# Patient Record
Sex: Male | Born: 1945
Health system: Southern US, Community
[De-identification: ages and names within clinical notes are randomized; demographics above are authoritative.]

## PROBLEM LIST (undated history)

## (undated) DIAGNOSIS — B351 Tinea unguium: Secondary | ICD-10-CM

## (undated) DIAGNOSIS — I10 Essential (primary) hypertension: Secondary | ICD-10-CM

## (undated) DIAGNOSIS — K219 Gastro-esophageal reflux disease without esophagitis: Secondary | ICD-10-CM

## (undated) DIAGNOSIS — Z8719 Personal history of other diseases of the digestive system: Secondary | ICD-10-CM

## (undated) DIAGNOSIS — I499 Cardiac arrhythmia, unspecified: Secondary | ICD-10-CM

## (undated) DIAGNOSIS — H9190 Unspecified hearing loss, unspecified ear: Secondary | ICD-10-CM

## (undated) DIAGNOSIS — H35039 Hypertensive retinopathy, unspecified eye: Secondary | ICD-10-CM

## (undated) DIAGNOSIS — C801 Malignant (primary) neoplasm, unspecified: Secondary | ICD-10-CM

## (undated) DIAGNOSIS — H269 Unspecified cataract: Secondary | ICD-10-CM

## (undated) DIAGNOSIS — B009 Herpesviral infection, unspecified: Secondary | ICD-10-CM

## (undated) DIAGNOSIS — M549 Dorsalgia, unspecified: Secondary | ICD-10-CM

## (undated) DIAGNOSIS — E039 Hypothyroidism, unspecified: Secondary | ICD-10-CM

## (undated) DIAGNOSIS — J189 Pneumonia, unspecified organism: Secondary | ICD-10-CM

## (undated) DIAGNOSIS — E785 Hyperlipidemia, unspecified: Secondary | ICD-10-CM

## (undated) HISTORY — PX: OTHER SURGICAL HISTORY: SHX169

## (undated) HISTORY — PX: COSMETIC SURGERY: SHX468

## (undated) HISTORY — DX: Tinea unguium: B35.1

## (undated) HISTORY — DX: Hypertensive retinopathy, unspecified eye: H35.039

## (undated) HISTORY — DX: Hyperlipidemia, unspecified: E78.5

## (undated) HISTORY — DX: Dorsalgia, unspecified: M54.9

## (undated) HISTORY — PX: LASIK: SHX215

## (undated) HISTORY — PX: EYE SURGERY: SHX253

## (undated) HISTORY — PX: WISDOM TOOTH EXTRACTION: SHX21

## (undated) HISTORY — DX: Essential (primary) hypertension: I10

## (undated) HISTORY — PX: COLONOSCOPY: SHX174

---
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
## 1979-07-06 HISTORY — PX: VASECTOMY: SHX75

## 2005-10-19 ENCOUNTER — Ambulatory Visit: Payer: Self-pay | Admitting: Internal Medicine

## 2007-01-20 DIAGNOSIS — E785 Hyperlipidemia, unspecified: Secondary | ICD-10-CM

## 2007-01-20 DIAGNOSIS — I10 Essential (primary) hypertension: Secondary | ICD-10-CM

## 2007-01-25 ENCOUNTER — Ambulatory Visit: Payer: Self-pay | Admitting: Internal Medicine

## 2007-01-25 LAB — CONVERTED CEMR LAB
Bilirubin, Direct: 0.1 mg/dL (ref 0.0–0.3)
Cholesterol: 230 mg/dL (ref 0–200)
Direct LDL: 149.6 mg/dL
HDL: 43.2 mg/dL (ref >39.0)
Total CHOL/HDL Ratio: 5.3
Total Protein: 7 g/dL (ref 6.0–8.3)
Triglycerides: 148 mg/dL (ref 0–149)

## 2007-02-01 ENCOUNTER — Ambulatory Visit: Payer: Self-pay | Admitting: Internal Medicine

## 2007-02-01 DIAGNOSIS — B351 Tinea unguium: Secondary | ICD-10-CM

## 2007-02-01 DIAGNOSIS — H906 Mixed conductive and sensorineural hearing loss, bilateral: Secondary | ICD-10-CM | POA: Insufficient documentation

## 2007-02-01 DIAGNOSIS — R413 Other amnesia: Secondary | ICD-10-CM | POA: Insufficient documentation

## 2007-02-01 DIAGNOSIS — M549 Dorsalgia, unspecified: Secondary | ICD-10-CM | POA: Insufficient documentation

## 2007-02-22 ENCOUNTER — Encounter: Payer: Self-pay | Admitting: Internal Medicine

## 2007-03-31 ENCOUNTER — Ambulatory Visit: Payer: Self-pay | Admitting: Internal Medicine

## 2007-03-31 LAB — CONVERTED CEMR LAB
Alkaline Phosphatase: 69 units/L (ref 39–117)
BUN: 10 mg/dL (ref 6–23)
Basophils Absolute: 0 10*3/uL (ref 0.0–0.1)
Basophils Relative: 0.3 % (ref 0.0–1.0)
Bilirubin Urine: NEGATIVE
Bilirubin, Direct: 0.1 mg/dL (ref 0.0–0.3)
CO2: 29 meq/L (ref 19–32)
Cholesterol: 220 mg/dL (ref 0–200)
Direct LDL: 130.6 mg/dL
GFR calc Af Amer: 98 mL/min
Glucose, Urine, Semiquant: NEGATIVE
HCT: 43.2 % (ref 39.0–52.0)
Hemoglobin: 15.2 g/dL (ref 13.0–17.0)
Lymphocytes Relative: 25.7 % (ref 12.0–46.0)
MCHC: 35.3 g/dL (ref 30.0–36.0)
Monocytes Absolute: 0.3 10*3/uL (ref 0.2–0.7)
Monocytes Relative: 6.9 % (ref 3.0–11.0)
Neutro Abs: 3.3 10*3/uL (ref 1.4–7.7)
Neutrophils Relative %: 64.5 % (ref 43.0–77.0)
PSA: 1.53 ng/mL (ref 0.10–4.00)
Potassium: 4.5 meq/L (ref 3.5–5.1)
Sodium: 140 meq/L (ref 135–145)
TSH: 1.88 microintl units/mL (ref 0.35–5.50)
Total Bilirubin: 1.2 mg/dL (ref 0.3–1.2)
Total Protein: 6.7 g/dL (ref 6.0–8.3)
pH: 5.5

## 2007-04-04 ENCOUNTER — Ambulatory Visit: Payer: Self-pay | Admitting: Internal Medicine

## 2007-04-12 ENCOUNTER — Ambulatory Visit: Payer: Self-pay | Admitting: Gastroenterology

## 2007-04-26 ENCOUNTER — Ambulatory Visit: Payer: Self-pay | Admitting: Gastroenterology

## 2007-04-26 ENCOUNTER — Encounter: Payer: Self-pay | Admitting: Internal Medicine

## 2007-09-18 ENCOUNTER — Ambulatory Visit: Payer: Self-pay | Admitting: Internal Medicine

## 2007-09-18 LAB — CONVERTED CEMR LAB
ALT: 27 units/L (ref 0–53)
AST: 23 units/L (ref 0–37)
Albumin: 4.4 g/dL (ref 3.5–5.2)
Alkaline Phosphatase: 67 units/L (ref 39–117)
Total Bilirubin: 1.1 mg/dL (ref 0.3–1.2)
Total Protein: 7.2 g/dL (ref 6.0–8.3)
Triglycerides: 122 mg/dL (ref 0–149)

## 2007-10-05 ENCOUNTER — Ambulatory Visit: Payer: Self-pay | Admitting: Internal Medicine

## 2007-10-05 DIAGNOSIS — C44309 Unspecified malignant neoplasm of skin of other parts of face: Secondary | ICD-10-CM | POA: Insufficient documentation

## 2007-10-05 DIAGNOSIS — F528 Other sexual dysfunction not due to a substance or known physiological condition: Secondary | ICD-10-CM

## 2008-01-31 ENCOUNTER — Ambulatory Visit: Payer: Self-pay | Admitting: Internal Medicine

## 2008-01-31 LAB — CONVERTED CEMR LAB
AST: 28 units/L (ref 0–37)
Alkaline Phosphatase: 71 units/L (ref 39–117)
Bilirubin, Direct: 0.2 mg/dL (ref 0.0–0.3)
Total Bilirubin: 1 mg/dL (ref 0.3–1.2)
Total CHOL/HDL Ratio: 4.8
VLDL: 21 mg/dL (ref 0–40)

## 2008-02-14 ENCOUNTER — Ambulatory Visit: Payer: Self-pay | Admitting: Internal Medicine

## 2008-02-14 LAB — CONVERTED CEMR LAB
HDL goal, serum: 40 mg/dL
LDL Goal: 100 mg/dL

## 2008-04-25 ENCOUNTER — Encounter: Payer: Self-pay | Admitting: Internal Medicine

## 2008-05-20 ENCOUNTER — Ambulatory Visit: Payer: Self-pay | Admitting: Internal Medicine

## 2008-05-20 LAB — CONVERTED CEMR LAB
ALT: 33 units/L (ref 0–53)
AST: 30 units/L (ref 0–37)
Cholesterol: 184 mg/dL (ref 0–200)
HDL: 48.4 mg/dL (ref >39.0)
Total Bilirubin: 0.8 mg/dL (ref 0.3–1.2)
Total Protein: 6.9 g/dL (ref 6.0–8.3)
VLDL: 7 mg/dL (ref 0–40)

## 2008-05-27 ENCOUNTER — Ambulatory Visit: Payer: Self-pay | Admitting: Internal Medicine

## 2008-05-27 DIAGNOSIS — E039 Hypothyroidism, unspecified: Secondary | ICD-10-CM | POA: Insufficient documentation

## 2008-11-06 ENCOUNTER — Telehealth: Payer: Self-pay | Admitting: Internal Medicine

## 2008-11-08 ENCOUNTER — Telehealth: Payer: Self-pay | Admitting: Internal Medicine

## 2008-11-19 ENCOUNTER — Ambulatory Visit: Payer: Self-pay | Admitting: Internal Medicine

## 2008-11-19 LAB — CONVERTED CEMR LAB
ALT: 47 units/L (ref 0–53)
Albumin: 4 g/dL (ref 3.5–5.2)
Basophils Relative: 0.5 % (ref 0.0–3.0)
Bilirubin Urine: NEGATIVE
Bilirubin, Direct: 0 mg/dL (ref 0.0–0.3)
CO2: 28 meq/L (ref 19–32)
Chloride: 104 meq/L (ref 96–112)
Cholesterol: 220 mg/dL — ABNORMAL HIGH (ref 0–200)
Eosinophils Absolute: 0 10*3/uL (ref 0.0–0.7)
Eosinophils Relative: 1 % (ref 0.0–5.0)
Glucose, Urine, Semiquant: NEGATIVE
HCT: 48.2 % (ref 39.0–52.0)
Lymphs Abs: 1.4 10*3/uL (ref 0.7–4.0)
MCHC: 34.5 g/dL (ref 30.0–36.0)
MCV: 86 fL (ref 78.0–100.0)
Monocytes Absolute: 0.3 10*3/uL (ref 0.1–1.0)
Neutro Abs: 3.2 10*3/uL (ref 1.4–7.7)
PSA: 2.06 ng/mL (ref 0.10–4.00)
Potassium: 4.1 meq/L (ref 3.5–5.1)
Protein, U semiquant: NEGATIVE
RBC: 5.6 M/uL (ref 4.22–5.81)
TSH: 0.01 microintl units/mL — ABNORMAL LOW (ref 0.35–5.50)
Total CHOL/HDL Ratio: 4
Total Protein: 7.3 g/dL (ref 6.0–8.3)
Urobilinogen, UA: 0.2
VLDL: 29 mg/dL (ref 0.0–40.0)
WBC Urine, dipstick: NEGATIVE
WBC: 4.9 10*3/uL (ref 4.5–10.5)
pH: 7

## 2008-11-25 ENCOUNTER — Ambulatory Visit: Payer: Self-pay | Admitting: Internal Medicine

## 2008-12-25 ENCOUNTER — Encounter: Payer: Self-pay | Admitting: Internal Medicine

## 2008-12-25 ENCOUNTER — Ambulatory Visit: Payer: Self-pay | Admitting: Internal Medicine

## 2008-12-25 DIAGNOSIS — C44599 Other specified malignant neoplasm of skin of other part of trunk: Secondary | ICD-10-CM

## 2009-01-02 ENCOUNTER — Encounter: Payer: Self-pay | Admitting: Internal Medicine

## 2009-01-02 ENCOUNTER — Ambulatory Visit: Payer: Self-pay | Admitting: Internal Medicine

## 2009-10-10 ENCOUNTER — Telehealth: Payer: Self-pay | Admitting: Internal Medicine

## 2009-10-14 ENCOUNTER — Telehealth: Payer: Self-pay | Admitting: Internal Medicine

## 2009-10-30 LAB — CONVERTED CEMR LAB
HDL: 54 mg/dL
LDL Cholesterol: 128 mg/dL
Triglycerides: 86 mg/dL

## 2010-03-23 ENCOUNTER — Telehealth: Payer: Self-pay | Admitting: Internal Medicine

## 2010-03-30 ENCOUNTER — Ambulatory Visit: Payer: Self-pay | Admitting: Internal Medicine

## 2010-03-30 LAB — CONVERTED CEMR LAB
ALT: 35 units/L (ref 0–53)
Alkaline Phosphatase: 83 units/L (ref 39–117)
Bilirubin, Direct: 0.2 mg/dL (ref 0.0–0.3)
Direct LDL: 120.6 mg/dL
HDL: 59.7 mg/dL (ref >39.00)
Total Bilirubin: 1.4 mg/dL — ABNORMAL HIGH (ref 0.3–1.2)

## 2010-04-06 ENCOUNTER — Ambulatory Visit: Payer: Self-pay | Admitting: Internal Medicine

## 2010-07-07 ENCOUNTER — Encounter: Payer: Self-pay | Admitting: Internal Medicine

## 2010-07-07 ENCOUNTER — Ambulatory Visit
Admission: RE | Admit: 2010-07-07 | Discharge: 2010-07-07 | Payer: Self-pay | Source: Home / Self Care | Attending: Internal Medicine | Admitting: Internal Medicine

## 2010-07-07 LAB — CONVERTED CEMR LAB
Sex Hormone Binding: 58 nmol/L (ref 13–71)
Testosterone Free: 138.5 pg/mL (ref 47.0–244.0)
Testosterone-% Free: 1.6 % (ref 1.6–2.9)
Testosterone: 867.6 ng/dL (ref 250–890)
Vit D, 25-Hydroxy: 90 ng/mL — ABNORMAL HIGH (ref 30–89)

## 2010-07-13 ENCOUNTER — Ambulatory Visit
Admission: RE | Admit: 2010-07-13 | Discharge: 2010-07-13 | Payer: Self-pay | Source: Home / Self Care | Attending: Internal Medicine | Admitting: Internal Medicine

## 2010-07-13 DIAGNOSIS — R972 Elevated prostate specific antigen [PSA]: Secondary | ICD-10-CM | POA: Insufficient documentation

## 2010-08-03 ENCOUNTER — Other Ambulatory Visit: Payer: Self-pay | Admitting: Internal Medicine

## 2010-08-03 ENCOUNTER — Encounter: Payer: Self-pay | Admitting: Internal Medicine

## 2010-08-03 ENCOUNTER — Ambulatory Visit
Admission: RE | Admit: 2010-08-03 | Discharge: 2010-08-03 | Payer: Self-pay | Source: Home / Self Care | Attending: Internal Medicine | Admitting: Internal Medicine

## 2010-08-04 NOTE — Assessment & Plan Note (Signed)
Summary: F/U ON LABWORK // RS   Vital Signs:  Patient profile:   65 year old male Weight:      171 pounds BMI:     24.62 Temp:     97.9 degrees F oral Pulse rate:   74 / minute Pulse rhythm:   regular Resp:     12 per minute BP sitting:   138 / 78  Vitals Entered By: Lynann Beaver CMA (April 06, 2010 3:16 PM) CC: f/o labs, Lipid Management   Primary Care Provider:  Stacie Glaze MD  CC:  f/o labs and Lipid Management.  History of Present Illness: follow up on the lipids follow up on the testosteron levels and the supplimentation  wit the ciprionate added hearing aids  Hyperlipidemia Follow-Up      This is a 65 year old man who presents for Hyperlipidemia follow-up.  The patient denies muscle aches, GI upset, abdominal pain, flushing, itching, constipation, diarrhea, and fatigue.  The patient denies the following symptoms: chest pain/pressure, exercise intolerance, dypsnea, palpitations, syncope, and pedal edema.  Compliance with medications (by patient report) has been intermittent.  Dietary compliance has been good.  The patient reports exercising 3-4X per week.  Adjunctive measures currently used by the patient include fish oil supplements and weight reduction.    Lipid Management History:      Positive NCEP/ATP III risk factors include male age 50 years old or older and hypertension.  Negative NCEP/ATP III risk factors include no family history for ischemic heart disease, non-tobacco-user status, no ASHD (atherosclerotic heart disease), no prior stroke/TIA, no peripheral vascular disease, and no history of aortic aneurysm.     Preventive Screening-Counseling & Management  Alcohol-Tobacco     Smoking Status: never     Tobacco Counseling: not indicated; no tobacco use  Problems Prior to Update: 1)  Other Malig Neoplasm Skin Trunk Except Scrotum  (ICD-173.5) 2)  Hypothyroidism, Borderline  (ICD-244.9) 3)  Skin Tag  (ICD-701.9) 4)  Carcinoma, Basal Cell, Nose   (ICD-173.3) 5)  Erectile Dysfunction, Mild  (ICD-302.72) 6)  Preventive Health Care  (ICD-V70.0) 7)  Loss, Mixed Hearing, Bilateral  (ICD-389.22) 8)  Dermatophytosis, Nail  (ICD-110.1) 9)  Symptom, Memory Loss  (ICD-780.93) 10)  Back Pain  (ICD-724.5) 11)  Hypertension  (ICD-401.9) 12)  Hyperlipidemia  (ICD-272.4)  Medications Prior to Update: 1)  Co Q 10 10 Mg  Caps (Coenzyme Q10) .... Once Daily 2)  Zinc 50 Mg  Caps (Zinc) .... Once Daily 3)  Vitamin B-12 Cr 1500 Mcg  Tbcr (Cyanocobalamin) .... Once Daily 4)  Vitamin C 1000 Mg  Tabs (Ascorbic Acid) .... Once Daily 5)  Omega 3 1500 .... Two Times A Day 6)  Niacin Cr 1000 Mg  Tbcr (Niacin) .... Once Daily 7)  Red Yeast Rice Extract 600 Mg  Caps (Red Yeast Rice Extract) .... Once Daily 8)  Vitamin D 1000 Unit  Caps (Cholecalciferol) .... 2 Two Times A Day 9)  Glucosamine Complex 200-300 Mg  Tabs (Glucosamine Hcl-Glucosamin So4) .... Two Times A Day 10)  Vitamin E 400 Unit  Caps (Vitamin E) .... Once Daily 11)  Lovaza 1 Gm  Caps (Omega-3-Acid Ethyl Esters) .... Two Twice By Mouth Bid 12)  Hydrocortisone 5 Mg Tabs (Hydrocortisone) .... 2 Two Times A Day 13)  T-86mcg .Marland Kitchen.. 1 3 Times A Week 14)  T 4:t3 2grain 76/18 Mcg .Marland Kitchen.. 3 Times A Week 15)  Dhea 1500 Mg 1 Qd 16)  Arimidex 1 Mg  Tabs (Anastrozole) .... 1/2 3 Times A Week 17)  Testosterone .25 Cc 2 Times A Week 18)  Amitriptyline Hcl 10 Mg Tabs (Amitriptyline Hcl) .... Q Hs As Needed Sleep  Current Medications (verified): 1)  Co Q 10 10 Mg  Caps (Coenzyme Q10) .... Once Daily 2)  Zinc 50 Mg  Caps (Zinc) .... Once Daily 3)  Vitamin B-12 Cr 1500 Mcg  Tbcr (Cyanocobalamin) .... Once Daily 4)  Vitamin C 1000 Mg  Tabs (Ascorbic Acid) .... Once Daily 5)  Red Yeast Rice Extract 600 Mg  Caps (Red Yeast Rice Extract) .... Once Daily 6)  Vitamin D 1000 Unit  Caps (Cholecalciferol) .... 2 Two Times A Day 7)  Vitamin E 400 Unit  Caps (Vitamin E) .... Once Daily 8)  Lovaza 1 Gm  Caps (Omega-3-Acid  Ethyl Esters) .... Two Twice By Mouth Bid 9)  Dhea 1500 Mg 1 Qd 10)  Arimidex 1 Mg Tabs (Anastrozole) .... 1/2 Po 3 Times A Week 11)  Testosterone Cypionate 200 Mg/ml Oil (Testosterone Cypionate) .... 1/4 Cc Every Week 12)  Armour Thyroid 60 Mg Tabs (Thyroid) .... One By Mouth Q Day 13)  Milk Thistle 150 Mg Caps (Milk Thistle) .... 375 Mg Q Day  Allergies (verified): No Known Drug Allergies  Past History:  Family History: Last updated: 02/01/2007 father Family History Liver disease secondary to ETOH  mother CHF  Social History: Last updated: 02/01/2007 Married Former Smoker quit in 1983  Risk Factors: Smoking Status: never (04/06/2010) Passive Smoke Exposure: no (04/04/2007)  Past medical, surgical, family and social histories (including risk factors) reviewed, and no changes noted (except as noted below).  Past Medical History: Reviewed history from 02/01/2007 and no changes required. Hyperlipidemia Hypertension back pain fungal nail  Past Surgical History: Reviewed history from 02/01/2007 and no changes required. ankle surgery trauma eye lid surgery  Family History: Reviewed history from 02/01/2007 and no changes required. father Family History Liver disease secondary to ETOH  mother CHF  Social History: Reviewed history from 02/01/2007 and no changes required. Married Former Smoker quit in 1983Smoking Status:  never  Review of Systems  The patient denies anorexia, fever, weight loss, weight gain, vision loss, decreased hearing, hoarseness, chest pain, syncope, dyspnea on exertion, peripheral edema, prolonged cough, headaches, hemoptysis, abdominal pain, melena, hematochezia, severe indigestion/heartburn, hematuria, incontinence, genital sores, muscle weakness, suspicious skin lesions, transient blindness, difficulty walking, depression, unusual weight change, abnormal bleeding, enlarged lymph nodes, angioedema, and breast masses.    Physical  Exam  General:  Well-developed,well-nourished,in no acute distress; alert,appropriate and cooperative throughout examination Head:  Normocephalic and atraumatic without obvious abnormalities. No apparent alopecia or balding. Eyes:  pupils equal and pupils round.   Ears:  R ear normal and L ear normal.   Nose:  no external deformity.   Neck:  supple and full ROM.   Lungs:  normal respiratory effort and no wheezes.   Heart:  normal rate and regular rhythm.   Abdomen:  soft and non-tender.   Msk:  no joint tenderness and no joint swelling.   Extremities:  No clubbing, cyanosis, edema, or deformity noted with normal full range of motion of all joints.   Neurologic:  alert & oriented X3 and DTRs symmetrical and normal.     Impression & Recommendations:  Problem # 1:  HYPERTENSION (ICD-401.9) Assessment Unchanged  BP today: 138/78 Prior BP: 130/80 (01/02/2009)  10 Yr Risk Heart Disease: 14 % Prior 10 Yr Risk Heart Disease: Not enough information (02/14/2008)  Labs Reviewed: K+: 4.1 (11/19/2008) Creat: : 1.0 (11/19/2008)   Chol: 207 (03/30/2010)   HDL: 59.70 (03/30/2010)   LDL: 128 (10/30/2009)   TG: 96.0 (03/30/2010)  Problem # 2:  HYPERLIPIDEMIA (ICD-272.4) Assessment: Deteriorated not following plan adding fish oil  and discussed dash diet The following medications were removed from the medication list:    Niacin Cr 1000 Mg Tbcr (Niacin) ..... Once daily His updated medication list for this problem includes:    Lovaza 1 Gm Caps (Omega-3-acid ethyl esters) .Marland Kitchen..Marland Kitchen Two twice by mouth bid  Labs Reviewed: SGOT: 26 (03/30/2010)   SGPT: 35 (03/30/2010)  Lipid Goals: Chol Goal: 200 (02/14/2008)   HDL Goal: 40 (02/14/2008)   LDL Goal: 100 (02/14/2008)   TG Goal: 150 (02/14/2008)  10 Yr Risk Heart Disease: 14 % Prior 10 Yr Risk Heart Disease: Not enough information (02/14/2008)   HDL:59.70 (03/30/2010), 54 (10/30/2009)  LDL:128 (10/30/2009), 128 (05/20/2008)  Chol:207 (03/30/2010),  205 (10/30/2009)  Trig:96.0 (03/30/2010), 86 (10/30/2009)  Problem # 3:  ERECTILE DYSFUNCTION, MILD (ICD-302.72)  Discussed proper use of medications, as well as side effects.   Problem # 4:  HYPOTHYROIDISM, BORDERLINE (ICD-244.9) refill His updated medication list for this problem includes:    Armour Thyroid 60 Mg Tabs (Thyroid) ..... One by mouth q day  Labs Reviewed: TSH: 1.10 (03/30/2010)    Chol: 207 (03/30/2010)   HDL: 59.70 (03/30/2010)   LDL: 128 (10/30/2009)   TG: 96.0 (03/30/2010)  Complete Medication List: 1)  Co Q 10 10 Mg Caps (Coenzyme q10) .... Once daily 2)  Zinc 50 Mg Caps (Zinc) .... Once daily 3)  Vitamin B-12 Cr 1500 Mcg Tbcr (Cyanocobalamin) .... Once daily 4)  Vitamin C 1000 Mg Tabs (Ascorbic acid) .... Once daily 5)  Red Yeast Rice Extract 600 Mg Caps (Red yeast rice extract) .... Once daily 6)  Vitamin D 1000 Unit Caps (Cholecalciferol) .... 2 two times a day 7)  Vitamin E 400 Unit Caps (Vitamin e) .... Once daily 8)  Lovaza 1 Gm Caps (Omega-3-acid ethyl esters) .... Two twice by mouth bid 9)  Dhea 1500 Mg 1 Qd  10)  Arimidex 1 Mg Tabs (Anastrozole) .... 1/2 po 3 times a week 11)  Testosterone Cypionate 200 Mg/ml Oil (Testosterone cypionate) .... 1/4 cc every week 12)  Armour Thyroid 60 Mg Tabs (Thyroid) .... One by mouth q day 13)  Milk Thistle 150 Mg Caps (Milk thistle) .... 375 mg q day  Lipid Assessment/Plan:      Based on NCEP/ATP III, the patient's risk factor category is "0-1 risk factors".  The patient's lipid goals are as follows: Total cholesterol goal is 200; LDL cholesterol goal is 100; HDL cholesterol goal is 40; Triglyceride goal is 150.    Patient Instructions: 1)  Please schedule a follow-up appointment in 3 months. 2)  Testosterone and free testosterone 3)  estrogen       607.84 4)  PSA  790.4 5)  vit D levels  733.00 Prescriptions: ARMOUR THYROID 60 MG TABS (THYROID) ac qid  #30 x 11   Entered and Authorized by:   Stacie Glaze MD    Signed by:   Stacie Glaze MD on 04/06/2010   Method used:   Elect[facility] to        [facility]  [address] [phone]* (retail)       [address]       [place], [place]  [address]  Ph: [phone] or [phone]       Fax: [phone]   RxID:   [id number] ARIMIDEX 1 MG TABS (ANASTROZOLE) 1/2 po 3 times a week  #12 x 3   Entered and Authorized by:   Stacie Glaze MD   Signed by:   Stacie Glaze MD on 04/06/2010   Method used:   [facility] to        [facility]  [address] [phone][facility] (retail)       [address] [place], [place]  [address]       Ph: [phone] or [phone]       Fax: [phone]   RxID:   [id number] TESTOSTERONE CYPIONATE 200 MG/ML OIL (TESTOSTERONE CYPIONATE) 1/4 cc every week  #10cc x 6   Entered and Authorized by:   Stacie Glaze MD   Signed by:   Stacie Glaze MD on 04/06/2010   Method used:   Print then Give to Patient   RxID:   [id number]

## 2010-08-04 NOTE — Progress Notes (Signed)
Summary: Pt is req to get Liver,Lipids and Thyroid checked  Phone Note Call from Patient Call back at Home Phone [phone]   Caller: Patient Reason for Call: Acute Illness Summary of Call: Pt called and said that he needs to sch labs to get liver and lipids recheck and ? thyroid. Pls advise.  Initial call taken by: Lucy Antigua,  March 23, 2010 9:14 AM  Follow-up for Phone Call        lipid,liver 272.4 and tsh 244.9 may be ordered and ov 1 week later  Follow-up by: Willy Eddy, LPN,  March 23, 2010 9:33 AM  Additional Follow-up for Phone Call Additional follow up Details #1::        Contacted pt and scheduled appt for labwork on  9/26  and then f/u with Dr Lovell Sheehan on  10/3.  Additional Follow-up by: Debbra Riding,  March 23, 2010 10:20 AM

## 2010-08-04 NOTE — Letter (Signed)
Summary: [facility]  [facility]   Imported By: Maryln Gottron 04/02/2010 14:45:40  _____________________________________________________________________  External Attachment:    Type:   Image     Comment:   External Document

## 2010-08-04 NOTE — Progress Notes (Signed)
Summary: lovaza  Phone Note Refill Request Message from:  Fax from Pharmacy on October 10, 2009 3:33 PM  Refills Requested: Medication #1:  LOVAZA 1 GM  CAPS two twice by mouth BID Initial call taken by: Kern Reap CMA Duncan Dull),  October 10, 2009 3:34 PM    Prescriptions: LOVAZA 1 GM  CAPS (OMEGA-3-ACID ETHYL ESTERS) two twice by mouth BID  #120 x 11   Entered by:   Kern Reap CMA (AAMA)   Authorized by:   Stacie Glaze MD   Signed by:   Kern Reap CMA (AAMA) on 10/10/2009   Method used:   Electronically to        [facility]* (mail-order)             ,          Ph: [phone]       Fax: [phone]   RxID:   [id number]

## 2010-08-04 NOTE — Progress Notes (Signed)
Summary: REQ FOR RX TO BE RESENT TO [facility] (LOVAZA)  Phone Note Call from Patient   Caller: Spouse  [phone] Reason for Call: Refill Medication Summary of Call: Pts wife [name]) called to adv that [facility] is telling them that they have not received refill Rx for med: LOVAZA 1 GM  ..... EMR is showing same was sent on 10/10/2009.... Pts wife req that Rx be resent to [facility].  Pts wife can be reached at [phone] with any questions or concerns.  Initial call taken by: Debbra Riding,  October 14, 2009 10:03 AM    Prescriptions: LOVAZA 1 GM  CAPS (OMEGA-3-ACID ETHYL ESTERS) two twice by mouth BID  #120 x 11   Entered by:   Willy Eddy, LPN   Authorized by:   Stacie Glaze MD   Signed by:   Willy Eddy, LPN on 29/56/2130   Method used:   Electronically to        [facility]* (mail-order)             ,          Ph: [phone]       Fax: [phone]   RxID:   [id number]   Appended Document: REQ FOR RX TO BE RESENT TO [facility] (LOVAZA) sent again

## 2010-08-06 NOTE — Assessment & Plan Note (Signed)
Summary: 3 month follow up/cjr   Vital Signs:  Patient profile:   65 year old male Height:      70 inches Weight:      171 pounds BMI:     24.62 Temp:     98.3 degrees F oral Pulse rate:   84 / minute Resp:     14 per minute BP sitting:   144 / 80  (left arm)  Vitals Entered By: Willy Eddy, LPN (July 13, 2010 10:40 AM) CC: elevated liver enzymeroa labs, Lipid Management Is Patient Diabetic? No   Primary Care Provider:  Stacie Glaze MD  CC:  elevated liver enzymeroa labs and Lipid Management.  History of Present Illness: monitering of PSA with hx of prostate cancer  ( see urology note) Note he is on testosterone HTN controlled long discussion with pt ans wife plan for monitering psa and risks of CA hyperlipidemia stable no fm hx of prostate ca  Lipid Management History:      Positive NCEP/ATP III risk factors include male age 36 years old or older and hypertension.  Negative NCEP/ATP III risk factors include no family history for ischemic heart disease, non-tobacco-user status, no ASHD (atherosclerotic heart disease), no prior stroke/TIA, no peripheral vascular disease, and no history of aortic aneurysm.    Preventive Screening-Counseling & Management  Alcohol-Tobacco     Smoking Status: never     Passive Smoke Exposure: no     Tobacco Counseling: not indicated; no tobacco use  Problems Prior to Update: 1)  Prostate Specific Antigen, Elevated  (ICD-790.93) 2)  Other Malig Neoplasm Skin Trunk Except Scrotum  (ICD-173.5) 3)  Hypothyroidism, Borderline  (ICD-244.9) 4)  Skin Tag  (ICD-701.9) 5)  Carcinoma, Basal Cell, Nose  (ICD-173.3) 6)  Erectile Dysfunction, Mild  (ICD-302.72) 7)  Preventive Health Care  (ICD-V70.0) 8)  Loss, Mixed Hearing, Bilateral  (ICD-389.22) 9)  Dermatophytosis, Nail  (ICD-110.1) 10)  Symptom, Memory Loss  (ICD-780.93) 11)  Back Pain  (ICD-724.5) 12)  Hypertension  (ICD-401.9) 13)  Hyperlipidemia  (ICD-272.4)  Current Problems  (verified): 1)  Other Malig Neoplasm Skin Trunk Except Scrotum  (ICD-173.5) 2)  Hypothyroidism, Borderline  (ICD-244.9) 3)  Skin Tag  (ICD-701.9) 4)  Carcinoma, Basal Cell, Nose  (ICD-173.3) 5)  Erectile Dysfunction, Mild  (ICD-302.72) 6)  Preventive Health Care  (ICD-V70.0) 7)  Loss, Mixed Hearing, Bilateral  (ICD-389.22) 8)  Dermatophytosis, Nail  (ICD-110.1) 9)  Symptom, Memory Loss  (ICD-780.93) 10)  Back Pain  (ICD-724.5) 11)  Hypertension  (ICD-401.9) 12)  Hyperlipidemia  (ICD-272.4)  Medications Prior to Update: 1)  Co Q 10 10 Mg  Caps (Coenzyme Q10) .... Once Daily 2)  Zinc 50 Mg  Caps (Zinc) .... Once Daily 3)  Vitamin B-12 Cr 1500 Mcg  Tbcr (Cyanocobalamin) .... Once Daily 4)  Vitamin C 1000 Mg  Tabs (Ascorbic Acid) .... Once Daily 5)  Red Yeast Rice Extract 600 Mg  Caps (Red Yeast Rice Extract) .... Once Daily 6)  Vitamin D 1000 Unit  Caps (Cholecalciferol) .... 2 Two Times A Day 7)  Vitamin E 400 Unit  Caps (Vitamin E) .... Once Daily 8)  Lovaza 1 Gm  Caps (Omega-3-Acid Ethyl Esters) .... Two Twice By Mouth Bid 9)  Dhea 1500 Mg 1 Qd 10)  Arimidex 1 Mg Tabs (Anastrozole) .... 1/2 Po 3 Times A Week 11)  Testosterone Cypionate 200 Mg/ml Oil (Testosterone Cypionate) .... 1/4 Cc Every Week 12)  Armour Thyroid 60 Mg Tabs (  Thyroid) .... One By Mouth Q Day 13)  Milk Thistle 150 Mg Caps (Milk Thistle) .... 375 Mg Q Day  Current Medications (verified): 1)  Co Q 10 10 Mg  Caps (Coenzyme Q10) .... Once Daily 2)  Zinc 50 Mg  Caps (Zinc) .... Once Daily 3)  Vitamin B-12 Cr 1500 Mcg  Tbcr (Cyanocobalamin) .... Once Daily 4)  Vitamin C 1000 Mg  Tabs (Ascorbic Acid) .... Once Daily 5)  Red Yeast Rice Extract 600 Mg  Caps (Red Yeast Rice Extract) .... Once Daily 6)  Vitamin D 1000 Unit  Caps (Cholecalciferol) .... 2 Two Times A Day 7)  Vitamin E 400 Unit  Caps (Vitamin E) .... Once Daily 8)  Lovaza 1 Gm  Caps (Omega-3-Acid Ethyl Esters) .... Two Twice By Mouth Bid 9)  Dhea 25 Mg Tabs  (Prasterone (Dhea)) .Marland Kitchen.. 1 Once Daily 10)  Arimidex 1 Mg Tabs (Anastrozole) .... 1/2 Po Weekly 11)  Testosterone Cypionate 200 Mg/ml Oil (Testosterone Cypionate) .... 1/4 Cc Every Week 12)  Armour Thyroid 60 Mg Tabs (Thyroid) .... One By Mouth Q Day 13)  Milk Thistle 150 Mg Caps (Milk Thistle) .... 375 Mg Q Day 14)  Magnesium Oxide 400 Mg Caps (Magnesium Oxide) .Marland Kitchen.. 1 Once Daily 15)  [place] [address]80 .Marland Kitchen.. 1 Two Times A Day 16)  Ciprofloxacin Hcl 250 Mg Tabs (Ciprofloxacin Hcl) .... One By Mouth Two Times A Day For 21 Days  Allergies (verified): No Known Drug Allergies  Past History:  Family History: Last updated: 02/01/2007 father Family History Liver disease secondary to ETOH  mother CHF  Social History: Last updated: 02/01/2007 Married Former Smoker quit in 1983  Risk Factors: Smoking Status: never (07/13/2010) Passive Smoke Exposure: no (07/13/2010)  Past medical, surgical, family and social histories (including risk factors) reviewed, and no changes noted (except as noted below).  Past Medical History: Reviewed history from 02/01/2007 and no changes required. Hyperlipidemia Hypertension back pain fungal nail  Past Surgical History: Reviewed history from 02/01/2007 and no changes required. ankle surgery trauma eye lid surgery  Family History: Reviewed history from 02/01/2007 and no changes required. father Family History Liver disease secondary to ETOH  mother CHF  Social History: Reviewed history from 02/01/2007 and no changes required. Married Former Smoker quit in 1983  Review of Systems  The patient denies anorexia, fever, weight loss, weight gain, vision loss, decreased hearing, hoarseness, chest pain, syncope, dyspnea on exertion, peripheral edema, prolonged cough, headaches, hemoptysis, abdominal pain, melena, hematochezia, severe indigestion/heartburn, hematuria, incontinence, genital sores, muscle weakness, suspicious skin lesions, transient  blindness, difficulty walking, depression, unusual weight change, abnormal bleeding, enlarged lymph nodes, angioedema, and breast masses.    Physical Exam  General:  Well-developed,well-nourished,in no acute distress; alert,appropriate and cooperative throughout examination Eyes:  pupils equal and pupils round.   Ears:  R ear normal and L ear normal.   Nose:  no external deformity.   Neck:  supple and full ROM.   Lungs:  normal respiratory effort and no wheezes.   Heart:  normal rate and regular rhythm.   Abdomen:  soft and non-tender.   Msk:  no joint tenderness and no joint swelling.   Extremities:  No clubbing, cyanosis, edema, or deformity noted with normal full range of motion of all joints.   Neurologic:  alert & oriented X3 and DTRs symmetrical and normal.     Impression & Recommendations:  Problem # 1:  ERECTILE DYSFUNCTION, MILD (ICD-302.72)  reduce the testosterone to  Discussed proper  use of medications, as well as side effects.   Problem # 2:  HYPERTENSION (ICD-401.9)  BP today: 144/80 Prior BP: 138/78 (04/06/2010)  10 Yr Risk Heart Disease: 18 % Prior 10 Yr Risk Heart Disease: 14 % (04/06/2010)  Labs Reviewed: K+: 4.1 (11/19/2008) Creat: : 1.0 (11/19/2008)   Chol: 207 (03/30/2010)   HDL: 59.70 (03/30/2010)   LDL: 128 (10/30/2009)   TG: 96.0 (03/30/2010)  Problem # 3:  PROSTATE SPECIFIC ANTIGEN, ELEVATED (ICD-790.93) elevated PSA treated with abx and moniter in 3 weeks with free PSA  Problem # 4:  HYPERTENSION (ICD-401.9)  BP today: 144/80 Prior BP: 138/78 (04/06/2010)  10 Yr Risk Heart Disease: 18 % Prior 10 Yr Risk Heart Disease: 14 % (04/06/2010)  Labs Reviewed: K+: 4.1 (11/19/2008) Creat: : 1.0 (11/19/2008)   Chol: 207 (03/30/2010)   HDL: 59.70 (03/30/2010)   LDL: 128 (10/30/2009)   TG: 96.0 (03/30/2010)  Complete Medication List: 1)  Co Q 10 10 Mg Caps (Coenzyme q10) .... Once daily 2)  Zinc 50 Mg Caps (Zinc) .... Once daily 3)  Vitamin B-12 Cr  1500 Mcg Tbcr (Cyanocobalamin) .... Once daily 4)  Vitamin C 1000 Mg Tabs (Ascorbic acid) .... Once daily 5)  Red Yeast Rice Extract 600 Mg Caps (Red yeast rice extract) .... Once daily 6)  Vitamin D 1000 Unit Caps (Cholecalciferol) .... 2 two times a day 7)  Vitamin E 400 Unit Caps (Vitamin e) .... Once daily 8)  Lovaza 1 Gm Caps (Omega-3-acid ethyl esters) .... Two twice by mouth bid 9)  Dhea 25 Mg Tabs (Prasterone (dhea)) .Marland Kitchen.. 1 once daily 10)  Arimidex 1 Mg Tabs (Anastrozole) .... 1/2 po weekly 11)  Testosterone Cypionate 200 Mg/ml Oil (Testosterone cypionate) .... 1/4 cc every week 12)  Armour Thyroid 60 Mg Tabs (Thyroid) .... One by mouth q day 13)  Milk Thistle 150 Mg Caps (Milk thistle) .... 375 mg q day 14)  Magnesium Oxide 400 Mg Caps (Magnesium oxide) .Marland Kitchen.. 1 once daily 15)  [place] 980  .Marland Kitchen.. 1 two times a day 16)  Ciprofloxacin Hcl 250 Mg Tabs (Ciprofloxacin hcl) .... One by mouth two times a day for 21 days  Lipid Assessment/Plan:      Based on NCEP/ATP III, the patient's risk factor category is "0-1 risk factors".  The patient's lipid goals are as follows: Total cholesterol goal is 200; LDL cholesterol goal is 100; HDL cholesterol goal is 40; Triglyceride goal is 150.     Patient Instructions: 1)  lab work in 3 weeks 2)  PSA and free PSA prior to visit, ICD-9: 9790.93 3)  Please schedule a follow-up appointment in 3 months. 4)  testosterone 607.84 Prescriptions: CIPROFLOXACIN HCL 250 MG TABS (CIPROFLOXACIN HCL) one by mouth two times a day for 21 days  #42 x 0   Entered and Authorized by:   Stacie Glaze MD   Signed by:   Stacie Glaze MD on 07/13/2010   Method used:   [facility] to        [facility]  [address] [phone]* (retail)       [address] [place], [place]  [address]       Ph: [phone] or [phone]       Fax: [phone]   RxID:   [id number]    Orders Added: 1)  Est. Patient Level IV [34742]

## 2010-10-13 ENCOUNTER — Ambulatory Visit: Payer: Self-pay | Admitting: Internal Medicine

## 2010-10-23 ENCOUNTER — Other Ambulatory Visit: Payer: Self-pay | Admitting: Internal Medicine

## 2010-10-28 ENCOUNTER — Encounter: Payer: Self-pay | Admitting: Internal Medicine

## 2010-11-06 ENCOUNTER — Encounter: Payer: Self-pay | Admitting: Internal Medicine

## 2010-11-06 ENCOUNTER — Ambulatory Visit (INDEPENDENT_AMBULATORY_CARE_PROVIDER_SITE_OTHER): Payer: BC Managed Care – PPO | Admitting: Internal Medicine

## 2010-11-06 DIAGNOSIS — E785 Hyperlipidemia, unspecified: Secondary | ICD-10-CM

## 2010-11-06 DIAGNOSIS — E039 Hypothyroidism, unspecified: Secondary | ICD-10-CM

## 2010-11-06 DIAGNOSIS — I10 Essential (primary) hypertension: Secondary | ICD-10-CM

## 2010-11-06 DIAGNOSIS — B009 Herpesviral infection, unspecified: Secondary | ICD-10-CM

## 2010-11-06 MED ORDER — ACYCLOVIR 5 % EX OINT: TOPICAL_OINTMENT | CUTANEOUS | Status: DC

## 2010-11-06 NOTE — Progress Notes (Signed)
  Subjective:    Patient ID: Ryan [name], male    DOB: [birth year], 65 y.o.   MRN: [id number]  HPI he presents with record of blood pressure for the past 30 days with excellent blood pressure control average blood pressure appears to be around 120 to systolic 70 diastolic Patient also has herpes and has and hasn't fortunately infected his partner.  We talked about use of his prophylactic medication on a consistent basis to prevent transfer His mild persistent erectile dysfunction treated with ED drug  He is compliant with his cholesterol medication.  He has been taking a supplement made from olive leafs to control his blood pressure   Review of Systems  Constitutional: Negative for fever and fatigue.  HENT: Negative for hearing loss, congestion, neck pain and postnasal drip.   Eyes: Negative for discharge, redness and visual disturbance.  Respiratory: Negative for cough, shortness of breath and wheezing.   Cardiovascular: Negative for leg swelling.  Gastrointestinal: Negative for abdominal pain, constipation and abdominal distention.  Genitourinary: Negative for urgency and frequency.  Musculoskeletal: Negative for joint swelling and arthralgias.  Skin: Negative for color change and rash.  Neurological: Negative for weakness and light-headedness.  Hematological: Negative for adenopathy.  Psychiatric/Behavioral: Negative for behavioral problems.       Objective:   Physical Exam  Constitutional: He appears well-developed and well-nourished.  HENT:  Head: Normocephalic and atraumatic.  Eyes: Conjunctivae are normal. Pupils are equal, round, and reactive to light.  Neck: Normal range of motion. Neck supple.  Cardiovascular: Normal rate and regular rhythm.   Pulmonary/Chest: Effort normal and breath sounds normal.  Abdominal: Soft. Bowel sounds are normal.          Assessment & Plan:  Blood pressure control by homeopathic techniques he brings with him a record of all of his  blood pressures for the past 30 days showing good blood pressure control with diet exercise and the use of a supplement We discussed transfer of herpes to his partner and future prevention We discussed his erectile dysfunction gave him samples of Cialis Reviewed his current medications and updated them with him at present 3 months for measurement of her liver and basic metabolic panel

## 2011-02-05 ENCOUNTER — Ambulatory Visit: Payer: BC Managed Care – PPO | Admitting: Internal Medicine

## 2011-02-08 ENCOUNTER — Ambulatory Visit (INDEPENDENT_AMBULATORY_CARE_PROVIDER_SITE_OTHER): Payer: BC Managed Care – PPO | Admitting: Internal Medicine

## 2011-02-08 ENCOUNTER — Encounter: Payer: Self-pay | Admitting: Internal Medicine

## 2011-02-08 VITALS — BP 132/80 | Temp 97.7°F | Resp 16 | Ht 70.0 in | Wt 169.0 lb

## 2011-02-08 DIAGNOSIS — E785 Hyperlipidemia, unspecified: Secondary | ICD-10-CM

## 2011-02-08 DIAGNOSIS — I10 Essential (primary) hypertension: Secondary | ICD-10-CM

## 2011-02-08 DIAGNOSIS — E039 Hypothyroidism, unspecified: Secondary | ICD-10-CM

## 2011-02-08 DIAGNOSIS — E538 Deficiency of other specified B group vitamins: Secondary | ICD-10-CM

## 2011-02-08 DIAGNOSIS — H906 Mixed conductive and sensorineural hearing loss, bilateral: Secondary | ICD-10-CM

## 2011-02-08 DIAGNOSIS — E559 Vitamin D deficiency, unspecified: Secondary | ICD-10-CM

## 2011-02-08 DIAGNOSIS — R972 Elevated prostate specific antigen [PSA]: Secondary | ICD-10-CM

## 2011-02-08 DIAGNOSIS — B009 Herpesviral infection, unspecified: Secondary | ICD-10-CM

## 2011-02-08 DIAGNOSIS — B351 Tinea unguium: Secondary | ICD-10-CM

## 2011-02-08 DIAGNOSIS — R413 Other amnesia: Secondary | ICD-10-CM

## 2011-02-08 DIAGNOSIS — E291 Testicular hypofunction: Secondary | ICD-10-CM

## 2011-02-08 LAB — LIPID PANEL
Cholesterol: 173 mg/dL (ref 0–200)
HDL: 59.3 mg/dL (ref >39.00)
LDL Cholesterol: 102 mg/dL — ABNORMAL HIGH (ref 0–99)
Total CHOL/HDL Ratio: 3
Triglycerides: 61 mg/dL (ref 0.0–149.0)

## 2011-02-08 LAB — HEPATIC FUNCTION PANEL
AST: 31 U/L (ref 0–37)
Albumin: 4.4 g/dL (ref 3.5–5.2)
Total Protein: 7.1 g/dL (ref 6.0–8.3)

## 2011-02-08 LAB — TESTOSTERONE: Testosterone: 571.09 ng/dL (ref 350.00–890.00)

## 2011-02-08 LAB — PSA, TOTAL AND FREE: PSA: 3.11 ng/mL (ref <=4.00)

## 2011-02-08 MED ORDER — ACYCLOVIR 5 % EX OINT: TOPICAL_OINTMENT | CUTANEOUS | Status: AC

## 2011-02-08 NOTE — Progress Notes (Signed)
  Subjective:    Patient ID: Ryan [name], male    DOB: 09/02/1945, 65 y.o.   MRN: [id number]  HPI Patient is a 65 year old white male who is followed for multiple medical problems including hypothyroidism hyperlipidemia and erectile dysfunction due to hypogonadism.  He also has mild hypertension.  He presents today without acute complaints of chronic medical problems hypertension has been well-controlled he brings with him a record of home blood pressure readings which are within normal range he wishes to talk about multiple supplements that he takes in the potential interaction with his medications these were reviewed using interaction program.     Review of Systems  Constitutional: Negative for fever and fatigue.  HENT: Negative for hearing loss, congestion, neck pain and postnasal drip.   Eyes: Negative for discharge, redness and visual disturbance.  Respiratory: Negative for cough, shortness of breath and wheezing.   Cardiovascular: Negative for leg swelling.  Gastrointestinal: Negative for abdominal pain, constipation and abdominal distention.  Genitourinary: Negative for urgency and frequency.  Musculoskeletal: Negative for joint swelling and arthralgias.  Skin: Negative for color change and rash.  Neurological: Negative for weakness and light-headedness.  Hematological: Negative for adenopathy.  Psychiatric/Behavioral: Negative for behavioral problems.   Past Medical History  Diagnosis Date  . Hyperlipidemia   . Hypertension   . Back pain   . Fungal nail infection    Past Surgical History  Procedure Date  . Fungal nail   . Ankle surgery trauma   . Eye lid surgery     reports that he quit smoking about 29 years ago. He has never used smokeless tobacco. He reports that he drinks alcohol. He reports that he does not use illicit drugs. family history includes Alcohol abuse in his father; Heart disease in his mother; and Liver disease in his father.  There is no history  of Cancer. No Known Allergies      Objective:   Physical Exam  Nursing note and vitals reviewed. Constitutional: He appears well-developed and well-nourished.  HENT:  Head: Normocephalic and atraumatic.  Eyes: Conjunctivae are normal. Pupils are equal, round, and reactive to light.  Neck: Normal range of motion. Neck supple.  Cardiovascular: Normal rate and regular rhythm.   Pulmonary/Chest: Effort normal and breath sounds normal.  Abdominal: Soft. Bowel sounds are normal.          Assessment & Plan:  We discussed substituting his injectable Depo-Provera testosterone with an axillary applied preparation. This may get smoother distribution of the testosterone as well as prevent the potential trauma to the scan from frequent injections.  As well as decrease infection risk patient is willing to do a trial of 30 days of a topical axillary preparation and samples for 30 days were given to the patient was to call with followup  Otherwise the patient's hypertension hypothyroidism and other items on his problem list are stable he is up-to-date on issues of health maintenance.

## 2011-02-08 NOTE — Assessment & Plan Note (Addendum)
Recommend vinegar soaks up to 3 times a week to keep fungus from recurring

## 2011-02-09 LAB — VITAMIN D 25 HYDROXY (VIT D DEFICIENCY, FRACTURES): Vit D, 25-Hydroxy: 70 ng/mL (ref 30–89)

## 2011-02-19 ENCOUNTER — Ambulatory Visit (INDEPENDENT_AMBULATORY_CARE_PROVIDER_SITE_OTHER): Payer: BC Managed Care – PPO | Admitting: Internal Medicine

## 2011-02-19 DIAGNOSIS — Z23 Encounter for immunization: Secondary | ICD-10-CM

## 2011-05-06 ENCOUNTER — Other Ambulatory Visit: Payer: Self-pay | Admitting: Internal Medicine

## 2011-06-10 ENCOUNTER — Telehealth: Payer: Self-pay | Admitting: Internal Medicine

## 2011-06-10 ENCOUNTER — Encounter: Payer: Self-pay | Admitting: Internal Medicine

## 2011-06-10 ENCOUNTER — Ambulatory Visit (INDEPENDENT_AMBULATORY_CARE_PROVIDER_SITE_OTHER): Payer: BC Managed Care – PPO | Admitting: Internal Medicine

## 2011-06-10 VITALS — BP 130/74 | HR 76 | Temp 98.2°F | Resp 14 | Ht 70.0 in | Wt 170.0 lb

## 2011-06-10 DIAGNOSIS — B009 Herpesviral infection, unspecified: Secondary | ICD-10-CM

## 2011-06-10 DIAGNOSIS — E785 Hyperlipidemia, unspecified: Secondary | ICD-10-CM

## 2011-06-10 DIAGNOSIS — A609 Anogenital herpesviral infection, unspecified: Secondary | ICD-10-CM

## 2011-06-10 DIAGNOSIS — E039 Hypothyroidism, unspecified: Secondary | ICD-10-CM

## 2011-06-10 DIAGNOSIS — R972 Elevated prostate specific antigen [PSA]: Secondary | ICD-10-CM

## 2011-06-10 DIAGNOSIS — I10 Essential (primary) hypertension: Secondary | ICD-10-CM

## 2011-06-10 DIAGNOSIS — F528 Other sexual dysfunction not due to a substance or known physiological condition: Secondary | ICD-10-CM

## 2011-06-10 DIAGNOSIS — E291 Testicular hypofunction: Secondary | ICD-10-CM

## 2011-06-10 LAB — TESTOSTERONE: Testosterone: 735.76 ng/dL (ref 350.00–890.00)

## 2011-06-10 MED ORDER — TESTOSTERONE 30 MG/ACT TD SOLN: 1.0000 "application " | Freq: Every day | TRANSDERMAL | Status: DC

## 2011-06-10 MED ORDER — ACYCLOVIR 400 MG PO TABS: 400.0000 mg | ORAL_TABLET | Freq: Every day | ORAL | Status: DC

## 2011-06-10 NOTE — Telephone Encounter (Signed)
Pt was seen today and did not get his 30ml rx for Axiron to be filled locally at [facility]. He did get the one for mail order. Thanks.

## 2011-06-10 NOTE — Patient Instructions (Signed)
Back Exercises Back exercises help treat and prevent back injuries. The goal of back exercises is to increase the strength of your abdominal and back muscles and the flexibility of your back. These exercises should be started when you no longer have back pain. Back exercises include:  Pelvic Tilt. Lie on your back with your knees bent. Tilt your pelvis until the lower part of your back is against the floor. Hold this position 5 to 10 sec and repeat 5 to 10 times.   Knee to Chest. Pull first 1 knee up against your chest and hold for 20 to 30 seconds, repeat this with the other knee, and then both knees. This may be done with the other leg straight or bent, whichever feels better.   Sit-Ups or Curl-Ups. Bend your knees 90 degrees. Start with tilting your pelvis, and do a partial, slow sit-up, lifting your trunk only 30 to 45 degrees off the floor. Take at least 2 to 3 seconds for each sit-up. Do not do sit-ups with your knees out straight. If partial sit-ups are difficult, simply do the above but with only tightening your abdominal muscles and holding it as directed.   Hip-Lift. Lie on your back with your knees flexed 90 degrees. Push down with your feet and shoulders as you raise your hips a couple inches off the floor; hold for 10 seconds, repeat 5 to 10 times.   Back arches. Lie on your stomach, propping yourself up on bent elbows. Slowly press on your hands, causing an arch in your low back. Repeat 3 to 5 times. Any initial stiffness and discomfort should lessen with repetition over time.   Shoulder-Lifts. Lie face down with arms beside your body. Keep hips and torso pressed to floor as you slowly lift your head and shoulders off the floor.  Do not overdo your exercises, especially in the beginning. Exercises may cause you some mild back discomfort which lasts for a few minutes; however, if the pain is more severe, or lasts for more than 15 minutes, do not continue exercises until you see your  caregiver. Improvement with exercise therapy for back problems is slow.  See your caregivers for assistance with developing a proper back exercise program. Document Released: 07/29/2004 Document Revised: 02/17/2011 Document Reviewed: 06/21/2005 [facility] Patient Information 2012 [place], [place].

## 2011-06-10 NOTE — Progress Notes (Signed)
Subjective:    Patient ID: Ryan [name], male    DOB: [birth year], 65 y.o.   MRN: [id number]  HPI  Patient is a 65 year old white male presents for followup of hypertension and hyperlipidemia as well as monitoring for testosterone replacement.  He began testosterone replacement and is seeing a nurse practitioner on hormone replacement clinic he has been on testosterone since and we have monitored his levels of testosterone is due to testosterone level today he has a history of rectal dysfunction related to hypogonadism and has done well on testosterone replacement.  Review of Systems  Constitutional: Negative for fever and fatigue.  HENT: Negative for hearing loss, congestion, neck pain and postnasal drip.   Eyes: Negative for discharge, redness and visual disturbance.  Respiratory: Negative for cough, shortness of breath and wheezing.   Cardiovascular: Negative for leg swelling.  Gastrointestinal: Negative for abdominal pain, constipation and abdominal distention.  Genitourinary: Negative for urgency and frequency.  Musculoskeletal: Negative for joint swelling and arthralgias.  Skin: Negative for color change and rash.  Neurological: Negative for weakness and light-headedness.  Hematological: Negative for adenopathy.  Psychiatric/Behavioral: Negative for behavioral problems.   Past Medical History  Diagnosis Date  . Hyperlipidemia   . Hypertension   . Back pain   . Fungal nail infection     History   Social History  . Marital Status: Married    Spouse Name: N/A    Number of Children: N/A  . Years of Education: N/A   Occupational History  . Not on file.   Social History Main Topics  . Smoking status: Former Smoker    Quit date: 07/05/1981  . Smokeless tobacco: Never Used  . Alcohol Use: Yes  . Drug Use: No  . Sexually Active: Not on file   Other Topics Concern  . Not on file   Social History Narrative  . No narrative on file    Past Surgical History    Procedure Date  . Fungal nail   . Ankle surgery trauma   . Eye lid surgery     Family History  Problem Relation Age of Onset  . Heart disease Mother   . Liver disease Father   . Alcohol abuse Father   . Cancer Neg Hx     No Known Allergies  Current Outpatient Prescriptions on File Prior to Visit  Medication Sig Dispense Refill  . acyclovir (ZOVIRAX) 5 % ointment Apply thin layer to affected area  30 g  2  . anastrozole (ARIMIDEX) 1 MG tablet Take by mouth. 1/2 tab weekly        . ARMOUR THYROID 60 MG tablet TAKE 1 TABLET EVERY MORNING ON AN EMPTY STOMACH  30 tablet  11  . Ascorbic Acid (VITAMIN C) 1000 MG tablet Take 1,000 mg by mouth daily.        . Cholecalciferol (VITAMIN D) 1000 UNITS capsule Take 1,000 Units by mouth daily.        Marland Kitchen co-enzyme Q-10 30 MG capsule Take 30 mg by mouth daily.        . Cyanocobalamin (VITAMIN B-12 CR) 1500 MCG TBCR Take by mouth.        Marland Kitchen LOVAZA 1 G capsule TAKE 2 CAPSULES TWICE A DAY  360 capsule  2  . magnesium hydroxide (MILK OF MAGNESIA) 400 MG/5ML suspension Take by mouth daily.        . Milk Thistle 150 MG CAPS Take by mouth. 375mg  per day       .  Olive Leaf Extract 500 MG CAPS Take by mouth daily.        . Red Yeast Rice 600 MG TABS Take by mouth every morning.        . testosterone cypionate (DEPOTESTOTERONE CYPIONATE) 200 MG/ML injection Inject 200 mg into the muscle every 14 (fourteen) days.        . vitamin E 400 UNIT capsule Take 400 Units by mouth daily.        Foye Deer EXTRACT PO Take 980 tablets by mouth.        . Zinc 50 MG CAPS Take by mouth.        Marland Kitchen acyclovir (ZOVIRAX) 400 MG tablet Take 1 tablet (400 mg total) by mouth daily.  90 tablet  3    BP 130/74  Pulse 76  Temp 98.2 F (36.8 C)  Resp 14  Ht 5\' 10"  (1.778 m)  Wt 170 lb (77.111 kg)  BMI 24.39 kg/m2       Objective:   Physical Exam  Constitutional: He appears well-developed and well-nourished.  HENT:  Head: Normocephalic and atraumatic.  Eyes:  Conjunctivae are normal. Pupils are equal, round, and reactive to light.  Neck: Normal range of motion. Neck supple.  Cardiovascular: Normal rate and regular rhythm.   Pulmonary/Chest: Effort normal and breath sounds normal.  Abdominal: Soft. Bowel sounds are normal.          Assessment & Plan:  Monitor a total testosterone and free testosterone today to monitor his testosterone replacement.  He states he feels well on his current regimen his blood pressure stable on his current medications.  Lipid and liver should be monitored for continued monitoring of his history of hyperlipidemia.  Generally he is feeling well We will monitor his PSA Also refilled his valacyclovir as prophylaxis for HSV

## 2011-06-11 ENCOUNTER — Other Ambulatory Visit: Payer: Self-pay | Admitting: *Deleted

## 2011-06-11 DIAGNOSIS — E291 Testicular hypofunction: Secondary | ICD-10-CM

## 2011-06-11 DIAGNOSIS — F528 Other sexual dysfunction not due to a substance or known physiological condition: Secondary | ICD-10-CM

## 2011-06-11 MED ORDER — TESTOSTERONE 30 MG/ACT TD SOLN: 1.0000 "application " | Freq: Every day | TRANSDERMAL | Status: DC

## 2011-06-11 NOTE — Telephone Encounter (Signed)
Pt called req status of getting Testosterone (AXIRON) 30 MG/ACT SOLN script for [facility] [place]. Pt can pick up script today, when it ready. Pls call.

## 2011-06-30 ENCOUNTER — Telehealth: Payer: Self-pay | Admitting: Internal Medicine

## 2011-06-30 NOTE — Telephone Encounter (Signed)
I already processed this prior auth on on the Axiron. Just received a fax from [facility] today.I am waiting on [place] to complete the paperwork

## 2011-06-30 NOTE — Telephone Encounter (Signed)
Pt needs [place] to contact [facility], they need clarification on   Testosterone (AXIRON) 30 MG/ACT SOLN    Pt requesting they be contacted before  2:00 today    [facility] [phone] ref #[id number]

## 2011-06-30 NOTE — Telephone Encounter (Signed)
Pt aware.

## 2011-07-01 ENCOUNTER — Other Ambulatory Visit: Payer: Self-pay | Admitting: *Deleted

## 2011-07-01 MED ORDER — TESTOSTERONE CYPIONATE 200 MG/ML IM SOLN: 200.0000 mg | INTRAMUSCULAR | Status: DC

## 2011-07-01 NOTE — Telephone Encounter (Signed)
Lowest testosterone level was 497--insurance needs below 350 for approval- pt request to return to injection

## 2011-07-16 ENCOUNTER — Other Ambulatory Visit: Payer: Self-pay | Admitting: Internal Medicine

## 2011-10-21 ENCOUNTER — Other Ambulatory Visit (INDEPENDENT_AMBULATORY_CARE_PROVIDER_SITE_OTHER): Payer: BC Managed Care – PPO

## 2011-10-21 DIAGNOSIS — Z0000 Encounter for general adult medical examination without abnormal findings: Secondary | ICD-10-CM

## 2011-10-21 LAB — CBC WITH DIFFERENTIAL/PLATELET
Basophils Relative: 0.8 % (ref 0.0–3.0)
Eosinophils Relative: 1.9 % (ref 0.0–5.0)
HCT: 49.1 % (ref 39.0–52.0)
Hemoglobin: 16.2 g/dL (ref 13.0–17.0)
Lymphs Abs: 1.7 10*3/uL (ref 0.7–4.0)
MCV: 88.8 fl (ref 78.0–100.0)
Monocytes Absolute: 0.3 10*3/uL (ref 0.1–1.0)
Monocytes Relative: 5.2 % (ref 3.0–12.0)
Neutro Abs: 3.3 10*3/uL (ref 1.4–7.7)
Platelets: 202 10*3/uL (ref 150.0–400.0)
WBC: 5.3 10*3/uL (ref 4.5–10.5)

## 2011-10-21 LAB — POCT URINALYSIS DIPSTICK
Glucose, UA: NEGATIVE
Ketones, UA: NEGATIVE
Leukocytes, UA: NEGATIVE
Protein, UA: NEGATIVE

## 2011-10-21 LAB — BASIC METABOLIC PANEL
BUN: 9 mg/dL (ref 6–23)
Chloride: 103 mEq/L (ref 96–112)
GFR: 97.73 mL/min (ref >60.00)
Potassium: 4.3 mEq/L (ref 3.5–5.1)
Sodium: 138 mEq/L (ref 135–145)

## 2011-10-21 LAB — HEPATIC FUNCTION PANEL
ALT: 26 U/L (ref 0–53)
AST: 24 U/L (ref 0–37)
Albumin: 4.4 g/dL (ref 3.5–5.2)
Total Bilirubin: 0.6 mg/dL (ref 0.3–1.2)
Total Protein: 7.3 g/dL (ref 6.0–8.3)

## 2011-10-21 LAB — LIPID PANEL
Cholesterol: 195 mg/dL (ref 0–200)
VLDL: 15.4 mg/dL (ref 0.0–40.0)

## 2011-10-21 LAB — PSA: PSA: 2.88 ng/mL (ref 0.10–4.00)

## 2011-10-21 LAB — TSH: TSH: 0.76 u[IU]/mL (ref 0.35–5.50)

## 2011-10-28 ENCOUNTER — Ambulatory Visit (INDEPENDENT_AMBULATORY_CARE_PROVIDER_SITE_OTHER): Payer: BC Managed Care – PPO | Admitting: Internal Medicine

## 2011-10-28 ENCOUNTER — Encounter: Payer: Self-pay | Admitting: Internal Medicine

## 2011-10-28 VITALS — BP 132/80 | HR 68 | Temp 98.0°F | Resp 16 | Ht 70.0 in | Wt 174.0 lb

## 2011-10-28 DIAGNOSIS — E785 Hyperlipidemia, unspecified: Secondary | ICD-10-CM

## 2011-10-28 DIAGNOSIS — Z0000 Encounter for general adult medical examination without abnormal findings: Secondary | ICD-10-CM

## 2011-10-28 NOTE — Patient Instructions (Signed)
The patient is instructed to continue all medications as prescribed. Schedule followup with check out clerk upon leaving the clinic  

## 2011-10-28 NOTE — Progress Notes (Signed)
Subjective:    Patient ID: Ryan [name], male    DOB: 09-10-1945, 66 y.o.   MRN: [id number]  HPI The pt has a face lift with Dr Dennie Bible and was off all meds for 6 weeks Surgery march 4th  CPX     Review of Systems  Constitutional: Negative for fever and fatigue.  HENT: Negative for hearing loss, congestion, neck pain and postnasal drip.   Eyes: Negative for discharge, redness and visual disturbance.  Respiratory: Negative for cough, shortness of breath and wheezing.   Cardiovascular: Negative for leg swelling.  Gastrointestinal: Negative for abdominal pain, constipation and abdominal distention.  Genitourinary: Negative for urgency and frequency.  Musculoskeletal: Negative for joint swelling and arthralgias.  Skin: Negative for color change and rash.  Neurological: Negative for weakness and light-headedness.  Hematological: Negative for adenopathy.  Psychiatric/Behavioral: Negative for behavioral problems.   Past Medical History  Diagnosis Date  . Hyperlipidemia   . Hypertension   . Back pain   . Fungal nail infection     History   Social History  . Marital Status: Married    Spouse Name: N/A    Number of Children: N/A  . Years of Education: N/A   Occupational History  . Not on file.   Social History Main Topics  . Smoking status: Former Smoker    Quit date: 07/05/1981  . Smokeless tobacco: Never Used  . Alcohol Use: Yes  . Drug Use: No  . Sexually Active: Not on file   Other Topics Concern  . Not on file   Social History Narrative  . No narrative on file    Past Surgical History  Procedure Date  . Fungal nail   . Ankle surgery trauma   . Eye lid surgery     Family History  Problem Relation Age of Onset  . Heart disease Mother   . Liver disease Father   . Alcohol abuse Father   . Cancer Neg Hx     No Known Allergies  Current Outpatient Prescriptions on File Prior to Visit  Medication Sig Dispense Refill  . acyclovir (ZOVIRAX) 5 % ointment  Apply thin layer to affected area  30 g  2  . anastrozole (ARIMIDEX) 1 MG tablet Take by mouth. 1/2 tab weekly        . ARMOUR THYROID 60 MG tablet TAKE 1 TABLET EVERY MORNING ON AN EMPTY STOMACH  30 tablet  11  . Ascorbic Acid (VITAMIN C) 1000 MG tablet Take 1,000 mg by mouth daily.        . Cholecalciferol (VITAMIN D) 1000 UNITS capsule Take 1,000 Units by mouth daily.        Marland Kitchen co-enzyme Q-10 30 MG capsule Take 30 mg by mouth daily.        . Cyanocobalamin (VITAMIN B-12 CR) 1500 MCG TBCR Take by mouth.        Marland Kitchen LOVAZA 1 G capsule TAKE 2 CAPSULES TWICE A DAY  360 capsule  1  . magnesium hydroxide (MILK OF MAGNESIA) 400 MG/5ML suspension Take by mouth daily.        . Milk Thistle 150 MG CAPS Take by mouth. 375mg  per day       . Olive Leaf Extract 500 MG CAPS Take by mouth daily.        . Red Yeast Rice 600 MG TABS Take by mouth every morning.        . testosterone cypionate (DEPOTESTOTERONE CYPIONATE) 200 MG/ML injection Inject 1  mL (200 mg total) into the muscle every 14 (fourteen) days.  10 mL  1  . vitamin E 400 UNIT capsule Take 400 Units by mouth daily.        Foye Deer EXTRACT PO Take 980 tablets by mouth.        . Zinc 50 MG CAPS Take by mouth.        . DISCONTD: acyclovir (ZOVIRAX) 400 MG tablet Take 1 tablet (400 mg total) by mouth daily.  90 tablet  3    BP 132/80  Pulse 68  Temp 98 F (36.7 C)  Resp 16  Ht 5\' 10"  (1.778 m)  Wt 174 lb (78.926 kg)  BMI 24.97 kg/m2        Objective:   Physical Exam  Nursing note and vitals reviewed. Constitutional: He appears well-developed and well-nourished.  HENT:  Head: Normocephalic and atraumatic.  Eyes: Conjunctivae are normal. Pupils are equal, round, and reactive to light.  Neck: Normal range of motion. Neck supple.  Cardiovascular: Normal rate and regular rhythm.   Pulmonary/Chest: Effort normal and breath sounds normal.  Abdominal: Soft. Bowel sounds are normal.          Assessment & Plan:    Patient presents for  yearly preventative medicine examination.   all immunizations and health maintenance protocols were reviewed with the patient and they are up to date with these protocols.   screening laboratory values were reviewed with the patient including screening of hyperlipidemia PSA renal function and hepatic function.   There medications past medical history social history problem list and allergies were reviewed in detail.   Goals were established with regard to weight loss exercise diet in compliance with medications

## 2011-11-04 ENCOUNTER — Telehealth: Payer: Self-pay | Admitting: Family Medicine

## 2011-11-04 MED ORDER — TESTOSTERONE 12.5 MG/ACT (1%) TD GEL: 4.0000 | Freq: Every day | TRANSDERMAL | Status: DC

## 2011-11-04 NOTE — Telephone Encounter (Signed)
Ryan [name], Ryan [name]'s Axiron prior Berkley Harvey was denied, because it is not the preferred drug. However, [facility] DID go ahead and approve a 1-yr supply of Androgel. If we can change his Rx from Axiron to Androgel and submit to the pharmacy, it will be covered. Please notify pt of the switch. Thanks.

## 2011-11-04 NOTE — Telephone Encounter (Signed)
Pt informed and med sent in=thanks

## 2011-11-06 ENCOUNTER — Other Ambulatory Visit: Payer: Self-pay | Admitting: Internal Medicine

## 2011-12-20 ENCOUNTER — Other Ambulatory Visit: Payer: Self-pay | Admitting: Internal Medicine

## 2012-03-19 ENCOUNTER — Other Ambulatory Visit: Payer: Self-pay | Admitting: Internal Medicine

## 2012-04-28 ENCOUNTER — Ambulatory Visit (INDEPENDENT_AMBULATORY_CARE_PROVIDER_SITE_OTHER): Payer: BC Managed Care – PPO | Admitting: Internal Medicine

## 2012-04-28 ENCOUNTER — Encounter: Payer: Self-pay | Admitting: Internal Medicine

## 2012-04-28 VITALS — BP 132/80 | HR 72 | Temp 97.8°F | Resp 16 | Ht 70.0 in | Wt 170.0 lb

## 2012-04-28 DIAGNOSIS — F528 Other sexual dysfunction not due to a substance or known physiological condition: Secondary | ICD-10-CM

## 2012-04-28 DIAGNOSIS — E785 Hyperlipidemia, unspecified: Secondary | ICD-10-CM

## 2012-04-28 DIAGNOSIS — I10 Essential (primary) hypertension: Secondary | ICD-10-CM

## 2012-04-28 DIAGNOSIS — E291 Testicular hypofunction: Secondary | ICD-10-CM

## 2012-04-28 DIAGNOSIS — M549 Dorsalgia, unspecified: Secondary | ICD-10-CM

## 2012-04-28 LAB — LIPID PANEL
Cholesterol: 247 mg/dL — ABNORMAL HIGH (ref 0–200)
HDL: 56.7 mg/dL (ref >39.00)
Triglycerides: 81 mg/dL (ref 0.0–149.0)
VLDL: 16.2 mg/dL (ref 0.0–40.0)

## 2012-04-28 LAB — HEPATIC FUNCTION PANEL
Albumin: 4 g/dL (ref 3.5–5.2)
Total Protein: 7.7 g/dL (ref 6.0–8.3)

## 2012-04-28 NOTE — Progress Notes (Signed)
Subjective:    Patient ID: Ryan [name], male    DOB: September 26, 1945, 66 y.o.   MRN: [id number]  HPI  persistent back pain that is lessened by aleve Needs to complete the exercise monitoring the testosterone and the use of the arimidex Monitoring the lipids    Review of Systems  Constitutional: Negative for fever and fatigue.  HENT: Negative for hearing loss, congestion, neck pain and postnasal drip.   Eyes: Negative for discharge, redness and visual disturbance.  Respiratory: Negative for cough, shortness of breath and wheezing.   Cardiovascular: Negative for leg swelling.  Gastrointestinal: Negative for abdominal pain, constipation and abdominal distention.  Genitourinary: Negative for urgency and frequency.  Musculoskeletal: Negative for joint swelling and arthralgias.  Skin: Negative for color change and rash.  Neurological: Negative for weakness and light-headedness.  Hematological: Negative for adenopathy.  Psychiatric/Behavioral: Negative for behavioral problems.   Past Medical History  Diagnosis Date  . Hyperlipidemia   . Hypertension   . Back pain   . Fungal nail infection     History   Social History  . Marital Status: Married    Spouse Name: N/A    Number of Children: N/A  . Years of Education: N/A   Occupational History  . Not on file.   Social History Main Topics  . Smoking status: Former Smoker    Quit date: 07/05/1981  . Smokeless tobacco: Never Used  . Alcohol Use: Yes  . Drug Use: No  . Sexually Active: Not on file   Other Topics Concern  . Not on file   Social History Narrative  . No narrative on file    Past Surgical History  Procedure Date  . Fungal nail   . Ankle surgery trauma   . Eye lid surgery     Family History  Problem Relation Age of Onset  . Heart disease Mother   . Liver disease Father   . Alcohol abuse Father   . Cancer Neg Hx     No Known Allergies  Current Outpatient Prescriptions on File Prior to Visit    Medication Sig Dispense Refill  . acyclovir (ZOVIRAX) 400 MG tablet Take 400 mg by mouth daily.      Marland Kitchen anastrozole (ARIMIDEX) 1 MG tablet TAKE 1/2 TABLET BY MOUTH 3 TIMES A WEEK  12 tablet  1  . ARMOUR THYROID 60 MG tablet TAKE 1 TABLET EVERY MORNING ON AN EMPTY STOMACH  30 tablet  11  . Ascorbic Acid (VITAMIN C) 1000 MG tablet Take 1,000 mg by mouth daily.        . Cholecalciferol (VITAMIN D) 1000 UNITS capsule Take 1,000 Units by mouth daily.        Marland Kitchen co-enzyme Q-10 30 MG capsule Take 100 mg by mouth 2 (two) times daily.       . Cyanocobalamin (VITAMIN B-12 CR) 1500 MCG TBCR Take by mouth.        Marland Kitchen LOVAZA 1 G capsule TAKE 2 CAPSULES TWICE A DAY  360 each  3  . magnesium hydroxide (MILK OF MAGNESIA) 400 MG/5ML suspension Take by mouth daily.        . Milk Thistle 150 MG CAPS Take by mouth. 375mg  per day       . Olive Leaf Extract 500 MG CAPS Take by mouth daily.        . Red Yeast Rice 600 MG TABS Take 4 tablets by mouth at bedtime.       . Testosterone (  ANDROGEL PUMP) 1.25 GM/ACT (1%) GEL Apply 4 Squirts topically daily.  75 g  5  . vitamin E 400 UNIT capsule Take 400 Units by mouth daily.        . Zinc 50 MG CAPS Take by mouth.          BP 132/80  Pulse 72  Temp 97.8 F (36.6 C)  Resp 16  Ht 5\' 10"  (1.778 m)  Wt 170 lb (77.111 kg)  BMI 24.39 kg/m2       Objective:   Physical Exam  Nursing note and vitals reviewed. Constitutional: He appears well-developed and well-nourished.  HENT:  Head: Normocephalic and atraumatic.  Eyes: Conjunctivae normal are normal. Pupils are equal, round, and reactive to light.  Neck: Normal range of motion. Neck supple.  Cardiovascular: Normal rate and regular rhythm.   Pulmonary/Chest: Effort normal and breath sounds normal.  Abdominal: Soft. Bowel sounds are normal.          Assessment & Plan:  Patient will have a lipid panel today as well as a TSH For monitoring of thyroid and lipid response he is currently on right yeast and omega  3 He will also have a testosterone monitored today for effectiveness of his androgen Arimidex combination We mentioned back exercises and he realizes the need to faithfully asked acute back stretching and exercises

## 2012-05-08 ENCOUNTER — Encounter: Payer: Self-pay | Admitting: Internal Medicine

## 2012-05-08 ENCOUNTER — Other Ambulatory Visit: Payer: Self-pay | Admitting: Internal Medicine

## 2012-07-05 DIAGNOSIS — C801 Malignant (primary) neoplasm, unspecified: Secondary | ICD-10-CM

## 2012-07-05 DIAGNOSIS — Z8669 Personal history of other diseases of the nervous system and sense organs: Secondary | ICD-10-CM

## 2012-07-05 DIAGNOSIS — G4730 Sleep apnea, unspecified: Secondary | ICD-10-CM

## 2012-07-05 DIAGNOSIS — Z8639 Personal history of other endocrine, nutritional and metabolic disease: Secondary | ICD-10-CM

## 2012-07-05 HISTORY — DX: Personal history of other endocrine, nutritional and metabolic disease: Z86.39

## 2012-07-05 HISTORY — DX: Malignant (primary) neoplasm, unspecified: C80.1

## 2012-07-05 HISTORY — DX: Personal history of other diseases of the nervous system and sense organs: Z86.69

## 2012-07-05 HISTORY — DX: Sleep apnea, unspecified: G47.30

## 2012-08-19 ENCOUNTER — Other Ambulatory Visit: Payer: Self-pay

## 2012-08-25 ENCOUNTER — Other Ambulatory Visit: Payer: Self-pay | Admitting: Internal Medicine

## 2012-08-30 ENCOUNTER — Other Ambulatory Visit (INDEPENDENT_AMBULATORY_CARE_PROVIDER_SITE_OTHER): Payer: BC Managed Care – PPO

## 2012-08-30 DIAGNOSIS — E785 Hyperlipidemia, unspecified: Secondary | ICD-10-CM

## 2012-08-30 LAB — HEPATIC FUNCTION PANEL
AST: 23 U/L (ref 0–37)
Albumin: 4.3 g/dL (ref 3.5–5.2)
Total Bilirubin: 1.1 mg/dL (ref 0.3–1.2)

## 2012-08-30 LAB — LIPID PANEL
HDL: 61.2 mg/dL (ref >39.00)
LDL Cholesterol: 118 mg/dL — ABNORMAL HIGH (ref 0–99)
Total CHOL/HDL Ratio: 3
Triglycerides: 84 mg/dL (ref 0.0–149.0)
VLDL: 16.8 mg/dL (ref 0.0–40.0)

## 2012-09-06 ENCOUNTER — Ambulatory Visit (INDEPENDENT_AMBULATORY_CARE_PROVIDER_SITE_OTHER): Payer: Medicare Other | Admitting: Internal Medicine

## 2012-09-06 ENCOUNTER — Encounter: Payer: Self-pay | Admitting: Internal Medicine

## 2012-09-06 VITALS — BP 116/78 | HR 72 | Temp 98.2°F | Resp 16 | Ht 70.0 in | Wt 168.0 lb

## 2012-09-06 DIAGNOSIS — E785 Hyperlipidemia, unspecified: Secondary | ICD-10-CM | POA: Diagnosis not present

## 2012-09-06 DIAGNOSIS — E291 Testicular hypofunction: Secondary | ICD-10-CM | POA: Diagnosis not present

## 2012-09-06 DIAGNOSIS — E039 Hypothyroidism, unspecified: Secondary | ICD-10-CM

## 2012-09-06 DIAGNOSIS — I10 Essential (primary) hypertension: Secondary | ICD-10-CM

## 2012-09-06 MED ORDER — ANASTROZOLE 1 MG PO TABS: ORAL_TABLET | ORAL | Status: DC

## 2012-09-06 NOTE — Progress Notes (Signed)
  Subjective:    Patient ID: Ryan [name], male    DOB: Nov 16, 1945, 67 y.o.   MRN: [id number]  HPI  Patient is 67 year old male is followed for hyperlipidemia recently his blood work showed a worsening of his lipid panel 4 months ago.  After intervention his blood pressure is stable his lipid panel has markedly improved with total cholesterol dipping below 200 and his HDL above 60 the ratio of HDL total cholesterol indicates low risk as well as the ratio of LDL cholesterol to HDL cholesterol.    Review of Systems  Constitutional: Negative for fever and fatigue.  HENT: Negative for hearing loss, congestion, neck pain and postnasal drip.   Eyes: Negative for discharge, redness and visual disturbance.  Respiratory: Negative for cough, shortness of breath and wheezing.   Cardiovascular: Negative for leg swelling.  Gastrointestinal: Negative for abdominal pain, constipation and abdominal distention.  Genitourinary: Negative for urgency and frequency.  Musculoskeletal: Negative for joint swelling and arthralgias.  Skin: Negative for color change and rash.  Neurological: Negative for weakness and light-headedness.  Hematological: Negative for adenopathy.  Psychiatric/Behavioral: Negative for behavioral problems.       Objective:   Physical Exam  Nursing note and vitals reviewed. Constitutional: He appears well-developed and well-nourished.  HENT:  Head: Normocephalic and atraumatic.  Eyes: Conjunctivae are normal. Pupils are equal, round, and reactive to light.  Neck: Normal range of motion. Neck supple.  Cardiovascular: Normal rate and regular rhythm.   Pulmonary/Chest: Effort normal and breath sounds normal.  Abdominal: Soft. Bowel sounds are normal.          Assessment & Plan:  Patient's fish oil regimen with the addition of a red rice  yeast  Is effective in controlling his cholesterol.   Reviewed the side effects of arimidex and testosterone Discussed the  possibility of deviated septum surgery Breath right nasal strips

## 2012-09-06 NOTE — Addendum Note (Signed)
Addended by: Stacie Glaze on: 09/06/2012 10:07 AM   Modules accepted: Level of Service

## 2012-09-06 NOTE — Patient Instructions (Addendum)
The patient is instructed to continue all medications as prescribed. Schedule followup with check out clerk upon leaving the clinic  

## 2012-09-06 NOTE — Addendum Note (Signed)
Addended by: Willy Eddy on: 09/06/2012 12:48 PM   Modules accepted: Orders

## 2013-01-01 ENCOUNTER — Encounter: Payer: Self-pay | Admitting: Internal Medicine

## 2013-01-01 ENCOUNTER — Ambulatory Visit (INDEPENDENT_AMBULATORY_CARE_PROVIDER_SITE_OTHER)
Admission: RE | Admit: 2013-01-01 | Discharge: 2013-01-01 | Disposition: A | Discharge status: Home / Self Care | Payer: Medicare Other | Source: Ambulatory Visit | Attending: Internal Medicine | Admitting: Internal Medicine

## 2013-01-01 ENCOUNTER — Ambulatory Visit (INDEPENDENT_AMBULATORY_CARE_PROVIDER_SITE_OTHER): Payer: Medicare Other | Admitting: Internal Medicine

## 2013-01-01 VITALS — BP 140/80 | HR 72 | Temp 98.3°F | Resp 16 | Ht 70.0 in | Wt 167.0 lb

## 2013-01-01 DIAGNOSIS — Z0000 Encounter for general adult medical examination without abnormal findings: Secondary | ICD-10-CM

## 2013-01-01 DIAGNOSIS — E291 Testicular hypofunction: Secondary | ICD-10-CM | POA: Diagnosis not present

## 2013-01-01 DIAGNOSIS — M545 Low back pain, unspecified: Secondary | ICD-10-CM

## 2013-01-01 DIAGNOSIS — M47817 Spondylosis without myelopathy or radiculopathy, lumbosacral region: Secondary | ICD-10-CM | POA: Diagnosis not present

## 2013-01-01 DIAGNOSIS — M8580 Other specified disorders of bone density and structure, unspecified site: Secondary | ICD-10-CM

## 2013-01-01 DIAGNOSIS — M949 Disorder of cartilage, unspecified: Secondary | ICD-10-CM

## 2013-01-01 DIAGNOSIS — E785 Hyperlipidemia, unspecified: Secondary | ICD-10-CM | POA: Diagnosis not present

## 2013-01-01 DIAGNOSIS — E039 Hypothyroidism, unspecified: Secondary | ICD-10-CM | POA: Diagnosis not present

## 2013-01-01 DIAGNOSIS — T887XXA Unspecified adverse effect of drug or medicament, initial encounter: Secondary | ICD-10-CM | POA: Diagnosis not present

## 2013-01-01 DIAGNOSIS — R972 Elevated prostate specific antigen [PSA]: Secondary | ICD-10-CM | POA: Diagnosis not present

## 2013-01-01 DIAGNOSIS — I10 Essential (primary) hypertension: Secondary | ICD-10-CM | POA: Diagnosis not present

## 2013-01-01 DIAGNOSIS — F528 Other sexual dysfunction not due to a substance or known physiological condition: Secondary | ICD-10-CM

## 2013-01-01 DIAGNOSIS — M899 Disorder of bone, unspecified: Secondary | ICD-10-CM | POA: Diagnosis not present

## 2013-01-01 LAB — CBC WITH DIFFERENTIAL/PLATELET
Basophils Absolute: 0 10*3/uL (ref 0.0–0.1)
Eosinophils Relative: 2 % (ref 0.0–5.0)
Hemoglobin: 16.8 g/dL (ref 13.0–17.0)
Lymphocytes Relative: 30.3 % (ref 12.0–46.0)
Monocytes Relative: 5.5 % (ref 3.0–12.0)
Neutro Abs: 3.9 10*3/uL (ref 1.4–7.7)
RDW: 14.1 % (ref 11.5–14.6)
WBC: 6.3 10*3/uL (ref 4.5–10.5)

## 2013-01-01 LAB — POCT URINALYSIS DIPSTICK
Glucose, UA: NEGATIVE
Ketones, UA: NEGATIVE
Leukocytes, UA: NEGATIVE
Protein, UA: NEGATIVE
Urobilinogen, UA: 0.2

## 2013-01-01 LAB — BASIC METABOLIC PANEL
BUN: 10 mg/dL (ref 6–23)
Chloride: 101 mEq/L (ref 96–112)
GFR: 92.27 mL/min (ref >60.00)
Potassium: 4.9 mEq/L (ref 3.5–5.1)
Sodium: 137 mEq/L (ref 135–145)

## 2013-01-01 LAB — LIPID PANEL
Cholesterol: 208 mg/dL — ABNORMAL HIGH (ref 0–200)
Total CHOL/HDL Ratio: 3
VLDL: 17.6 mg/dL (ref 0.0–40.0)

## 2013-01-01 LAB — HEPATIC FUNCTION PANEL
ALT: 25 U/L (ref 0–53)
AST: 24 U/L (ref 0–37)
Alkaline Phosphatase: 65 U/L (ref 39–117)
Bilirubin, Direct: 0.2 mg/dL (ref 0.0–0.3)
Total Bilirubin: 1.2 mg/dL (ref 0.3–1.2)

## 2013-01-01 LAB — TSH: TSH: 0.58 u[IU]/mL (ref 0.35–5.50)

## 2013-01-01 NOTE — Progress Notes (Signed)
Subjective:    Patient ID: Ryan [name], male    DOB: 05/25/1946, 67 y.o.   MRN: [id number]  HPI Patient is a 67 year old male who presents for an annual examination.  He has a history of low back pain seen by a chiropractor and has some persistent pain that warrants back films.  He has mild to moderate erectile dysfunction on testosterone replacement for which we'll monitor her testosterone has a history of mild to moderate elevation of lipids for which we'll monitor lipid and liver his hypertension that is treated for which we will monitor a basic metabolic panel.     Review of Systems  Constitutional: Negative for fever and fatigue.  HENT: Negative for hearing loss, congestion, neck pain and postnasal drip.   Eyes: Negative for discharge, redness and visual disturbance.  Respiratory: Negative for cough, shortness of breath and wheezing.   Cardiovascular: Negative for leg swelling.  Gastrointestinal: Negative for abdominal pain, constipation and abdominal distention.  Genitourinary: Negative for urgency and frequency.  Musculoskeletal: Negative for joint swelling and arthralgias.  Skin: Negative for color change and rash.  Neurological: Negative for weakness and light-headedness.  Hematological: Negative for adenopathy.  Psychiatric/Behavioral: Negative for behavioral problems.       Objective:   Physical Exam  Constitutional: He appears well-developed and well-nourished.  HENT:  Head: Normocephalic and atraumatic.  Eyes: Conjunctivae are normal. Pupils are equal, round, and reactive to light.  Neck: Normal range of motion. Neck supple.  Cardiovascular: Normal rate and regular rhythm.   Murmur heard. Pulmonary/Chest: Effort normal and breath sounds normal.  Abdominal: Soft. Bowel sounds are normal.          Assessment & Plan:  Appropriate monitoring blood work for chronic conditions as listed Stable hypertension Monitoring for hyperlipidemia.  Monitoring for  history of elevated prostate-specific antigen.  Monitoring vitamin D 4 risk of osteopenia Subjective:    Ryan [name] is a 67 y.o. male who presents for a welcome to Medicare exam.   Cardiac risk factors: dyslipidemia, hypertension and male gender.  Depression Screen (Note: if answer to either of the following is "Yes", a more complete depression screening is indicated)  Q1: Over the past two weeks, have you felt down, depressed or hopeless? no Q2: Over the past two weeks, have you felt little interest or pleasure in doing things? no  Activities of Daily Living In your present state of health, do you have any difficulty performing the following activities?:  Preparing food and eating?: No Bathing yourself: No Getting dressed: No Using the toilet:No Moving around from place to place: No In the past year have you fallen or had a near fall?:No  Current exercise habits: Gym/ health club routine includes cardio and light weights.  Dietary issues discussed: none   Hearing difficulties: No Safe in current home environment: yes  The following portions of the patient's history were reviewed and updated as appropriate: allergies, current medications, past family history, past medical history, past social history, past surgical history and problem list. Review of Systems A comprehensive review of systems was negative.    Objective:     Vision by Snellen chart: right eye:20/20, left eye:20/20  Laser surgery in 2002 Blood pressure 140/80, pulse 72, temperature 98.3 F (36.8 C), resp. rate 16, height 5\' 10"  (1.778 m), weight 167 lb (75.751 kg). Body mass index is 23.96 kg/(m^2). BP 140/80  Pulse 72  Temp(Src) 98.3 F (36.8 C)  Resp 16  Ht 5\' 10"  (1.778 m)  Wt 167 lb (75.751 kg)  BMI 23.96 kg/m2  General Appearance:    Alert, cooperative, no distress, appears stated age  Head:    Normocephalic, without obvious abnormality, atraumatic  Eyes:    PERRL, conjunctiva/corneas  clear, EOM's intact, fundi    benign, both eyes       Ears:    Normal TM's and external ear canals, both ears  Nose:   Nares normal, septum midline, mucosa normal, no drainage    or sinus tenderness  Throat:   Lips, mucosa, and tongue normal; teeth and gums normal  Neck:   Supple, symmetrical, trachea midline, no adenopathy;       thyroid:  No enlargement/tenderness/nodules; no carotid   bruit or JVD  Back:     Symmetric, no curvature, ROM normal, no CVA tenderness  Lungs:     Clear to auscultation bilaterally, respirations unlabored  Chest wall:    No tenderness or deformity  Heart:    Regular rate and rhythm, S1 and S2 normal, no murmur, rub   or gallop  Abdomen:     Soft, non-tender, bowel sounds active all four quadrants,    no masses, no organomegaly  Genitalia:    Normal male without lesion, discharge or tenderness  Rectal:    Normal tone, normal prostate, no masses or tenderness;   guaiac negative stool  Extremities:   Extremities normal, atraumatic, no cyanosis or edema  Pulses:   2+ and symmetric all extremities  Skin:   Skin color, texture, turgor normal, no rashes or lesions  Lymph nodes:   Cervical, supraclavicular, and axillary nodes normal  Neurologic:   CNII-XII intact. Normal strength, sensation and reflexes      throughout      Assessment:      Patient presents for yearly preventative medicine examination.   all immunizations and health maintenance protocols were reviewed with the patient and they are up to date with these protocols.   screening laboratory values were reviewed with the patient including screening of hyperlipidemia PSA renal function and hepatic function.   There medications past medical history social history problem list and allergies were reviewed in detail.   Goals were established with regard to weight loss exercise diet in compliance with medications      Plan:     During the course of the visit the patient was educated and counseled  about appropriate screening and preventive services including:   Influenza vaccine  Td vaccine  Prostate cancer screening  Colorectal cancer screening  Patient Instructions (the written plan) was given to the patient.

## 2013-01-01 NOTE — Addendum Note (Signed)
Addended by: Willy Eddy on: 01/01/2013 12:48 PM   Modules accepted: Orders

## 2013-01-01 NOTE — Patient Instructions (Signed)
The patient is instructed to continue all medications as prescribed. Schedule followup with check out clerk upon leaving the clinic  

## 2013-01-02 LAB — VITAMIN D 25 HYDROXY (VIT D DEFICIENCY, FRACTURES): Vit D, 25-Hydroxy: 75 ng/mL (ref 30–89)

## 2013-01-09 ENCOUNTER — Telehealth: Payer: Self-pay | Admitting: *Deleted

## 2013-01-09 NOTE — Telephone Encounter (Signed)
Very concerned about elevated psa and testosterone levels- what to do?

## 2013-01-10 ENCOUNTER — Other Ambulatory Visit: Payer: Self-pay | Admitting: *Deleted

## 2013-01-10 MED ORDER — CIPROFLOXACIN HCL 500 MG PO TABS: 500.0000 mg | ORAL_TABLET | Freq: Two times a day (BID) | ORAL | Status: DC

## 2013-01-10 NOTE — Telephone Encounter (Signed)
The PSA has jumped before with inflamation Lets try cipro 500 for 2 weeks and repeat The testosterone is high Reduce by 1/2 the testosterone supplemtation to 2 pumps a day

## 2013-01-10 NOTE — Telephone Encounter (Signed)
cipro has been called in- waiting for md to let me know about androgel

## 2013-01-10 NOTE — Telephone Encounter (Signed)
Per dr Lovell Sheehan hold testosterone and check in 2 weeks

## 2013-01-15 DIAGNOSIS — M5430 Sciatica, unspecified side: Secondary | ICD-10-CM | POA: Diagnosis not present

## 2013-01-15 DIAGNOSIS — M999 Biomechanical lesion, unspecified: Secondary | ICD-10-CM | POA: Diagnosis not present

## 2013-01-15 DIAGNOSIS — M5137 Other intervertebral disc degeneration, lumbosacral region: Secondary | ICD-10-CM | POA: Diagnosis not present

## 2013-01-15 DIAGNOSIS — IMO0002 Reserved for concepts with insufficient information to code with codable children: Secondary | ICD-10-CM | POA: Diagnosis not present

## 2013-01-18 DIAGNOSIS — IMO0002 Reserved for concepts with insufficient information to code with codable children: Secondary | ICD-10-CM | POA: Diagnosis not present

## 2013-01-18 DIAGNOSIS — M999 Biomechanical lesion, unspecified: Secondary | ICD-10-CM | POA: Diagnosis not present

## 2013-01-18 DIAGNOSIS — M5137 Other intervertebral disc degeneration, lumbosacral region: Secondary | ICD-10-CM | POA: Diagnosis not present

## 2013-01-18 DIAGNOSIS — M5430 Sciatica, unspecified side: Secondary | ICD-10-CM | POA: Diagnosis not present

## 2013-01-22 DIAGNOSIS — M999 Biomechanical lesion, unspecified: Secondary | ICD-10-CM | POA: Diagnosis not present

## 2013-01-22 DIAGNOSIS — M5137 Other intervertebral disc degeneration, lumbosacral region: Secondary | ICD-10-CM | POA: Diagnosis not present

## 2013-01-22 DIAGNOSIS — IMO0002 Reserved for concepts with insufficient information to code with codable children: Secondary | ICD-10-CM | POA: Diagnosis not present

## 2013-01-22 DIAGNOSIS — M5430 Sciatica, unspecified side: Secondary | ICD-10-CM | POA: Diagnosis not present

## 2013-01-24 ENCOUNTER — Other Ambulatory Visit (INDEPENDENT_AMBULATORY_CARE_PROVIDER_SITE_OTHER): Payer: Medicare Other

## 2013-01-24 DIAGNOSIS — N529 Male erectile dysfunction, unspecified: Secondary | ICD-10-CM

## 2013-01-30 ENCOUNTER — Other Ambulatory Visit: Payer: Self-pay | Admitting: *Deleted

## 2013-01-31 ENCOUNTER — Other Ambulatory Visit (INDEPENDENT_AMBULATORY_CARE_PROVIDER_SITE_OTHER): Payer: Medicare Other

## 2013-01-31 ENCOUNTER — Other Ambulatory Visit: Payer: Self-pay | Admitting: *Deleted

## 2013-01-31 DIAGNOSIS — R972 Elevated prostate specific antigen [PSA]: Secondary | ICD-10-CM

## 2013-01-31 LAB — PSA: PSA: 4.45 ng/mL — ABNORMAL HIGH (ref 0.10–4.00)

## 2013-01-31 MED ORDER — THYROID 65 MG PO TABS: 65.0000 mg | ORAL_TABLET | Freq: Every day | ORAL | Status: DC

## 2013-02-05 ENCOUNTER — Telehealth: Payer: Self-pay | Admitting: Internal Medicine

## 2013-02-05 DIAGNOSIS — R972 Elevated prostate specific antigen [PSA]: Secondary | ICD-10-CM

## 2013-02-05 NOTE — Telephone Encounter (Signed)
PT is calling to discuss his lab results from 01/31/13 with his nurse or doctor. He has reviewed them on mychart already. Please assist.

## 2013-02-05 NOTE — Telephone Encounter (Signed)
Your psa remains elevated -on 7.30.14, it was 4.45 which was actually a little higher than a month ago. Therefore dr Lovell Sheehan would like to send you to a urologist for a consulttion.  I will send an order to nicole for a referral to [facility] and she will call you with the appointment.

## 2013-02-06 ENCOUNTER — Telehealth: Payer: Self-pay | Admitting: Internal Medicine

## 2013-02-06 NOTE — Telephone Encounter (Signed)
Order originally faxed 02/05/13. Re-faxed 02/06/13

## 2013-02-06 NOTE — Telephone Encounter (Signed)
Pt states [facility] has not received any office notes for referral. Pt states [facility] is having issues w/ their fax machine and would like to know if you could resend.

## 2013-02-07 ENCOUNTER — Other Ambulatory Visit: Payer: Self-pay

## 2013-02-19 ENCOUNTER — Other Ambulatory Visit: Payer: Self-pay | Admitting: Internal Medicine

## 2013-03-28 DIAGNOSIS — R972 Elevated prostate specific antigen [PSA]: Secondary | ICD-10-CM | POA: Diagnosis not present

## 2013-03-28 DIAGNOSIS — M1990 Unspecified osteoarthritis, unspecified site: Secondary | ICD-10-CM

## 2013-03-28 DIAGNOSIS — N529 Male erectile dysfunction, unspecified: Secondary | ICD-10-CM | POA: Diagnosis not present

## 2013-03-28 DIAGNOSIS — E291 Testicular hypofunction: Secondary | ICD-10-CM | POA: Diagnosis not present

## 2013-03-28 HISTORY — DX: Unspecified osteoarthritis, unspecified site: M19.90

## 2013-04-30 DIAGNOSIS — R972 Elevated prostate specific antigen [PSA]: Secondary | ICD-10-CM | POA: Diagnosis not present

## 2013-04-30 DIAGNOSIS — C61 Malignant neoplasm of prostate: Secondary | ICD-10-CM | POA: Diagnosis not present

## 2013-05-10 ENCOUNTER — Other Ambulatory Visit: Payer: Self-pay

## 2013-05-14 DIAGNOSIS — C61 Malignant neoplasm of prostate: Secondary | ICD-10-CM | POA: Diagnosis not present

## 2013-05-14 DIAGNOSIS — E291 Testicular hypofunction: Secondary | ICD-10-CM | POA: Diagnosis not present

## 2013-05-21 ENCOUNTER — Encounter: Payer: Self-pay | Admitting: Internal Medicine

## 2013-05-24 ENCOUNTER — Other Ambulatory Visit: Payer: Self-pay | Admitting: Urology

## 2013-05-24 DIAGNOSIS — C61 Malignant neoplasm of prostate: Secondary | ICD-10-CM

## 2013-06-25 ENCOUNTER — Ambulatory Visit: Payer: Medicare Other | Admitting: Internal Medicine

## 2013-06-25 DIAGNOSIS — H26019 Infantile and juvenile cortical, lamellar, or zonular cataract, unspecified eye: Secondary | ICD-10-CM | POA: Diagnosis not present

## 2013-06-25 DIAGNOSIS — H2510 Age-related nuclear cataract, unspecified eye: Secondary | ICD-10-CM | POA: Diagnosis not present

## 2013-07-02 ENCOUNTER — Ambulatory Visit (INDEPENDENT_AMBULATORY_CARE_PROVIDER_SITE_OTHER): Payer: Medicare Other | Admitting: Internal Medicine

## 2013-07-02 ENCOUNTER — Encounter: Payer: Self-pay | Admitting: Internal Medicine

## 2013-07-02 VITALS — BP 140/84 | HR 76 | Temp 98.2°F | Resp 16 | Ht 70.0 in | Wt 172.0 lb

## 2013-07-02 DIAGNOSIS — C61 Malignant neoplasm of prostate: Secondary | ICD-10-CM | POA: Diagnosis not present

## 2013-07-02 DIAGNOSIS — E785 Hyperlipidemia, unspecified: Secondary | ICD-10-CM | POA: Diagnosis not present

## 2013-07-02 DIAGNOSIS — E039 Hypothyroidism, unspecified: Secondary | ICD-10-CM | POA: Diagnosis not present

## 2013-07-02 LAB — HEPATIC FUNCTION PANEL
ALT: 27 U/L (ref 0–53)
Bilirubin, Direct: 0.1 mg/dL (ref 0.0–0.3)
Total Bilirubin: 1.3 mg/dL — ABNORMAL HIGH (ref 0.3–1.2)
Total Protein: 7.5 g/dL (ref 6.0–8.3)

## 2013-07-02 LAB — LIPID PANEL
Cholesterol: 202 mg/dL — ABNORMAL HIGH (ref 0–200)
HDL: 58.7 mg/dL (ref >39.00)
Total CHOL/HDL Ratio: 3
VLDL: 23 mg/dL (ref 0.0–40.0)

## 2013-07-02 LAB — LDL CHOLESTEROL, DIRECT: Direct LDL: 122.5 mg/dL

## 2013-07-02 MED ORDER — OMEGA-3-ACID ETHYL ESTERS 1 G PO CAPS: ORAL_CAPSULE | ORAL | Status: DC

## 2013-07-02 NOTE — Progress Notes (Signed)
Pre visit review using our clinic review tool, if applicable. No additional management support is needed unless otherwise documented below in the visit note. 

## 2013-07-02 NOTE — Addendum Note (Signed)
Addended by: Bonnye Fava on: 07/02/2013 12:34 PM   Modules accepted: Orders

## 2013-07-02 NOTE — Patient Instructions (Signed)
Stop the testosterone

## 2013-07-02 NOTE — Progress Notes (Signed)
Subjective:    Patient ID: Ryan [name], male    DOB: Sep 19, 1945, 67 y.o.   MRN: [id number]  Hypertension Pertinent negatives include no neck pain or shortness of breath.  Hyperlipidemia Pertinent negatives include no shortness of breath.   Gleason 3+3 with MRI and watching Discussion of testosterone and the arimedex    Review of Systems  Constitutional: Negative for fever and fatigue.  HENT: Negative for congestion, hearing loss and postnasal drip.   Eyes: Negative for discharge, redness and visual disturbance.  Respiratory: Negative for cough, shortness of breath and wheezing.   Cardiovascular: Negative for leg swelling.  Gastrointestinal: Negative for abdominal pain, constipation and abdominal distention.  Genitourinary: Negative for urgency and frequency.  Musculoskeletal: Negative for arthralgias, joint swelling and neck pain.  Skin: Negative for color change and rash.  Neurological: Negative for weakness and light-headedness.  Hematological: Negative for adenopathy.  Psychiatric/Behavioral: Negative for behavioral problems.   Past Medical History  Diagnosis Date  . Hyperlipidemia   . Hypertension   . Back pain   . Fungal nail infection     History   Social History  . Marital Status: Married    Spouse Name: N/A    Number of Children: N/A  . Years of Education: N/A   Occupational History  . Not on file.   Social History Main Topics  . Smoking status: Former Smoker    Quit date: 07/05/1981  . Smokeless tobacco: Never Used  . Alcohol Use: Yes  . Drug Use: No  . Sexual Activity: Not on file   Other Topics Concern  . Not on file   Social History Narrative  . No narrative on file    Past Surgical History  Procedure Laterality Date  . Fungal nail    . Ankle surgery trauma    . Eye lid surgery      Family History  Problem Relation Age of Onset  . Heart disease Mother   . Liver disease Father   . Alcohol abuse Father   . Cancer Neg Hx      No Known Allergies  Current Outpatient Prescriptions on File Prior to Visit  Medication Sig Dispense Refill  . acyclovir (ZOVIRAX) 400 MG tablet Take 400 mg by mouth daily.      . Ascorbic Acid (VITAMIN C) 1000 MG tablet Take 1,000 mg by mouth daily.        . Cholecalciferol (VITAMIN D) 1000 UNITS capsule Take 1,000 Units by mouth daily.        Marland Kitchen co-enzyme Q-10 30 MG capsule Take 100 mg by mouth 2 (two) times daily.       . Cyanocobalamin (VITAMIN B-12 CR) 1500 MCG TBCR Take by mouth.        . Digestive Enzymes (DIGESTIVE ENZYME PO) Take 1 tablet by mouth 3 (three) times daily after meals.      . magnesium hydroxide (MILK OF MAGNESIA) 400 MG/5ML suspension Take by mouth daily.        . Milk Thistle 150 MG CAPS Take by mouth. 375mg  per day       . Olive Leaf Extract 500 MG CAPS Take by mouth daily.        Marland Kitchen omega-3 acid ethyl esters (LOVAZA) 1 G capsule TAKE 2 CAPSULES TWICE A DAY  360 capsule  2  . Red Yeast Rice 600 MG TABS Take 4 tablets by mouth at bedtime.       Marland Kitchen thyroid (ARMOUR) 65 MG tablet Take  1 tablet (65 mg total) by mouth daily.  90 tablet  3  . vitamin E 400 UNIT capsule Take 400 Units by mouth daily.        . Zinc 50 MG CAPS Take by mouth.        . Testosterone (ANDROGEL PUMP) 1.25 GM/ACT (1%) GEL Apply 4 Squirts topically daily.  75 g  5   No current facility-administered medications on file prior to visit.    BP 140/84  Pulse 76  Temp(Src) 98.2 F (36.8 C)  Resp 16  Ht 5\' 10"  (1.778 m)  Wt 172 lb (78.019 kg)  BMI 24.68 kg/m2        Objective:   Physical Exam  Nursing note and vitals reviewed. Constitutional: He appears well-developed and well-nourished.  HENT:  Head: Normocephalic and atraumatic.  Eyes: Conjunctivae are normal. Pupils are equal, round, and reactive to light.  Neck: Normal range of motion. Neck supple.  Cardiovascular: Normal rate and regular rhythm.   Pulmonary/Chest: Effort normal and breath sounds normal.  Abdominal: Soft. Bowel  sounds are normal.          Assessment & Plan:  Stopping all Hormone modulation for now  discusswed ion of watchfully waiting on the PSA and prostate

## 2013-07-13 ENCOUNTER — Ambulatory Visit: Payer: Medicare Other | Admitting: Internal Medicine

## 2013-07-16 ENCOUNTER — Ambulatory Visit (INDEPENDENT_AMBULATORY_CARE_PROVIDER_SITE_OTHER): Payer: Medicare Other | Admitting: Internal Medicine

## 2013-07-16 DIAGNOSIS — Z23 Encounter for immunization: Secondary | ICD-10-CM

## 2013-07-31 ENCOUNTER — Other Ambulatory Visit: Payer: Self-pay | Admitting: *Deleted

## 2013-08-17 DIAGNOSIS — C61 Malignant neoplasm of prostate: Secondary | ICD-10-CM | POA: Diagnosis not present

## 2013-08-22 ENCOUNTER — Other Ambulatory Visit: Payer: Self-pay | Admitting: Urology

## 2013-08-22 ENCOUNTER — Ambulatory Visit (HOSPITAL_COMMUNITY)
Admission: RE | Admit: 2013-08-22 | Discharge: 2013-08-22 | Disposition: A | Discharge status: Home / Self Care | Payer: Medicare Other | Source: Ambulatory Visit | Attending: Urology | Admitting: Urology

## 2013-08-22 DIAGNOSIS — R972 Elevated prostate specific antigen [PSA]: Secondary | ICD-10-CM | POA: Diagnosis not present

## 2013-08-22 DIAGNOSIS — C61 Malignant neoplasm of prostate: Secondary | ICD-10-CM

## 2013-08-22 DIAGNOSIS — Z77018 Contact with and (suspected) exposure to other hazardous metals: Secondary | ICD-10-CM | POA: Diagnosis not present

## 2013-08-22 DIAGNOSIS — Z135 Encounter for screening for eye and ear disorders: Secondary | ICD-10-CM | POA: Diagnosis not present

## 2013-08-22 DIAGNOSIS — N3289 Other specified disorders of bladder: Secondary | ICD-10-CM | POA: Diagnosis not present

## 2013-08-22 DIAGNOSIS — Z1389 Encounter for screening for other disorder: Secondary | ICD-10-CM | POA: Insufficient documentation

## 2013-08-22 LAB — CREATININE, SERUM
Creatinine, Ser: 0.96 mg/dL (ref 0.50–1.35)
GFR calc non Af Amer: 84 mL/min — ABNORMAL LOW (ref >90)

## 2013-08-22 MED ORDER — GADOBENATE DIMEGLUMINE 529 MG/ML IV SOLN
15.0000 mL | Freq: Once | INTRAVENOUS | Status: AC | PRN
  Administered 2013-08-22: 15 mL via INTRAVENOUS

## 2013-08-24 DIAGNOSIS — C61 Malignant neoplasm of prostate: Secondary | ICD-10-CM | POA: Diagnosis not present

## 2013-08-24 DIAGNOSIS — N529 Male erectile dysfunction, unspecified: Secondary | ICD-10-CM | POA: Diagnosis not present

## 2013-09-14 DIAGNOSIS — C61 Malignant neoplasm of prostate: Secondary | ICD-10-CM | POA: Diagnosis not present

## 2013-09-14 DIAGNOSIS — R972 Elevated prostate specific antigen [PSA]: Secondary | ICD-10-CM | POA: Diagnosis not present

## 2013-09-14 DIAGNOSIS — IMO0002 Reserved for concepts with insufficient information to code with codable children: Secondary | ICD-10-CM | POA: Diagnosis not present

## 2013-09-24 ENCOUNTER — Telehealth: Payer: Self-pay | Admitting: Internal Medicine

## 2013-09-24 MED ORDER — PROMETHAZINE HCL 50 MG PO TABS: 50.0000 mg | ORAL_TABLET | Freq: Four times a day (QID) | ORAL | Status: DC | PRN

## 2013-09-24 MED ORDER — CIPROFLOXACIN HCL 500 MG PO TABS: 500.0000 mg | ORAL_TABLET | Freq: Two times a day (BID) | ORAL | Status: DC

## 2013-09-24 NOTE — Telephone Encounter (Signed)
Per Dr Arnoldo Morale call in cipro 500 mg bid #14, phenergan 50 mg #6, and tell them to get some imodium, rx sent in electronically, pt aware

## 2013-09-24 NOTE — Telephone Encounter (Signed)
wife states she spoke w/ dr j about some "travel meds" samples in case of diarrhea etc. And he told her to call and he would get together a sample pack  For her and pt. pls advise. They are traveling to [place] for 10 days

## 2013-11-08 ENCOUNTER — Other Ambulatory Visit: Payer: Self-pay | Admitting: Internal Medicine

## 2013-11-21 ENCOUNTER — Encounter: Payer: Self-pay | Admitting: Internal Medicine

## 2013-12-11 ENCOUNTER — Other Ambulatory Visit: Payer: Self-pay | Admitting: Internal Medicine

## 2013-12-19 DIAGNOSIS — C61 Malignant neoplasm of prostate: Secondary | ICD-10-CM | POA: Diagnosis not present

## 2013-12-26 ENCOUNTER — Ambulatory Visit (INDEPENDENT_AMBULATORY_CARE_PROVIDER_SITE_OTHER): Payer: Medicare Other | Admitting: Family Medicine

## 2013-12-26 ENCOUNTER — Encounter: Payer: Self-pay | Admitting: Family Medicine

## 2013-12-26 VITALS — BP 130/76 | HR 69 | Wt 170.0 lb

## 2013-12-26 DIAGNOSIS — Z711 Person with feared health complaint in whom no diagnosis is made: Secondary | ICD-10-CM

## 2013-12-26 DIAGNOSIS — B839 Helminthiasis, unspecified: Secondary | ICD-10-CM | POA: Diagnosis not present

## 2013-12-26 DIAGNOSIS — E291 Testicular hypofunction: Secondary | ICD-10-CM | POA: Diagnosis not present

## 2013-12-26 DIAGNOSIS — C61 Malignant neoplasm of prostate: Secondary | ICD-10-CM | POA: Diagnosis not present

## 2013-12-26 MED ORDER — MEBENDAZOLE 100 MG PO CHEW: CHEWABLE_TABLET | ORAL | Status: DC

## 2013-12-26 MED ORDER — ALBENDAZOLE 200 MG PO TABS: 200.0000 mg | ORAL_TABLET | Freq: Every day | ORAL | Status: DC

## 2013-12-26 NOTE — Progress Notes (Signed)
   Subjective:    Patient ID: Ryan [name], male    DOB: 01-18-1946, 68 y.o.   MRN: [id number]  HPI Patient seen with possible parasitic infection. He and his wife were in [place] back in April. He does not recall having any nausea vomiting or diarrhea that time. He's had some occasional lower abdominal cramping and recently noted what he described as a possible parasite floating in the water of toilet bowl. He has not had any persistent diarrhea symptoms. No appetite or weight changes. No fevers or chills.  Past Medical History  Diagnosis Date  . Hyperlipidemia   . Hypertension   . Back pain   . Fungal nail infection    Past Surgical History  Procedure Laterality Date  . Fungal nail    . Ankle surgery trauma    . Eye lid surgery      reports that he quit smoking about 32 years ago. He has never used smokeless tobacco. He reports that he drinks alcohol. He reports that he does not use illicit drugs. family history includes Alcohol abuse in his father; Heart disease in his mother; Liver disease in his father. There is no history of Cancer. No Known Allergies    Review of Systems  Constitutional: Negative for fever and chills.  Respiratory: Negative for cough.   Gastrointestinal: Negative for nausea, vomiting, abdominal pain, diarrhea and blood in stool.  Skin: Negative for rash.       Objective:   Physical Exam  Constitutional: He appears well-developed and well-nourished.  Cardiovascular: Normal rate and regular rhythm.   Pulmonary/Chest: Effort normal and breath sounds normal. No respiratory distress. He has no wheezes. He has no rales.  Abdominal: Soft. Bowel sounds are normal. He exhibits no distension and no mass. There is no tenderness. There is no rebound and no guarding.          Assessment & Plan:  Concern for possible intestinal parasite. He brings in a bottle today with a live ?worm which is not  Consistent with pinworm or roundworm. We'll check to see if  we can have this analyzed. Not clear if this is actually a parasite. Recommended empiric treatment with mebendazole 100 mg and repeat in 2 weeks pending further laboratory assessment

## 2013-12-26 NOTE — Progress Notes (Signed)
Pre visit review using our clinic review tool, if applicable. No additional management support is needed unless otherwise documented below in the visit note. 

## 2013-12-27 LAB — ARTHROPOD IDENTIFICATION

## 2014-01-01 ENCOUNTER — Telehealth: Payer: Self-pay | Admitting: Internal Medicine

## 2014-01-01 NOTE — Telephone Encounter (Signed)
Pt would like to switch to dr kim. Can I sch? °

## 2014-01-02 NOTE — Telephone Encounter (Signed)
I advise she she one of the providers taking over for Dr. Arnoldo Morale (Garret Reddish or Roslynn Amble).

## 2014-01-02 NOTE — Telephone Encounter (Signed)
lmom for pt to cb

## 2014-01-02 NOTE — Telephone Encounter (Signed)
Montrice pt wife said she spoke with you yesterday and  Per wife  dr Elease Hashimoto will accept her hus as new pt from [facility]. Please confirm

## 2014-01-02 NOTE — Telephone Encounter (Signed)
Pt has been sch

## 2014-01-02 NOTE — Telephone Encounter (Signed)
Yes husband is fine per Dr. Elease Hashimoto.

## 2014-01-21 ENCOUNTER — Encounter: Payer: Medicare Other | Admitting: Internal Medicine

## 2014-02-14 ENCOUNTER — Encounter: Payer: Self-pay | Admitting: Family Medicine

## 2014-02-14 ENCOUNTER — Ambulatory Visit (INDEPENDENT_AMBULATORY_CARE_PROVIDER_SITE_OTHER): Payer: Medicare Other | Admitting: Family Medicine

## 2014-02-14 VITALS — BP 134/74 | HR 72 | Temp 97.5°F | Ht 70.0 in | Wt 169.0 lb

## 2014-02-14 DIAGNOSIS — C61 Malignant neoplasm of prostate: Secondary | ICD-10-CM | POA: Insufficient documentation

## 2014-02-14 DIAGNOSIS — E039 Hypothyroidism, unspecified: Secondary | ICD-10-CM

## 2014-02-14 DIAGNOSIS — Z0000 Encounter for general adult medical examination without abnormal findings: Secondary | ICD-10-CM | POA: Diagnosis not present

## 2014-02-14 DIAGNOSIS — E785 Hyperlipidemia, unspecified: Secondary | ICD-10-CM | POA: Diagnosis not present

## 2014-02-14 LAB — HEPATIC FUNCTION PANEL
ALBUMIN: 4.5 g/dL (ref 3.5–5.2)
ALK PHOS: 64 U/L (ref 39–117)
ALT: 31 U/L (ref 0–53)
AST: 28 U/L (ref 0–37)
Bilirubin, Direct: 0 mg/dL (ref 0.0–0.3)
TOTAL PROTEIN: 7.4 g/dL (ref 6.0–8.3)
Total Bilirubin: 1.1 mg/dL (ref 0.2–1.2)

## 2014-02-14 LAB — LIPID PANEL
CHOL/HDL RATIO: 4
Cholesterol: 196 mg/dL (ref 0–200)
HDL: 55.1 mg/dL (ref >39.00)
LDL Cholesterol: 122 mg/dL — ABNORMAL HIGH (ref 0–99)
NonHDL: 140.9
TRIGLYCERIDES: 93 mg/dL (ref 0.0–149.0)
VLDL: 18.6 mg/dL (ref 0.0–40.0)

## 2014-02-14 LAB — TSH: TSH: 0.42 u[IU]/mL (ref 0.35–4.50)

## 2014-02-14 NOTE — Progress Notes (Signed)
Subjective:    Patient ID: Ryan [name], male    DOB: 1945-09-06, 68 y.o.   MRN: [id number]  HPI Patient here for Medicare wellness exam and medical followup. He had past history of hypertension currently controlled off medication. Hypothyroidism and takes Armour Thyroid. Compliant with therapy. History of prostate cancer. He is followed regularly by urologist. He has chronic hearing loss with bilateral hearing aids. Hyperlipidemia currently not treated with medication. Previously took Angelica. No history of CAD or peripheral vascular disease. He does take red yeast rice extract.  Patient is a nonsmoker. No regular exercise. Colonoscopy 2007. Immunizations up to date. He refuses flu vaccine.  Past Medical History  Diagnosis Date  . Hyperlipidemia   . Hypertension   . Back pain   . Fungal nail infection    Past Surgical History  Procedure Laterality Date  . Fungal nail    . Ankle surgery trauma    . Eye lid surgery      reports that he quit smoking about 32 years ago. He has never used smokeless tobacco. He reports that he drinks alcohol. He reports that he does not use illicit drugs. family history includes Alcohol abuse in his father; Cancer in his father; Heart disease in his mother; Liver disease in his father. No Known Allergies  1.  Risk factors based on Past Medical , Social, and Family history reviewed and as indicated above with no changes 2.  Limitations in physical activities None.  No recent falls. 3.  Depression/mood No active depression or anxiety issues 4.  Hearing chronic loss with hearing aids 5.  ADLs independent in all. 6.  Cognitive function (orientation to time and place, language, writing, speech,memory) no short or long term memory issues.  Language and judgement intact. 7.  Home Safety no issues 8.  Height, weight, and visual acuity.all stable. 9.  Counseling discussed the importance of more consistent exercise 10. Recommendation of preventive  services. Recommendation for flu vaccine which she refuses 11. Labs based on risk factors TSH, lipid, and hepatic 12. Care Plan would recommend a flu vaccine. Labs as above. 13. Other Providers Dr. Diona Fanti (Urology) 14. Written schedule of screening/prevention services given to patient. Repeat colonoscopy in 2 years. Recommend yearly flu vaccine though he refuses. he's already had pneumonia vaccine    Review of Systems  Constitutional: Negative for fever, activity change, appetite change and fatigue.  HENT: Negative for congestion, ear pain and trouble swallowing.   Eyes: Negative for pain and visual disturbance.  Respiratory: Negative for cough, shortness of breath and wheezing.   Cardiovascular: Negative for chest pain and palpitations.  Gastrointestinal: Negative for nausea, vomiting, abdominal pain, diarrhea, constipation, blood in stool, abdominal distention and rectal pain.  Genitourinary: Negative for dysuria, hematuria and testicular pain.  Musculoskeletal: Negative for arthralgias and joint swelling.  Skin: Negative for rash.  Neurological: Negative for dizziness, syncope and headaches.  Hematological: Negative for adenopathy.  Psychiatric/Behavioral: Negative for confusion and dysphoric mood.       Objective:   Physical Exam  Constitutional: He is oriented to person, place, and time. He appears well-developed and well-nourished. No distress.  HENT:  Head: Normocephalic and atraumatic.  Right Ear: External ear normal.  Left Ear: External ear normal.  Mouth/Throat: Oropharynx is clear and moist.  Eyes: Conjunctivae and EOM are normal. Pupils are equal, round, and reactive to light.  Neck: Normal range of motion. Neck supple. No thyromegaly present.  Cardiovascular: Normal rate, regular rhythm and normal heart  sounds.   No murmur heard. Pulmonary/Chest: No respiratory distress. He has no wheezes. He has no rales.  Abdominal: Soft. Bowel sounds are normal. He exhibits no  distension and no mass. There is no tenderness. There is no rebound and no guarding.  Genitourinary:  Per urology  Musculoskeletal: He exhibits no edema.  Lymphadenopathy:    He has no cervical adenopathy.  Neurological: He is alert and oriented to person, place, and time. He displays normal reflexes. No cranial nerve deficit.  Skin: No rash noted.  Psychiatric: He has a normal mood and affect.          Assessment & Plan:  #1 health maintenance. Recommend flu vaccine and he declines. Other immunizations up-to-date. Repeat colonoscopy in 2 years #2 hypothyroidism. Recheck TSH #3 hyperlipidemia history. Recheck fasting lipid panel. #4 prostate cancer followed by urology. No recent obstructive symptoms

## 2014-02-14 NOTE — Progress Notes (Signed)
Pre visit review using our clinic review tool, if applicable. No additional management support is needed unless otherwise documented below in the visit note. 

## 2014-02-14 NOTE — Patient Instructions (Signed)
Engage in regular exercise. You will need repeat colonoscopy in 2 years We do recommend yearly flu vaccine if you change your mind.

## 2014-02-15 ENCOUNTER — Encounter: Payer: Self-pay | Admitting: Family Medicine

## 2014-03-11 ENCOUNTER — Other Ambulatory Visit: Payer: Self-pay | Admitting: Internal Medicine

## 2014-06-25 DIAGNOSIS — C61 Malignant neoplasm of prostate: Secondary | ICD-10-CM | POA: Diagnosis not present

## 2014-07-03 DIAGNOSIS — E291 Testicular hypofunction: Secondary | ICD-10-CM | POA: Diagnosis not present

## 2014-07-03 DIAGNOSIS — A6000 Herpesviral infection of urogenital system, unspecified: Secondary | ICD-10-CM | POA: Diagnosis not present

## 2014-07-03 DIAGNOSIS — C61 Malignant neoplasm of prostate: Secondary | ICD-10-CM | POA: Diagnosis not present

## 2014-07-03 DIAGNOSIS — N529 Male erectile dysfunction, unspecified: Secondary | ICD-10-CM | POA: Diagnosis not present

## 2014-07-05 DIAGNOSIS — B009 Herpesviral infection, unspecified: Secondary | ICD-10-CM

## 2014-07-05 HISTORY — DX: Herpesviral infection, unspecified: B00.9

## 2014-07-19 DIAGNOSIS — H2513 Age-related nuclear cataract, bilateral: Secondary | ICD-10-CM | POA: Diagnosis not present

## 2014-08-13 ENCOUNTER — Encounter: Payer: Self-pay | Admitting: Family Medicine

## 2014-08-13 ENCOUNTER — Ambulatory Visit (INDEPENDENT_AMBULATORY_CARE_PROVIDER_SITE_OTHER): Payer: Medicare Other | Admitting: Family Medicine

## 2014-08-13 VITALS — BP 128/78 | HR 81 | Temp 97.9°F | Wt 165.0 lb

## 2014-08-13 DIAGNOSIS — E038 Other specified hypothyroidism: Secondary | ICD-10-CM | POA: Diagnosis not present

## 2014-08-13 DIAGNOSIS — I10 Essential (primary) hypertension: Secondary | ICD-10-CM | POA: Diagnosis not present

## 2014-08-13 DIAGNOSIS — E785 Hyperlipidemia, unspecified: Secondary | ICD-10-CM

## 2014-08-13 NOTE — Patient Instructions (Signed)
Consider Prevnar 13 vaccine at some point this year.   

## 2014-08-13 NOTE — Progress Notes (Signed)
Pre visit review using our clinic review tool, if applicable. No additional management support is needed unless otherwise documented below in the visit note. 

## 2014-08-13 NOTE — Progress Notes (Signed)
   Subjective:    Patient ID: Ryan [name], male    DOB: Dec 02, 1945, 69 y.o.   MRN: [id number]  HPI Patient seen for medical follow-up. He has history of prostate cancer which has been observed by urology without treatment thus far. He has hypothyroidism, history of mild hyperlipidemia, borderline elevated blood pressure. He had recent follow-up at [place] and had labs there. These are reviewed today. These are all fairly favorable. He declines flu vaccine. He needs Prevnar 13 and plans to get this through the [place] [facility]. Other immunizations up-to-date.  Medications reviewed. Compliant with all. No recent chest pains. No consistent exercise recently. His 10 year risk for CAD event is 8%. He declines statin therapy  Past Medical History  Diagnosis Date  . Hyperlipidemia   . Hypertension   . Back pain   . Fungal nail infection    Past Surgical History  Procedure Laterality Date  . Fungal nail    . Ankle surgery trauma    . Eye lid surgery      reports that he quit smoking about 33 years ago. He has never used smokeless tobacco. He reports that he drinks alcohol. He reports that he does not use illicit drugs. family history includes Alcohol abuse in his father; Cancer in his father; Heart disease in his mother; Liver disease in his father. No Known Allergies    Review of Systems  Constitutional: Negative for fatigue.  Eyes: Negative for visual disturbance.  Respiratory: Negative for cough, chest tightness and shortness of breath.   Cardiovascular: Negative for chest pain, palpitations and leg swelling.  Neurological: Negative for dizziness, syncope, weakness, light-headedness and headaches.       Objective:   Physical Exam  Constitutional: He is oriented to person, place, and time. He appears well-developed and well-nourished.  HENT:  Right Ear: External ear normal.  Left Ear: External ear normal.  Mouth/Throat: Oropharynx is clear and moist.  Eyes: Pupils  are equal, round, and reactive to light.  Neck: Neck supple. No thyromegaly present.  Cardiovascular: Normal rate and regular rhythm.   Pulmonary/Chest: Effort normal and breath sounds normal. No respiratory distress. He has no wheezes. He has no rales.  Musculoskeletal: He exhibits no edema.  Neurological: He is alert and oriented to person, place, and time.          Assessment & Plan:  #1 borderline elevated blood pressure. Repeat by me 120/78. Observe for now. Continue regular aerobic exercise #2 history of hyperlipidemia. Excellent HDL 73. 10 year risk of CAD is 8%. He is not interested in statin therapy.  He will consider baby ASA use.  #3 hypothyroidism recent TSH at goal. Continue current dose of thyroid supplement #4 health maintenance. We recommend a flu vaccine and declines. Needs Prevnar 13 he prefers to get the [place]

## 2014-08-20 ENCOUNTER — Telehealth: Payer: Self-pay | Admitting: Family Medicine

## 2014-08-20 NOTE — Telephone Encounter (Signed)
emmi emailed °

## 2014-12-26 DIAGNOSIS — C61 Malignant neoplasm of prostate: Secondary | ICD-10-CM | POA: Diagnosis not present

## 2015-01-03 DIAGNOSIS — C61 Malignant neoplasm of prostate: Secondary | ICD-10-CM | POA: Diagnosis not present

## 2015-02-11 ENCOUNTER — Ambulatory Visit (INDEPENDENT_AMBULATORY_CARE_PROVIDER_SITE_OTHER): Payer: Medicare Other | Admitting: Family Medicine

## 2015-02-11 ENCOUNTER — Encounter: Payer: Self-pay | Admitting: Family Medicine

## 2015-02-11 VITALS — BP 130/70 | HR 65 | Temp 97.7°F | Wt 161.0 lb

## 2015-02-11 DIAGNOSIS — E785 Hyperlipidemia, unspecified: Secondary | ICD-10-CM

## 2015-02-11 DIAGNOSIS — I10 Essential (primary) hypertension: Secondary | ICD-10-CM

## 2015-02-11 DIAGNOSIS — E038 Other specified hypothyroidism: Secondary | ICD-10-CM | POA: Diagnosis not present

## 2015-02-11 DIAGNOSIS — R972 Elevated prostate specific antigen [PSA]: Secondary | ICD-10-CM | POA: Diagnosis not present

## 2015-02-11 LAB — LIPID PANEL
CHOL/HDL RATIO: 3
Cholesterol: 174 mg/dL (ref 0–200)
HDL: 60.7 mg/dL (ref >39.00)
LDL Cholesterol: 98 mg/dL (ref 0–99)
NONHDL: 113.19
TRIGLYCERIDES: 74 mg/dL (ref 0.0–149.0)
VLDL: 14.8 mg/dL (ref 0.0–40.0)

## 2015-02-11 LAB — HEPATIC FUNCTION PANEL
ALT: 25 U/L (ref 0–53)
AST: 23 U/L (ref 0–37)
Albumin: 4.4 g/dL (ref 3.5–5.2)
Alkaline Phosphatase: 64 U/L (ref 39–117)
BILIRUBIN DIRECT: 0.2 mg/dL (ref 0.0–0.3)
BILIRUBIN TOTAL: 0.9 mg/dL (ref 0.2–1.2)
TOTAL PROTEIN: 7 g/dL (ref 6.0–8.3)

## 2015-02-11 NOTE — Progress Notes (Signed)
   Subjective:    Patient ID: Ryan [name], male    DOB: 09/10/1945, 69 y.o.   MRN: [id number]  HPI Patient seen to discuss several things  Hyperlipidemia. He takes Reddy's rice extract over-the-counter. He is requesting repeat lipid panel. These were checked in December his [place] [facility] but he is requesting this be repeated. He is concerned about potential for liver dysfunction though he has not had any problems with this previously. He is refused statin use. No history of CAD.  Long history of elevated PSA. His PSA came down from 4.15-1.93 recently per urology and he is very excited about that  Hypothyroidism. Treated with Armour thyroid with recent TSH 0.22. They plan to repeat his TSH in 3 months.  History of borderline elevated blood pressure. By home readings, very stable with mostly systolics around 235 and frequently less. He is walking some for exercise and does some gardening. No chest pains. No dizziness.  Past Medical History  Diagnosis Date  . Hyperlipidemia   . Hypertension   . Back pain   . Fungal nail infection    Past Surgical History  Procedure Laterality Date  . Fungal nail    . Ankle surgery trauma    . Eye lid surgery      reports that he quit smoking about 33 years ago. He has never used smokeless tobacco. He reports that he drinks alcohol. He reports that he does not use illicit drugs. family history includes Alcohol abuse in his father; Cancer in his father; Heart disease in his mother; Liver disease in his father. No Known Allergies    Review of Systems  Constitutional: Negative for chills, fatigue and unexpected weight change.  Eyes: Negative for visual disturbance.  Respiratory: Negative for cough, chest tightness and shortness of breath.   Cardiovascular: Negative for chest pain, palpitations and leg swelling.  Endocrine: Negative for polydipsia and polyuria.  Genitourinary: Negative for dysuria.  Neurological: Negative for dizziness,  syncope, weakness, light-headedness and headaches.       Objective:   Physical Exam  Constitutional: He is oriented to person, place, and time. He appears well-developed and well-nourished.  HENT:  Right Ear: External ear normal.  Left Ear: External ear normal.  Mouth/Throat: Oropharynx is clear and moist.  Eyes: Pupils are equal, round, and reactive to light.  Neck: Neck supple. No thyromegaly present.  Cardiovascular: Normal rate and regular rhythm.   Pulmonary/Chest: Effort normal and breath sounds normal. No respiratory distress. He has no wheezes. He has no rales.  Musculoskeletal: He exhibits no edema.  Neurological: He is alert and oriented to person, place, and time.          Assessment & Plan:  #1 history of borderline elevated blood pressure. Excellent readings today repeat left arm seated 118/70. Continue close monitoring. Continue regular aerobic exercise #2 recent slightly low TSH. He plans to get repeat through [place] system #3 hyperlipidemia. He's been opposed statin use. Takes over-the-counter red yeast rice extract. Requesting repeat lipid and hepatic panel #4 history of elevated PSA followed by urology with recent decrease to 1.93. No BPH symptoms

## 2015-02-11 NOTE — Progress Notes (Signed)
Pre visit review using our clinic review tool, if applicable. No additional management support is needed unless otherwise documented below in the visit note. 

## 2015-05-05 ENCOUNTER — Other Ambulatory Visit: Payer: Self-pay | Admitting: Family Medicine

## 2015-05-05 MED ORDER — ACYCLOVIR 400 MG PO TABS: 400.0000 mg | ORAL_TABLET | Freq: Every day | ORAL | Status: DC

## 2015-06-18 DIAGNOSIS — C61 Malignant neoplasm of prostate: Secondary | ICD-10-CM | POA: Diagnosis not present

## 2015-07-02 ENCOUNTER — Encounter: Payer: Self-pay | Admitting: *Deleted

## 2015-07-02 DIAGNOSIS — C61 Malignant neoplasm of prostate: Secondary | ICD-10-CM | POA: Diagnosis not present

## 2015-07-02 DIAGNOSIS — N5201 Erectile dysfunction due to arterial insufficiency: Secondary | ICD-10-CM | POA: Diagnosis not present

## 2015-07-06 DIAGNOSIS — E291 Testicular hypofunction: Secondary | ICD-10-CM

## 2015-07-06 HISTORY — DX: Testicular hypofunction: E29.1

## 2015-07-16 DIAGNOSIS — C61 Malignant neoplasm of prostate: Secondary | ICD-10-CM | POA: Diagnosis not present

## 2015-08-15 ENCOUNTER — Ambulatory Visit (INDEPENDENT_AMBULATORY_CARE_PROVIDER_SITE_OTHER): Payer: Medicare Other | Admitting: Family Medicine

## 2015-08-15 ENCOUNTER — Encounter: Payer: Self-pay | Admitting: Family Medicine

## 2015-08-15 VITALS — BP 120/80 | HR 83 | Temp 98.3°F | Ht 70.0 in | Wt 160.8 lb

## 2015-08-15 DIAGNOSIS — E039 Hypothyroidism, unspecified: Secondary | ICD-10-CM | POA: Diagnosis not present

## 2015-08-15 DIAGNOSIS — E785 Hyperlipidemia, unspecified: Secondary | ICD-10-CM

## 2015-08-15 NOTE — Progress Notes (Deleted)
   Subjective:    Patient ID: Ryan [name], male    DOB: [birth year], 70 y.o.   MRN: [id number]  HPI    Review of Systems     Objective:   Physical Exam        Assessment & Plan:

## 2015-08-15 NOTE — Progress Notes (Signed)
   Subjective:    Patient ID: Ryan [name], male    DOB: [birth year], 70 y.o.   MRN: [id number]  HPI Patient seen for medical follow-up. [facility] and patient reportedly had normal TSH back in October 2016. We do not have a copy of his labs at this point.  Hyperlipidemia. He has been able to manage this with lifestyle modification. He and his wife are very diligent regarding low saturated fat diet. Not consistent with exercise. No family history of premature CAD. Blood pressures been stable and no history of diabetes. He does take red rice yeast extract  Past Medical History  Diagnosis Date  . Hyperlipidemia   . Hypertension   . Back pain   . Fungal nail infection    Past Surgical History  Procedure Laterality Date  . Fungal nail    . Ankle surgery trauma    . Eye lid surgery      reports that he quit smoking about 34 years ago. He has never used smokeless tobacco. He reports that he drinks alcohol. He reports that he does not use illicit drugs. family history includes Alcohol abuse in his father; Arthritis in his sister; COPD in his sister; Cancer in his father; Heart disease in his mother; Liver disease in his father. No Known Allergies    Review of Systems  Constitutional: Negative for fatigue.  Eyes: Negative for visual disturbance.  Respiratory: Negative for cough, chest tightness and shortness of breath.   Cardiovascular: Negative for chest pain, palpitations and leg swelling.  Neurological: Negative for dizziness, syncope, weakness, light-headedness and headaches.       Objective:   Physical Exam  Constitutional: He appears well-developed and well-nourished.  Neck: Neck supple. No thyromegaly present.  Cardiovascular: Normal rate and regular rhythm.  Exam reveals no gallop.   No murmur heard. Pulmonary/Chest: Effort normal and breath sounds normal. No respiratory distress. He has no wheezes. He has no rales.    Musculoskeletal: He exhibits no edema.          Assessment & Plan:  Hyperlipidemia. Lipids were fairly well controlled last fall. We've recommended repeat fasting lipids around August of next year. Establish more consistent exercise. [facility]

## 2015-08-15 NOTE — Progress Notes (Signed)
Pre visit review using our clinic review tool, if applicable. No additional management support is needed unless otherwise documented below in the visit note. 

## 2015-10-23 DIAGNOSIS — C61 Malignant neoplasm of prostate: Secondary | ICD-10-CM | POA: Diagnosis not present

## 2015-10-29 DIAGNOSIS — Z0000 Encounter for general adult medical examination without abnormal findings: Secondary | ICD-10-CM | POA: Diagnosis not present

## 2015-10-29 DIAGNOSIS — E291 Testicular hypofunction: Secondary | ICD-10-CM | POA: Diagnosis not present

## 2015-10-29 DIAGNOSIS — C61 Malignant neoplasm of prostate: Secondary | ICD-10-CM | POA: Diagnosis not present

## 2015-10-29 DIAGNOSIS — N5201 Erectile dysfunction due to arterial insufficiency: Secondary | ICD-10-CM | POA: Diagnosis not present

## 2015-11-22 ENCOUNTER — Other Ambulatory Visit: Payer: Self-pay | Admitting: Family Medicine

## 2016-02-17 ENCOUNTER — Encounter: Payer: Self-pay | Admitting: Family Medicine

## 2016-02-17 ENCOUNTER — Ambulatory Visit (INDEPENDENT_AMBULATORY_CARE_PROVIDER_SITE_OTHER): Payer: Medicare Other | Admitting: Family Medicine

## 2016-02-17 VITALS — BP 120/80 | HR 80 | Temp 97.8°F | Ht 70.0 in | Wt 164.0 lb

## 2016-02-17 DIAGNOSIS — E785 Hyperlipidemia, unspecified: Secondary | ICD-10-CM

## 2016-02-17 LAB — HEPATIC FUNCTION PANEL
ALT: 30 U/L (ref 0–53)
AST: 23 U/L (ref 0–37)
Albumin: 4.4 g/dL (ref 3.5–5.2)
Alkaline Phosphatase: 67 U/L (ref 39–117)
BILIRUBIN DIRECT: 0.2 mg/dL (ref 0.0–0.3)
TOTAL PROTEIN: 6.6 g/dL (ref 6.0–8.3)
Total Bilirubin: 0.9 mg/dL (ref 0.2–1.2)

## 2016-02-17 LAB — LIPID PANEL
CHOL/HDL RATIO: 3
Cholesterol: 208 mg/dL — ABNORMAL HIGH (ref 0–200)
HDL: 65.4 mg/dL (ref >39.00)
LDL CALC: 118 mg/dL — AB (ref 0–99)
NONHDL: 142.25
TRIGLYCERIDES: 121 mg/dL (ref 0.0–149.0)
VLDL: 24.2 mg/dL (ref 0.0–40.0)

## 2016-02-17 NOTE — Progress Notes (Signed)
Subjective:     Patient ID: Ryan [name], male   DOB: 1946-03-24, 70 y.o.   MRN: [id number]  HPI Patient seen for follow-up regarding hyperlipidemia. Prior history of elevated blood pressure but currently stable off medications. He has hypothyroidism which is followed by [facility]. Past history of prostate cancer Requesting repeat lipid panel. Takes over-the-counter red yeast rice extract. No recent chest pains. Poor compliance with diet and his had some recent weight gain.  Past Medical History:  Diagnosis Date  . Back pain   . Fungal nail infection   . Hyperlipidemia   . Hypertension    Past Surgical History:  Procedure Laterality Date  . ankle surgery trauma    . eye lid surgery    . fungal nail      reports that he quit smoking about 34 years ago. He has never used smokeless tobacco. He reports that he drinks alcohol. He reports that he does not use drugs. family history includes Alcohol abuse in his father; Arthritis in his sister; COPD in his sister; Cancer in his father; Heart disease in his mother; Liver disease in his father. No Known Allergies   Review of Systems  Constitutional: Negative for appetite change, fatigue and unexpected weight change.  Eyes: Negative for visual disturbance.  Respiratory: Negative for cough, chest tightness and shortness of breath.   Cardiovascular: Negative for chest pain, palpitations and leg swelling.  Endocrine: Negative for polydipsia and polyuria.  Neurological: Negative for dizziness, syncope, weakness, light-headedness and headaches.       Objective:   Physical Exam  Constitutional: He appears well-developed and well-nourished.  Neck: Neck supple. No thyromegaly present.  Cardiovascular: Normal rate and regular rhythm.   Pulmonary/Chest: Effort normal and breath sounds normal. No respiratory distress. He has no wheezes. He has no rales.  Musculoskeletal: He exhibits no edema.  Lymphadenopathy:    He has no cervical  adenopathy.       Assessment:     Hyperlipidemia. Patient taking over-the-counter supplement as above. He has been reluctant to consider statin use.    Plan:     -Recheck fasting lipid and hepatic panel -Discussed low saturated fat diet and continue regular exercise habits -Routine follow-up 6 months and sooner as needed -Patient will [facility]  [name] MD [facility] at [facility]

## 2016-02-17 NOTE — Progress Notes (Signed)
Pre visit review using our clinic review tool, if applicable. No additional management support is needed unless otherwise documented below in the visit note. 

## 2016-03-31 DIAGNOSIS — C61 Malignant neoplasm of prostate: Secondary | ICD-10-CM | POA: Diagnosis not present

## 2016-04-05 DIAGNOSIS — C61 Malignant neoplasm of prostate: Secondary | ICD-10-CM | POA: Diagnosis not present

## 2016-04-05 DIAGNOSIS — N5201 Erectile dysfunction due to arterial insufficiency: Secondary | ICD-10-CM | POA: Diagnosis not present

## 2016-08-09 DIAGNOSIS — C61 Malignant neoplasm of prostate: Secondary | ICD-10-CM | POA: Diagnosis not present

## 2016-08-20 ENCOUNTER — Encounter: Payer: Self-pay | Admitting: Family Medicine

## 2016-08-20 ENCOUNTER — Ambulatory Visit (INDEPENDENT_AMBULATORY_CARE_PROVIDER_SITE_OTHER): Payer: Medicare Other | Admitting: Family Medicine

## 2016-08-20 VITALS — BP 138/78 | HR 81 | Ht 70.0 in | Wt 163.0 lb

## 2016-08-20 DIAGNOSIS — E785 Hyperlipidemia, unspecified: Secondary | ICD-10-CM

## 2016-08-20 DIAGNOSIS — I10 Essential (primary) hypertension: Secondary | ICD-10-CM

## 2016-08-20 DIAGNOSIS — E039 Hypothyroidism, unspecified: Secondary | ICD-10-CM | POA: Diagnosis not present

## 2016-08-20 DIAGNOSIS — N5201 Erectile dysfunction due to arterial insufficiency: Secondary | ICD-10-CM | POA: Diagnosis not present

## 2016-08-20 DIAGNOSIS — C61 Malignant neoplasm of prostate: Secondary | ICD-10-CM | POA: Diagnosis not present

## 2016-08-20 LAB — LIPID PANEL
Cholesterol: 194 mg/dL (ref 0–200)
HDL: 56 mg/dL (ref >39.00)
LDL Cholesterol: 116 mg/dL — ABNORMAL HIGH (ref 0–99)
NONHDL: 138.2
Total CHOL/HDL Ratio: 3
Triglycerides: 109 mg/dL (ref 0.0–149.0)
VLDL: 21.8 mg/dL (ref 0.0–40.0)

## 2016-08-20 LAB — HEPATIC FUNCTION PANEL
ALT: 20 U/L (ref 0–53)
AST: 17 U/L (ref 0–37)
Albumin: 4.5 g/dL (ref 3.5–5.2)
Alkaline Phosphatase: 70 U/L (ref 39–117)
BILIRUBIN TOTAL: 0.9 mg/dL (ref 0.2–1.2)
Bilirubin, Direct: 0.2 mg/dL (ref 0.0–0.3)
Total Protein: 6.7 g/dL (ref 6.0–8.3)

## 2016-08-20 LAB — TSH: TSH: 0.71 u[IU]/mL (ref 0.35–4.50)

## 2016-08-20 LAB — T3, FREE: T3, Free: 4.3 pg/mL — ABNORMAL HIGH (ref 2.3–4.2)

## 2016-08-20 LAB — T4, FREE: FREE T4: 1.02 ng/dL (ref 0.60–1.60)

## 2016-08-20 NOTE — Progress Notes (Signed)
Subjective:     Patient ID: Ryan [name], male   DOB: 1946/04/15, 71 y.o.   MRN: [id number]  HPI Patient here for follow-up regarding dyslipidemia and hypothyroidism. He has been getting his thyroid checked at the [place]. This was checked several months ago was low at 0.24. He is requesting repeat today. Takes very low dose of Armour Thyroid.  No overt symptoms of hyperthyroidism.  He has hyperlipidemia and takes herbal red yeast rice extract.  Requesting follow-up labs today. Not exercising much recently. No chest pains. No dizziness.  Past Medical History:  Diagnosis Date  . Back pain   . Fungal nail infection   . Hyperlipidemia   . Hypertension    Past Surgical History:  Procedure Laterality Date  . ankle surgery trauma    . eye lid surgery    . fungal nail      reports that he quit smoking about 35 years ago. He has never used smokeless tobacco. He reports that he drinks alcohol. He reports that he does not use drugs. family history includes Alcohol abuse in his father; Arthritis in his sister; COPD in his sister; Cancer in his father; Heart disease in his mother; Liver disease in his father. No Known Allergies   Review of Systems  Constitutional: Negative for fatigue.  Eyes: Negative for visual disturbance.  Respiratory: Negative for cough, chest tightness and shortness of breath.   Cardiovascular: Negative for chest pain, palpitations and leg swelling.  Neurological: Negative for dizziness, syncope, weakness, light-headedness and headaches.       Objective:   Physical Exam  Constitutional: He is oriented to person, place, and time. He appears well-developed and well-nourished.  HENT:  Right Ear: External ear normal.  Left Ear: External ear normal.  Mouth/Throat: Oropharynx is clear and moist.  Eyes: Pupils are equal, round, and reactive to light.  Neck: Neck supple. No thyromegaly present.  Cardiovascular: Normal rate and regular rhythm.   Pulmonary/Chest: Effort  normal and breath sounds normal. No respiratory distress. He has no wheezes. He has no rales.  Musculoskeletal: He exhibits no edema.  Neurological: He is alert and oriented to person, place, and time.       Assessment:     #1 hypothyroidism with recent mildly low TSH  #2 dyslipidemia    Plan:     -Check labs today with TSH, free T4, hepatic panel, and lipid panel -continue with low saturated fat diet.  [name] MD [facility] at [facility]

## 2016-08-20 NOTE — Progress Notes (Signed)
Pre visit review using our clinic review tool, if applicable. No additional management support is needed unless otherwise documented below in the visit note. 

## 2016-08-22 ENCOUNTER — Encounter: Payer: Self-pay | Admitting: Family Medicine

## 2016-12-08 ENCOUNTER — Other Ambulatory Visit: Payer: Self-pay | Admitting: Urology

## 2016-12-08 DIAGNOSIS — C61 Malignant neoplasm of prostate: Secondary | ICD-10-CM

## 2017-01-20 DIAGNOSIS — H2513 Age-related nuclear cataract, bilateral: Secondary | ICD-10-CM | POA: Diagnosis not present

## 2017-02-01 ENCOUNTER — Ambulatory Visit (HOSPITAL_COMMUNITY)
Admission: RE | Admit: 2017-02-01 | Discharge: 2017-02-01 | Disposition: A | Discharge status: Home / Self Care | Payer: Medicare Other | Source: Ambulatory Visit | Attending: Urology | Admitting: Urology

## 2017-02-01 DIAGNOSIS — C61 Malignant neoplasm of prostate: Secondary | ICD-10-CM | POA: Insufficient documentation

## 2017-02-01 LAB — POCT I-STAT CREATININE: Creatinine, Ser: 0.9 mg/dL (ref 0.61–1.24)

## 2017-02-01 MED ORDER — GADOBENATE DIMEGLUMINE 529 MG/ML IV SOLN
15.0000 mL | Freq: Once | INTRAVENOUS | Status: AC | PRN
  Administered 2017-02-01: 15 mL via INTRAVENOUS

## 2017-02-11 MED ORDER — GADOBENATE DIMEGLUMINE 529 MG/ML IV SOLN
15.0000 mL | Freq: Once | INTRAVENOUS | Status: AC | PRN
  Administered 2017-02-01: 15 mL via INTRAVENOUS

## 2017-02-16 ENCOUNTER — Ambulatory Visit (INDEPENDENT_AMBULATORY_CARE_PROVIDER_SITE_OTHER): Payer: Medicare Other | Admitting: Family Medicine

## 2017-02-16 ENCOUNTER — Encounter: Payer: Self-pay | Admitting: Family Medicine

## 2017-02-16 VITALS — BP 110/68 | HR 79 | Temp 98.1°F | Wt 164.2 lb

## 2017-02-16 DIAGNOSIS — C61 Malignant neoplasm of prostate: Secondary | ICD-10-CM | POA: Diagnosis not present

## 2017-02-16 DIAGNOSIS — S86812A Strain of other muscle(s) and tendon(s) at lower leg level, left leg, initial encounter: Secondary | ICD-10-CM | POA: Diagnosis not present

## 2017-02-16 DIAGNOSIS — E039 Hypothyroidism, unspecified: Secondary | ICD-10-CM | POA: Diagnosis not present

## 2017-02-16 NOTE — Progress Notes (Signed)
Subjective:     Patient ID: Ryan [name], male   DOB: 05/23/1946, 71 y.o.   MRN: [id number]  HPI Patient here to discuss the following issues  Yesterday he was pushing a mower and felt a "pop" sensation left posterior calf. He noted some pain with ambulation afterwards. No visible bruising. Still sore today. No Achilles pain. He did apply some eye yesterday.  Hypothyroidism. He had labs to the [place] [facility] back in June with normal TSH. His lipids were excellent.  Prostate cancer proven by biopsy in the past. He had recent MRI scan which showed no evidence for visible cancer. He has pending biopsy next month.  Past Medical History:  Diagnosis Date  . Back pain   . Fungal nail infection   . Hyperlipidemia   . Hypertension    Past Surgical History:  Procedure Laterality Date  . ankle surgery trauma    . eye lid surgery    . fungal nail      reports that he quit smoking about 35 years ago. He has never used smokeless tobacco. He reports that he drinks alcohol. He reports that he does not use drugs. family history includes Alcohol abuse in his father; Arthritis in his sister; COPD in his sister; Cancer in his father; Heart disease in his mother; Liver disease in his father. No Known Allergies   Review of Systems  Constitutional: Negative for fatigue.  Eyes: Negative for visual disturbance.  Respiratory: Negative for cough, chest tightness and shortness of breath.   Cardiovascular: Negative for chest pain, palpitations and leg swelling.  Endocrine: Negative for polydipsia and polyuria.  Neurological: Negative for dizziness, syncope, weakness, light-headedness and headaches.       Objective:   Physical Exam  Constitutional: He appears well-developed and well-nourished.  Cardiovascular: Normal rate and regular rhythm.   Pulmonary/Chest: Effort normal and breath sounds normal. No respiratory distress. He has no wheezes. He has no rales.  Musculoskeletal: He exhibits no  edema.  Left calf slightly tender to palpation proximally along the lateral head of gastrocnemius. No visible bruising. No erythema.       Assessment:     #1 left gastrocnemius muscle strain  #2 hypothyroidism  #3 history of prostate cancer with no evidence for visible abnormality on recent MRI    Plan:     -Recent labs reviewed through [place] and none necessary day -Recommend icing the last couple days to left calf followed by heat and gentle stretches -Continue close follow-up with urology regarding his elevated PSA  [name] MD [facility] at [facility]

## 2017-02-23 DIAGNOSIS — C61 Malignant neoplasm of prostate: Secondary | ICD-10-CM | POA: Diagnosis not present

## 2017-03-24 ENCOUNTER — Encounter: Payer: Self-pay | Admitting: Family Medicine

## 2017-05-09 ENCOUNTER — Encounter: Payer: Self-pay | Admitting: Gastroenterology

## 2017-08-15 DIAGNOSIS — C61 Malignant neoplasm of prostate: Secondary | ICD-10-CM | POA: Diagnosis not present

## 2017-08-19 DIAGNOSIS — C61 Malignant neoplasm of prostate: Secondary | ICD-10-CM | POA: Diagnosis not present

## 2017-08-19 DIAGNOSIS — N5201 Erectile dysfunction due to arterial insufficiency: Secondary | ICD-10-CM | POA: Diagnosis not present

## 2017-08-29 ENCOUNTER — Ambulatory Visit (INDEPENDENT_AMBULATORY_CARE_PROVIDER_SITE_OTHER): Payer: Medicare Other | Admitting: Family Medicine

## 2017-08-29 ENCOUNTER — Encounter: Payer: Self-pay | Admitting: Family Medicine

## 2017-08-29 VITALS — BP 110/64 | HR 77 | Temp 98.1°F | Wt 164.8 lb

## 2017-08-29 DIAGNOSIS — B001 Herpesviral vesicular dermatitis: Secondary | ICD-10-CM

## 2017-08-29 MED ORDER — ACYCLOVIR 400 MG PO TABS: 400.0000 mg | ORAL_TABLET | Freq: Every day | ORAL | 3 refills | Status: DC

## 2017-08-29 NOTE — Progress Notes (Signed)
Subjective:     Patient ID: Ryan [name], male   DOB: Apr 12, 1946, 72 y.o.   MRN: [id number]  HPI Patient medical follow-up. He has history of prostate cancer and apparently his recent PSAs have been declining. He is followed every 6 months by urology.  Has hypothyroidism and had lab through [place] back in June which were stable.    History of recurrent cold sores. Requesting refills of acyclovir which he uses as needed  Past Medical History:  Diagnosis Date  . Back pain   . Fungal nail infection   . Hyperlipidemia   . Hypertension    Past Surgical History:  Procedure Laterality Date  . ankle surgery trauma    . eye lid surgery    . fungal nail      reports that he quit smoking about 36 years ago. he has never used smokeless tobacco. He reports that he drinks alcohol. He reports that he does not use drugs. family history includes Alcohol abuse in his father; Arthritis in his sister; COPD in his sister; Cancer in his father; Heart disease in his mother; Liver disease in his father. No Known Allergies   Review of Systems  Constitutional: Negative for fatigue.  Eyes: Negative for visual disturbance.  Respiratory: Negative for cough, chest tightness and shortness of breath.   Cardiovascular: Negative for chest pain, palpitations and leg swelling.  Neurological: Negative for dizziness, syncope, weakness, light-headedness and headaches.       Objective:   Physical Exam  Constitutional: He is oriented to person, place, and time. He appears well-developed and well-nourished.  HENT:  Right Ear: External ear normal.  Left Ear: External ear normal.  Mouth/Throat: Oropharynx is clear and moist.  Eyes: Pupils are equal, round, and reactive to light.  Neck: Neck supple. No thyromegaly present.  Cardiovascular: Normal rate and regular rhythm.  Pulmonary/Chest: Effort normal and breath sounds normal. No respiratory distress. He has no wheezes. He has no rales.  Musculoskeletal: He  exhibits no edema.  Neurological: He is alert and oriented to person, place, and time.       Assessment:     Recurrent cold sores    Plan:     Refill acyclovir for as needed use Recommend set up Medicare wellness visit  [name] MD [facility] at [facility]

## 2017-08-29 NOTE — Patient Instructions (Signed)
Set up Medicare Wellness Visit.

## 2017-12-20 ENCOUNTER — Encounter: Payer: Self-pay | Admitting: Family Medicine

## 2017-12-26 ENCOUNTER — Encounter: Payer: Self-pay | Admitting: Family Medicine

## 2018-01-26 DIAGNOSIS — H2513 Age-related nuclear cataract, bilateral: Secondary | ICD-10-CM | POA: Diagnosis not present

## 2018-01-26 DIAGNOSIS — H11002 Unspecified pterygium of left eye: Secondary | ICD-10-CM | POA: Diagnosis not present

## 2018-02-14 DIAGNOSIS — C61 Malignant neoplasm of prostate: Secondary | ICD-10-CM | POA: Diagnosis not present

## 2018-02-22 DIAGNOSIS — N5201 Erectile dysfunction due to arterial insufficiency: Secondary | ICD-10-CM | POA: Diagnosis not present

## 2018-02-22 DIAGNOSIS — C61 Malignant neoplasm of prostate: Secondary | ICD-10-CM | POA: Diagnosis not present

## 2018-03-01 ENCOUNTER — Encounter: Payer: Self-pay | Admitting: Family Medicine

## 2018-03-01 ENCOUNTER — Ambulatory Visit (INDEPENDENT_AMBULATORY_CARE_PROVIDER_SITE_OTHER): Payer: Medicare Other | Admitting: Family Medicine

## 2018-03-01 VITALS — BP 116/68 | HR 75 | Temp 98.4°F | Wt 161.1 lb

## 2018-03-01 DIAGNOSIS — E039 Hypothyroidism, unspecified: Secondary | ICD-10-CM | POA: Diagnosis not present

## 2018-03-01 DIAGNOSIS — C61 Malignant neoplasm of prostate: Secondary | ICD-10-CM | POA: Diagnosis not present

## 2018-03-01 DIAGNOSIS — E785 Hyperlipidemia, unspecified: Secondary | ICD-10-CM

## 2018-03-01 LAB — HEPATIC FUNCTION PANEL
ALT: 25 U/L (ref 0–53)
AST: 23 U/L (ref 0–37)
Albumin: 4.6 g/dL (ref 3.5–5.2)
Alkaline Phosphatase: 72 U/L (ref 39–117)
BILIRUBIN DIRECT: 0.2 mg/dL (ref 0.0–0.3)
BILIRUBIN TOTAL: 1 mg/dL (ref 0.2–1.2)
Total Protein: 6.9 g/dL (ref 6.0–8.3)

## 2018-03-01 LAB — LIPID PANEL
Cholesterol: 198 mg/dL (ref 0–200)
HDL: 65.5 mg/dL (ref >39.00)
LDL CALC: 109 mg/dL — AB (ref 0–99)
NONHDL: 132.07
Total CHOL/HDL Ratio: 3
Triglycerides: 116 mg/dL (ref 0.0–149.0)
VLDL: 23.2 mg/dL (ref 0.0–40.0)

## 2018-03-01 NOTE — Progress Notes (Signed)
  Subjective:     Patient ID: Ryan [name], male   DOB: September 14, 1945, 72 y.o.   MRN: [id number]  HPI Patient here for medical follow-up. His chronic problems include history of borderline elevated blood pressure, hypothyroidism, prostate cancer, and mild hyperlipidemia. He is followed by urology. His last PSA reportedly 2.47. He's been followed observantly at this point with no active treatment. No obstructive urinary symptoms.  Hypothyroidism on replacement with Armour thyroid. He had recent labs to the [place] with normal TSH. Compliant with therapy.  History of mild hyperlipidemia. He is requesting repeat lipids today. Previous HDL 56. Tries to follow low saturated fat diet. No past history of CAD or peripheral vascular disease.  Brings in copy of recent [place] labs and he had some allergy testing significant for grass allergies, normal CBC, and normal electrolytes  Past Medical History:  Diagnosis Date  . Back pain   . Fungal nail infection   . Hyperlipidemia   . Hypertension    Past Surgical History:  Procedure Laterality Date  . ankle surgery trauma    . eye lid surgery    . fungal nail      reports that he quit smoking about 36 years ago. He has never used smokeless tobacco. He reports that he drinks alcohol. He reports that he does not use drugs. family history includes Alcohol abuse in his father; Arthritis in his sister; COPD in his sister; Cancer in his father; Heart disease in his mother; Liver disease in his father. Allergies  Allergen Reactions  . Dust Mite Extract   . Grass Extracts [Gramineae Pollens]      Review of Systems  Constitutional: Negative for appetite change, fatigue and unexpected weight change.  Eyes: Negative for visual disturbance.  Respiratory: Negative for cough, chest tightness and shortness of breath.   Cardiovascular: Negative for chest pain, palpitations and leg swelling.  Genitourinary: Negative for difficulty urinating and dysuria.   Neurological: Negative for dizziness, syncope, weakness, light-headedness and headaches.       Objective:   Physical Exam  Constitutional: He is oriented to person, place, and time. He appears well-developed and well-nourished.  HENT:  Right Ear: External ear normal.  Left Ear: External ear normal.  Mouth/Throat: Oropharynx is clear and moist.  Eyes: Pupils are equal, round, and reactive to light.  Neck: Neck supple. No thyromegaly present.  Cardiovascular: Normal rate and regular rhythm.  Pulmonary/Chest: Effort normal and breath sounds normal. No respiratory distress. He has no wheezes. He has no rales.  Musculoskeletal: He exhibits no edema.  Neurological: He is alert and oriented to person, place, and time.       Assessment:     #1 history of mild hyperlipidemia  #2 history of borderline elevated blood pressure. Initial blood pressure today 140/80 and repeat after rest significantly improved  #3 hypothyroidism on replacement with Armour Thyroid with recent TSH at goal  #4 prostate cancer followed by urology    Plan:     -repeat fasting lipid and hepatic panel -Continue low saturated fat diet -Continue current dose of Armour Thyroid -continue to monitor BP and be in touch if consistently > 140/90.  [name] MD [facility] at [facility]

## 2018-03-02 ENCOUNTER — Encounter: Payer: Self-pay | Admitting: Family Medicine

## 2018-03-08 DIAGNOSIS — H01025 Squamous blepharitis left lower eyelid: Secondary | ICD-10-CM | POA: Diagnosis not present

## 2018-03-08 DIAGNOSIS — H2513 Age-related nuclear cataract, bilateral: Secondary | ICD-10-CM | POA: Diagnosis not present

## 2018-03-08 DIAGNOSIS — H01021 Squamous blepharitis right upper eyelid: Secondary | ICD-10-CM | POA: Diagnosis not present

## 2018-03-08 DIAGNOSIS — H11002 Unspecified pterygium of left eye: Secondary | ICD-10-CM | POA: Diagnosis not present

## 2018-03-08 DIAGNOSIS — H2589 Other age-related cataract: Secondary | ICD-10-CM | POA: Diagnosis not present

## 2018-03-08 DIAGNOSIS — H01022 Squamous blepharitis right lower eyelid: Secondary | ICD-10-CM | POA: Diagnosis not present

## 2018-03-08 DIAGNOSIS — H01024 Squamous blepharitis left upper eyelid: Secondary | ICD-10-CM | POA: Diagnosis not present

## 2018-04-21 IMAGING — MR MR PROSTATE WO/W CM
23 of 54 series · 23 of 54 positions shown · IV contrast (multihance)
Comparison: Prostate MRI [DATE]

CLINICAL DATA: The above or prostate cancer (biopsy-proven Gleason
3 + 3 equals 6 at the LEFT and RIGHT base and mid gland. Elevated
PSA.

EXAM:
MR PROSTATE WITHOUT AND WITH CONTRAST
TECHNIQUE: Multiplanar multisequence MRI images were obtained of the pelvis
centered about the prostate. Pre and post contrast images were
obtained.
CONTRAST:  15 mL MultiHance all

[Series 3: bSSFP fat-sat · axial · 6.0mm · 0.86mm/px · 1 of 44 slices shown]
[im 1/44]
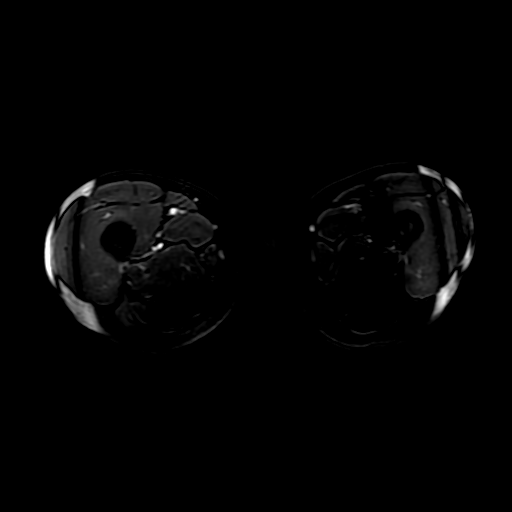

[Series 4: T1 · axial · 6.0mm · 0.86mm/px · 1 of 44 slices shown (1 of 2)]
[im 1/44]
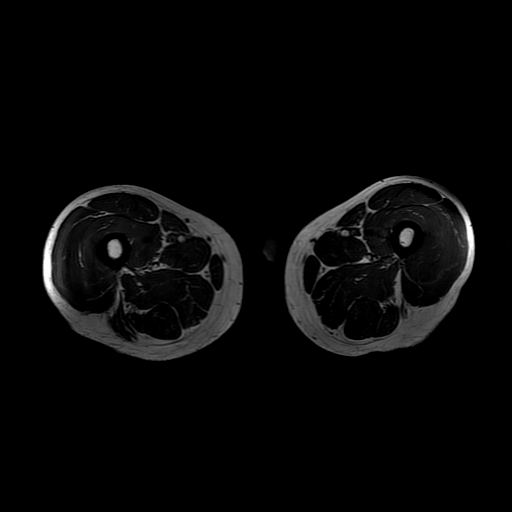

[Series 5: T2 · axial · 3.0mm · 0.29mm/px · 1 of 25 slices shown (1 of 4)]
[im 1/25]
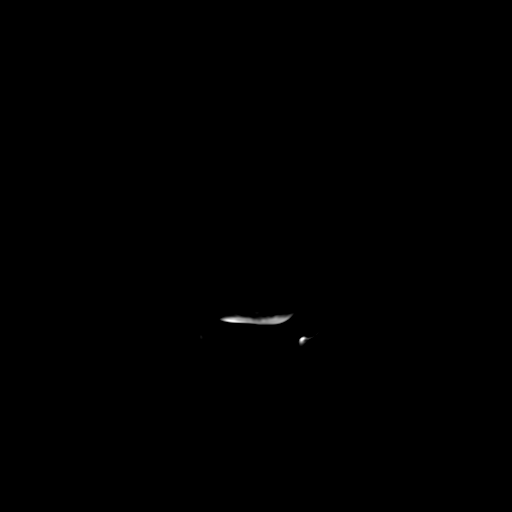

[Series 6: T1 · axial · 3.0mm · 0.29mm/px · 1 of 25 slices shown (2 of 2)]
[im 1/25]
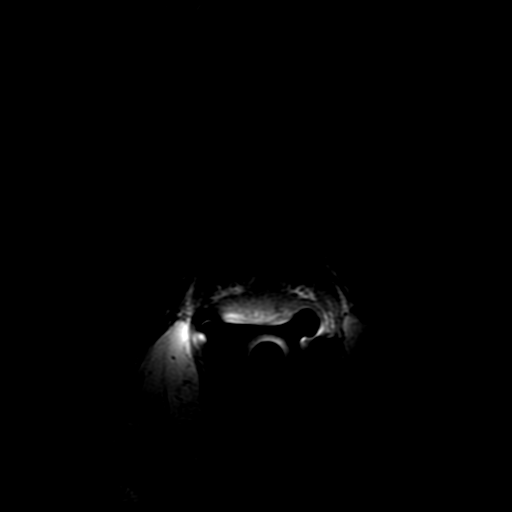

[Series 7: T2 · axial · 1.8mm · 0.47mm/px · 1 of 156 slices shown (2 of 4)]
[im 1/156]
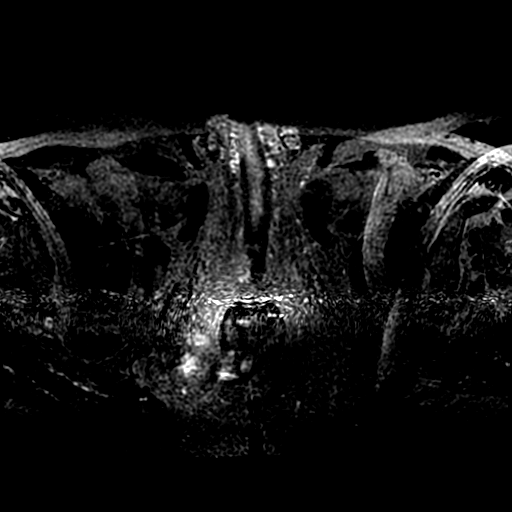

[Series 8: T2 · sagittal · 4.0mm · 0.29mm/px · 1 of 25 slices shown (3 of 4)]
[im 1/25]
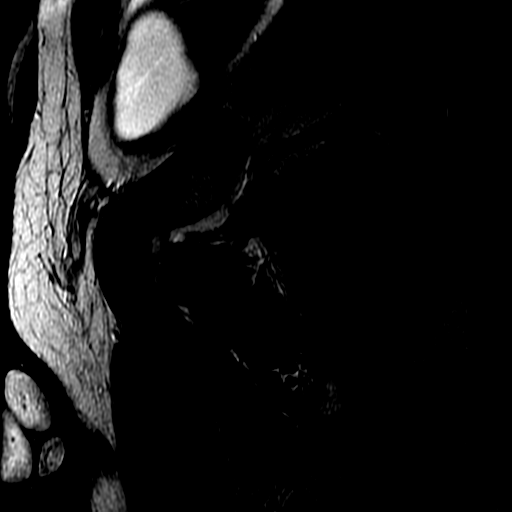

[Series 9: T2 · coronal · 4.0mm · 0.29mm/px · 1 of 25 slices shown (4 of 4)]
[im 1/25]
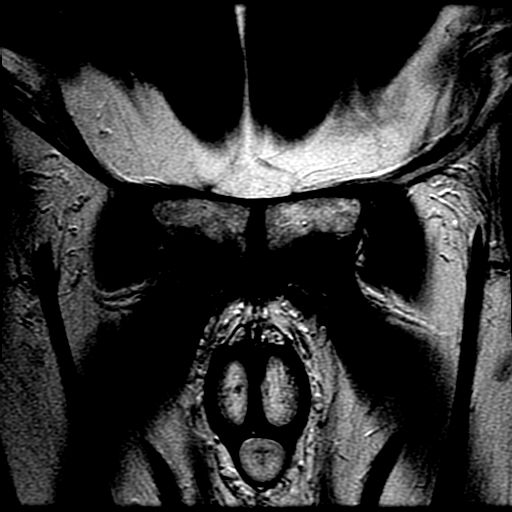

[Series 10: DWI · axial · 3.0mm · 0.59mm/px · 1 of 48 slices shown (1 of 6)]
[im 1/48]
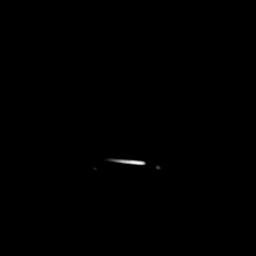

[Series 11: DWI · axial · 3.0mm · 0.59mm/px · 1 of 47 slices shown (2 of 6)]
[im 1/47]
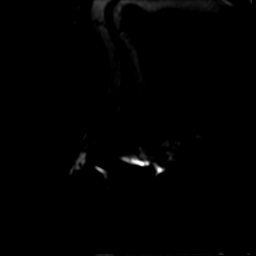

[Series 12: DWI · axial · 3.0mm · 0.59mm/px · 1 of 47 slices shown (3 of 6)]
[im 1/47]
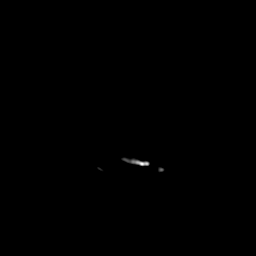

[Series 1000: DWI · axial · 3.0mm · 0.59mm/px · 1 of 24 slices shown (4 of 6)]
[im 1/24]
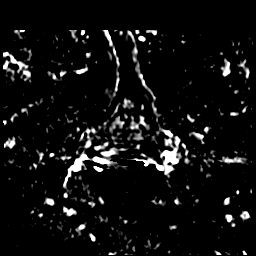

[Series 1100: DWI · axial · 3.0mm · 0.59mm/px · 1 of 24 slices shown (5 of 6)]
[im 1/24]
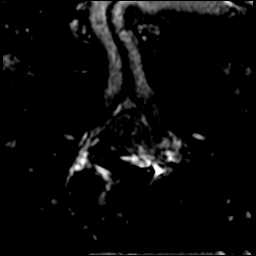

[Series 1200: DWI · axial · 3.0mm · 0.59mm/px · 1 of 24 slices shown (6 of 6)]
[im 1/24]
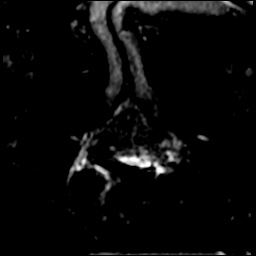

[((id)/(id)/1)-((id)/(id)/1) · axial · 3.0mm · 0.43mm/px · 1 of 75 slices shown (1 of 10)]
[im 1/75]
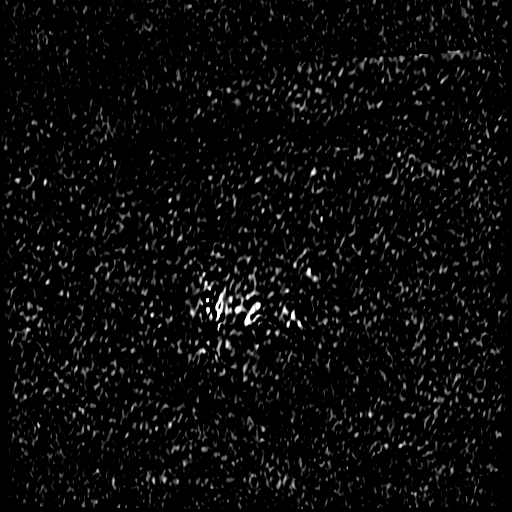

[((id)/(id)/1)-((id)/(id)/1) · axial · 3.0mm · 0.43mm/px · 1 of 76 slices shown (2 of 10)]
[im 1/76]
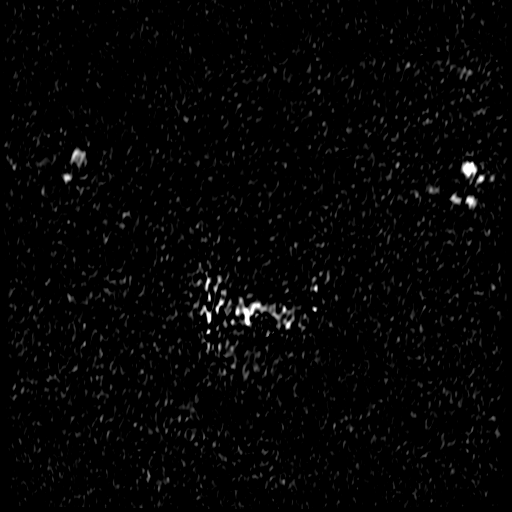

[((id)/(id)/1)-((id)/(id)/1) · axial · 3.0mm · 0.43mm/px · 1 of 73 slices shown (3 of 10)]
[im 1/73]
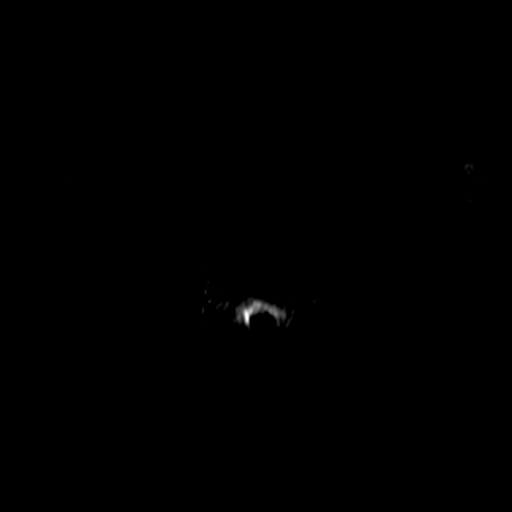

[((id)/(id)/1)-((id)/(id)/1) · axial · 3.0mm · 0.43mm/px · 1 of 75 slices shown (4 of 10)]
[im 1/75]
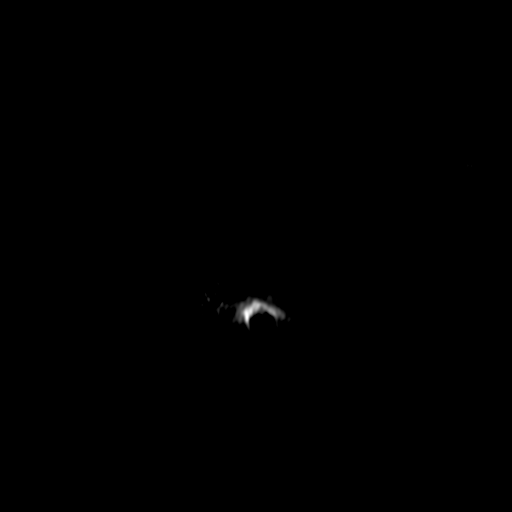

[((id)/(id)/1)-((id)/(id)/1) · axial · 3.0mm · 0.43mm/px · 1 of 76 slices shown (5 of 10)]
[im 1/76]
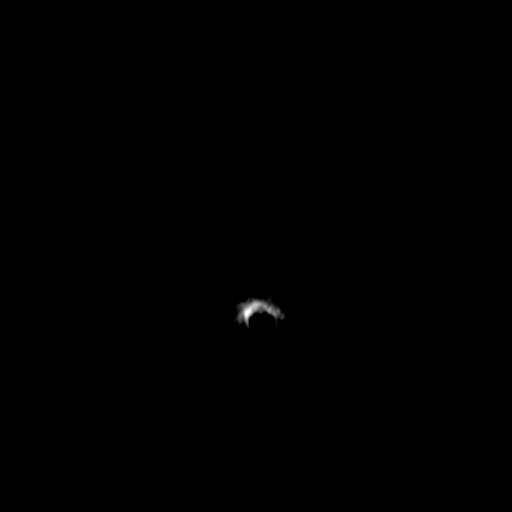

[((id)/(id)/1)-((id)/(id)/1) · axial · 3.0mm · 0.43mm/px · 1 of 76 slices shown (6 of 10)]
[im 1/76]
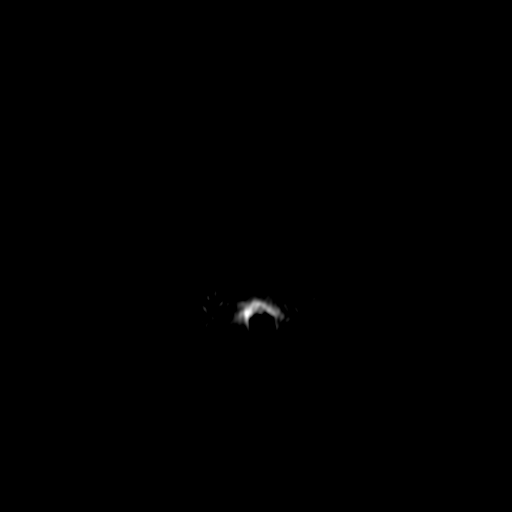

[((id)/(id)/1)-((id)/(id)/1) · axial · 3.0mm · 0.43mm/px · 1 of 76 slices shown (7 of 10)]
[im 1/76]
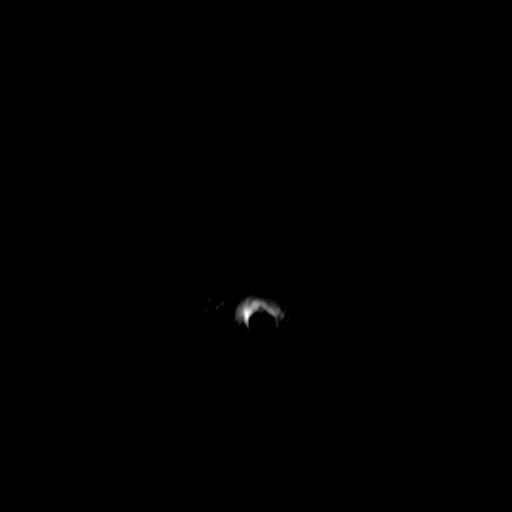

[((id)/(id)/1)-((id)/(id)/1) · axial · 3.0mm · 0.43mm/px · 1 of 76 slices shown (8 of 10)]
[im 1/76]
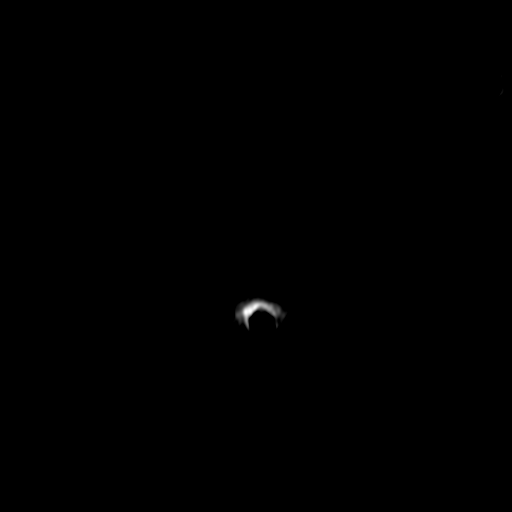

[((id)/(id)/1)-((id)/(id)/1) · axial · 3.0mm · 0.43mm/px · 1 of 76 slices shown (9 of 10)]
[im 1/76]
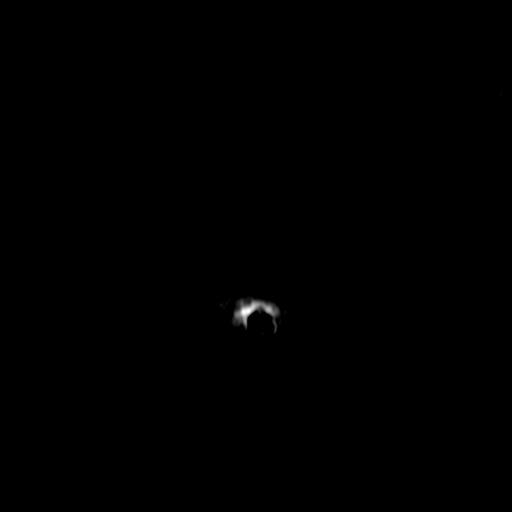

[((id)/(id)/1)-((id)/(id)/1) · axial · 3.0mm · 0.43mm/px · 1 of 76 slices shown (10 of 10)]
[im 1/76]
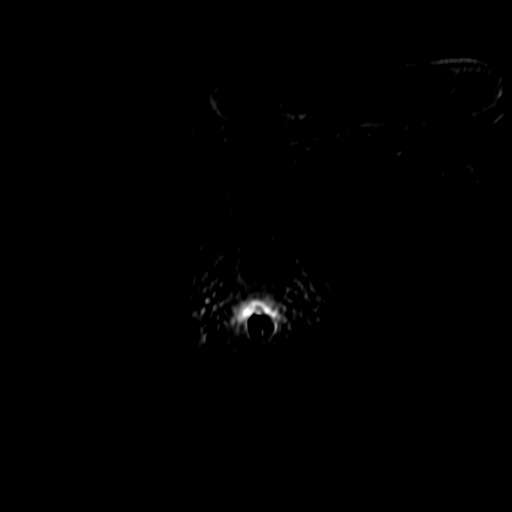

[23 of 54 positions shown; findings below may reference images not displayed]

FINDINGS: Prostate: There is relatively uniform high signal intensity
throughout the peripheral zone on T2 weighted imaging. There is loss
signal intensity at the LEFT and RIGHT base midline which is
symmetric and and favored fibrous stromal tissue. No clear foci of
restricted diffusion within the peripheral zone.

The transitional zone is nodular with well capsulated heterogeneous
benign-appearing prostate nodules.

The prostatic capsule is intact. The neurovascular bundles normal
consistent vesicles are normal

Volume: 4.9 x 3.4 x  3.6 cm (volume = 31 cm^3)

Transcapsular spread:  Absent

Seminal vesicle involvement: Absent

Neurovascular bundle involvement: Absent

Pelvic adenopathy: Absent

Bone metastasis: Absent

Other findings: None
IMPRESSION: 1. No evidence of high-grade carcinoma within the peripheral zone.
2. Nodular transitional zone.
3. Prostatic capsule intact.

## 2018-05-08 ENCOUNTER — Encounter: Payer: Self-pay | Admitting: Family Medicine

## 2018-05-08 ENCOUNTER — Ambulatory Visit (INDEPENDENT_AMBULATORY_CARE_PROVIDER_SITE_OTHER): Payer: Medicare Other

## 2018-05-08 ENCOUNTER — Ambulatory Visit (INDEPENDENT_AMBULATORY_CARE_PROVIDER_SITE_OTHER): Payer: Medicare Other | Admitting: Family Medicine

## 2018-05-08 VITALS — BP 140/80 | HR 88 | Temp 98.3°F | Resp 12 | Ht 68.19 in | Wt 158.4 lb

## 2018-05-08 DIAGNOSIS — R0602 Shortness of breath: Secondary | ICD-10-CM | POA: Diagnosis not present

## 2018-05-08 DIAGNOSIS — R059 Cough, unspecified: Secondary | ICD-10-CM

## 2018-05-08 DIAGNOSIS — R05 Cough: Secondary | ICD-10-CM | POA: Diagnosis not present

## 2018-05-08 DIAGNOSIS — J189 Pneumonia, unspecified organism: Secondary | ICD-10-CM

## 2018-05-08 DIAGNOSIS — J181 Lobar pneumonia, unspecified organism: Secondary | ICD-10-CM

## 2018-05-08 MED ORDER — BENZONATATE 100 MG PO CAPS: 200.0000 mg | ORAL_CAPSULE | Freq: Two times a day (BID) | ORAL | 0 refills | Status: AC | PRN

## 2018-05-08 MED ORDER — HYDROCODONE-HOMATROPINE 5-1.5 MG/5ML PO SYRP
5.0000 mL | ORAL_SOLUTION | Freq: Every evening | ORAL | 0 refills | Status: AC | PRN
Start: 2018-05-08 — End: 2018-05-18

## 2018-05-08 MED ORDER — DOXYCYCLINE HYCLATE 100 MG PO TABS: 100.0000 mg | ORAL_TABLET | Freq: Two times a day (BID) | ORAL | 0 refills | Status: AC

## 2018-05-08 NOTE — Patient Instructions (Signed)
  [name] I have seen you today for an acute visit.  A few things to remember from today's visit:   Cough - Plan: DG Chest 2 View, HYDROcodone-homatropine (HYCODAN) 5-1.5 MG/5ML syrup, benzonatate (TESSALON) 100 MG capsule  URI, acute   If medications prescribed today, they will not be refill upon request, a follow up appointment with PCP will be necessary to discuss continuation of of treatment if appropriate.  viral infections are self-limited and we treat each symptom depending of severity.  Over the counter medications as decongestants and cold medications usually help, they need to be taken with caution if there is a history of high blood pressure or palpitations. Tylenol and/or Ibuprofen also helps with most symptoms (headache, muscle aching, fever,etc) Plenty of fluids. Honey helps with cough. Steam inhalations helps with runny nose, nasal congestion, and may prevent sinus infections. Cough and nasal congestion could last a few days and sometimes weeks.     In general please monitor for signs of worsening symptoms and seek immediate medical attention if any concerning.  If symptoms are not resolved in 1-2 weeks you should schedule a follow up appointment with your doctor, before if needed.  I hope you get better soon!

## 2018-05-08 NOTE — Progress Notes (Signed)
ACUTE VISIT  HPI:  Chief Complaint  Patient presents with  . Cough    green sputum started Friday  . Chest congestion    started Wednesday    [name] is a 72 y.o.male here today with his wife complaining of 5 days of respiratory symptoms. Fever, max 101.0  F. He had fever last late Friday night early Saturday. Negative for chest pain or wheezing. He does not feel like short of breath but his wife has noted some "labored breathing" at night.  No Hx of asthma or COPD.   URI   This is a new problem. The current episode started in the past 7 days. The problem has been unchanged. The maximum temperature recorded prior to his arrival was 101 - 101.9 F. The fever has been present for 3 to 4 days. Associated symptoms include coughing. Pertinent negatives include no abdominal pain, chest pain, congestion, diarrhea, ear pain, headaches, nausea, neck pain, plugged ear sensation, rash, rhinorrhea, sinus pain, sore throat, swollen glands, vomiting or wheezing. He has tried decongestant for the symptoms. The treatment provided mild relief.    No Hx of recent travel. No sick contact. No known insect bite.  + Former smoker. OTC medications for this problem: Alka-Seltzer cold and NyQuil.    Review of Systems  Constitutional: Positive for activity change, appetite change, chills, fatigue and fever.  HENT: Negative for congestion, ear pain, mouth sores, postnasal drip, rhinorrhea, sinus pain, sore throat, trouble swallowing and voice change.   Eyes: Negative for discharge and redness.  Respiratory: Positive for cough and shortness of breath. Negative for chest tightness and wheezing.   Cardiovascular: Negative for chest pain.  Gastrointestinal: Negative for abdominal pain, diarrhea, nausea and vomiting.  Musculoskeletal: Positive for myalgias. Negative for gait problem and neck pain.  Skin: Negative for rash.  Allergic/Immunologic: Positive for environmental  allergies.  Neurological: Negative for weakness and headaches.      Current Outpatient Medications on File Prior to Visit  Medication Sig Dispense Refill  . acyclovir (ZOVIRAX) 400 MG tablet Take 1 tablet (400 mg total) by mouth daily. 90 tablet 3  . ARMOUR THYROID 60 MG tablet TAKE 1 TABLET DAILY 90 tablet 1  . Ascorbic Acid (VITAMIN C) 1000 MG tablet Take 1,000 mg by mouth daily.      . cetirizine (ZYRTEC) 10 MG tablet Take 10 mg by mouth daily.    . Cholecalciferol (VITAMIN D) 1000 UNITS capsule Take 1,000 Units by mouth daily.      Marland Kitchen co-enzyme Q-10 30 MG capsule Take 100 mg by mouth 2 (two) times daily.     . Cyanocobalamin (VITAMIN B-12 CR) 1500 MCG TBCR Take by mouth.      . Digestive Enzymes (DIGESTIVE ENZYME PO) Take 1 tablet by mouth 3 (three) times daily after meals.    Marland Kitchen loratadine (CLARITIN) 10 MG tablet Take 10 mg by mouth daily.    . Magnesium Citrate 100 MG TABS Take 400 mg by mouth.    . Milk Thistle 150 MG CAPS Take by mouth. 375mg  per day     . Red Yeast Rice 600 MG TABS Take 4 tablets by mouth at bedtime.     . vitamin E 400 UNIT capsule Take 400 Units by mouth daily.      . Zinc 50 MG CAPS Take by mouth.       No current facility-administered medications on file prior to visit.  Past Medical History:  Diagnosis Date  . Back pain   . Fungal nail infection   . Hyperlipidemia   . Hypertension    Allergies  Allergen Reactions  . Dust Mite Extract   . Grass Extracts [Gramineae Pollens]     Social History   Socioeconomic History  . Marital status: Married    Spouse name: Not on file  . Number of children: Not on file  . Years of education: Not on file  . Highest education level: Not on file  Occupational History  . Not on file  Social Needs  . Financial resource strain: Not on file  . Food insecurity:    Worry: Not on file    Inability: Not on file  . Transportation needs:    Medical: Not on file    Non-medical: Not on file  Tobacco Use  .  Smoking status: Former Smoker    Last attempt to quit: 07/05/1981    Years since quitting: 36.8  . Smokeless tobacco: Never Used  Substance and Sexual Activity  . Alcohol use: Yes  . Drug use: No  . Sexual activity: Not on file  Lifestyle  . Physical activity:    Days per week: Not on file    Minutes per session: Not on file  . Stress: Not on file  Relationships  . Social connections:    Talks on phone: Not on file    Gets together: Not on file    Attends religious service: Not on file    Active member of club or organization: Not on file    Attends meetings of clubs or organizations: Not on file    Relationship status: Not on file  Other Topics Concern  . Not on file  Social History Narrative  . Not on file    Vitals:   05/08/18 1148  BP: 140/80  Pulse: 88  Resp: 12  Temp: 98.3 F (36.8 C)  SpO2: 97%   Body mass index is 23.95 kg/m.    Physical Exam  Nursing note and vitals reviewed. Constitutional: He is oriented to person, place, and time. He appears well-developed. He does not appear ill. No distress.  HENT:  Head: Normocephalic and atraumatic.  Right Ear: Tympanic membrane, external ear and ear canal normal.  Left Ear: Tympanic membrane, external ear and ear canal normal.  Nose: Rhinorrhea present. Right sinus exhibits no maxillary sinus tenderness and no frontal sinus tenderness. Left sinus exhibits no maxillary sinus tenderness and no frontal sinus tenderness.  Mouth/Throat: Oropharynx is clear and moist and mucous membranes are normal.  Hyperemic nasal mucosa. Postnasal drainage. Ear aids.  Eyes: Conjunctivae are normal.  Cardiovascular: Normal rate and regular rhythm.  No murmur heard. Respiratory: Effort normal and breath sounds normal. No stridor. No respiratory distress. He has no rales.  Prolonged expiration and transmitted noises from upper airway.   Lymphadenopathy:       Head (right side): No submandibular adenopathy present.       Head (left  side): No submandibular adenopathy present.    He has no cervical adenopathy.  Neurological: He is alert and oriented to person, place, and time. He has normal strength. Gait normal.  Skin: Skin is warm. No rash noted. No erythema.  Psychiatric: He has a normal mood and affect.  Well groomed, good eye contact.      ASSESSMENT AND PLAN:   Mr. Jamarquis was seen today for cough and chest congestion.  Diagnoses and all orders for this  visit:  Shortness of breath  He is not in respiratory distress, I dodnot appreciate rales. Instructed about warning signs. Further recommendations according to imaging results.  -     DG Chest 2 View; Future  Cough  Viral respiratory infection most likely. Side effects of Hydrocodone discussed. Adequate hydration. Fever has resolved. Explained that cough can last a few days and even weeks.  -     DG Chest 2 View; Future -     HYDROcodone-homatropine (HYCODAN) 5-1.5 MG/5ML syrup; Take 5 mLs by mouth at bedtime as needed for up to 10 days for cough. -     benzonatate (TESSALON) 100 MG capsule; Take 2 capsules (200 mg total) by mouth 2 (two) times daily as needed for up to 10 days.  Pneumonia of right upper lobe due to infectious organism ([place])  CXR with right upper lung infiltrates. Abx treatment started. CXR to be related in 3-4 weeks to ensure resolution of infiltrates.  -     doxycycline (VIBRA-TABS) 100 MG tablet; Take 1 tablet (100 mg total) by mouth 2 (two) times daily for 7 days.       Isaia Hassell G. [place], MD  [facility]. [place] office.

## 2018-05-29 ENCOUNTER — Ambulatory Visit (INDEPENDENT_AMBULATORY_CARE_PROVIDER_SITE_OTHER): Payer: Medicare Other | Admitting: Family Medicine

## 2018-05-29 ENCOUNTER — Encounter: Payer: Self-pay | Admitting: Family Medicine

## 2018-05-29 ENCOUNTER — Ambulatory Visit (INDEPENDENT_AMBULATORY_CARE_PROVIDER_SITE_OTHER): Payer: Medicare Other

## 2018-05-29 VITALS — BP 120/78 | HR 80 | Temp 98.1°F | Wt 157.3 lb

## 2018-05-29 DIAGNOSIS — Z23 Encounter for immunization: Secondary | ICD-10-CM | POA: Diagnosis not present

## 2018-05-29 DIAGNOSIS — J181 Lobar pneumonia, unspecified organism: Secondary | ICD-10-CM

## 2018-05-29 DIAGNOSIS — R05 Cough: Secondary | ICD-10-CM | POA: Diagnosis not present

## 2018-05-29 DIAGNOSIS — J189 Pneumonia, unspecified organism: Secondary | ICD-10-CM

## 2018-05-29 NOTE — Progress Notes (Signed)
  Subjective:     Patient ID: Ryan [name], male   DOB: [birth year], 72 y.o.   MRN: [id number]  HPI Here for follow-up regarding right upper lobe pneumonia.  This was about 3 weeks ago.  He was treated with doxycycline.  He has only slight cough now but no fever.  No hemoptysis.  No pleuritic pain.  Recommendation was for follow-up chest x-ray in 3 to 4 weeks.  No recent appetite or weight changes.  He quit smoking 1983.    Has not yet had flu vaccine.  He is willing to consider at this time.  He has had previous pneumonia vaccinations.  Past Medical History:  Diagnosis Date  . Back pain   . Fungal nail infection   . Hyperlipidemia   . Hypertension    Past Surgical History:  Procedure Laterality Date  . ankle surgery trauma    . eye lid surgery    . fungal nail      reports that he quit smoking about 36 years ago. He has never used smokeless tobacco. He reports that he drinks alcohol. He reports that he does not use drugs. family history includes Alcohol abuse in his father; Arthritis in his sister; COPD in his sister; Cancer in his father; Heart disease in his mother; Liver disease in his father. Allergies  Allergen Reactions  . Dust Mite Extract   . Grass Extracts [Gramineae Pollens]      Review of Systems  Constitutional: Negative for appetite change, chills, fever and unexpected weight change.  Respiratory: Positive for cough. Negative for shortness of breath and wheezing.   Cardiovascular: Negative for chest pain.       Objective:   Physical Exam  Constitutional: He appears well-developed and well-nourished.  Cardiovascular: Normal rate and regular rhythm.  Pulmonary/Chest: Effort normal and breath sounds normal. He has no wheezes. He has no rales.       Assessment:     Community-acquired right upper lobe pneumonia.  Clinically improved.  Now afebrile.  Cough almost resolved    Plan:     -Follow-up chest x-ray -shows improvement in previously noted right  upper lobe infiltrate.  This will be over read -Flu vaccine given -Follow-up immediately for any fever, shortness of breath, or other concerns  [name] MD [facility]

## 2018-05-29 NOTE — Patient Instructions (Signed)
Follow up for any recurrent fever, increased shortness of breath, or other concerns.

## 2018-05-29 NOTE — Addendum Note (Signed)
Addended by: Westley Hummer B on: 05/29/2018 04:47 PM   Modules accepted: Orders

## 2018-06-29 ENCOUNTER — Telehealth: Payer: Self-pay | Admitting: Family Medicine

## 2018-06-29 NOTE — Telephone Encounter (Signed)
Copied from [place] [id number]. Topic: General - Other >> Jun 29, 2018 12:09 PM Antonieta Iba C wrote: Reason for CRM: pt says that he was seen by PCP a little bit ago and was Dx with pneumonia, pt says now he is having the same symptoms again.. congestion and coughing up mucus. Pt would like to know if provider could prescribe him something to prevent things progressing into pneumonia again?   Pharmacy: [id number] - [place], [phone] (Phone) [phone] (Fax)   CB: [phone]

## 2018-06-29 NOTE — Telephone Encounter (Signed)
Let's send in 7 day course of Doxycycline 100 mg po bid and needs office follow up if not better in one week.

## 2018-06-29 NOTE — Telephone Encounter (Signed)
Called patient and let him know that he probably needs to come in to be seen since it has been a month and patient requested that I send a message to Dr. Elease Hashimoto and ask if he can send in more of the Doxycycline to the pharmacy requested?  Please advise.

## 2018-06-30 ENCOUNTER — Other Ambulatory Visit: Payer: Self-pay

## 2018-06-30 MED ORDER — DOXYCYCLINE HYCLATE 100 MG PO TABS: 100.0000 mg | ORAL_TABLET | Freq: Two times a day (BID) | ORAL | 0 refills | Status: DC

## 2018-06-30 NOTE — Telephone Encounter (Signed)
Called patient to let him know that I have sent in his prescription and he stated that he takes Aspirin 325 mg twice daily and Alka-seltzer at night and he wants to know if it is okay to take this while he is taking the Doxycycline?  Please advise.

## 2018-06-30 NOTE — Telephone Encounter (Signed)
yes

## 2018-06-30 NOTE — Telephone Encounter (Signed)
Called patient and gave him the response from Dr. Elease Hashimoto that it was OK to take the Aspirin 325 and the Alka-seltzer while on the Doxycycline. Patient verbalized an understanding.

## 2018-09-05 ENCOUNTER — Encounter: Payer: Self-pay | Admitting: Family Medicine

## 2018-09-05 ENCOUNTER — Ambulatory Visit (INDEPENDENT_AMBULATORY_CARE_PROVIDER_SITE_OTHER): Payer: Medicare Other | Admitting: Family Medicine

## 2018-09-05 ENCOUNTER — Other Ambulatory Visit: Payer: Self-pay

## 2018-09-05 VITALS — BP 124/78 | HR 74 | Temp 97.8°F | Ht 67.25 in | Wt 155.2 lb

## 2018-09-05 DIAGNOSIS — H269 Unspecified cataract: Secondary | ICD-10-CM

## 2018-09-05 DIAGNOSIS — I10 Essential (primary) hypertension: Secondary | ICD-10-CM

## 2018-09-05 DIAGNOSIS — I4510 Unspecified right bundle-branch block: Secondary | ICD-10-CM

## 2018-09-05 DIAGNOSIS — E785 Hyperlipidemia, unspecified: Secondary | ICD-10-CM

## 2018-09-05 DIAGNOSIS — E039 Hypothyroidism, unspecified: Secondary | ICD-10-CM

## 2018-09-05 DIAGNOSIS — I491 Atrial premature depolarization: Secondary | ICD-10-CM | POA: Diagnosis not present

## 2018-09-05 NOTE — Progress Notes (Signed)
Subjective:     Patient ID: Ryan [name], male   DOB: 10-04-1945, 73 y.o.   MRN: [id number]  HPI Patient is seen for medical follow-up.  He was just at the [place] yesterday and has been diagnosed with bilateral cataracts and will be getting surgery soon for that.  He states he had several labs drawn.  He had EKG which showed incomplete right bundle branch block along with some occasional PACs.  These are asymptomatic.  He does drink considerable amount of caffeine.  No beta-blocker use  He has hyperlipidemia and hypothyroidism and is on replacement.  Presumably had thyroid functions and lipids done yesterday.  He is due for repeat colonoscopy and is undecided at this point whether he will do colonoscopy versus Cologuard.  Generally feels well overall.  No specific complaints No recent chest pains or dizziness.  No syncope.  Past Medical History:  Diagnosis Date  . Back pain   . Fungal nail infection   . Hyperlipidemia   . Hypertension    Past Surgical History:  Procedure Laterality Date  . ankle surgery trauma    . eye lid surgery    . fungal nail      reports that he quit smoking about 37 years ago. He has never used smokeless tobacco. He reports current alcohol use. He reports that he does not use drugs. family history includes Alcohol abuse in his father; Arthritis in his sister; COPD in his sister; Cancer in his father; Heart disease in his mother; Liver disease in his father. Allergies  Allergen Reactions  . Dust Mite Extract   . Grass Extracts [Gramineae Pollens]      Review of Systems  Constitutional: Negative for fatigue.  Eyes: Negative for visual disturbance.  Respiratory: Negative for cough, chest tightness and shortness of breath.   Cardiovascular: Negative for chest pain, palpitations and leg swelling.  Neurological: Negative for dizziness, syncope, weakness, light-headedness and headaches.       Objective:   Physical Exam Constitutional:      Appearance:  He is well-developed.  HENT:     Right Ear: External ear normal.     Left Ear: External ear normal.  Eyes:     Pupils: Pupils are equal, round, and reactive to light.  Neck:     Musculoskeletal: Neck supple.     Thyroid: No thyromegaly.  Cardiovascular:     Rate and Rhythm: Normal rate and regular rhythm.  Pulmonary:     Effort: Pulmonary effort is normal. No respiratory distress.     Breath sounds: Normal breath sounds. No wheezing or rales.  Neurological:     Mental Status: He is alert and oriented to person, place, and time.        Assessment:     #1 hyperlipidemia  #2 hypothyroidism  #3 incomplete right bundle branch block and PACs by recent EKG    Plan:     -Patient had some questions regarding right bundle-branch block and PACs and we tried to answer these the best we could.  He is asymptomatic -We did recommend gradually scaling back caffeine which can help diminish frequency of PACs though not clear these are symptomatic at this time -No labs since these were done yesterday through the [place] -We will plan routine follow-up in 6 months and sooner as needed -He will try to get copy of labs from [place] when they are available for [place] to review  [name] MD [facility] at [facility]

## 2018-09-06 DIAGNOSIS — C61 Malignant neoplasm of prostate: Secondary | ICD-10-CM | POA: Diagnosis not present

## 2018-09-11 DIAGNOSIS — H524 Presbyopia: Secondary | ICD-10-CM | POA: Diagnosis not present

## 2018-09-11 DIAGNOSIS — H2513 Age-related nuclear cataract, bilateral: Secondary | ICD-10-CM | POA: Diagnosis not present

## 2018-09-11 DIAGNOSIS — Z9889 Other specified postprocedural states: Secondary | ICD-10-CM | POA: Diagnosis not present

## 2018-09-13 DIAGNOSIS — C61 Malignant neoplasm of prostate: Secondary | ICD-10-CM | POA: Diagnosis not present

## 2018-09-13 DIAGNOSIS — N5201 Erectile dysfunction due to arterial insufficiency: Secondary | ICD-10-CM | POA: Diagnosis not present

## 2018-11-24 ENCOUNTER — Other Ambulatory Visit: Payer: Self-pay | Admitting: Urology

## 2018-11-24 ENCOUNTER — Other Ambulatory Visit: Payer: Self-pay | Admitting: Psychiatry

## 2018-11-24 DIAGNOSIS — C61 Malignant neoplasm of prostate: Secondary | ICD-10-CM

## 2018-11-29 DIAGNOSIS — H2511 Age-related nuclear cataract, right eye: Secondary | ICD-10-CM | POA: Diagnosis not present

## 2018-11-29 DIAGNOSIS — H2512 Age-related nuclear cataract, left eye: Secondary | ICD-10-CM | POA: Diagnosis not present

## 2018-11-29 DIAGNOSIS — H25012 Cortical age-related cataract, left eye: Secondary | ICD-10-CM | POA: Diagnosis not present

## 2018-11-29 DIAGNOSIS — H25011 Cortical age-related cataract, right eye: Secondary | ICD-10-CM | POA: Diagnosis not present

## 2018-11-29 HISTORY — PX: CATARACT EXTRACTION: SUR2

## 2018-12-01 ENCOUNTER — Ambulatory Visit (INDEPENDENT_AMBULATORY_CARE_PROVIDER_SITE_OTHER): Payer: Medicare Other | Admitting: Ophthalmology

## 2018-12-01 ENCOUNTER — Other Ambulatory Visit: Payer: Self-pay

## 2018-12-01 ENCOUNTER — Encounter (INDEPENDENT_AMBULATORY_CARE_PROVIDER_SITE_OTHER): Payer: Self-pay | Admitting: Ophthalmology

## 2018-12-01 DIAGNOSIS — I10 Essential (primary) hypertension: Secondary | ICD-10-CM

## 2018-12-01 DIAGNOSIS — H40052 Ocular hypertension, left eye: Secondary | ICD-10-CM

## 2018-12-01 DIAGNOSIS — H35033 Hypertensive retinopathy, bilateral: Secondary | ICD-10-CM

## 2018-12-01 DIAGNOSIS — Z961 Presence of intraocular lens: Secondary | ICD-10-CM | POA: Diagnosis not present

## 2018-12-01 DIAGNOSIS — H35371 Puckering of macula, right eye: Secondary | ICD-10-CM | POA: Diagnosis not present

## 2018-12-01 DIAGNOSIS — H3581 Retinal edema: Secondary | ICD-10-CM | POA: Diagnosis not present

## 2018-12-01 DIAGNOSIS — H25811 Combined forms of age-related cataract, right eye: Secondary | ICD-10-CM

## 2018-12-01 DIAGNOSIS — H59022 Cataract (lens) fragments in eye following cataract surgery, left eye: Secondary | ICD-10-CM | POA: Diagnosis not present
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
## 2018-12-04 ENCOUNTER — Other Ambulatory Visit: Payer: Self-pay

## 2018-12-04 ENCOUNTER — Ambulatory Visit (INDEPENDENT_AMBULATORY_CARE_PROVIDER_SITE_OTHER): Payer: Medicare Other | Admitting: Ophthalmology

## 2018-12-04 ENCOUNTER — Encounter (INDEPENDENT_AMBULATORY_CARE_PROVIDER_SITE_OTHER): Payer: Self-pay | Admitting: Ophthalmology

## 2018-12-04 DIAGNOSIS — H35371 Puckering of macula, right eye: Secondary | ICD-10-CM | POA: Diagnosis not present

## 2018-12-04 DIAGNOSIS — H40052 Ocular hypertension, left eye: Secondary | ICD-10-CM | POA: Diagnosis not present

## 2018-12-04 DIAGNOSIS — Z961 Presence of intraocular lens: Secondary | ICD-10-CM | POA: Diagnosis not present

## 2018-12-04 DIAGNOSIS — H25811 Combined forms of age-related cataract, right eye: Secondary | ICD-10-CM

## 2018-12-04 DIAGNOSIS — H3581 Retinal edema: Secondary | ICD-10-CM

## 2018-12-04 DIAGNOSIS — H35033 Hypertensive retinopathy, bilateral: Secondary | ICD-10-CM

## 2018-12-04 DIAGNOSIS — H59022 Cataract (lens) fragments in eye following cataract surgery, left eye: Secondary | ICD-10-CM

## 2018-12-04 DIAGNOSIS — I10 Essential (primary) hypertension: Secondary | ICD-10-CM | POA: Diagnosis not present

## 2018-12-04 MED ORDER — DIFLUPREDNATE 0.05 % OP EMUL: 1.0000 [drp] | Freq: Every day | OPHTHALMIC | 0 refills | Status: DC

## 2018-12-04 NOTE — Progress Notes (Signed)
[facility] Note  12/04/2018     CHIEF COMPLAINT Patient presents for Retina Follow Up   HISTORY OF PRESENT ILLNESS: Ryan [name] is a 73 y.o. male who presents to the clinic today for:   HPI    Retina Follow Up    Patient presents with  Other.  In left eye.  This started 4 days ago.  Since onset it is stable.  I, the attending physician,  performed the HPI with the patient and updated documentation appropriately.          Comments    F/U Retined lens material OS. Patient states he has noticed a "large material" covering his vision when he looks down toward his feet, "It completely covers my vision" Pt is using PF, ofloxcin, dorzolamide, brimonidine as instructed       Last edited by [name], MD on 12/04/2018 11:05 AM. (History)    pt states he feels like his VA has improved, but the debris in his eye has not, he states if he tilts his head down there is a new piece of debris that is white and opaque and completely blocks his vision, he states when he is looking straight ahead it still looks like "chopped spinach" floating around in his eye  Referring physician: [name], MD [place], [place] [address]  HISTORICAL INFORMATION:   Selected notes from the [facility] Referred by Dr. Rutherford Guys for concern of possible endophthalmitis OS LEE: 05.29.20 [name]) Ocular Hx-s/p cat sx 05.27.20 PMH-   CURRENT MEDICATIONS: Current Outpatient Medications (Ophthalmic Drugs)  Medication Sig  . Difluprednate 0.05 % EMUL Place 1 drop into the left eye 6 (six) times daily.  . ILEVRO 0.3 % ophthalmic suspension INSTILL 1 DROP INTO LEFT EYE ONCE A DAY AS NEEDED  . ofloxacin (OCUFLOX) 0.3 % ophthalmic solution Instill 1 drop BID in operative eye starting 2 days prior to surgery and after surgery for 3 weeks.   No current facility-administered medications for this visit.  (Ophthalmic Drugs)   Current Outpatient  Medications (Other)  Medication Sig  . acyclovir (ZOVIRAX) 400 MG tablet Take 1 tablet (400 mg total) by mouth daily.  [name] THYROID 60 MG tablet TAKE 1 TABLET DAILY  . Ascorbic Acid (VITAMIN C) 1000 MG tablet Take 1,000 mg by mouth daily.    Marland Kitchen atorvastatin (LIPITOR) 10 MG tablet Take 10 mg by mouth daily.  . cetirizine (ZYRTEC) 10 MG tablet Take 10 mg by mouth daily.  . Cholecalciferol (VITAMIN D) 1000 UNITS capsule Take 1,000 Units by mouth daily.    Marland Kitchen co-enzyme Q-10 30 MG capsule Take 100 mg by mouth 2 (two) times daily.   . Cyanocobalamin (VITAMIN B-12 CR) 1500 MCG TBCR Take by mouth.    . Digestive Enzymes (DIGESTIVE ENZYME PO) Take 1 tablet by mouth 3 (three) times daily after meals.  Marland Kitchen loratadine (CLARITIN) 10 MG tablet Take 10 mg by mouth daily.  . Magnesium Citrate 100 MG TABS Take 400 mg by mouth.  . tadalafil (CIALIS) 5 MG tablet Take 5 mg by mouth daily.  . vitamin E 400 UNIT capsule Take 400 Units by mouth daily.    . Zinc 50 MG CAPS Take by mouth.     No current facility-administered medications for this visit.  (Other)      REVIEW OF SYSTEMS: ROS    Positive for: Eyes   Negative for: Constitutional, Gastrointestinal, Neurological, Skin,  Genitourinary, Musculoskeletal, HENT, Endocrine, Cardiovascular, Respiratory, Psychiatric, Allergic/Imm, Heme/Lymph   Last edited by Zenovia Jordan, LPN on 10/03/9620  2:97 AM. (History)       ALLERGIES Allergies  Allergen Reactions  . Dust Mite Extract   . Grass Extracts [Gramineae Pollens]     PAST MEDICAL HISTORY Past Medical History:  Diagnosis Date  . Back pain   . Fungal nail infection   . Hyperlipidemia   . Hypertension    Past Surgical History:  Procedure Laterality Date  . ankle surgery trauma    . CATARACT EXTRACTION Left 11/29/2018   Dr. [name]  . eye lid surgery    . EYE SURGERY    . fungal nail    . LASIK Bilateral     FAMILY HISTORY Family History  Problem Relation Age of Onset  . Heart  disease Mother   . Liver disease Father   . Alcohol abuse Father   . Cancer Father        hepatic cancer ?primary  . Arthritis Sister   . COPD Sister     SOCIAL HISTORY Social History   Tobacco Use  . Smoking status: Former Smoker    Last attempt to quit: 07/05/1981    Years since quitting: 37.4  . Smokeless tobacco: Never Used  Substance Use Topics  . Alcohol use: Yes  . Drug use: No         OPHTHALMIC EXAM:  Base Eye Exam    Visual Acuity (Snellen - Linear)      Right Left   Dist Erma 20/20 -2 20/20 -1   Dist ph [place] NI NI       Tonometry (Tonopen, 10:02 AM)      Right Left   Pressure 12 18       Pupils      Dark Light Shape React APD   Right 4 3 Round Brisk None   Left 4 3 Round Slow None       Visual Fields (Counting fingers)      Left Right     Full   Restrictions Partial outer superior temporal, inferior temporal deficiencies        Extraocular Movement      Right Left    Full, Ortho Full, Ortho       Neuro/Psych    Oriented x3:  Yes   Mood/Affect:  Normal       Dilation    Both eyes:  1.0% Mydriacyl, 2.5% Phenylephrine @ 10:01 AM        Slit Lamp and Fundus Exam    Slit Lamp Exam      Right Left   Lids/Lashes Dermatochalasis - upper lid, Meibomian gland dysfunction Dermatochalasis - upper lid, Meibomian gland dysfunction, Telangiectasia   Conjunctiva/Sclera Trace nasal Pinguecula mild nasal pterygeium    Cornea Arcus, 1+ Punctate epithelial erosions, abrely visible lasik flap 1+ Punctate epithelial erosions, nasal pterygeium, lasik flap in good position with epithelial in growth at 0430   Anterior Chamber Deep and quiet 2-3+ Cell/pigment, no hypopyon, retained cortical material 0600 angle -- disintegrating, TID's from 9892-1194   Iris Round and dilated Round and poorly dilated, prominent vessels -- improved   Lens 2+ Nuclear sclerosis, 1-2+ Cortical cataract sulcus IOL in good position   Vitreous Vitreous syneresis mild Vitreous syneresis,  +cell/pigment, retained lens material inferior vitreous--mobile, cortical debris settling inferiorly       Fundus Exam      Right Left   Disc Pink and Sharp  Pink and Sharp   C/D Ratio 0.2 0.3   Macula Flat, Blunted foveal reflex, Retinal pigment epithelial mottling, No heme or edema Flat, Blunted foveal reflex, No heme or edema   Vessels Mild Vascular attenuation Vascular attenuation, mild AV crossing changes   Periphery Attached    limited vew due to poor dilation, but grossly attached without RT/RD, retianed lens material inferior vitreous--mobile, cortical debris settling inferiorly          IMAGING AND PROCEDURES  Imaging and Procedures for @TODAY @  OCT, Retina - OU - Both Eyes       Right Eye Quality was good. Central Foveal Thickness: 322. Progression has been stable. Findings include abnormal foveal contour, no IRF, no SRF, epiretinal membrane, macular pucker.   Left Eye Quality was good. Central Foveal Thickness: 283. Progression has improved. Findings include normal foveal contour, no IRF, no SRF (Interval improvement in vitreous opacities).   Notes *Images captured and stored on drive  Diagnosis / Impression:   OD: mild ERM with macular pucker  OS: NFP, no IRF, no SRF; Interval improvement in vitreous opacities   Clinical management:  See below  Abbreviations: NFP - Normal foveal profile. CME - cystoid macular edema. PED - pigment epithelial detachment. IRF - intraretinal fluid. SRF - subretinal fluid. EZ - ellipsoid zone. ERM - epiretinal membrane. ORA - outer retinal atrophy. ORT - outer retinal tubulation. SRHM - subretinal hyper-reflective material        Color Fundus Photography Optos - OU - Both Eyes       Right Eye Progression has no prior data. Disc findings include normal observations. Macula : normal observations. Vessels : attenuated. Periphery : normal observations.   Left Eye Progression has no prior data. Disc findings include normal  observations. Macula : normal observations. Vessels : attenuated. Periphery : (Retained cortical lens fragments and pigmented debris inferiorly).   Notes **Images stored on drive**  Impression: OD: normal OS: retained cortical lens fragments and pigmented debris within inferior vitreous                 ASSESSMENT/PLAN:    ICD-10-CM   1. Retained lens material following cataract surgery of left eye H59.022 Color Fundus Photography Optos - OU - Both Eyes  2. Ocular hypertension, left H40.052   3. Epiretinal membrane (ERM) of right eye H35.371   4. Retinal edema H35.81 OCT, Retina - OU - Both Eyes  5. Essential hypertension I10   6. Hypertensive retinopathy of both eyes H35.033   7. Combined forms of age-related cataract of right eye H25.811   8. Pseudophakia Z96.1     1,2. Retained Lens Material with ocular hypertension OS  - complicated CE + sulcus IOL OS (5.27.20, Shapiro) -- posterior capsule rupture and anterior vitrectomy  - sulcus IOL in excellent position -- stable  - small cortical fragments in vitreous (mobile) and one small piece in inferior angle (fixed)  - tMax 42 OS on POD2 in Dr. Kellie Moor office -- improved to 22 on [place]  - today, BCVA and IOP improved (20/20 OS, 18 mmHg)  - cont durezol 6x / day OS  - continue antibiotic (vigamox?) QID OS until bottle runs out  - cont cosopt and brimonidine TID OS  - discussed possible need for PPV OS to remove cortical remnants if they fail to resolve medically  - pt very bothered by floaters OS, but wishes to continue to observe for now  - f/u 1 wk  3,4. Epiretinal membrane, right  eye   - The natural history, anatomy, potential for loss of vision, and treatment options including vitrectomy techniques and the complications of endophthalmitis, retinal detachment, vitreous hemorrhage, cataract progression and permanent vision loss discussed with the patient.  - mild ERM  - asymptomatic, no metamorphopsia  - no  indication for surgery at this time  - monitor for now  5,6. Hypertensive retinopathy OU  - discussed importance of tight BP control  - monitor  7. Mixed form age related cataract OD  - under the expert management of Dr. [name]  - was scheduled for surgery on Wednesday  8. Pseudophakia OS  - s/p CE + sulcus IOL (5.27.20, Dr. [name])  - IOL in good position  - retained lens material as above   Ophthalmic Meds Ordered this visit:  Meds ordered this encounter  Medications  . Difluprednate 0.05 % EMUL    Sig: Place 1 drop into the left eye 6 (six) times daily.    Dispense:  15 mL    Refill:  0       Return in about 1 week (around 12/11/2018) for f/u retained lens material OS.  There are no Patient Instructions on file for this visit.   Explained the diagnoses, plan, and follow up with the patient and they expressed understanding.  Patient expressed understanding of the importance of proper follow up care.   This document serves as a record of services personally performed by Gardiner Sleeper, MD, PhD. It was created on their behalf by Ernest Mallick, OA, an ophthalmic assistant. The creation of this record is the provider's dictation and/or activities during the visit.    Electronically signed by: Ernest Mallick, OA  06.01.2020 4:43 PM    Gardiner Sleeper, M.D., Ph.D. Diseases & Surgery of the Retina and Vitreous [facility]  I have reviewed the above documentation for accuracy and completeness, and I agree with the above. Gardiner Sleeper, M.D., Ph.D. 12/04/18 4:43 PM    Abbreviations: M myopia (nearsighted); A astigmatism; H hyperopia (farsighted); P presbyopia; Mrx spectacle prescription;  CTL contact lenses; OD right eye; OS left eye; OU both eyes  XT exotropia; ET esotropia; PEK punctate epithelial keratitis; PEE punctate epithelial erosions; DES dry eye syndrome; MGD meibomian gland dysfunction; ATs artificial tears; PFAT's preservative free  artificial tears; [place] nuclear sclerotic cataract; PSC posterior subcapsular cataract; ERM epi-retinal membrane; PVD posterior vitreous detachment; RD retinal detachment; DM diabetes mellitus; DR diabetic retinopathy; NPDR non-proliferative diabetic retinopathy; PDR proliferative diabetic retinopathy; CSME clinically significant macular edema; DME diabetic macular edema; dbh dot blot hemorrhages; CWS cotton wool spot; POAG primary open angle glaucoma; C/D cup-to-disc ratio; HVF humphrey visual field; GVF goldmann visual field; OCT optical coherence tomography; IOP intraocular pressure; BRVO Branch retinal vein occlusion; CRVO central retinal vein occlusion; CRAO central retinal artery occlusion; BRAO branch retinal artery occlusion; RT retinal tear; SB scleral buckle; PPV pars plana vitrectomy; VH Vitreous hemorrhage; PRP panretinal laser photocoagulation; IVK intravitreal kenalog; VMT vitreomacular traction; MH Macular hole;  NVD neovascularization of the disc; NVE neovascularization elsewhere; AREDS age related eye disease study; ARMD age related macular degeneration; POAG primary open angle glaucoma; EBMD epithelial/anterior basement membrane dystrophy; ACIOL anterior chamber intraocular lens; IOL intraocular lens; PCIOL posterior chamber intraocular lens; Phaco/IOL phacoemulsification with intraocular lens placement; [place] photorefractive keratectomy; LASIK laser assisted in situ keratomileusis; HTN hypertension; DM diabetes mellitus; COPD chronic obstructive pulmonary disease

## 2018-12-11 NOTE — Progress Notes (Signed)
[facility] Note  12/12/2018     CHIEF COMPLAINT Patient presents for Retina Follow Up   HISTORY OF PRESENT ILLNESS: Ryan [name] is a 73 y.o. male who presents to the clinic today for:   HPI    Retina Follow Up    Patient presents with  Other (Retained lens material OS).  In left eye.  Severity is moderate.  Duration of 1 week.  Since onset it is gradually improving.  I, the attending physician,  performed the HPI with the patient and updated documentation appropriately.          Comments    Patient states debris floating in OS blurs vision, but when debris moves out of central vision, patient can see clearly OS. Some scratchiness OS. On durezol 6 times daily OS, cosopt and brimonidine tid OS, and ocuflox qid OS.       Last edited by [name], MD on 12/12/2018 10:15 AM. (History)    pt states still sees floaters OS-not improving   Referring physician: [name], MD [place], [place] [address]  HISTORICAL INFORMATION:   Selected notes from the MEDICAL RECORD NUMBER Referred by Dr. Rutherford Guys for concern of possible endophthalmitis OS LEE: 05.29.20 [name]) Ocular Hx-s/p cat sx 05.27.20 PMH-   CURRENT MEDICATIONS: Current Outpatient Medications (Ophthalmic Drugs)  Medication Sig  . brimonidine (ALPHAGAN) 0.2 % ophthalmic solution Place 1 drop into the left eye 3 (three) times daily.  . Difluprednate 0.05 % EMUL Place 1 drop into the left eye 6 (six) times daily.  . dorzolamide-timolol (COSOPT) 22.3-6.8 MG/ML ophthalmic solution Place 1 drop into the left eye 3 (three) times daily.  Marland Kitchen ofloxacin (OCUFLOX) 0.3 % ophthalmic solution Place 1 drop into the left eye 4 (four) times daily.   . ILEVRO 0.3 % ophthalmic suspension INSTILL 1 DROP INTO LEFT EYE ONCE A DAY AS NEEDED   No current facility-administered medications for this visit.  (Ophthalmic Drugs)   Current Outpatient Medications (Other)   Medication Sig  . acyclovir (ZOVIRAX) 400 MG tablet Take 1 tablet (400 mg total) by mouth daily.  [name] THYROID 60 MG tablet TAKE 1 TABLET DAILY  . Ascorbic Acid (VITAMIN C) 1000 MG tablet Take 1,000 mg by mouth daily.    Marland Kitchen atorvastatin (LIPITOR) 10 MG tablet Take 10 mg by mouth daily.  . cetirizine (ZYRTEC) 10 MG tablet Take 10 mg by mouth daily.  . Cholecalciferol (VITAMIN D) 1000 UNITS capsule Take 1,000 Units by mouth daily.    Marland Kitchen co-enzyme Q-10 30 MG capsule Take 100 mg by mouth 2 (two) times daily.   . Cyanocobalamin (VITAMIN B-12 CR) 1500 MCG TBCR Take by mouth.    . Digestive Enzymes (DIGESTIVE ENZYME PO) Take 1 tablet by mouth 3 (three) times daily after meals.  Marland Kitchen loratadine (CLARITIN) 10 MG tablet Take 10 mg by mouth daily.  . Magnesium Citrate 100 MG TABS Take 400 mg by mouth.  . tadalafil (CIALIS) 5 MG tablet Take 5 mg by mouth daily.  . vitamin E 400 UNIT capsule Take 400 Units by mouth daily.    . Zinc 50 MG CAPS Take by mouth.     No current facility-administered medications for this visit.  (Other)      REVIEW OF SYSTEMS: ROS    Positive for: Eyes   Negative for: Constitutional, Gastrointestinal, Neurological, Skin, Genitourinary, Musculoskeletal, HENT, Endocrine, Cardiovascular, Respiratory, Psychiatric, Allergic/Imm, Heme/Lymph   Last  edited by Roselee Nova D on 12/12/2018  9:04 AM. (History)       ALLERGIES Allergies  Allergen Reactions  . Dust Mite Extract   . Grass Extracts [Gramineae Pollens]     PAST MEDICAL HISTORY Past Medical History:  Diagnosis Date  . Back pain   . Fungal nail infection   . Hyperlipidemia   . Hypertension    Past Surgical History:  Procedure Laterality Date  . ankle surgery trauma    . CATARACT EXTRACTION Left 11/29/2018   Dr. [name]  . eye lid surgery    . EYE SURGERY    . fungal nail    . LASIK Bilateral     FAMILY HISTORY Family History  Problem Relation Age of Onset  . Heart disease Mother   . Liver  disease Father   . Alcohol abuse Father   . Cancer Father        hepatic cancer ?primary  . Arthritis Sister   . COPD Sister     SOCIAL HISTORY Social History   Tobacco Use  . Smoking status: Former Smoker    Last attempt to quit: 07/05/1981    Years since quitting: 37.4  . Smokeless tobacco: Never Used  Substance Use Topics  . Alcohol use: Yes  . Drug use: No         OPHTHALMIC EXAM:  Base Eye Exam    Visual Acuity (Snellen - Linear)      Right Left   Dist Beecher 20/25 +2 20/20 -1   Dist ph Bernie NI        Tonometry (Tonopen, 9:18 AM)      Right Left   Pressure 15 12       Pupils      Dark Light Shape React APD   Right 3 2 Round Brisk None   Left 3 2.5 Round Minimal None       Visual Fields (Counting fingers)      Left Right    Full Full  No VF defect noted today OS       Extraocular Movement      Right Left    Full, Ortho Full, Ortho       Neuro/Psych    Oriented x3:  Yes   Mood/Affect:  Normal       Dilation    Both eyes:  1.0% Mydriacyl, 2.5% Phenylephrine @ 9:18 AM        Slit Lamp and Fundus Exam    Slit Lamp Exam      Right Left   Lids/Lashes Dermatochalasis - upper lid, Meibomian gland dysfunction Dermatochalasis - upper lid, Meibomian gland dysfunction, Telangiectasia   Conjunctiva/Sclera Trace nasal Pinguecula mild nasal pterygeium    Cornea Arcus, 1+ Punctate epithelial erosions, abrely visible lasik flap 1+ Punctate epithelial erosions, trace Descemet's folds, nasal pterygeium, lasik flap in good position with epithelial in growth at 0430,    Anterior Chamber Deep and quiet 1+ Cell/pigment, no hypopyon, retained cortical material 0600 angle -- resolved, TID's from 7989-2119   Iris Round and dilated Round and poorly dilated, prominent vessels -- improved   Lens 2+ Nuclear sclerosis, 1-2+ Cortical cataract sulcus IOL in good position   Vitreous Vitreous syneresis mild Vitreous syneresis, +cell/pigment, retained lens material inferior  vitreous--mobile, cortical debris settling inferiorly       Fundus Exam      Right Left   Disc Pink and Sharp Pink and Sharp   C/D Ratio 0.2 0.3   Macula  Flat, Blunted foveal reflex, Retinal pigment epithelial mottling, No heme or edema Flat, Blunted foveal reflex, mild RPE changes No heme or edema   Vessels Mild Vascular attenuation Vascular attenuation, mild AV crossing changes   Periphery Attached    limited vew due to poor dilation, but grossly attached without RT/RD, retianed lens material inferior vitreous--mobile, cortical debris settling inferiorly, vitreous condensations inferiorly          IMAGING AND PROCEDURES  Imaging and Procedures for @TODAY @  OCT, Retina - OU - Both Eyes       Right Eye Quality was good. Central Foveal Thickness: 317. Progression has been stable. Findings include abnormal foveal contour, no IRF, no SRF, epiretinal membrane, macular pucker.   Left Eye Quality was good. Central Foveal Thickness: 288. Progression has improved. Findings include normal foveal contour, no IRF, no SRF (Interval improvement in vitreous opacities).   Notes *Images captured and stored on drive  Diagnosis / Impression:   OD: mild ERM with macular pucker --stable OS: NFP, no IRF, no SRF; Interval improvement in vitreous opacities--stable   Clinical management:  See below  Abbreviations: NFP - Normal foveal profile. CME - cystoid macular edema. PED - pigment epithelial detachment. IRF - intraretinal fluid. SRF - subretinal fluid. EZ - ellipsoid zone. ERM - epiretinal membrane. ORA - outer retinal atrophy. ORT - outer retinal tubulation. SRHM - subretinal hyper-reflective material                 ASSESSMENT/PLAN:    ICD-10-CM   1. Retained lens material following cataract surgery of left eye H59.022   2. Ocular hypertension, left H40.052   3. Epiretinal membrane (ERM) of right eye H35.371   4. Retinal edema H35.81 OCT, Retina - OU - Both Eyes  5. Essential  hypertension I10   6. Hypertensive retinopathy of both eyes H35.033   7. Combined forms of age-related cataract of right eye H25.811   8. Pseudophakia Z96.1     1,2. Retained Lens Material with ocular hypertension OS  - complicated CE + sulcus IOL OS (5.27.20, Shapiro) -- posterior capsule rupture and anterior vitrectomy  - sulcus IOL in excellent position -- stable  - small cortical fragments in vitreous persist, but small piece in inferior angle now resolved  - tMax 42 OS on POD2 in Dr. Kellie Moor office -- improved to 22 on Combigan  - today, BCVA and IOP improved (20/20 OS, 12 mmHg)  - cont durezol 6x / day OS  - continue antibiotic (vigamox?) QID OS until bottle runs out  - cont cosopt and brimonidine TID OS  - discussed possible need for PPV OS to remove cortical remnants if they fail to resolve                            medically, also discussed possible need for sutured IOL OS  - pt very bothered by floaters OS-patient wishes to proceed with PPV OS if hospital can allow                  spouse to be at hospital during surgery.  - f/u 2-3 wks  3,4. Epiretinal membrane, right eye   - The natural history, anatomy, potential for loss of vision, and treatment options including vitrectomy techniques and the complications of endophthalmitis, retinal detachment, vitreous hemorrhage, cataract progression and permanent vision loss discussed with the patient.  - mild ERM  - asymptomatic, no metamorphopsia  - no indication  for surgery at this time  - monitor for now  5,6. Hypertensive retinopathy OU  - discussed importance of tight BP control  - monitor  7. Mixed form age related cataract OD  - under the expert management of Dr. [name]  - surgery OD has been postponed due to issues OS  8. Pseudophakia OS  - s/p CE + sulcus IOL (5.27.20, Dr. [name])  - IOL in good position  - retained lens material as above   Ophthalmic Meds Ordered this visit:  Meds ordered this encounter   Medications  . brimonidine (ALPHAGAN) 0.2 % ophthalmic solution    Sig: Place 1 drop into the left eye 3 (three) times daily.    Dispense:  10 mL    Refill:  10  . dorzolamide-timolol (COSOPT) 22.3-6.8 MG/ML ophthalmic solution    Sig: Place 1 drop into the left eye 3 (three) times daily.    Dispense:  10 mL    Refill:  10       Return in about 2 weeks (around 12/26/2018) for DFE, OCT, Colors.  There are no Patient Instructions on file for this visit.   Explained the diagnoses, plan, and follow up with the patient and they expressed understanding.  Patient expressed understanding of the importance of proper follow up care.   This document serves as a record of services personally performed by Gardiner Sleeper, MD, PhD. It was created on their behalf by Ernest Mallick, OA, an ophthalmic assistant. The creation of this record is the provider's dictation and/or activities during the visit.    Electronically signed by: Ernest Mallick, OA  06.08.2020 12:51 PM     Gardiner Sleeper, M.D., Ph.D. Diseases & Surgery of the Retina and Vitreous [facility]  I have reviewed the above documentation for accuracy and completeness, and I agree with the above. Gardiner Sleeper, M.D., Ph.D. 12/12/18 12:54 PM    Abbreviations: M myopia (nearsighted); A astigmatism; H hyperopia (farsighted); P presbyopia; Mrx spectacle prescription;  CTL contact lenses; OD right eye; OS left eye; OU both eyes  XT exotropia; ET esotropia; PEK punctate epithelial keratitis; PEE punctate epithelial erosions; DES dry eye syndrome; MGD meibomian gland dysfunction; ATs artificial tears; PFAT's preservative free artificial tears; [place] nuclear sclerotic cataract; PSC posterior subcapsular cataract; ERM epi-retinal membrane; PVD posterior vitreous detachment; RD retinal detachment; DM diabetes mellitus; DR diabetic retinopathy; NPDR non-proliferative diabetic retinopathy; PDR proliferative diabetic retinopathy;  CSME clinically significant macular edema; DME diabetic macular edema; dbh dot blot hemorrhages; CWS cotton wool spot; POAG primary open angle glaucoma; C/D cup-to-disc ratio; HVF humphrey visual field; GVF goldmann visual field; OCT optical coherence tomography; IOP intraocular pressure; BRVO Branch retinal vein occlusion; CRVO central retinal vein occlusion; CRAO central retinal artery occlusion; BRAO branch retinal artery occlusion; RT retinal tear; SB scleral buckle; PPV pars plana vitrectomy; VH Vitreous hemorrhage; PRP panretinal laser photocoagulation; IVK intravitreal kenalog; VMT vitreomacular traction; MH Macular hole;  NVD neovascularization of the disc; NVE neovascularization elsewhere; AREDS age related eye disease study; ARMD age related macular degeneration; POAG primary open angle glaucoma; EBMD epithelial/anterior basement membrane dystrophy; ACIOL anterior chamber intraocular lens; IOL intraocular lens; PCIOL posterior chamber intraocular lens; Phaco/IOL phacoemulsification with intraocular lens placement; [place] photorefractive keratectomy; LASIK laser assisted in situ keratomileusis; HTN hypertension; DM diabetes mellitus; COPD chronic obstructive pulmonary disease

## 2018-12-12 ENCOUNTER — Ambulatory Visit (INDEPENDENT_AMBULATORY_CARE_PROVIDER_SITE_OTHER): Payer: Medicare Other | Admitting: Ophthalmology

## 2018-12-12 ENCOUNTER — Other Ambulatory Visit: Payer: Self-pay

## 2018-12-12 ENCOUNTER — Encounter (INDEPENDENT_AMBULATORY_CARE_PROVIDER_SITE_OTHER): Payer: Self-pay | Admitting: Ophthalmology

## 2018-12-12 DIAGNOSIS — H25811 Combined forms of age-related cataract, right eye: Secondary | ICD-10-CM

## 2018-12-12 DIAGNOSIS — H35371 Puckering of macula, right eye: Secondary | ICD-10-CM | POA: Diagnosis not present

## 2018-12-12 DIAGNOSIS — H40052 Ocular hypertension, left eye: Secondary | ICD-10-CM

## 2018-12-12 DIAGNOSIS — H3581 Retinal edema: Secondary | ICD-10-CM | POA: Diagnosis not present

## 2018-12-12 DIAGNOSIS — Z961 Presence of intraocular lens: Secondary | ICD-10-CM

## 2018-12-12 DIAGNOSIS — I10 Essential (primary) hypertension: Secondary | ICD-10-CM | POA: Diagnosis not present

## 2018-12-12 DIAGNOSIS — H59022 Cataract (lens) fragments in eye following cataract surgery, left eye: Secondary | ICD-10-CM

## 2018-12-12 DIAGNOSIS — H35033 Hypertensive retinopathy, bilateral: Secondary | ICD-10-CM

## 2018-12-12 MED ORDER — DORZOLAMIDE HCL-TIMOLOL MAL 2-0.5 % OP SOLN: 1.0000 [drp] | Freq: Three times a day (TID) | OPHTHALMIC | 10 refills | Status: DC

## 2018-12-12 MED ORDER — BRIMONIDINE TARTRATE 0.2 % OP SOLN: 1.0000 [drp] | Freq: Three times a day (TID) | OPHTHALMIC | 10 refills | Status: DC

## 2018-12-25 DIAGNOSIS — L821 Other seborrheic keratosis: Secondary | ICD-10-CM | POA: Diagnosis not present

## 2018-12-25 DIAGNOSIS — Z85828 Personal history of other malignant neoplasm of skin: Secondary | ICD-10-CM | POA: Diagnosis not present

## 2018-12-25 DIAGNOSIS — I781 Nevus, non-neoplastic: Secondary | ICD-10-CM | POA: Diagnosis not present

## 2018-12-25 DIAGNOSIS — B351 Tinea unguium: Secondary | ICD-10-CM | POA: Diagnosis not present

## 2018-12-25 DIAGNOSIS — L570 Actinic keratosis: Secondary | ICD-10-CM | POA: Diagnosis not present

## 2018-12-25 DIAGNOSIS — D2272 Melanocytic nevi of left lower limb, including hip: Secondary | ICD-10-CM | POA: Diagnosis not present

## 2018-12-25 NOTE — Progress Notes (Signed)
[facility] Note  12/26/2018     CHIEF COMPLAINT Patient presents for Retina Follow Up   HISTORY OF PRESENT ILLNESS: Ryan [name] is a 73 y.o. male who presents to the clinic today for:   HPI    Retina Follow Up    Patient presents with  Other (Retained lens material).  In left eye.  Severity is moderate.  Duration of 2 weeks.  Since onset it is gradually worsening.  I, the attending physician,  performed the HPI with the patient and updated documentation appropriately.          Comments    Patient states vision slightly worse, hazy OS. Floaters in vision obscure vision OS. "Dozens" of small floaters OS. No eye pain/discomfort. Still on gtts as prescribed by Dr. Coralyn [name].       Last edited by Ryan Caffey, MD on 12/26/2018  9:40 AM. (History)    pt states his vision and floaters are pretty much the same, he states he feels like the floaters are breaking up some so there are more, smaller ones there now, he states the black and green floaters are about the same size, but not as dark as before, he states his night vision is much darker in his left eye and takes longer to adjust to differences in light   Referring physician: [name], [place],  [place] [address]  HISTORICAL INFORMATION:   Selected notes from the MEDICAL RECORD NUMBER Referred by Dr. Rutherford [name] for concern of possible endophthalmitis OS LEE: 05.29.20 Ryan [name]) Ocular Hx-s/p cat sx 05.27.20 PMH-   CURRENT MEDICATIONS: Current Outpatient Medications (Ophthalmic Drugs)  Medication Sig  . brimonidine (ALPHAGAN) 0.2 % ophthalmic solution Place 1 drop into the left eye 3 (three) times daily.  . Difluprednate 0.05 % EMUL Place 1 drop into the left eye 6 (six) times daily.  . dorzolamide-timolol (COSOPT) 22.3-6.8 MG/ML ophthalmic solution Place 1 drop into the left eye 3 (three) times daily.  . ILEVRO 0.3 % ophthalmic suspension INSTILL 1 DROP INTO  LEFT EYE ONCE A DAY AS NEEDED  . ofloxacin (OCUFLOX) 0.3 % ophthalmic solution Place 1 drop into the left eye 4 (four) times daily.    No current facility-administered medications for this visit.  (Ophthalmic Drugs)   Current Outpatient Medications (Other)  Medication Sig  . acyclovir (ZOVIRAX) 400 MG tablet Take 1 tablet (400 mg total) by mouth daily.  Ryan [name] THYROID 60 MG tablet TAKE 1 TABLET DAILY  . Ascorbic Acid (VITAMIN C) 1000 MG tablet Take 1,000 mg by mouth daily.    Marland Kitchen atorvastatin (LIPITOR) 10 MG tablet Take 10 mg by mouth daily.  . cetirizine (ZYRTEC) 10 MG tablet Take 10 mg by mouth daily.  . Cholecalciferol (VITAMIN D) 1000 UNITS capsule Take 1,000 Units by mouth daily.    Marland Kitchen co-enzyme Q-10 30 MG capsule Take 100 mg by mouth 2 (two) times daily.   . Cyanocobalamin (VITAMIN B-12 CR) 1500 MCG TBCR Take by mouth.    . Digestive Enzymes (DIGESTIVE ENZYME PO) Take 1 tablet by mouth 3 (three) times daily after meals.  Marland Kitchen loratadine (CLARITIN) 10 MG tablet Take 10 mg by mouth daily.  . Magnesium Citrate 100 MG TABS Take 400 mg by mouth.  . tadalafil (CIALIS) 5 MG tablet Take 5 mg by mouth daily.  . vitamin E 400 UNIT capsule Take 400 Units by mouth daily.    . Zinc 50  MG CAPS Take by mouth.     No current facility-administered medications for this visit.  (Other)      REVIEW OF SYSTEMS: ROS    Positive for: Eyes   Negative for: Constitutional, Gastrointestinal, Neurological, Skin, Genitourinary, Musculoskeletal, HENT, Endocrine, Cardiovascular, Respiratory, Psychiatric, Allergic/Imm, Heme/Lymph   Last edited by Ryan [name] D on 12/26/2018  9:10 AM. (History)       ALLERGIES Allergies  Allergen Reactions  . Dust Mite Extract   . Grass Extracts [Gramineae Pollens]     PAST MEDICAL HISTORY Past Medical History:  Diagnosis Date  . Back pain   . Fungal nail infection   . Hyperlipidemia   . Hypertension    Past Surgical History:  Procedure Laterality Date  .  ankle surgery trauma    . CATARACT EXTRACTION Left 11/29/2018   Dr. Gershon [name]  . eye lid surgery    . EYE SURGERY    . fungal nail    . LASIK Bilateral     FAMILY HISTORY Family History  Problem Relation Age of Onset  . Heart disease Mother   . Liver disease Father   . Alcohol abuse Father   . Cancer Father        hepatic cancer ?primary  . Arthritis Sister   . COPD Sister     SOCIAL HISTORY Social History   Tobacco Use  . Smoking status: Former Smoker    Quit date: 07/05/1981    Years since quitting: 37.5  . Smokeless tobacco: Never Used  Substance Use Topics  . Alcohol use: Yes  . Drug use: No         OPHTHALMIC EXAM:  Base Eye Exam    Visual Acuity (Snellen - Linear)      Right Left   Dist [place] 20/20 -1 20/30   Dist ph [place]  20/20       Tonometry (Tonopen, 9:21 AM)      Right Left   Pressure 12 14       Pupils      Dark Light Shape React APD   Right 2 1 Round Brisk None   Left 3 2 Round Slow None       Visual Fields (Counting fingers)      Left Right    Full Full       Extraocular Movement      Right Left    Full, Ortho Full, Ortho       Neuro/Psych    Oriented x3: Yes   Mood/Affect: Normal       Dilation    Both eyes: 1.0% Mydriacyl, 2.5% Phenylephrine @ 9:21 AM        Slit Lamp and Fundus Exam    Slit Lamp Exam      Right Left   Lids/Lashes Dermatochalasis - upper lid, Meibomian gland dysfunction Dermatochalasis - upper lid, Meibomian gland dysfunction, Telangiectasia   Conjunctiva/Sclera Trace nasal Pinguecula mild nasal pterygeium    Cornea Arcus, 1+ Punctate epithelial erosions, abrely visible lasik flap 1+ Punctate epithelial erosions, trace Descemet's folds, nasal pterygeium, lasik flap in good position with epithelial in growth at 0430,    Anterior Chamber Deep and quiet Deep, 0.5+ Cell/pigment, no hypopyon, TID's from 4098-1191   Iris Round and dilated Round and moderately dilated to 5.63mm, prominent vessels -- improved   Lens  2+ Nuclear sclerosis, 1-2+ Cortical cataract 3 piece sulcus IOL in good position and stable, no pseudophakodenisis    Vitreous Vitreous syneresis mild Vitreous syneresis, +  cell/pigment, retained lens material inferior vitreous--mobile, cortical debris disintegrating and settling inferiorly       Fundus Exam      Right Left   Disc Pink and Sharp Pink and Sharp   C/D Ratio 0.2 0.3   Macula Flat, Blunted foveal reflex, Retinal pigment epithelial mottling, No heme or edema Flat, Blunted foveal reflex, mild RPE changes, No heme or edema   Vessels Mild Vascular attenuation Mild Vascular attenuation, mild AV crossing changes   Periphery Attached    limited vew due to poor dilation, but grossly attached without RT/RD, retianed lens material inferior vitreous--mobile, cortical debris disintegrating and settling inferiorly, vitreous condensations inferiorly, pigmented CR atrophy at 1030          IMAGING AND PROCEDURES  Imaging and Procedures for @TODAY @  OCT, Retina - OU - Both Eyes       Right Eye Quality was good. Central Foveal Thickness: 315. Progression has been stable. Findings include abnormal foveal contour, no IRF, no SRF, epiretinal membrane, macular pucker.   Left Eye Quality was good. Central Foveal Thickness: 302. Progression has improved. Findings include normal foveal contour, no IRF, no SRF (Mild Interval improvement in vitreous opacities).   Notes *Images captured and stored on drive  Diagnosis / Impression:   OD: mild ERM with macular pucker --stable OS: NFP, no IRF, no SRF; mild Interval improvement in vitreous opacities   Clinical management:  See below  Abbreviations: NFP - Normal foveal profile. CME - cystoid macular edema. PED - pigment epithelial detachment. IRF - intraretinal fluid. SRF - subretinal fluid. EZ - ellipsoid zone. ERM - epiretinal membrane. ORA - outer retinal atrophy. ORT - outer retinal tubulation. SRHM - subretinal hyper-reflective material                  ASSESSMENT/PLAN:    ICD-10-CM   1. Retained lens material following cataract surgery of left eye  H59.022   2. Ocular hypertension, left  H40.052   3. Epiretinal membrane (ERM) of right eye  H35.371   4. Retinal edema  H35.81 OCT, Retina - OU - Both Eyes  5. Essential hypertension  I10   6. Hypertensive retinopathy of both eyes  H35.033   7. Combined forms of age-related cataract of right eye  H25.811   8. Pseudophakia  Z96.1     1,2. Retained Lens Material with ocular hypertension OS  - complicated CE + sulcus IOL OS (5.27.20, Shapiro) -- posterior capsule rupture and anterior vitrectomy  - sulcus IOL in excellent position -- stable without pseudophakodonesis  - small cortical fragments in vitreous persist but are disintegrating, but small piece in inferior angle now resolved  - tMax 42 OS on POD2 in Dr. Kellie Moor office -- improved to 22 on Combigan  - today, BCVA stable at 20//20 and IOP stable at 14  - cont durezol 6x / day OS  - continue antibiotic QID OS until bottle runs out  - cont cosopt and brimonidine TID OS  - discussed possible need for PPV OS to remove cortical remnants if they fail to resolve medically, also discussed possible need for sutured IOL OS  - pt very bothered by floaters OS-patient wishes to proceed with PPV OS on January 25, 2019  - will [name] 25g PPV with possible IOL exchange OS under general anesthesia for 7.23.2020  - f/u 2 wks -- DFE/OCT/ IOL Master / surgical paper work  3,4. Epiretinal membrane, right eye  - mild ERM  - asymptomatic, no metamorphopsia  -  no indication for surgery at this time  - monitor for now  5,6. Hypertensive retinopathy OU  - discussed importance of tight BP control  - monitor  7. Mixed form age related cataract OD  - under the expert management of Dr. Gershon [name]  - surgery OD has been postponed due to issues OS  8. Pseudophakia OS  - s/p CE + sulcus IOL (5.27.20, Dr. Gershon [name])  - IOL in good  position  - retained lens material as above   Ophthalmic Meds Ordered this visit:  No orders of the defined types were placed in this encounter.      Return in about 2 weeks (around 01/09/2019) for f/u retained lens material, IOL calcs.  There are no Patient Instructions on file for this visit.   Explained the diagnoses, plan, and follow up with the patient and they expressed understanding.  Patient expressed understanding of the importance of proper follow up care.   This document serves as a record of services personally performed by Gardiner Sleeper, MD, PhD. It was created on their behalf by Ernest Mallick, OA, an ophthalmic assistant. The creation of this record is the provider's dictation and/or activities during the visit.    Electronically signed by: Ernest Mallick, OA  06.22.2020 10:49 AM    Gardiner Sleeper, M.D., Ph.D. Diseases & Surgery of the Retina and Vitreous [facility]  I have reviewed the above documentation for accuracy and completeness, and I agree with the above. Gardiner Sleeper, M.D., Ph.D. 12/26/18 10:49 AM    Abbreviations: M myopia (nearsighted); A astigmatism; H hyperopia (farsighted); P presbyopia; Mrx spectacle prescription;  CTL contact lenses; OD right eye; OS left eye; OU both eyes  XT exotropia; ET esotropia; PEK punctate epithelial keratitis; PEE punctate epithelial erosions; DES dry eye syndrome; MGD meibomian gland dysfunction; ATs artificial tears; PFAT's preservative free artificial tears; [place] nuclear sclerotic cataract; PSC posterior subcapsular cataract; ERM epi-retinal membrane; PVD posterior vitreous detachment; RD retinal detachment; DM diabetes mellitus; DR diabetic retinopathy; NPDR non-proliferative diabetic retinopathy; PDR proliferative diabetic retinopathy; CSME clinically significant macular edema; DME diabetic macular edema; dbh dot blot hemorrhages; CWS cotton wool spot; POAG primary open angle glaucoma; C/D cup-to-disc  ratio; HVF humphrey visual field; GVF goldmann visual field; OCT optical coherence tomography; IOP intraocular pressure; BRVO Branch retinal vein occlusion; CRVO central retinal vein occlusion; CRAO central retinal artery occlusion; BRAO branch retinal artery occlusion; RT retinal tear; SB scleral buckle; PPV pars plana vitrectomy; VH Vitreous hemorrhage; PRP panretinal laser photocoagulation; IVK intravitreal kenalog; VMT vitreomacular traction; MH Macular hole;  NVD neovascularization of the disc; NVE neovascularization elsewhere; AREDS age related eye disease study; ARMD age related macular degeneration; POAG primary open angle glaucoma; EBMD epithelial/anterior basement membrane dystrophy; ACIOL anterior chamber intraocular lens; IOL intraocular lens; PCIOL posterior chamber intraocular lens; Phaco/IOL phacoemulsification with intraocular lens placement; [place] photorefractive keratectomy; LASIK laser assisted in situ keratomileusis; HTN hypertension; DM diabetes mellitus; COPD chronic obstructive pulmonary disease

## 2018-12-26 ENCOUNTER — Other Ambulatory Visit: Payer: Self-pay

## 2018-12-26 ENCOUNTER — Ambulatory Visit (INDEPENDENT_AMBULATORY_CARE_PROVIDER_SITE_OTHER): Payer: Medicare Other | Admitting: Ophthalmology

## 2018-12-26 ENCOUNTER — Encounter (INDEPENDENT_AMBULATORY_CARE_PROVIDER_SITE_OTHER): Payer: Self-pay | Admitting: Ophthalmology

## 2018-12-26 DIAGNOSIS — H3581 Retinal edema: Secondary | ICD-10-CM

## 2018-12-26 DIAGNOSIS — H25811 Combined forms of age-related cataract, right eye: Secondary | ICD-10-CM | POA: Diagnosis not present

## 2018-12-26 DIAGNOSIS — H35371 Puckering of macula, right eye: Secondary | ICD-10-CM | POA: Diagnosis not present

## 2018-12-26 DIAGNOSIS — H40052 Ocular hypertension, left eye: Secondary | ICD-10-CM | POA: Diagnosis not present

## 2018-12-26 DIAGNOSIS — H35033 Hypertensive retinopathy, bilateral: Secondary | ICD-10-CM

## 2018-12-26 DIAGNOSIS — H59022 Cataract (lens) fragments in eye following cataract surgery, left eye: Secondary | ICD-10-CM

## 2018-12-26 DIAGNOSIS — I10 Essential (primary) hypertension: Secondary | ICD-10-CM | POA: Diagnosis not present

## 2018-12-26 DIAGNOSIS — Z961 Presence of intraocular lens: Secondary | ICD-10-CM | POA: Diagnosis not present

## 2019-01-11 NOTE — Progress Notes (Signed)
[facility] Note  01/12/2019     CHIEF COMPLAINT Patient presents for Retina Follow Up   HISTORY OF PRESENT ILLNESS: Ryan [name] is a 73 y.o. male who presents to the clinic today for:   HPI    Retina Follow Up    Diagnosis: Retained lens material OS.  In left eye.  Severity is moderate.  Duration of 2.5 weeks.  Since onset it is stable.  I, the attending physician,  performed the HPI with the patient and updated documentation appropriately.          Comments    Patient states vision the same OS. On durezol 6 times daily, cosopt tid, brimonidine tid OS.        Last edited by [name], MD on 01/12/2019 11:08 AM. (History)    pt states he is still seeing a lot of large floaters, he states he wants to cancel his sx on July 23 bc of the restrictions at [facility]   Referring physician: [name], MD [place],  [place] [address]  HISTORICAL INFORMATION:   Selected notes from the MEDICAL RECORD NUMBER Referred by Dr. Rutherford Guys for concern of possible endophthalmitis OS LEE: 05.29.20 [name]) Ocular Hx-s/p cat sx 05.27.20 PM[facility]-   CURRENT MEDICATIONS: Current Outpatient Medications (Ophthalmic Drugs)  Medication Sig  . brimonidine (ALPHAGAN) 0.2 % ophthalmic solution Place 1 drop into the left eye 3 (three) times daily.  . Difluprednate 0.05 % EMUL Place 1 drop into the left eye 6 (six) times daily.  . dorzolamide-timolol (COSOPT) 22.3-6.8 MG/ML ophthalmic solution Place 1 drop into the left eye 3 (three) times daily.  . ILEVRO 0.3 % ophthalmic suspension INSTILL 1 DROP INTO LEFT EYE ONCE A DAY AS NEEDED  . ofloxacin (OCUFLOX) 0.3 % ophthalmic solution Place 1 drop into the left eye 4 (four) times daily.    No current facility-administered medications for this visit.  (Ophthalmic Drugs)   Current Outpatient Medications (Other)  Medication Sig  . acyclovir (ZOVIRAX) 400 MG tablet Take 1 tablet (400 mg total)  by mouth daily.  [name] THYROID 60 MG tablet TAKE 1 TABLET DAILY  . Ascorbic Acid (VITAMIN C) 1000 MG tablet Take 1,000 mg by mouth daily.    Marland Kitchen atorvastatin (LIPITOR) 10 MG tablet Take 10 mg by mouth daily.  . cetirizine (ZYRTEC) 10 MG tablet Take 10 mg by mouth daily.  . Cholecalciferol (VITAMIN D) 1000 UNITS capsule Take 1,000 Units by mouth daily.    Marland Kitchen co-enzyme Q-10 30 MG capsule Take 100 mg by mouth 2 (two) times daily.   . Cyanocobalamin (VITAMIN B-12 CR) 1500 MCG TBCR Take by mouth.    . Digestive Enzymes (DIGESTIVE ENZYME PO) Take 1 tablet by mouth 3 (three) times daily after meals.  Marland Kitchen loratadine (CLARITIN) 10 MG tablet Take 10 mg by mouth daily.  . Magnesium Citrate 100 MG TABS Take 400 mg by mouth.  . tadalafil (CIALIS) 5 MG tablet Take 5 mg by mouth daily.  . vitamin E 400 UNIT capsule Take 400 Units by mouth daily.    . Zinc 50 MG CAPS Take by mouth.     No current facility-administered medications for this visit.  (Other)      REVIEW OF SYSTEMS: ROS    Positive for: Eyes   Negative for: Constitutional, Gastrointestinal, Neurological, Skin, Genitourinary, Musculoskeletal, HENT, Endocrine, Cardiovascular, Respiratory, Psychiatric, Allergic/Imm, Heme/Lymph   Last edited by Jobe Marker  on 01/12/2019 10:00 AM. (History)       ALLERGIES Allergies  Allergen Reactions  . Dust Mite Extract   . Grass Extracts [Gramineae Pollens]     PAST MEDICAL HISTORY Past Medical History:  Diagnosis Date  . Back pain   . Fungal nail infection   . Hyperlipidemia   . Hypertension    Past Surgical History:  Procedure Laterality Date  . ankle surgery trauma    . CATARACT EXTRACTION Left 11/29/2018   Dr. [name]  . eye lid surgery    . EYE SURGERY    . fungal nail    . LASIK Bilateral     FAMILY HISTORY Family History  Problem Relation Age of Onset  . Heart disease Mother   . Liver disease Father   . Alcohol abuse Father   . Cancer Father        hepatic cancer  ?primary  . Arthritis Sister   . COPD Sister     SOCIAL HISTORY Social History   Tobacco Use  . Smoking status: Former Smoker    Quit date: 07/05/1981    Years since quitting: 37.5  . Smokeless tobacco: Never Used  Substance Use Topics  . Alcohol use: Yes  . Drug use: No         OPHTHALMIC EXAM:  Base Eye Exam    Visual Acuity (Snellen - Linear)      Right Left   Dist [place] 20/20 -1 20/25   Dist ph [place]  20/20 -1       Tonometry (Tonopen, 10:13 AM)      Right Left   Pressure 14 15       Pupils      Dark Light Shape React APD   Right 3 2 Round Slow None   Left 4 3 Round Slow None       Visual Fields (Counting fingers)      Left Right    Full Full       Extraocular Movement      Right Left    Full, Ortho Full, Ortho       Neuro/Psych    Oriented x3: Yes   Mood/Affect: Normal       Dilation    Both eyes: 1.0% Mydriacyl, 2.5% Phenylephrine @ 10:13 AM        Slit Lamp and Fundus Exam    Slit Lamp Exam      Right Left   Lids/Lashes Dermatochalasis - upper lid, Meibomian gland dysfunction Dermatochalasis - upper lid, Meibomian gland dysfunction, Telangiectasia   Conjunctiva/Sclera Trace nasal Pinguecula mild nasal pterygeium    Cornea Arcus, 1+ Punctate epithelial erosions, abrely visible lasik flap 1-2+ Punctate epithelial erosions, nasal pterygeium, lasik flap in good position with epithelial in growth at 0430,    Anterior Chamber Deep and quiet Deep, 1+ Cell/pigment, no hypopyon, TID's from [id number]   Iris Round and dilated Round and moderately dilated to 5.48mm, prominent vessels -- improved   Lens 2+ Nuclear sclerosis, 1-2+ Cortical cataract 3 piece sulcus IOL in good position and stable, no pseudophakodenisis    Vitreous Vitreous syneresis mild Vitreous syneresis, +cell/pigment, retained lens material inferior vitreous--mobile, cortical debris disintegrating and settling inferiorly       Fundus Exam      Right Left   Disc Pink and Sharp Pink and  Sharp   C/D Ratio 0.2 0.2   Macula Flat, Blunted foveal reflex, Retinal pigment epithelial mottling, No heme or edema Flat, Blunted foveal reflex,  mild RPE changes, No heme or edema   Vessels Mild Vascular attenuation Mild Vascular attenuation, mild AV crossing changes   Periphery Attached    limited vew due to poor dilation, but grossly attached without RT/RD, retianed lens material inferior vitreous--mobile, cortical debris disintegrating and settling inferiorly, vitreous condensations inferiorly, pigmented CR atrophy at 1030        Refraction    Manifest Refraction      Sphere Cylinder Axis Dist VA   Right       Left [place] +1.75 125 20/20-2          IMAGING AND PROCEDURES  Imaging and Procedures for @TODAY @  OCT, Retina - OU - Both Eyes       Right Eye Quality was good. Central Foveal Thickness: 323. Progression has been stable. Findings include abnormal foveal contour, no IRF, no SRF, epiretinal membrane, macular pucker (Interval distortion of foveal profile).   Left Eye Quality was good. Central Foveal Thickness: 301. Progression has been stable. Findings include normal foveal contour, no IRF, no SRF (persistent vitreous opacities).   Notes *Images captured and stored on drive  Diagnosis / Impression:   OD: mild ERM with macular pucker --Interval distortion of foveal profile OS: NFP, no IRF, no SRF; persistent vitreous opacities   Clinical management:  See below  Abbreviations: NFP - Normal foveal profile. CME - cystoid macular edema. PED - pigment epithelial detachment. IRF - intraretinal fluid. SRF - subretinal fluid. EZ - ellipsoid zone. ERM - epiretinal membrane. ORA - outer retinal atrophy. ORT - outer retinal tubulation. SRHM - subretinal hyper-reflective material        Color Fundus Photography Optos - OU - Both Eyes       Right Eye Progression has no prior data. Disc findings include normal observations. Macula : normal observations. Vessels :  attenuated. Periphery : normal observations.   Left Eye Progression has no prior data. Disc findings include normal observations. Macula : normal observations. Vessels : attenuated. Periphery : (Retained cortical lens fragments disintegrating; persistent pigmented debris inferiorly).   Notes **Images stored on drive**  Impression: OD: normal OS: retained cortical lens fragments disintegrating; persistent pigmented debris within inferior vitreous        IOL Biometry - OU - Both Eyes     Component Value Flag Ref Range Units Status   A LENGTH (OS) 25.14       mm Final   A LENGTH (OD) 24.97       mm Final          Right Eye Axial lenght was 24.97 mm.   Left Eye Target refraction: plano--0.5. Axial lenght was 25.14 mm.                 ASSESSMENT/PLAN:    ICD-10-CM   1. Retained lens material following cataract surgery of left eye  H59.022 Color Fundus Photography Optos - OU - Both Eyes  2. Ocular hypertension, left  H40.052   3. Epiretinal membrane (ERM) of right eye  H35.371   4. Retinal edema  H35.81 OCT, Retina - OU - Both Eyes  5. Essential hypertension  I10   6. Hypertensive retinopathy of both eyes  H35.033   7. Combined forms of age-related cataract of right eye  H25.811 IOL Biometry - OU - Both Eyes  8. Pseudophakia  Z96.1 IOL Biometry - OU - Both Eyes    1,2. Retained Lens Material with ocular hypertension OS  - complicated CE + sulcus IOL OS (5.27.20,  [name]) -- posterior capsule rupture and anterior vitrectomy  - sulcus IOL in excellent position -- stable without pseudophakodonesis  - small cortical fragments in vitreous persist but are disintegrating, but small piece in inferior angle resolved  - tMax 42 OS on POD2 in Dr. Kellie Moor office -- improved to 22 on [place]  - today, BCVA stable at 20//20 and IOP stable at 15  - cont durezol 6x / day OS  - cont cosopt and brimonidine TID OS  - add tropicamide BID OS  - surgery scheduled for January 25, 2019  at 11:30AM, [facility] OR 8   - IOL Master measurements obtained today for back-up sutured [place], if needed  - pt wishes to cancel surgery due to restrictions at [facility] -- pt and wife unsure of when they will consider rescheduling  - f/u 3 weeks  3,4. Epiretinal membrane, right eye  - mild ERM, but OCT shows interval distortion of foveal profile  - asymptomatic, no metamorphopsia  - no indication for surgery at this time  - monitor for now  5,6. Hypertensive retinopathy OU  - discussed importance of tight BP control  - monitor  7. Mixed form age related cataract OD  - under the expert management of Dr. [name]  - surgery OD has been postponed due to issues OS  8. Pseudophakia OS  - s/p CE + sulcus IOL (5.27.20, Dr. [name])  - IOL in good position  - retained lens material as above   Ophthalmic Meds Ordered this visit:  No orders of the defined types were placed in this encounter.      Return in about 3 weeks (around 02/02/2019) for f/u retained lens material OS, DFE, OCT, OPTOS.  There are no Patient Instructions on file for this visit.   Explained the diagnoses, plan, and follow up with the patient and they expressed understanding.  Patient expressed understanding of the importance of proper follow up care.   This document serves as a record of services personally performed by Gardiner Sleeper, MD, PhD. It was created on their behalf by Ernest Mallick, OA, an ophthalmic assistant. The creation of this record is the provider's dictation and/or activities during the visit.    Electronically signed by: Ernest Mallick, OA  07.09.2020 4:37 PM     Gardiner Sleeper, M.D., Ph.D. Diseases & Surgery of the Retina and Vitreous [facility]  I have reviewed the above documentation for accuracy and completeness, and I agree with the above. Gardiner Sleeper, M.D., Ph.D. 01/12/19 4:38 PM     Abbreviations: M myopia (nearsighted); A astigmatism; H hyperopia  (farsighted); P presbyopia; Mrx spectacle prescription;  CTL contact lenses; OD right eye; OS left eye; OU both eyes  XT exotropia; ET esotropia; PEK punctate epithelial keratitis; PEE punctate epithelial erosions; DES dry eye syndrome; MGD meibomian gland dysfunction; ATs artificial tears; PFAT's preservative free artificial tears; [place] nuclear sclerotic cataract; PSC posterior subcapsular cataract; ERM epi-retinal membrane; PVD posterior vitreous detachment; RD retinal detachment; DM diabetes mellitus; DR diabetic retinopathy; NPDR non-proliferative diabetic retinopathy; PDR proliferative diabetic retinopathy; CSME clinically significant macular edema; DME diabetic macular edema; dbh dot blot hemorrhages; CWS cotton wool spot; POAG primary open angle glaucoma; C/D cup-to-disc ratio; HVF humphrey visual field; GVF goldmann visual field; OCT optical coherence tomography; IOP intraocular pressure; BRVO Branch retinal vein occlusion; CRVO central retinal vein occlusion; CRAO central retinal artery occlusion; BRAO branch retinal artery occlusion; RT retinal tear; SB scleral buckle; PPV pars plana  vitrectomy; VH Vitreous hemorrhage; PRP panretinal laser photocoagulation; IVK intravitreal kenalog; VMT vitreomacular traction; MH Macular hole;  NVD neovascularization of the disc; NVE neovascularization elsewhere; AREDS age related eye disease study; ARMD age related macular degeneration; POAG primary open angle glaucoma; EBMD epithelial/anterior basement membrane dystrophy; ACIOL anterior chamber intraocular lens; IOL intraocular lens; PCIOL posterior chamber intraocular lens; Phaco/IOL phacoemulsification with intraocular lens placement; [place] photorefractive keratectomy; LASIK laser assisted in situ keratomileusis; HTN hypertension; DM diabetes mellitus; COPD chronic obstructive pulmonary disease

## 2019-01-12 ENCOUNTER — Ambulatory Visit (INDEPENDENT_AMBULATORY_CARE_PROVIDER_SITE_OTHER): Payer: Medicare Other | Admitting: Ophthalmology

## 2019-01-12 ENCOUNTER — Other Ambulatory Visit: Payer: Self-pay

## 2019-01-12 ENCOUNTER — Encounter (INDEPENDENT_AMBULATORY_CARE_PROVIDER_SITE_OTHER): Payer: Self-pay | Admitting: Ophthalmology

## 2019-01-12 DIAGNOSIS — Z961 Presence of intraocular lens: Secondary | ICD-10-CM | POA: Diagnosis not present

## 2019-01-12 DIAGNOSIS — H35033 Hypertensive retinopathy, bilateral: Secondary | ICD-10-CM | POA: Diagnosis not present

## 2019-01-12 DIAGNOSIS — H59022 Cataract (lens) fragments in eye following cataract surgery, left eye: Secondary | ICD-10-CM | POA: Diagnosis not present

## 2019-01-12 DIAGNOSIS — I10 Essential (primary) hypertension: Secondary | ICD-10-CM | POA: Diagnosis not present

## 2019-01-12 DIAGNOSIS — H3581 Retinal edema: Secondary | ICD-10-CM

## 2019-01-12 DIAGNOSIS — H40052 Ocular hypertension, left eye: Secondary | ICD-10-CM

## 2019-01-12 DIAGNOSIS — H35371 Puckering of macula, right eye: Secondary | ICD-10-CM

## 2019-01-12 DIAGNOSIS — H25811 Combined forms of age-related cataract, right eye: Secondary | ICD-10-CM

## 2019-01-12 LAB — IOL BIOMETRY - OU - BOTH EYES
A LENGTH (OD): 24.97 mm
A LENGTH (OS): 25.14 mm

## 2019-01-22 ENCOUNTER — Other Ambulatory Visit (HOSPITAL_COMMUNITY): Payer: Medicare Other

## 2019-01-25 ENCOUNTER — Ambulatory Visit (HOSPITAL_COMMUNITY): Admission: RE | Admit: 2019-01-25 | Payer: Medicare Other | Source: Home / Self Care | Admitting: Ophthalmology

## 2019-01-25 ENCOUNTER — Encounter (HOSPITAL_COMMUNITY): Admission: RE | Payer: Self-pay | Source: Home / Self Care

## 2019-01-25 SURGERY — PARS PLANA VITRECTOMY WITH 25G REMOVAL/SUTURE INTRAOCULAR LENS
Anesthesia: General | Laterality: Left

## 2019-02-05 ENCOUNTER — Other Ambulatory Visit (INDEPENDENT_AMBULATORY_CARE_PROVIDER_SITE_OTHER): Payer: Self-pay | Admitting: Ophthalmology

## 2019-02-06 NOTE — Progress Notes (Signed)
[facility] Note  02/07/2019     CHIEF COMPLAINT Patient presents for Post-op Follow-up   HISTORY OF PRESENT ILLNESS: Ryan [name] is a 73 y.o. male who presents to the clinic today for:   HPI    Post-op Follow-up    In left eye.  Discomfort includes none.  Vision is stable.  I, the attending physician,  performed the HPI with the patient and updated documentation appropriately.          Comments    Tropicamide BID OS Brimonidine TID OS Dorzolamide TID OS Durezol 6x/day OS **Patient having issues with pharmacy on getting 10ML bottle of Durezol filled**  Patient states his vision is stable.  He complains of double vision with images OS.  Patient states it is not two separate images but more of a shadowing of images.  Patient denies eye pain or discomfort and denies any new or worsening floaters or flashes of light.       Last edited by Ryan [name] on 02/07/2019 10:29 AM. (History)    pt states his dark green floaters are getting lighter in color and are more spread out now, and he still has the smaller floaters as well, pt is ready to proceed with sx on August 27th   Referring physician: [name], [name],  [place] [address]  HISTORICAL INFORMATION:   Selected notes from the MEDICAL RECORD NUMBER Referred by Dr. Rutherford Guys for concern of possible endophthalmitis OS LEE: 05.29.20 Ryan [name]) Ocular Hx-s/p cat sx 05.27.20 PMH-   CURRENT MEDICATIONS: Current Outpatient Medications (Ophthalmic Drugs)  Medication Sig  . brimonidine (ALPHAGAN) 0.2 % ophthalmic solution Place 1 drop into the left eye 3 (three) times daily.  . Difluprednate (DUREZOL) 0.05 % EMUL Place 1 drop into the left eye 4 (four) times daily.  . dorzolamide-timolol (COSOPT) 22.3-6.8 MG/ML ophthalmic solution Place 1 drop into the left eye 3 (three) times daily.  . ILEVRO 0.3 % ophthalmic suspension INSTILL 1 DROP INTO LEFT EYE ONCE  A DAY AS NEEDED  . ofloxacin (OCUFLOX) 0.3 % ophthalmic solution Place 1 drop into the left eye 4 (four) times daily.    No current facility-administered medications for this visit.  (Ophthalmic Drugs)   Current Outpatient Medications (Other)  Medication Sig  . acyclovir (ZOVIRAX) 400 MG tablet Take 1 tablet (400 mg total) by mouth daily.  [name] THYROID 60 MG tablet TAKE 1 TABLET DAILY  . Ascorbic Acid (VITAMIN C) 1000 MG tablet Take 1,000 mg by mouth daily.    Marland Kitchen atorvastatin (LIPITOR) 10 MG tablet Take 10 mg by mouth daily.  . cetirizine (ZYRTEC) 10 MG tablet Take 10 mg by mouth daily.  . Cholecalciferol (VITAMIN D) 1000 UNITS capsule Take 1,000 Units by mouth daily.    Marland Kitchen co-enzyme Q-10 30 MG capsule Take 100 mg by mouth 2 (two) times daily.   . Cyanocobalamin (VITAMIN B-12 CR) 1500 MCG TBCR Take by mouth.    . Digestive Enzymes (DIGESTIVE ENZYME PO) Take 1 tablet by mouth 3 (three) times daily after meals.  Marland Kitchen loratadine (CLARITIN) 10 MG tablet Take 10 mg by mouth daily.  . Magnesium Citrate 100 MG TABS Take 400 mg by mouth.  . tadalafil (CIALIS) 5 MG tablet Take 5 mg by mouth daily.  . vitamin E 400 UNIT capsule Take 400 Units by mouth daily.    . Zinc 50 MG CAPS Take by mouth.  No current facility-administered medications for this visit.  (Other)      REVIEW OF SYSTEMS: ROS    Positive for: Eyes   Negative for: Constitutional, Gastrointestinal, Neurological, Skin, Genitourinary, Musculoskeletal, HENT, Endocrine, Cardiovascular, Respiratory, Psychiatric, Allergic/Imm, Heme/Lymph   Last edited by Ryan [name] on 02/07/2019  9:56 AM. (History)       ALLERGIES Allergies  Allergen Reactions  . Dust Mite Extract   . Grass Extracts [Gramineae Pollens]     PAST MEDICAL HISTORY Past Medical History:  Diagnosis Date  . Back pain   . Fungal nail infection   . Hyperlipidemia   . Hypertension    Past Surgical History:  Procedure Laterality Date  . ankle surgery  trauma    . CATARACT EXTRACTION Left 11/29/2018   Dr. Gershon [name]  . eye lid surgery    . EYE SURGERY    . fungal nail    . LASIK Bilateral     FAMILY HISTORY Family History  Problem Relation Age of Onset  . Heart disease Mother   . Liver disease Father   . Alcohol abuse Father   . Cancer Father        hepatic cancer ?primary  . Arthritis Sister   . COPD Sister     SOCIAL HISTORY Social History   Tobacco Use  . Smoking status: Former Smoker    Quit date: 07/05/1981    Years since quitting: 37.6  . Smokeless tobacco: Never Used  Substance Use Topics  . Alcohol use: Yes  . Drug use: No         OPHTHALMIC EXAM:  Base Eye Exam    Visual Acuity (Snellen - Linear)      Right Left   Dist [place] 20/20 -2 20/25 -2   Dist ph [place]  20/20 -2       Tonometry (Tonopen, 10:04 AM)      Right Left   Pressure 15 21       Tonometry #2 (Tonopen, 10:52 AM)      Right Left   Pressure 16 19       Pupils      Dark Light Shape React APD   Right 3 2 Round Brisk 0   Left 3 2 Round Brisk 0       Visual Fields      Left Right    Full Full       Extraocular Movement      Right Left    Full Full       Neuro/Psych    Oriented x3: Yes   Mood/Affect: Normal       Dilation    Both eyes: 1.0% Mydriacyl, 2.5% Phenylephrine @ 10:04 AM        Slit Lamp and Fundus Exam    Slit Lamp Exam      Right Left   Lids/Lashes Dermatochalasis - upper lid, Meibomian gland dysfunction Dermatochalasis - upper lid, Meibomian gland dysfunction, Telangiectasia   Conjunctiva/Sclera Trace nasal Pinguecula mild nasal pterygeium    Cornea Arcus, 1+ Punctate epithelial erosions, abrely visible lasik flap 2+ Punctate epithelial erosions, nasal pterygeium, lasik flap in good position with epithelial in growth at 0430,    Anterior Chamber Deep and quiet Deep, 0.5+ Cell/pigment, no hypopyon   Iris Round and dilated Round and moderately dilated to 5.4mm, mild TIDs at 0730-0800   Lens 2+ Nuclear  sclerosis, 1-2+ Cortical cataract 3 piece sulcus IOL in good position and stable, no pseudophakodenisis; mild capsular phimosis  and fibrosis   Vitreous Vitreous syneresis mild Vitreous syneresis, +cell/pigment, retained lens material inferior vitreous--mobile, cortical remnants disintegrating and settling inferiorly       Fundus Exam      Right Left   Disc Pink and Sharp Pink and Sharp   C/D Ratio 0.2 0.2   Macula Flat, Blunted foveal reflex, Retinal pigment epithelial mottling, No heme or edema Flat, Blunted foveal reflex, mild RPE changes, No heme or edema   Vessels Mild Vascular attenuation Mild Vascular attenuation, mild AV crossing changes   Periphery Attached    limited vew due to poor dilation, but grossly attached without RT/RD, retained lens material inferior vitreous--mobile, cortical debris disintegrating and settling inferiorly, vitreous condensations inferiorly, pigmented CR atrophy at 1030        Refraction    Manifest Refraction      Sphere Cylinder Axis Dist VA   Right       Left +0.00 +1.00 105 20/20-2          IMAGING AND PROCEDURES  Imaging and Procedures for @TODAY @  OCT, Retina - OU - Both Eyes       Right Eye Quality was good. Central Foveal Thickness: 313. Progression has been stable. Findings include abnormal foveal contour, no IRF, no SRF, epiretinal membrane, macular pucker (Interval improvement in foveal profile).   Left Eye Quality was good. Central Foveal Thickness: 296. Progression has been stable. Findings include normal foveal contour, no IRF, no SRF (Interval improvement in vitreous opacities).   Notes *Images captured and stored on drive  Diagnosis / Impression:   OD: mild ERM with macular pucker --Interval improvement in foveal profile OS: NFP, no IRF, no SRF; interval improvement in vitreous opacities   Clinical management:  See below  Abbreviations: NFP - Normal foveal profile. CME - cystoid macular edema. PED - pigment epithelial  detachment. IRF - intraretinal fluid. SRF - subretinal fluid. EZ - ellipsoid zone. ERM - epiretinal membrane. ORA - outer retinal atrophy. ORT - outer retinal tubulation. SRHM - subretinal hyper-reflective material                 ASSESSMENT/PLAN:    ICD-10-CM   1. Retained lens material following cataract surgery of left eye  H59.022   2. Ocular hypertension, left  H40.052   3. Epiretinal membrane (ERM) of right eye  H35.371   4. Retinal edema  H35.81 OCT, Retina - OU - Both Eyes  5. Essential hypertension  I10   6. Hypertensive retinopathy of both eyes  H35.033   7. Combined forms of age-related cataract of right eye  H25.811   8. Pseudophakia  Z96.1     1,2. Retained Lens Material with ocular hypertension OS  - complicated CE + sulcus IOL OS (5.27.20, Shapiro) -- posterior capsule rupture and anterior vitrectomy  - sulcus IOL in excellent position -- stable without pseudophakodonesis; +capsular phimosis/fibrosis  - small cortical fragments in vitreous persist but are disintegrating; small piece in inferior angle resolved  - tMax 42 OS on POD2 in Dr. Kellie Moor office -- improved to 22 on [place]  - today, BCVA stable at 20//20 and IOP okay at 21  - decrease durezol to QID OS  - cont cosopt and brimonidine TID OS  - cont tropicamide BID OS  - discussed findings, prognosis and treatment options  - recommend 25g PPV for removal of retained lens material OS under general anesthesia  - IOL master measurements obtained 7.10.20 for possible IOL exchange should the sulcus IOL  become displaced  - pt wishes to proceed with surgery on March 01, 2019 at 1130AM, [facility]  - RBA of procedure discussed, questions answered  - informed consent obtained and signed  - pt will need pre-op clearance from PCP and COVID testing the week of surgery  - f/u August 28th for POV1  3,4. Epiretinal membrane, right eye  - mild ERM  - asymptomatic, no metamorphopsia  - no indication for surgery at  this time  - monitor  5,6. Hypertensive retinopathy OU  - discussed importance of tight BP control  - monitor  7. Mixed form age related cataract OD  - under the expert management of Dr. Gershon [name]  - surgery OD has been postponed due to issues OS  8. Pseudophakia OS  - s/p CE + sulcus IOL (5.27.20, Dr. Gershon [name])  - IOL in good position  - retained lens material as above   Ophthalmic Meds Ordered this visit:  No orders of the defined types were placed in this encounter.      Return for August 28, POV.  There are no Patient Instructions on file for this visit.   Explained the diagnoses, plan, and follow up with the patient and they expressed understanding.  Patient expressed understanding of the importance of proper follow up care.   This document serves as a record of services personally performed by Gardiner Sleeper, MD, PhD. It was created on their behalf by Ernest Mallick, OA, an ophthalmic assistant. The creation of this record is the provider's dictation and/or activities during the visit.    Electronically signed by: Ernest Mallick, OA  08.04.2020 11:43 AM    Gardiner Sleeper, M.D., Ph.D. Diseases & Surgery of the Retina and Vitreous [facility]  I have reviewed the above documentation for accuracy and completeness, and I agree with the above. Gardiner Sleeper, M.D., Ph.D. 02/07/19 11:47 AM     Abbreviations: M myopia (nearsighted); A astigmatism; H hyperopia (farsighted); P presbyopia; Mrx spectacle prescription;  CTL contact lenses; OD right eye; OS left eye; OU both eyes  XT exotropia; ET esotropia; PEK punctate epithelial keratitis; PEE punctate epithelial erosions; DES dry eye syndrome; MGD meibomian gland dysfunction; ATs artificial tears; PFAT's preservative free artificial tears; [place] nuclear sclerotic cataract; PSC posterior subcapsular cataract; ERM epi-retinal membrane; PVD posterior vitreous detachment; RD retinal detachment; DM diabetes  mellitus; DR diabetic retinopathy; NPDR non-proliferative diabetic retinopathy; PDR proliferative diabetic retinopathy; CSME clinically significant macular edema; DME diabetic macular edema; dbh dot blot hemorrhages; CWS cotton wool spot; POAG primary open angle glaucoma; C/D cup-to-disc ratio; HVF humphrey visual field; GVF goldmann visual field; OCT optical coherence tomography; IOP intraocular pressure; BRVO Branch retinal vein occlusion; CRVO central retinal vein occlusion; CRAO central retinal artery occlusion; BRAO branch retinal artery occlusion; RT retinal tear; SB scleral buckle; PPV pars plana vitrectomy; VH Vitreous hemorrhage; PRP panretinal laser photocoagulation; IVK intravitreal kenalog; VMT vitreomacular traction; MH Macular hole;  NVD neovascularization of the disc; NVE neovascularization elsewhere; AREDS age related eye disease study; ARMD age related macular degeneration; POAG primary open angle glaucoma; EBMD epithelial/anterior basement membrane dystrophy; ACIOL anterior chamber intraocular lens; IOL intraocular lens; PCIOL posterior chamber intraocular lens; Phaco/IOL phacoemulsification with intraocular lens placement; [place] photorefractive keratectomy; LASIK laser assisted in situ keratomileusis; HTN hypertension; DM diabetes mellitus; COPD chronic obstructive pulmonary disease

## 2019-02-07 ENCOUNTER — Encounter (INDEPENDENT_AMBULATORY_CARE_PROVIDER_SITE_OTHER): Payer: Self-pay | Admitting: Ophthalmology

## 2019-02-07 ENCOUNTER — Ambulatory Visit (INDEPENDENT_AMBULATORY_CARE_PROVIDER_SITE_OTHER): Payer: Medicare Other | Admitting: Ophthalmology

## 2019-02-07 ENCOUNTER — Other Ambulatory Visit: Payer: Self-pay

## 2019-02-07 DIAGNOSIS — H3581 Retinal edema: Secondary | ICD-10-CM

## 2019-02-07 DIAGNOSIS — H35371 Puckering of macula, right eye: Secondary | ICD-10-CM | POA: Diagnosis not present

## 2019-02-07 DIAGNOSIS — Z961 Presence of intraocular lens: Secondary | ICD-10-CM

## 2019-02-07 DIAGNOSIS — H59022 Cataract (lens) fragments in eye following cataract surgery, left eye: Secondary | ICD-10-CM | POA: Diagnosis not present

## 2019-02-07 DIAGNOSIS — I10 Essential (primary) hypertension: Secondary | ICD-10-CM

## 2019-02-07 DIAGNOSIS — H40052 Ocular hypertension, left eye: Secondary | ICD-10-CM | POA: Diagnosis not present

## 2019-02-07 DIAGNOSIS — H25811 Combined forms of age-related cataract, right eye: Secondary | ICD-10-CM

## 2019-02-07 DIAGNOSIS — H35033 Hypertensive retinopathy, bilateral: Secondary | ICD-10-CM | POA: Diagnosis not present

## 2019-02-13 DIAGNOSIS — C61 Malignant neoplasm of prostate: Secondary | ICD-10-CM | POA: Diagnosis not present

## 2019-02-14 ENCOUNTER — Other Ambulatory Visit: Payer: Self-pay

## 2019-02-14 ENCOUNTER — Ambulatory Visit
Admission: RE | Admit: 2019-02-14 | Discharge: 2019-02-14 | Disposition: A | Discharge status: Home / Self Care | Payer: Medicare Other | Source: Ambulatory Visit | Attending: Urology | Admitting: Urology

## 2019-02-14 ENCOUNTER — Other Ambulatory Visit: Payer: Medicare Other

## 2019-02-14 DIAGNOSIS — C61 Malignant neoplasm of prostate: Secondary | ICD-10-CM | POA: Diagnosis not present

## 2019-02-14 MED ORDER — GADOBENATE DIMEGLUMINE 529 MG/ML IV SOLN
15.0000 mL | Freq: Once | INTRAVENOUS | Status: AC | PRN
  Administered 2019-02-14: 13:00:00 15 mL via INTRAVENOUS

## 2019-02-21 DIAGNOSIS — C61 Malignant neoplasm of prostate: Secondary | ICD-10-CM | POA: Diagnosis not present

## 2019-02-21 DIAGNOSIS — D075 Carcinoma in situ of prostate: Secondary | ICD-10-CM | POA: Diagnosis not present

## 2019-02-26 ENCOUNTER — Other Ambulatory Visit (HOSPITAL_COMMUNITY)
Admission: RE | Admit: 2019-02-26 | Discharge: 2019-02-26 | Disposition: A | Discharge status: Home / Self Care | Payer: Medicare Other | Source: Ambulatory Visit | Attending: Ophthalmology | Admitting: Ophthalmology

## 2019-02-26 DIAGNOSIS — Z01812 Encounter for preprocedural laboratory examination: Secondary | ICD-10-CM | POA: Insufficient documentation

## 2019-02-26 DIAGNOSIS — Z20828 Contact with and (suspected) exposure to other viral communicable diseases: Secondary | ICD-10-CM | POA: Insufficient documentation

## 2019-02-26 LAB — SARS CORONAVIRUS 2 (TAT 6-24 HRS): SARS Coronavirus 2: NEGATIVE

## 2019-02-26 NOTE — H&P (Signed)
Ryan [name] is an 73 y.o. male.    Chief Complaint: retained lens material, left eye  HPI: Pt underwent complicated cataract extraction w/ sulcus IOL placement on 5.27.2020. The posterior capsule was compromised and nuclear and cortical lens fragments fell back into the vitreous causing symptomatic floaters OS. After multiple discussions of the risks, benefits and alternative therapies, the patient elects to proceed with 25g PPV with removal retained lens fragments in the left eye under general anesthesia.   Past Medical History:  Diagnosis Date  . Back pain   . Fungal nail infection   . Hyperlipidemia   . Hypertension     Past Surgical History:  Procedure Laterality Date  . ankle surgery trauma    . CATARACT EXTRACTION Left 11/29/2018   Dr. [name]  . eye lid surgery    . EYE SURGERY    . fungal nail    . LASIK Bilateral     Family History  Problem Relation Age of Onset  . Heart disease Mother   . Liver disease Father   . Alcohol abuse Father   . Cancer Father        hepatic cancer ?primary  . Arthritis Sister   . COPD Sister    Social History:  reports that he quit smoking about 37 years ago. He has never used smokeless tobacco. He reports current alcohol use. He reports that he does not use drugs.  Allergies:  Allergies  Allergen Reactions  . Dust Mite Extract   . Grass Extracts [Gramineae Pollens]     No medications prior to admission.    Review of systems otherwise negative  There were no vitals taken for this visit.  Physical exam: Mental status: oriented x3. Eyes: See eye exam associated with this date of surgery Ears, Nose, Throat: within normal limits Neck: Within Normal limits General: within normal limits Chest: Within normal limits Breast: deferred Heart: Within normal limits Abdomen: Within normal limits GU: deferred Extremities: within normal limits Skin: within normal limits  Assessment/Plan 1. Retained lens material, LEFT  EYE  Plan: To [facility] for 25g PPV w/ removal of retained lens material, OS, under general anesthesia - case scheduled for [facility] OR 08 -- 8.27.2020, 1130 am  Gardiner Sleeper, M.D., Ph.D. Vitreoretinal Surgeon [facility]

## 2019-02-28 ENCOUNTER — Encounter (HOSPITAL_COMMUNITY): Payer: Self-pay | Admitting: *Deleted

## 2019-02-28 ENCOUNTER — Other Ambulatory Visit: Payer: Self-pay

## 2019-02-28 NOTE — Anesthesia Preprocedure Evaluation (Addendum)
Anesthesia Evaluation  Patient identified by MRN, date of birth, ID band Patient awake    Reviewed: Allergy & Precautions, NPO status , Patient's Chart, lab work & pertinent test results  Airway Mallampati: II  TM Distance: >3 FB Neck ROM: Full    Dental  (+) Dental Advisory Given   Pulmonary pneumonia, former smoker,    Pulmonary exam normal breath sounds clear to auscultation       Cardiovascular hypertension, Normal cardiovascular exam+ dysrhythmias  Rhythm:Regular Rate:Normal     Neuro/Psych negative neurological ROS  negative psych ROS   GI/Hepatic negative GI ROS, Neg liver ROS,   Endo/Other  Hypothyroidism   Renal/GU negative Renal ROS     Musculoskeletal negative musculoskeletal ROS (+)   Abdominal   Peds  Hematology negative hematology ROS (+)   Anesthesia Other Findings   Reproductive/Obstetrics                           Anesthesia Physical Anesthesia Plan  ASA: III  Anesthesia Plan: General   Post-op Pain Management:    Induction: Intravenous  PONV Risk Score and Plan: Ondansetron, Dexamethasone and Treatment may vary due to age or medical condition  Airway Management Planned: Oral ETT  Additional Equipment: None  Intra-op Plan:   Post-operative Plan: Extubation in OR  Informed Consent: I have reviewed the patients History and Physical, chart, labs and discussed the procedure including the risks, benefits and alternatives for the proposed anesthesia with the patient or authorized representative who has indicated his/her understanding and acceptance.     Dental advisory given  Plan Discussed with: CRNA  Anesthesia Plan Comments: (PAT note written 02/28/2019 by Myra Gianotti, PA-C. )      Anesthesia Quick Evaluation

## 2019-02-28 NOTE — Progress Notes (Signed)
Patient denies shortness of breath, fever, cough and chest pain,  PCP - Dr Carolann Littler Cardiologist - Denies  Chest x-ray - 05/29/18 EKG - 09/04/18 Stress Test - Denies ECHO - Denies Cardiac Cath - Denies  Anesthesia review: Yes, Ebony Hail PA  STOP now taking any Aspirin (unless otherwise instructed by your surgeon), Aleve, Naproxen, Ibuprofen, Motrin, Advil, Goody's, BC's, all herbal medications, fish oil, and all vitamins.   Coronavirus Screening Have you or your wife experienced the following symptoms:  Cough yes/no: No Fever (>100.27F)  yes/no: No Runny nose yes/no: No Sore throat yes/no: No Difficulty breathing/shortness of breath  yes/no: No  Have you or your wife traveled in the last 14 days and where? yes/no: No

## 2019-02-28 NOTE — Progress Notes (Signed)
Anesthesia Chart Review: Ryan [name]   Case: [id number] Date/Time: 03/01/19 1115   Procedure: PARS PLANA VITRECTOMY WITH 25 GAUGE (Left )   Anesthesia type: General   Pre-op diagnosis: retained lens material, left eye   Location: [facility] / [facility] OR   Surgeon: [name], MD      DISCUSSION: Patient is a 73 year old male scheduled for the above procedure.  History includes former smoker (quit 1983), HLD, hypothyroidism, prostate cancer (2014), HTN, hearing loss (hearing aids), dysrhythmia (PACs).  He is a same-day work-up.  No shortness of breath, cough, fever, or chest pain per PAT RN phone interview.  He will need labs on arrival.  02/26/2019 COVID-19 test negative.  Anesthesia team to evaluate on the day of surgery.   VS: Ht 5' 7.25" (1.708 m)   Wt 72.6 kg   BMI 24.87 kg/m   PROVIDERS:  [name], MD is PCP   LABS: For day of surgery   IMAGES: CXR 05/29/18: IMPRESSION: Right upper lobe opacity noted on prior exam is nearly resolved consistent with significantly improved pneumonia.  EKG: 09/04/18:  Sinus rhythm with premature atrial complexes Incomplete right bundle branch block Left anterior fascicular block -EKG reviewed by PCP Dr. Elease Hashimoto. Decreasing caffeine intake recommended. No additional testing ordered. Patient "asymptomatic".    CV: N/A  Past Medical History:  Diagnosis Date  . Back pain   . Cancer [facility]) 2014   prostate cancer  . Cataracts, bilateral   . Dysrhythmia    occasional PAC- asymptomatic, per MD pt cut back on caffeine  . Fungal nail infection   . Hearing loss    wear hearing aids  . HSV infection   . Hyperlipidemia   . Hypertension    no meds, dx yrs ago per patient but normal last several yrs  . Hypothyroidism   . Pneumonia    x 1    Past Surgical History:  Procedure Laterality Date  . ankle surgery trauma Right   . CATARACT EXTRACTION Left 11/29/2018   Dr. [name]  . COLONOSCOPY    . COSMETIC SURGERY     loose skin removed around neck area  . eye lid surgery    . EYE SURGERY    . fungal nail    . LASIK Bilateral   . WISDOM TOOTH EXTRACTION      MEDICATIONS: No current facility-administered medications for this encounter.    Marland Kitchen acyclovir (ZOVIRAX) 400 MG tablet  . ARMOUR THYROID 60 MG tablet  . Ascorbic Acid (VITAMIN C) 1000 MG tablet  . atorvastatin (LIPITOR) 10 MG tablet  . brimonidine (ALPHAGAN) 0.2 % ophthalmic solution  . cetirizine (ZYRTEC) 10 MG tablet  . Cholecalciferol (VITAMIN D) 1000 UNITS capsule  . Coenzyme Q10 100 MG capsule  . Cyanocobalamin (VITAMIN B-12 CR) 1500 MCG TBCR  . Difluprednate (DUREZOL) 0.05 % EMUL  . dorzolamide-timolol (COSOPT) 22.3-6.8 MG/ML ophthalmic solution  . fluticasone (FLONASE) 50 MCG/ACT nasal spray  . loratadine (CLARITIN) 10 MG tablet  . MAGNESIUM CITRATE PO  . tadalafil (CIALIS) 5 MG tablet  . tropicamide (MYDRIACYL) 1 % ophthalmic solution  . vitamin E 400 UNIT capsule  . Zinc 50 MG CAPS    Myra Gianotti, PA-C Surgical Short Stay/Anesthesiology [facility] Phone [phone] [facility] Phone [phone] 02/28/2019 4:49 PM

## 2019-03-01 ENCOUNTER — Ambulatory Visit (HOSPITAL_COMMUNITY)
Admission: RE | Admit: 2019-03-01 | Discharge: 2019-03-01 | Disposition: A | Discharge status: Home / Self Care | Payer: Medicare Other | Attending: Ophthalmology | Admitting: Ophthalmology

## 2019-03-01 ENCOUNTER — Ambulatory Visit (HOSPITAL_COMMUNITY): Payer: Medicare Other | Admitting: Vascular Surgery

## 2019-03-01 ENCOUNTER — Encounter (HOSPITAL_COMMUNITY)
Admission: RE | Disposition: A | Discharge status: Home / Self Care | Payer: Self-pay | Source: Home / Self Care | Attending: Ophthalmology

## 2019-03-01 ENCOUNTER — Encounter (HOSPITAL_COMMUNITY): Payer: Self-pay | Admitting: Certified Registered Nurse Anesthetist

## 2019-03-01 DIAGNOSIS — E039 Hypothyroidism, unspecified: Secondary | ICD-10-CM | POA: Insufficient documentation

## 2019-03-01 DIAGNOSIS — H59022 Cataract (lens) fragments in eye following cataract surgery, left eye: Secondary | ICD-10-CM | POA: Insufficient documentation

## 2019-03-01 DIAGNOSIS — Z8546 Personal history of malignant neoplasm of prostate: Secondary | ICD-10-CM | POA: Insufficient documentation

## 2019-03-01 DIAGNOSIS — H59029 Cataract (lens) fragments in eye following cataract surgery, unspecified eye: Secondary | ICD-10-CM | POA: Diagnosis not present

## 2019-03-01 DIAGNOSIS — I10 Essential (primary) hypertension: Secondary | ICD-10-CM | POA: Diagnosis not present

## 2019-03-01 DIAGNOSIS — E785 Hyperlipidemia, unspecified: Secondary | ICD-10-CM | POA: Insufficient documentation

## 2019-03-01 DIAGNOSIS — Z79899 Other long term (current) drug therapy: Secondary | ICD-10-CM | POA: Diagnosis not present

## 2019-03-01 DIAGNOSIS — Z87891 Personal history of nicotine dependence: Secondary | ICD-10-CM | POA: Diagnosis not present

## 2019-03-01 DIAGNOSIS — Z79890 Hormone replacement therapy (postmenopausal): Secondary | ICD-10-CM | POA: Diagnosis not present

## 2019-03-01 DIAGNOSIS — H44732 Retained (nonmagnetic) (old) foreign body in lens, left eye: Secondary | ICD-10-CM | POA: Diagnosis not present

## 2019-03-01 HISTORY — DX: Pneumonia, unspecified organism: J18.9

## 2019-03-01 HISTORY — DX: Cardiac arrhythmia, unspecified: I49.9

## 2019-03-01 HISTORY — DX: Unspecified hearing loss, unspecified ear: H91.90

## 2019-03-01 HISTORY — PX: PARS PLANA VITRECTOMY: SHX2166

## 2019-03-01 HISTORY — DX: Hypothyroidism, unspecified: E03.9

## 2019-03-01 HISTORY — DX: Herpesviral infection, unspecified: B00.9

## 2019-03-01 HISTORY — PX: REMOVAL RETAINED LENS: SHX6464

## 2019-03-01 HISTORY — DX: Unspecified cataract: H26.9

## 2019-03-01 LAB — CBC
HCT: 49.3 % (ref 39.0–52.0)
Hemoglobin: 16.4 g/dL (ref 13.0–17.0)
MCH: 29.5 pg (ref 26.0–34.0)
MCHC: 33.3 g/dL (ref 30.0–36.0)
MCV: 88.7 fL (ref 80.0–100.0)
Platelets: 210 10*3/uL (ref 150–400)
RBC: 5.56 MIL/uL (ref 4.22–5.81)
RDW: 14.3 % (ref 11.5–15.5)
WBC: 5.6 10*3/uL (ref 4.0–10.5)
nRBC: 0 % (ref 0.0–0.2)

## 2019-03-01 LAB — BASIC METABOLIC PANEL
Anion gap: 10 (ref 5–15)
BUN: 11 mg/dL (ref 8–23)
CO2: 24 mmol/L (ref 22–32)
Calcium: 9.6 mg/dL (ref 8.9–10.3)
Chloride: 106 mmol/L (ref 98–111)
Creatinine, Ser: 0.9 mg/dL (ref 0.61–1.24)
GFR calc Af Amer: >60 mL/min (ref >60)
GFR calc non Af Amer: >60 mL/min (ref >60)
Glucose, Bld: 94 mg/dL (ref 70–99)
Potassium: 4 mmol/L (ref 3.5–5.1)
Sodium: 140 mmol/L (ref 135–145)

## 2019-03-01 SURGERY — PARS PLANA VITRECTOMY WITH 25 GAUGE
Anesthesia: General | Site: Eye | Laterality: Left

## 2019-03-01 MED ORDER — ATROPINE SULFATE 1 % OP SOLN
OPHTHALMIC | Status: DC | PRN
  Administered 2019-03-01: 1 [drp] via OPHTHALMIC

## 2019-03-01 MED ORDER — SODIUM HYALURONATE 10 MG/ML IO SOLN
INTRAOCULAR | Status: DC | PRN
  Administered 2019-03-01: 0.85 mL via INTRAOCULAR

## 2019-03-01 MED ORDER — PHENYLEPHRINE HCL 10 % OP SOLN
1.0000 [drp] | OPHTHALMIC | Status: AC | PRN
  Administered 2019-03-01 (×3): 1 [drp] via OPHTHALMIC
  Filled 2019-03-01: qty 5

## 2019-03-01 MED ORDER — STERILE WATER FOR IRRIGATION IR SOLN
Status: DC | PRN
  Administered 2019-03-01: 1000 mL

## 2019-03-01 MED ORDER — PROPOFOL 10 MG/ML IV BOLUS
INTRAVENOUS | Status: DC | PRN
  Administered 2019-03-01: 120 mg via INTRAVENOUS

## 2019-03-01 MED ORDER — BSS PLUS IO SOLN
INTRAOCULAR | Status: AC
  Filled 2019-03-01: qty 500

## 2019-03-01 MED ORDER — LIDOCAINE 2% (20 MG/ML) 5 ML SYRINGE
INTRAMUSCULAR | Status: DC | PRN
  Administered 2019-03-01: 100 mg via INTRAVENOUS

## 2019-03-01 MED ORDER — FENTANYL CITRATE (PF) 250 MCG/5ML IJ SOLN
INTRAMUSCULAR | Status: AC
  Filled 2019-03-01: qty 5

## 2019-03-01 MED ORDER — SODIUM CHLORIDE 0.9 % IV SOLN
INTRAVENOUS | Status: DC
  Administered 2019-03-01 (×2): via INTRAVENOUS

## 2019-03-01 MED ORDER — CEFTAZIDIME 1 G IJ SOLR
INTRAMUSCULAR | Status: AC
  Filled 2019-03-01: qty 1

## 2019-03-01 MED ORDER — FENTANYL CITRATE (PF) 100 MCG/2ML IJ SOLN
INTRAMUSCULAR | Status: AC
  Filled 2019-03-01: qty 2

## 2019-03-01 MED ORDER — PREDNISOLONE ACETATE 1 % OP SUSP
OPHTHALMIC | Status: DC | PRN
  Administered 2019-03-01: 1 [drp] via OPHTHALMIC

## 2019-03-01 MED ORDER — ACETAZOLAMIDE SODIUM 500 MG IJ SOLR
INTRAMUSCULAR | Status: AC
  Filled 2019-03-01: qty 500

## 2019-03-01 MED ORDER — BACITRACIN-POLYMYXIN B 500-10000 UNIT/GM OP OINT
TOPICAL_OINTMENT | OPHTHALMIC | Status: AC
  Filled 2019-03-01: qty 3.5

## 2019-03-01 MED ORDER — ROCURONIUM BROMIDE 50 MG/5ML IV SOSY
PREFILLED_SYRINGE | INTRAVENOUS | Status: DC | PRN
  Administered 2019-03-01 (×2): 10 mg via INTRAVENOUS
  Administered 2019-03-01: 40 mg via INTRAVENOUS

## 2019-03-01 MED ORDER — ATROPINE SULFATE 1 % OP SOLN
1.0000 [drp] | OPHTHALMIC | Status: AC | PRN
  Administered 2019-03-01 (×3): 1 [drp] via OPHTHALMIC
  Filled 2019-03-01: qty 2

## 2019-03-01 MED ORDER — STERILE WATER FOR INJECTION IJ SOLN
INTRAMUSCULAR | Status: AC
  Filled 2019-03-01: qty 10

## 2019-03-01 MED ORDER — LIDOCAINE HCL (PF) 2 % IJ SOLN
INTRAMUSCULAR | Status: AC
  Filled 2019-03-01: qty 10

## 2019-03-01 MED ORDER — BRIMONIDINE TARTRATE 0.2 % OP SOLN
OPHTHALMIC | Status: AC
  Filled 2019-03-01: qty 5

## 2019-03-01 MED ORDER — PROPOFOL 10 MG/ML IV BOLUS
INTRAVENOUS | Status: AC
  Filled 2019-03-01: qty 20

## 2019-03-01 MED ORDER — BSS IO SOLN
INTRAOCULAR | Status: AC
  Filled 2019-03-01: qty 15

## 2019-03-01 MED ORDER — GATIFLOXACIN 0.5 % OP SOLN OPTIME - NO CHARGE
OPHTHALMIC | Status: DC | PRN
  Administered 2019-03-01: 12:00:00 1 [drp] via OPHTHALMIC

## 2019-03-01 MED ORDER — MEPERIDINE HCL 25 MG/ML IJ SOLN: 6.2500 mg | INTRAMUSCULAR | Status: DC | PRN

## 2019-03-01 MED ORDER — OXYCODONE HCL 5 MG PO TABS
5.0000 mg | ORAL_TABLET | Freq: Once | ORAL | Status: AC
  Administered 2019-03-01: 15:00:00 5 mg via ORAL

## 2019-03-01 MED ORDER — OXYCODONE HCL 5 MG PO TABS
ORAL_TABLET | ORAL | Status: AC
  Filled 2019-03-01: qty 1

## 2019-03-01 MED ORDER — TRAMADOL HCL 50 MG PO TABS
ORAL_TABLET | ORAL | Status: AC
  Filled 2019-03-01: qty 1

## 2019-03-01 MED ORDER — DEXTROSE 5 % IV SOLN
INTRAVENOUS | Status: AC
  Filled 2019-03-01: qty 100

## 2019-03-01 MED ORDER — NA CHONDROIT SULF-NA HYALURON 40-30 MG/ML IO SOLN
INTRAOCULAR | Status: AC
  Filled 2019-03-01: qty 1

## 2019-03-01 MED ORDER — PROPARACAINE HCL 0.5 % OP SOLN
1.0000 [drp] | OPHTHALMIC | Status: AC | PRN
  Administered 2019-03-01 (×3): 1 [drp] via OPHTHALMIC
  Filled 2019-03-01: qty 15

## 2019-03-01 MED ORDER — TRIAMCINOLONE ACETONIDE 40 MG/ML IJ SUSP
INTRAMUSCULAR | Status: DC | PRN
  Administered 2019-03-01: 40 mg via INTRAMUSCULAR

## 2019-03-01 MED ORDER — CARBACHOL 0.01 % IO SOLN
INTRAOCULAR | Status: AC
  Filled 2019-03-01: qty 1.5

## 2019-03-01 MED ORDER — POLYMYXIN B SULFATE 500000 UNITS IJ SOLR
INTRAMUSCULAR | Status: AC
  Filled 2019-03-01: qty 500000

## 2019-03-01 MED ORDER — LIDOCAINE HCL (PF) 4 % IJ SOLN
INTRAMUSCULAR | Status: AC
  Filled 2019-03-01: qty 5

## 2019-03-01 MED ORDER — TRAMADOL HCL 50 MG PO TABS
50.0000 mg | ORAL_TABLET | Freq: Once | ORAL | Status: AC
  Administered 2019-03-01: 50 mg via ORAL

## 2019-03-01 MED ORDER — ATROPINE SULFATE 1 % OP SOLN
OPHTHALMIC | Status: AC
  Filled 2019-03-01: qty 5

## 2019-03-01 MED ORDER — TRIAMCINOLONE ACETONIDE 40 MG/ML IJ SUSP
INTRAMUSCULAR | Status: AC
  Filled 2019-03-01: qty 5

## 2019-03-01 MED ORDER — BRIMONIDINE TARTRATE 0.2 % OP SOLN
OPHTHALMIC | Status: DC | PRN
  Administered 2019-03-01: 1 [drp] via OPHTHALMIC

## 2019-03-01 MED ORDER — SUGAMMADEX SODIUM 200 MG/2ML IV SOLN
INTRAVENOUS | Status: DC | PRN
  Administered 2019-03-01: 200 mg via INTRAVENOUS

## 2019-03-01 MED ORDER — BUPIVACAINE HCL (PF) 0.75 % IJ SOLN
INTRAMUSCULAR | Status: AC
  Filled 2019-03-01: qty 10

## 2019-03-01 MED ORDER — ONDANSETRON HCL 4 MG/2ML IJ SOLN
INTRAMUSCULAR | Status: AC
  Filled 2019-03-01: qty 2

## 2019-03-01 MED ORDER — FENTANYL CITRATE (PF) 100 MCG/2ML IJ SOLN
INTRAMUSCULAR | Status: DC | PRN
  Administered 2019-03-01: 50 ug via INTRAVENOUS
  Administered 2019-03-01: 100 ug via INTRAVENOUS

## 2019-03-01 MED ORDER — BACITRACIN-POLYMYXIN B 500-10000 UNIT/GM OP OINT
TOPICAL_OINTMENT | OPHTHALMIC | Status: DC | PRN
  Administered 2019-03-01: 1 via OPHTHALMIC

## 2019-03-01 MED ORDER — INDOCYANINE GREEN 25 MG IV SOLR
INTRAVENOUS | Status: AC
  Filled 2019-03-01: qty 25

## 2019-03-01 MED ORDER — SODIUM HYALURONATE 10 MG/ML IO SOLN
INTRAOCULAR | Status: AC
  Filled 2019-03-01: qty 0.85

## 2019-03-01 MED ORDER — PHENYLEPHRINE 40 MCG/ML (10ML) SYRINGE FOR IV PUSH (FOR BLOOD PRESSURE SUPPORT)
PREFILLED_SYRINGE | INTRAVENOUS | Status: AC
  Filled 2019-03-01: qty 10

## 2019-03-01 MED ORDER — TROPICAMIDE 1 % OP SOLN
1.0000 [drp] | OPHTHALMIC | Status: AC | PRN
  Administered 2019-03-01 (×3): 1 [drp] via OPHTHALMIC
  Filled 2019-03-01: qty 15

## 2019-03-01 MED ORDER — STERILE WATER FOR INJECTION IJ SOLN
INTRAMUSCULAR | Status: DC | PRN
  Administered 2019-03-01: 20 mL

## 2019-03-01 MED ORDER — ONDANSETRON HCL 4 MG/2ML IJ SOLN
INTRAMUSCULAR | Status: DC | PRN
  Administered 2019-03-01: 4 mg via INTRAVENOUS

## 2019-03-01 MED ORDER — ONDANSETRON HCL 4 MG/2ML IJ SOLN: 4.0000 mg | Freq: Once | INTRAMUSCULAR | Status: DC | PRN

## 2019-03-01 MED ORDER — DEXAMETHASONE SODIUM PHOSPHATE 10 MG/ML IJ SOLN
INTRAMUSCULAR | Status: AC
  Filled 2019-03-01: qty 1

## 2019-03-01 MED ORDER — EPINEPHRINE PF 1 MG/ML IJ SOLN
INTRAMUSCULAR | Status: AC
  Filled 2019-03-01: qty 1

## 2019-03-01 MED ORDER — SODIUM CHLORIDE (PF) 0.9 % IJ SOLN
INTRAMUSCULAR | Status: AC
  Filled 2019-03-01: qty 10

## 2019-03-01 MED ORDER — DORZOLAMIDE HCL-TIMOLOL MAL 2-0.5 % OP SOLN
OPHTHALMIC | Status: AC
  Filled 2019-03-01: qty 10

## 2019-03-01 MED ORDER — NA CHONDROIT SULF-NA HYALURON 40-30 MG/ML IO SOLN
INTRAOCULAR | Status: DC | PRN
  Administered 2019-03-01: 0.5 mL via INTRAOCULAR

## 2019-03-01 MED ORDER — DORZOLAMIDE HCL-TIMOLOL MAL 2-0.5 % OP SOLN
OPHTHALMIC | Status: DC | PRN
  Administered 2019-03-01: 1 [drp] via OPHTHALMIC

## 2019-03-01 MED ORDER — HYDROMORPHONE HCL 1 MG/ML IJ SOLN
INTRAMUSCULAR | Status: AC
  Filled 2019-03-01: qty 1

## 2019-03-01 MED ORDER — HYDROMORPHONE HCL 1 MG/ML IJ SOLN
0.2500 mg | INTRAMUSCULAR | Status: DC | PRN
  Administered 2019-03-01: 0.5 mg via INTRAVENOUS

## 2019-03-01 MED ORDER — EPINEPHRINE PF 1 MG/ML IJ SOLN
INTRAOCULAR | Status: DC | PRN
  Administered 2019-03-01: 500.3 mL

## 2019-03-01 MED ORDER — TOBRAMYCIN-DEXAMETHASONE 0.3-0.1 % OP OINT
TOPICAL_OINTMENT | OPHTHALMIC | Status: AC
  Filled 2019-03-01: qty 3.5

## 2019-03-01 MED ORDER — BSS IO SOLN
INTRAOCULAR | Status: DC | PRN
  Administered 2019-03-01: 15 mL via INTRAOCULAR

## 2019-03-01 MED ORDER — PREDNISOLONE ACETATE 1 % OP SUSP
OPHTHALMIC | Status: AC
  Filled 2019-03-01: qty 5

## 2019-03-01 MED ORDER — FENTANYL CITRATE (PF) 100 MCG/2ML IJ SOLN
25.0000 ug | INTRAMUSCULAR | Status: DC | PRN
  Administered 2019-03-01 (×2): 50 ug via INTRAVENOUS

## 2019-03-01 MED ORDER — GATIFLOXACIN 0.5 % OP SOLN
OPHTHALMIC | Status: AC
  Filled 2019-03-01: qty 2.5

## 2019-03-01 MED ORDER — DEXAMETHASONE SODIUM PHOSPHATE 10 MG/ML IJ SOLN
INTRAMUSCULAR | Status: DC | PRN
  Administered 2019-03-01: 5 mg via INTRAVENOUS

## 2019-03-01 SURGICAL SUPPLY — 72 items
APPLICATOR COTTON TIP 6 STRL (MISCELLANEOUS) ×4 IMPLANT
APPLICATOR COTTON TIP 6IN STRL (MISCELLANEOUS) ×12
BLADE EYE CATARACT 19 1.4 BEAV (BLADE) IMPLANT
BNDG EYE OVAL (GAUZE/BANDAGES/DRESSINGS) ×3 IMPLANT
CABLE BIPOLOR RESECTION CORD (MISCELLANEOUS) ×3 IMPLANT
CANNULA ANT CHAM MAIN (OPHTHALMIC RELATED) IMPLANT
CANNULA FLEX TIP 25G (CANNULA) ×3 IMPLANT
CANNULA TROCAR 23 GA VLV (OPHTHALMIC) IMPLANT
CANNULA TROCAR 23G VLV (OPHTHALMIC) IMPLANT
CANNULA VLV SOFT TIP 25G (OPHTHALMIC) IMPLANT
CANNULA VLV SOFT TIP 25GA (OPHTHALMIC) IMPLANT
CLOSURE STERI-STRIP 1/2X4 (GAUZE/BANDAGES/DRESSINGS) ×1
CLSR STERI-STRIP ANTIMIC 1/2X4 (GAUZE/BANDAGES/DRESSINGS) ×2 IMPLANT
COTTONBALL LRG STERILE PKG (GAUZE/BANDAGES/DRESSINGS) ×9 IMPLANT
COVER WAND RF STERILE (DRAPES) ×1 IMPLANT
DRAPE MICROSCOPE LEICA 46X105 (MISCELLANEOUS) ×3 IMPLANT
DRAPE OPHTHALMIC 77X100 STRL (CUSTOM PROCEDURE TRAY) ×3 IMPLANT
ERASER HMR WETFIELD 23G BP (MISCELLANEOUS) IMPLANT
FILTER BLUE MILLIPORE (MISCELLANEOUS) IMPLANT
FILTER STRAW FLUID ASPIR (MISCELLANEOUS) IMPLANT
FORCEPS GRIESHABER ILM 25G A (INSTRUMENTS) IMPLANT
GAS AUTO FILL CONSTEL (OPHTHALMIC)
GAS AUTO FILL CONSTELLATION (OPHTHALMIC) IMPLANT
GLOVE BIO SURGEON STRL SZ7.5 (GLOVE) ×6 IMPLANT
GLOVE BIOGEL M 7.0 STRL (GLOVE) ×3 IMPLANT
GOWN STRL REUS W/ TWL LRG LVL3 (GOWN DISPOSABLE) ×2 IMPLANT
GOWN STRL REUS W/ TWL XL LVL3 (GOWN DISPOSABLE) ×1 IMPLANT
GOWN STRL REUS W/TWL LRG LVL3 (GOWN DISPOSABLE) ×4
GOWN STRL REUS W/TWL XL LVL3 (GOWN DISPOSABLE) ×2
KIT BASIN OR (CUSTOM PROCEDURE TRAY) ×3 IMPLANT
KIT PERFLUORON PROCEDURE 5ML (MISCELLANEOUS) IMPLANT
LENS MACULAR ASPHERIC CONSTEL (OPHTHALMIC) IMPLANT
LENS VITRECTOMY FLAT OCLR DISP (MISCELLANEOUS) IMPLANT
LOOP FINESSE 25 GA (MISCELLANEOUS) IMPLANT
MICROPICK 25G (MISCELLANEOUS)
NDL 18GX1X1/2 (RX/OR ONLY) (NEEDLE) ×1 IMPLANT
NDL 25GX 5/8IN NON SAFETY (NEEDLE) ×3 IMPLANT
NDL HYPO 30X.5 LL (NEEDLE) ×2 IMPLANT
NDL PRECISIONGLIDE 27X1.5 (NEEDLE) IMPLANT
NEEDLE 18GX1X1/2 (RX/OR ONLY) (NEEDLE) ×3 IMPLANT
NEEDLE 25GX 5/8IN NON SAFETY (NEEDLE) ×9 IMPLANT
NEEDLE HYPO 30X.5 LL (NEEDLE) ×6 IMPLANT
NEEDLE PRECISIONGLIDE 27X1.5 (NEEDLE) IMPLANT
NS IRRIG 1000ML POUR BTL (IV SOLUTION) ×3 IMPLANT
PACK VITRECTOMY CUSTOM (CUSTOM PROCEDURE TRAY) ×3 IMPLANT
PAD ARMBOARD 7.5X6 YLW CONV (MISCELLANEOUS) ×6 IMPLANT
PAK PIK VITRECTOMY CVS 25GA (OPHTHALMIC) ×3 IMPLANT
PENCIL BIPOLAR 25GA STR DISP (OPHTHALMIC RELATED) ×3 IMPLANT
PICK MICROPICK 25G (MISCELLANEOUS) IMPLANT
PROBE DIATHERMY DSP 27GA (MISCELLANEOUS) IMPLANT
PROBE ENDO DIATHERMY 25G (MISCELLANEOUS) IMPLANT
PROBE LASER ILLUM FLEX CVD 25G (OPHTHALMIC) IMPLANT
REPL STRA BRUSH NDL (NEEDLE) IMPLANT
REPL STRA BRUSH NEEDLE (NEEDLE) IMPLANT
RESERVOIR BACK FLUSH (MISCELLANEOUS) IMPLANT
RETRACTOR IRIS FLEX 25G GRIESH (INSTRUMENTS) IMPLANT
SET INJECTOR OIL FLUID CONSTEL (OPHTHALMIC) IMPLANT
SHIELD EYE LENSE ONLY DISP (GAUZE/BANDAGES/DRESSINGS) ×2 IMPLANT
SPEAR EYE SURG WECK-CEL (MISCELLANEOUS) ×4 IMPLANT
SPONGE SURGIFOAM ABS GEL 12-7 (HEMOSTASIS) IMPLANT
STOPCOCK 4 WAY LG BORE MALE ST (IV SETS) IMPLANT
SUT VICRYL 7 0 TG140 8 (SUTURE) ×3 IMPLANT
SYR 10ML LL (SYRINGE) ×5 IMPLANT
SYR 20ML LL LF (SYRINGE) ×3 IMPLANT
SYR 5ML LL (SYRINGE) ×3 IMPLANT
SYR BULB 3OZ (MISCELLANEOUS) ×3 IMPLANT
SYR TB 1ML LUER SLIP (SYRINGE) ×6 IMPLANT
TAPE SURG TRANSPORE 1 IN (GAUZE/BANDAGES/DRESSINGS) IMPLANT
TAPE SURGICAL TRANSPORE 1 IN (GAUZE/BANDAGES/DRESSINGS) ×2
TOWEL GREEN STERILE FF (TOWEL DISPOSABLE) ×3 IMPLANT
TUBING HIGH PRESS EXTEN 6IN (TUBING) ×3 IMPLANT
WATER STERILE IRR 1000ML POUR (IV SOLUTION) ×3 IMPLANT

## 2019-03-01 NOTE — Transfer of Care (Signed)
Immediate Anesthesia Transfer of Care Note  Patient: Ryan [name]  Procedure(s) Performed: PARS PLANA VITRECTOMY WITH 25 GAUGE (Left Eye) REMOVAL OF RETAINED LENS MATERIALS LEFT EYE (Left Eye)  Patient Location: PACU  Anesthesia Type:General  Level of Consciousness: awake, alert  and drowsy  Airway & Oxygen Therapy: Patient Spontanous Breathing and Patient connected to nasal cannula oxygen  Post-op Assessment: Report given to RN and Post -op Vital signs reviewed and stable  Post vital signs: Reviewed and stable  Last Vitals:  Vitals Value Taken Time  BP 161/81 03/01/19 1358  Temp 36.6 C 03/01/19 1359  Pulse 70 03/01/19 1402  Resp 20 03/01/19 1402  SpO2 93 % 03/01/19 1402  Vitals shown include unvalidated device data.  Last Pain:  Vitals:   03/01/19 0917  TempSrc: Oral         Complications: No apparent anesthesia complications

## 2019-03-01 NOTE — Progress Notes (Signed)
[facility] Note  03/02/2019     CHIEF COMPLAINT Patient presents for Retina Follow Up and Post-op Follow-up   HISTORY OF PRESENT ILLNESS: Ryan [name] is a 73 y.o. male who presents to the clinic today for:   HPI    Retina Follow Up    Patient presents with  Other.  In left eye.  This started 1 day ago.  Severity is moderate.  I, the attending physician,  performed the HPI with the patient and updated documentation appropriately.          Post-op Follow-up    In left eye.  Discomfort includes foreign body sensation and tearing.  I, the attending physician,  performed the HPI with the patient and updated documentation appropriately.          Comments    Patient here 1 day retina follow up for POD1-PPV for retained lens materia OS. Patient states vision is hazy and foggy. Brighter. Has eye pain.        Last edited by [name], MD on 03/02/2019  9:12 AM. (History)    pt states states he is in a little bit of pain this morning, he states he took tylenol last night and this morning, he states he slept with his head elavated and kept the eye patch on until this morning  Referring physician: [name], MD [place],  [place] [address]  HISTORICAL INFORMATION:   Selected notes from the [facility] Referred by Dr. Rutherford Guys for concern of possible endophthalmitis OS LEE: 05.29.20 [name]) Ocular Hx-s/p cat sx 05.27.20 PMH-   CURRENT MEDICATIONS: Current Outpatient Medications (Ophthalmic Drugs)  Medication Sig  . brimonidine (ALPHAGAN) 0.2 % ophthalmic solution Place 1 drop into the left eye 3 (three) times daily.  . Difluprednate (DUREZOL) 0.05 % EMUL Place 1 drop into the left eye 4 (four) times daily.  . dorzolamide-timolol (COSOPT) 22.3-6.8 MG/ML ophthalmic solution Place 1 drop into the left eye 3 (three) times daily.  Marland Kitchen tropicamide (MYDRIACYL) 1 % ophthalmic solution Place 1 drop into the  left eye 2 (two) times daily.   No current facility-administered medications for this visit.  (Ophthalmic Drugs)   Current Outpatient Medications (Other)  Medication Sig  . acyclovir (ZOVIRAX) 400 MG tablet Take 1 tablet (400 mg total) by mouth daily. (Patient taking differently: Take 400 mg by mouth daily as needed (cold sores). )  . ARMOUR THYROID 60 MG tablet TAKE 1 TABLET DAILY (Patient taking differently: Take 60 mg by mouth daily before breakfast. )  . Ascorbic Acid (VITAMIN C) 1000 MG tablet Take 1,000 mg by mouth daily.    Marland Kitchen atorvastatin (LIPITOR) 10 MG tablet Take 10 mg by mouth at bedtime.   . cetirizine (ZYRTEC) 10 MG tablet Take 10 mg by mouth at bedtime as needed for allergies.   . Cholecalciferol (VITAMIN D) 1000 UNITS capsule Take 1,000 Units by mouth daily.    . Coenzyme Q10 100 MG capsule Take 100 mg by mouth daily.   . Cyanocobalamin (VITAMIN B-12 CR) 1500 MCG TBCR Take 1,500 mcg by mouth daily.   . fluticasone (FLONASE) 50 MCG/ACT nasal spray Place 1 spray into both nostrils daily as needed for allergies or rhinitis.  Marland Kitchen loratadine (CLARITIN) 10 MG tablet Take 10 mg by mouth daily as needed for allergies.   Marland Kitchen MAGNESIUM CITRATE PO Take 400 mg by mouth daily.   . tadalafil (CIALIS) 5 MG tablet  Take 5 mg by mouth daily as needed for erectile dysfunction.   . vitamin E 400 UNIT capsule Take 400 Units by mouth daily.    . Zinc 50 MG CAPS Take 50 mg by mouth daily.    No current facility-administered medications for this visit.  (Other)      REVIEW OF SYSTEMS: ROS    Positive for: Eyes   Negative for: Constitutional, Gastrointestinal, Neurological, Skin, Genitourinary, Musculoskeletal, HENT, Endocrine, Cardiovascular, Respiratory, Psychiatric, Allergic/Imm, Heme/Lymph   Last edited by Theodore Demark, COA on 03/02/2019  8:43 AM. (History)       ALLERGIES Allergies  Allergen Reactions  . Dust Mite Extract     UNSPECIFIED REACTION   . Grass Extracts [Gramineae  Pollens]     UNSPECIFIED REACTION     PAST MEDICAL HISTORY Past Medical History:  Diagnosis Date  . Back pain   . Cancer [facility]) 2014   prostate cancer  . Cataracts, bilateral   . Dysrhythmia    occasional PAC- asymptomatic, per MD pt cut back on caffeine  . Fungal nail infection   . Hearing loss    wear hearing aids  . HSV infection   . Hyperlipidemia   . Hypertension    no meds, dx yrs ago per patient but normal last several yrs  . Hypothyroidism   . Pneumonia    x 1   Past Surgical History:  Procedure Laterality Date  . ankle surgery trauma Right   . CATARACT EXTRACTION Left 11/29/2018   Dr. [name]  . COLONOSCOPY    . COSMETIC SURGERY     loose skin removed around neck area  . eye lid surgery    . EYE SURGERY    . fungal nail    . LASIK Bilateral   . WISDOM TOOTH EXTRACTION      FAMILY HISTORY Family History  Problem Relation Age of Onset  . Heart disease Mother   . Liver disease Father   . Alcohol abuse Father   . Cancer Father        hepatic cancer ?primary  . Arthritis Sister   . COPD Sister     SOCIAL HISTORY Social History   Tobacco Use  . Smoking status: Former Smoker    Types: Cigarettes    Quit date: 07/05/1981    Years since quitting: 37.6  . Smokeless tobacco: Never Used  Substance Use Topics  . Alcohol use: Yes    Alcohol/week: 3.0 - 4.0 standard drinks    Types: 3 - 4 Glasses of wine per week  . Drug use: No         OPHTHALMIC EXAM:  Base Eye Exam    Visual Acuity (Snellen - Linear)      Right Left   Dist [place] 20/20 20/100 -1   Dist ph [place]  20/40 -1       Tonometry (Tonopen, 8:41 AM)      Right Left   Pressure 14 15       Neuro/Psych    Oriented x3: Yes   Mood/Affect: Normal       Dilation    Left eye: 1.0% Mydriacyl, 2.5% Phenylephrine @ 8:41 AM        Slit Lamp and Fundus Exam    External Exam      Right Left   External  Periorbital Ecchymosis       Slit Lamp Exam      Right Left   Lids/Lashes  Dermatochalasis - upper lid,  Meibomian gland dysfunction Dermatochalasis - upper lid, Meibomian gland dysfunction, Telangiectasia, Periorbital Ecchymosis   Conjunctiva/Sclera Trace nasal Pinguecula mild nasal pterygeium , Subconjunctival hemorrhage, sutures intact   Cornea Arcus, 1+ Punctate epithelial erosions, abrely visible lasik flap 2+ Punctate epithelial erosions, nasal pterygeium, lasik flap in good position with epithelial in growth at 0430,    Anterior Chamber Deep and quiet Deep, 3-4+ Cell/pigment, no hypopyon   Iris Round and dilated Round and moderately dilated to 5.65mm, mild TIDs at 0730-0800   Lens 2+ Nuclear sclerosis, 1-2+ Cortical cataract 3 piece sulcus IOL in good position and stable, no pseudophakodenisis; mild capsular phimosis and fibrosis   Vitreous Vitreous syneresis post vitrectomy, mild pigment, air bubble 20-25%, retained lens material gone       Fundus Exam      Right Left   Disc  Pink and Sharp   C/D Ratio 0.2 0.2   Macula  Flat, Blunted foveal reflex, mild RPE changes, No heme or edema   Vessels  Mild Vascular attenuation, mild AV crossing changes   Periphery  Attached, pigmented CR atrophy at 1030, early laser changes          IMAGING AND PROCEDURES  Imaging and Procedures for @TODAY @           ASSESSMENT/PLAN:    ICD-10-CM   1. Retained lens material following cataract surgery of left eye  H59.022   2. Ocular hypertension, left  H40.052   3. Epiretinal membrane (ERM) of right eye  H35.371   4. Retinal edema  H35.81   5. Essential hypertension  I10   6. Hypertensive retinopathy of both eyes  H35.033   7. Combined forms of age-related cataract of right eye  H25.811   8. Pseudophakia  Z96.1     1,2. Retained Lens Material with ocular hypertension OS  - complicated CE + sulcus IOL OS (5.27.20, Shapiro) -- posterior capsule rupture and anterior vitrectomy  - sulcus IOL in excellent position -- stable without pseudophakodonesis; +capsular  phimosis/fibrosis  - small cortical fragments in vitreous persist but are disintegrating; small piece in inferior angle resolved  - tMax 42 OS on POD2 in Dr. Kellie Moor office -- improved to 88 on Combigan  - now POD1 s/p PPV/EL/FAX OS, 08.27.20             - doing well this morning  - retained lens material gone             - IOP okay at 15  - air bubble 20-25%             - start Durezol 6x/day OS                         zymaxid QID OS                         Atropine QDaily OS                         Brimonidine BID OS                         PSO ung QID OS             - keep head elavated; avoid laying flat on back             - eye shield when sleeping             -  post op drop and positioning instructions reviewed             - tylenol/ibuprofen for pain  - f/u 1 week  3,4. Epiretinal membrane, right eye  - mild ERM  - asymptomatic, no metamorphopsia  - no indication for surgery at this time  - monitor  5,6. Hypertensive retinopathy OU  - discussed importance of tight BP control  - monitor  7. Mixed form age related cataract OD  - under the expert management of Dr. [name]  - surgery OD has been postponed due to issues OS  8. Pseudophakia OS  - s/p CE + sulcus IOL (5.27.20, Dr. [name])  - IOL in good position  - retained lens material as above   Ophthalmic Meds Ordered this visit:  No orders of the defined types were placed in this encounter.      Return in about 1 week (around 03/09/2019) for s/p PPV for retained lens material OS, DFE, OCT.  There are no Patient Instructions on file for this visit.   Explained the diagnoses, plan, and follow up with the patient and they expressed understanding.  Patient expressed understanding of the importance of proper follow up care.   This document serves as a record of services personally performed by Gardiner Sleeper, MD, PhD. It was created on their behalf by Ernest Mallick, OA, an ophthalmic assistant. The  creation of this record is the provider's dictation and/or activities during the visit.    Electronically signed by: Ernest Mallick, OA  08.27.2020 9:13 AM     Gardiner Sleeper, M.D., Ph.D. Diseases & Surgery of the Retina and Vitreous [facility]  I have reviewed the above documentation for accuracy and completeness, and I agree with the above. Gardiner Sleeper, M.D., Ph.D. 03/02/19 9:15 AM   Abbreviations: M myopia (nearsighted); A astigmatism; H hyperopia (farsighted); P presbyopia; Mrx spectacle prescription;  CTL contact lenses; OD right eye; OS left eye; OU both eyes  XT exotropia; ET esotropia; PEK punctate epithelial keratitis; PEE punctate epithelial erosions; DES dry eye syndrome; MGD meibomian gland dysfunction; ATs artificial tears; PFAT's preservative free artificial tears; [place] nuclear sclerotic cataract; PSC posterior subcapsular cataract; ERM epi-retinal membrane; PVD posterior vitreous detachment; RD retinal detachment; DM diabetes mellitus; DR diabetic retinopathy; NPDR non-proliferative diabetic retinopathy; PDR proliferative diabetic retinopathy; CSME clinically significant macular edema; DME diabetic macular edema; dbh dot blot hemorrhages; CWS cotton wool spot; POAG primary open angle glaucoma; C/D cup-to-disc ratio; HVF humphrey visual field; GVF goldmann visual field; OCT optical coherence tomography; IOP intraocular pressure; BRVO Branch retinal vein occlusion; CRVO central retinal vein occlusion; CRAO central retinal artery occlusion; BRAO branch retinal artery occlusion; RT retinal tear; SB scleral buckle; PPV pars plana vitrectomy; VH Vitreous hemorrhage; PRP panretinal laser photocoagulation; IVK intravitreal kenalog; VMT vitreomacular traction; MH Macular hole;  NVD neovascularization of the disc; NVE neovascularization elsewhere; AREDS age related eye disease study; ARMD age related macular degeneration; POAG primary open angle glaucoma; EBMD  epithelial/anterior basement membrane dystrophy; ACIOL anterior chamber intraocular lens; IOL intraocular lens; PCIOL posterior chamber intraocular lens; Phaco/IOL phacoemulsification with intraocular lens placement; [place] photorefractive keratectomy; LASIK laser assisted in situ keratomileusis; HTN hypertension; DM diabetes mellitus; COPD chronic obstructive pulmonary disease

## 2019-03-01 NOTE — Discharge Instructions (Signed)
POSTOPERATIVE INSTRUCTIONS  Your doctor has performed vitreoretinal surgery on you at [facility]. [place] eye patched and shielded until seen by Dr. Coralyn Pear 845 AM tomorrow in clinic - Do not use drops until return - Keep head elevated while awake - Sleep with head elevated 30-45 degrees    - No strenuous bending, stooping or lifting.  - You may not drive until further notice.  - If your doctor used a gas bubble in your eye during the procedure he will advise you on postoperative positioning. If you have a gas bubble you will be wearing a green bracelet that was applied in the operating room. The green bracelet should stay on as long as the gas bubble is in your eye. While the gas bubble is present you should not fly in an airplane. If you require general anesthesia while the gas bubble is present you must notify your anesthesiologist that an intraocular gas bubble is present so he can take the appropriate precautions.  - Tylenol or any other over-the-counter pain reliever can be used according to your doctor. If more pain medicine is required, your doctor will have a prescription for you.  - You may read, go up and down stairs, and watch television.     [name], M.D., Ph.D.

## 2019-03-01 NOTE — Brief Op Note (Signed)
03/01/2019  2:00 PM  PATIENT:  Ryan [name]  73 y.o. male  PRE-OPERATIVE DIAGNOSIS:  retained lens material, left eye  POST-OPERATIVE DIAGNOSIS:  retained lens material, left eye  PROCEDURE:  Procedure(s): PARS PLANA VITRECTOMY WITH 25 GAUGE (Left) REMOVAL OF RETAINED LENS MATERIALS LEFT EYE (Left)  SURGEON:  Surgeon(s) and Role:    [name], MD - Primary  ASSISTANTS: Ernest Mallick, Ophthalmic Assistant  ANESTHESIA:   general  EBL:  0 mL   BLOOD ADMINISTERED:none  DRAINS: none   LOCAL MEDICATIONS USED:  NONE  SPECIMEN:  No Specimen  DISPOSITION OF SPECIMEN:  N/A  COUNTS:  YES  TOURNIQUET:  * No tourniquets in log *  DICTATION: .Note written in EPIC  PLAN OF CARE: Discharge to home after PACU  PATIENT DISPOSITION:  PACU - hemodynamically stable.   Delay start of Pharmacological VTE agent (>24hrs) due to surgical blood loss or risk of bleeding: not applicable

## 2019-03-01 NOTE — Progress Notes (Signed)
Pain in os remains / now rates at 9/10---spoke with dr germeroth re: same / orders rec'd

## 2019-03-01 NOTE — Anesthesia Procedure Notes (Signed)
Procedure Name: Intubation Date/Time: 03/01/2019 11:49 AM Performed by: Inda Coke, CRNA Pre-anesthesia Checklist: Patient identified, Emergency Drugs available, Suction available and Patient being monitored Patient Re-evaluated:Patient Re-evaluated prior to induction Oxygen Delivery Method: Circle System Utilized Preoxygenation: Pre-oxygenation with 100% oxygen Induction Type: IV induction Ventilation: Mask ventilation without difficulty Laryngoscope Size: Mac and 4 Grade View: Grade I Tube type: Oral Tube size: 7.5 mm Number of attempts: 1 Airway Equipment and Method: Stylet and Oral airway Placement Confirmation: ETT inserted through vocal cords under direct vision,  positive ETCO2 and breath sounds checked- equal and bilateral Secured at: 22 cm Tube secured with: Tape Dental Injury: Teeth and Oropharynx as per pre-operative assessment

## 2019-03-01 NOTE — Op Note (Signed)
Date of procedure: 08.27.2020   Surgeon: [name], M.D., Ph.D    Assistant: Ernest Mallick, Ophthalmic Assistant   Pre-operative Diagnosis:  1. Retained lens material, Left eye   Post-operative diagnosis:  Same   Anesthesia: General   Procedure: 1)     25 gauge pars plana vitrectomy w/ endolaser, Left Eye CPT [phone])     Pars plana lensectomy, Left Eye   Complications: none Estimated blood loss: minimal Specimens: none   Brief history:   The patient has a history of decreased vision in the affected eye, and on examination, was noted to have retained lens material. The risks, benefits, and alternatives were explained to the patient, including pain, bleeding, infection, loss of vision, double vision, droopy eyelids, and need for more surgeries.  Informed consent was obtained from the patient and placed in the chart.     Procedure: The patient was brought to the preoperative holding area where the correct eye was confirmed and marked.  The patient was then brought to the operating room where general endotracheal anesthesia was induced by the Anesthesia team without complication. A time-out was performed to identify the correct patient, eye, procedure, and any allergies. The eye was prepped and draped in the usual sterile ophthalmic fashion followed by placement of a lid speculum.    A 25 gauge trocar was placed in the inferotemporal quadrant 3.5 mm posterior to the limbus in a beveled fashion. A 4 mm infusion cannula was placed through this trocar, and the infusion cannula was confirmed in the vitreous cavity with no incarceration of retina or choroid prior to turning it on. Two additional 25 gauge trocars were placed in the superonasal and superotemporal quadrants in a similar beveled fashion. The light pipe and cutter were introduced into the eye. Direct vitrectomy was was performed to remove the anterior hyaloid and to remove some PCO/fibrosis along the posterior optic.    At this  time, a standard three-port pars plana vitrectomy was performed using the light pipe, the cutter, and the BIOM viewing system. Kenalog was used to highlight the vitreous. Care was taken to insure the posterior vitreous was detached. A thorough core and peripheral vitreous dissection was performed. Numerous cortical fragments were suspended in the inferior vitreous and was removed using the vitrectomy probe. Inspection of the peripheral retina was performed under scleral depression and of note, there was a pigmented retinal defect / CR scar at 1030 anterior to equator. Next, endolaser was placed around this retinal defect and prophylactic endolaser was placed 360 using the laser probe. Of note, there were no other peripheral breaks or tears.  A limited fluid air exchange was performed ensure removal of all retained lens fragments and the vitreous cavity was left with a ~30% air bubble to assist in closure of the sclerotomies.   The trocars were then removed and sutured with 7-0 vicryl in an interrupted fashion. The conjunctiva was closed with 7-0 vicryl in an interrupted fashion. Subconjunctival injections of antibiotic and Kenalog were administered. The lid speculum and drapes were removed. Drops of an antibiotic and steroid were given. The eye was patched and shielded. The patient tolerated the procedure well without any intraoperative or immediate postoperative complications. The patient was taken to the recovery room in good condition. The patient was instructed to follow-up with Dr. Coralyn Pear in clinic on the following morning.

## 2019-03-01 NOTE — Interval H&P Note (Signed)
History and Physical Interval Note:  03/01/2019 10:11 AM  Ryan [name]  has presented today for surgery, with the diagnosis of retained lens material, left eye.  The various methods of treatment have been discussed with the patient and family. After consideration of risks, benefits and other options for treatment, the patient has consented to  Procedure(s): PARS PLANA VITRECTOMY WITH 25 GAUGE (Left) as a surgical intervention.  The patient's history has been reviewed, patient examined, no change in status, stable for surgery.  I have reviewed the patient's chart and labs.  Questions were answered to the patient's satisfaction.     [name]

## 2019-03-02 ENCOUNTER — Other Ambulatory Visit: Payer: Self-pay

## 2019-03-02 ENCOUNTER — Ambulatory Visit (INDEPENDENT_AMBULATORY_CARE_PROVIDER_SITE_OTHER): Payer: Medicare Other | Admitting: Ophthalmology

## 2019-03-02 ENCOUNTER — Encounter (INDEPENDENT_AMBULATORY_CARE_PROVIDER_SITE_OTHER): Payer: Self-pay | Admitting: Ophthalmology

## 2019-03-02 DIAGNOSIS — H59022 Cataract (lens) fragments in eye following cataract surgery, left eye: Secondary | ICD-10-CM

## 2019-03-02 DIAGNOSIS — I10 Essential (primary) hypertension: Secondary | ICD-10-CM

## 2019-03-02 DIAGNOSIS — H40052 Ocular hypertension, left eye: Secondary | ICD-10-CM

## 2019-03-02 DIAGNOSIS — H25811 Combined forms of age-related cataract, right eye: Secondary | ICD-10-CM

## 2019-03-02 DIAGNOSIS — H35033 Hypertensive retinopathy, bilateral: Secondary | ICD-10-CM

## 2019-03-02 DIAGNOSIS — H35371 Puckering of macula, right eye: Secondary | ICD-10-CM

## 2019-03-02 DIAGNOSIS — H3581 Retinal edema: Secondary | ICD-10-CM

## 2019-03-02 DIAGNOSIS — Z961 Presence of intraocular lens: Secondary | ICD-10-CM

## 2019-03-02 NOTE — Anesthesia Postprocedure Evaluation (Signed)
Anesthesia Post Note  Patient: Ryan [name]  Procedure(s) Performed: PARS PLANA VITRECTOMY WITH 25 GAUGE (Left Eye) REMOVAL OF RETAINED LENS MATERIALS LEFT EYE (Left Eye)     Patient location during evaluation: PACU Anesthesia Type: General Level of consciousness: sedated and patient cooperative Pain management: pain level controlled Vital Signs Assessment: post-procedure vital signs reviewed and stable Respiratory status: spontaneous breathing Cardiovascular status: stable Anesthetic complications: no    Last Vitals:  Vitals:   03/01/19 1458 03/01/19 1500  BP:  (!) 147/67  Pulse: (!) 57 (!) 59  Resp: 13 10  Temp:  36.6 C  SpO2: 97% 95%    Last Pain:  Vitals:   03/01/19 1445  TempSrc:   PainSc: [place]

## 2019-03-07 NOTE — Progress Notes (Signed)
[facility] Note  03/08/2019     CHIEF COMPLAINT Patient presents for Retina Follow Up   HISTORY OF PRESENT ILLNESS: Ryan [name] is a 73 y.o. male who presents to the clinic today for:   HPI    Retina Follow Up    Patient presents with  Other.  In left eye.  This started 1 week ago.  Severity is moderate.  I, the attending physician,  performed the HPI with the patient and updated documentation appropriately.          Comments    Patient here for 1 week retina follow up for retained lens material OS ( s/p PPV 08.27) Patient states vision OS still blurry. The scratchiness hs lessened. The air bubble is gone. The upper and lower eyelids are tender, sore. Getting better. Not as bad as was.        Last edited by [name], MD on 03/08/2019  9:01 AM. (History)    pt states pt states his air bubble was gone by Sunday evening, he states his eyelids are tender and sore and his eye is still scratchy   Referring physician: Rutherford Guys, MD [place],  [place] [id number]  HISTORICAL INFORMATION:   Selected notes from the MEDICAL RECORD NUMBER Referred by Dr. Rutherford Guys for concern of possible endophthalmitis OS LEE: 05.29.20 [name]) Ocular Hx-s/p cat sx 05.27.20 PMH-   CURRENT MEDICATIONS: Current Outpatient Medications (Ophthalmic Drugs)  Medication Sig  . brimonidine (ALPHAGAN) 0.2 % ophthalmic solution Place 1 drop into the left eye 3 (three) times daily.  . Difluprednate (DUREZOL) 0.05 % EMUL Place 1 drop into the left eye 4 (four) times daily.  . dorzolamide-timolol (COSOPT) 22.3-6.8 MG/ML ophthalmic solution Place 1 drop into the left eye 3 (three) times daily.  Marland Kitchen tropicamide (MYDRIACYL) 1 % ophthalmic solution Place 1 drop into the left eye 2 (two) times daily.   No current facility-administered medications for this visit.  (Ophthalmic Drugs)   Current Outpatient Medications (Other)  Medication Sig  . acyclovir  (ZOVIRAX) 400 MG tablet Take 1 tablet (400 mg total) by mouth daily. (Patient taking differently: Take 400 mg by mouth daily as needed (cold sores). )  . ARMOUR THYROID 60 MG tablet TAKE 1 TABLET DAILY (Patient taking differently: Take 60 mg by mouth daily before breakfast. )  . Ascorbic Acid (VITAMIN C) 1000 MG tablet Take 1,000 mg by mouth daily.    Marland Kitchen atorvastatin (LIPITOR) 10 MG tablet Take 10 mg by mouth at bedtime.   . cetirizine (ZYRTEC) 10 MG tablet Take 10 mg by mouth at bedtime as needed for allergies.   . Cholecalciferol (VITAMIN D) 1000 UNITS capsule Take 1,000 Units by mouth daily.    . Coenzyme Q10 100 MG capsule Take 100 mg by mouth daily.   . Cyanocobalamin (VITAMIN B-12 CR) 1500 MCG TBCR Take 1,500 mcg by mouth daily.   . fluticasone (FLONASE) 50 MCG/ACT nasal spray Place 1 spray into both nostrils daily as needed for allergies or rhinitis.  Marland Kitchen loratadine (CLARITIN) 10 MG tablet Take 10 mg by mouth daily as needed for allergies.   Marland Kitchen MAGNESIUM CITRATE PO Take 400 mg by mouth daily.   . tadalafil (CIALIS) 5 MG tablet Take 5 mg by mouth daily as needed for erectile dysfunction.   . vitamin E 400 UNIT capsule Take 400 Units by mouth daily.    . Zinc 50 MG CAPS Take 50 mg  by mouth daily.    No current facility-administered medications for this visit.  (Other)      REVIEW OF SYSTEMS: ROS    Positive for: Eyes   Negative for: Constitutional, Gastrointestinal, Neurological, Skin, Genitourinary, Musculoskeletal, HENT, Endocrine, Cardiovascular, Respiratory, Psychiatric, Allergic/Imm, Heme/Lymph   Last edited by Theodore Demark, COA on 03/08/2019  8:50 AM. (History)       ALLERGIES Allergies  Allergen Reactions  . Dust Mite Extract     UNSPECIFIED REACTION   . Grass Extracts [Gramineae Pollens]     UNSPECIFIED REACTION     PAST MEDICAL HISTORY Past Medical History:  Diagnosis Date  . Back pain   . Cancer [facility]) 2014   prostate cancer  . Cataracts, bilateral   .  Dysrhythmia    occasional PAC- asymptomatic, per MD pt cut back on caffeine  . Fungal nail infection   . Hearing loss    wear hearing aids  . HSV infection   . Hyperlipidemia   . Hypertension    no meds, dx yrs ago per patient but normal last several yrs  . Hypothyroidism   . Pneumonia    x 1   Past Surgical History:  Procedure Laterality Date  . ankle surgery trauma Right   . CATARACT EXTRACTION Left 11/29/2018   Dr. [name]  . COLONOSCOPY    . COSMETIC SURGERY     loose skin removed around neck area  . eye lid surgery    . EYE SURGERY    . fungal nail    . LASIK Bilateral   . PARS PLANA VITRECTOMY Left 03/01/2019   Procedure: PARS PLANA VITRECTOMY WITH 25 GAUGE;  Surgeon: [name], MD;  Location: [facility];  Service: Ophthalmology;  Laterality: Left;  . REMOVAL RETAINED LENS Left 03/01/2019   Procedure: REMOVAL OF RETAINED LENS MATERIALS LEFT EYE;  Surgeon: [name], MD;  Location: [place];  Service: Ophthalmology;  Laterality: Left;  . WISDOM TOOTH EXTRACTION      FAMILY HISTORY Family History  Problem Relation Age of Onset  . Heart disease Mother   . Liver disease Father   . Alcohol abuse Father   . Cancer Father        hepatic cancer ?primary  . Arthritis Sister   . COPD Sister     SOCIAL HISTORY Social History   Tobacco Use  . Smoking status: Former Smoker    Types: Cigarettes    Quit date: 07/05/1981    Years since quitting: 37.6  . Smokeless tobacco: Never Used  Substance Use Topics  . Alcohol use: Yes    Alcohol/week: 3.0 - 4.0 standard drinks    Types: 3 - 4 Glasses of wine per week  . Drug use: No         OPHTHALMIC EXAM:  Base Eye Exam    Visual Acuity (Snellen - Linear)      Right Left   Dist [place] 20/20 20/80 -2   Dist ph [place]  20/30 -2       Tonometry (Tonopen, 8:45 AM)      Right Left   Pressure def 12       Pupils      Dark Light Shape React APD   Right 3 2 Round Brisk None   Left 5  Round         Visual Fields (Counting  fingers)      Left Right    Full Full       Extraocular  Movement      Right Left    Full, Ortho Full, Ortho       Neuro/Psych    Oriented x3: Yes   Mood/Affect: Normal       Dilation    Left eye: 1.0% Mydriacyl, 2.5% Phenylephrine @ 8:45 AM        Slit Lamp and Fundus Exam    External Exam      Right Left   External  Periorbital Ecchymosis       Slit Lamp Exam      Right Left   Lids/Lashes Dermatochalasis - upper lid, Meibomian gland dysfunction Dermatochalasis - upper lid, Meibomian gland dysfunction, Telangiectasia, lower lid Ecchymosis   Conjunctiva/Sclera Trace nasal Pinguecula mild nasal pterygeium, sutures intact, 2+ Injection   Cornea Arcus, 1+ Punctate epithelial erosions, abrely visible lasik flap 2-3+ Punctate epithelial erosions, nasal pterygeium, lasik flap in good position with epithelial in growth at 0430,    Anterior Chamber Deep and quiet Deep, 0.5+pigment   Iris Round and dilated Round and moderately dilated to 5.63mm, mild TIDs at 0730-0800   Lens 2+ Nuclear sclerosis, 1-2+ Cortical cataract 3 piece sulcus IOL in good position and stable, no pseudophakodenisis; mild capsular phimosis and fibrosis   Vitreous Vitreous syneresis post vitrectomy, mild pigment, air bubble gone, retained lens material gone       Fundus Exam      Right Left   Disc  Pink and Sharp   C/D Ratio 0.2 0.2   Macula  Flat, Blunted foveal reflex, mild RPE changes, No heme or edema   Vessels  Mild Vascular attenuation, mild AV crossing changes   Periphery  Attached, pigmented CR atrophy at 1030, early laser changes          IMAGING AND PROCEDURES  Imaging and Procedures for @TODAY @  OCT, Retina - OU - Both Eyes       Right Eye Quality was good. Central Foveal Thickness: 321. Progression has been stable. Findings include abnormal foveal contour, no IRF, no SRF, epiretinal membrane, macular pucker.   Left Eye Quality was borderline. Central Foveal Thickness: 296. Progression  has been stable. Findings include normal foveal contour, no IRF, no SRF (Interval improvement in vitreous opacities).   Notes *Images captured and stored on drive  Diagnosis / Impression:  OD: mild ERM with macular pucker -- stable from prior OS: NFP, no IRF, no SRF   Clinical management:  See below  Abbreviations: NFP - Normal foveal profile. CME - cystoid macular edema. PED - pigment epithelial detachment. IRF - intraretinal fluid. SRF - subretinal fluid. EZ - ellipsoid zone. ERM - epiretinal membrane. ORA - outer retinal atrophy. ORT - outer retinal tubulation. SRHM - subretinal hyper-reflective material                 ASSESSMENT/PLAN:    ICD-10-CM   1. Retained lens material following cataract surgery of left eye  H59.022   2. Ocular hypertension, left  H40.052   3. Epiretinal membrane (ERM) of right eye  H35.371   4. Retinal edema  H35.81 OCT, Retina - OU - Both Eyes  5. Essential hypertension  I10   6. Hypertensive retinopathy of both eyes  H35.033   7. Combined forms of age-related cataract of right eye  H25.811   8. Pseudophakia  Z96.1     1,2. Retained Lens Material with ocular hypertension OS  - complicated CE + sulcus IOL OS (5.27.20, Shapiro) -- posterior capsule rupture and anterior vitrectomy  -  sulcus IOL in excellent position -- stable without pseudophakodonesis; +capsular phimosis/fibrosis  - small cortical fragments in vitreous persist but are disintegrating; small piece in inferior angle resolved  - tMax 42 OS on POD2 in Dr. Kellie Moor office -- improved to 22 on [place]bigan  - now POW1 s/p PPV/EL/FAX OS, 08.27.20             - doing well   - retained lens material gone             - IOP okay at 12  - air bubble gone             - decrease Durezol 4x/day OS                         zymaxid QID OS -- stop when bottle runs out                         Atropine QDaily OS-- okay to stop                         Brimonidine BID OS -- okay to stop                          PSO ung QID OS             - post op drop instructions reviewed             - tylenol/ibuprofen for pain  - f/u 2 weeks -- POV, DFE, OCT  3,4. Epiretinal membrane, right eye  - mild ERM  - asymptomatic, no metamorphopsia  - no indication for surgery at this time  - monitor  5,6. Hypertensive retinopathy OU  - discussed importance of tight BP control  - monitor  7. Mixed form age related cataract OD  - under the expert management of Dr. [name]  - surgery OD has been postponed due to issues OS  8. Pseudophakia OS  - s/p CE + sulcus IOL (5.27.20, Dr. [name])  - IOL in good position  - retained lens material as above   Ophthalmic Meds Ordered this visit:  No orders of the defined types were placed in this encounter.      Return in about 2 weeks (around 03/22/2019) for POV, retained lens material OS, DFE, OCT.  There are no Patient Instructions on file for this visit.   Explained the diagnoses, plan, and follow up with the patient and they expressed understanding.  Patient expressed understanding of the importance of proper follow up care.   This document serves as a record of services personally performed by Gardiner Sleeper, MD, PhD. It was created on their behalf by Ernest Mallick, OA, an ophthalmic assistant. The creation of this record is the provider's dictation and/or activities during the visit.    Electronically signed by: Ernest Mallick, OA  09.02.2020 2:40 PM    Gardiner Sleeper, M.D., Ph.D. Diseases & Surgery of the Retina and Vitreous [facility]  I have reviewed the above documentation for accuracy and completeness, and I agree with the above. Gardiner Sleeper, M.D., Ph.D. 03/08/19 2:40 PM    Abbreviations: M myopia (nearsighted); A astigmatism; H hyperopia (farsighted); P presbyopia; Mrx spectacle prescription;  CTL contact lenses; OD right eye; OS left eye; OU both eyes  XT exotropia; ET esotropia; PEK punctate epithelial  keratitis; PEE punctate epithelial erosions; DES dry eye syndrome; MGD meibomian gland dysfunction; ATs artificial tears; PFAT's preservative free artificial tears; [place] nuclear sclerotic cataract; PSC posterior subcapsular cataract; ERM epi-retinal membrane; PVD posterior vitreous detachment; RD retinal detachment; DM diabetes mellitus; DR diabetic retinopathy; NPDR non-proliferative diabetic retinopathy; PDR proliferative diabetic retinopathy; CSME clinically significant macular edema; DME diabetic macular edema; dbh dot blot hemorrhages; CWS cotton wool spot; POAG primary open angle glaucoma; C/D cup-to-disc ratio; HVF humphrey visual field; GVF goldmann visual field; OCT optical coherence tomography; IOP intraocular pressure; BRVO Branch retinal vein occlusion; CRVO central retinal vein occlusion; CRAO central retinal artery occlusion; BRAO branch retinal artery occlusion; RT retinal tear; SB scleral buckle; PPV pars plana vitrectomy; VH Vitreous hemorrhage; PRP panretinal laser photocoagulation; IVK intravitreal kenalog; VMT vitreomacular traction; MH Macular hole;  NVD neovascularization of the disc; NVE neovascularization elsewhere; AREDS age related eye disease study; ARMD age related macular degeneration; POAG primary open angle glaucoma; EBMD epithelial/anterior basement membrane dystrophy; ACIOL anterior chamber intraocular lens; IOL intraocular lens; PCIOL posterior chamber intraocular lens; Phaco/IOL phacoemulsification with intraocular lens placement; [place] photorefractive keratectomy; LASIK laser assisted in situ keratomileusis; HTN hypertension; DM diabetes mellitus; COPD chronic obstructive pulmonary disease

## 2019-03-08 ENCOUNTER — Ambulatory Visit (INDEPENDENT_AMBULATORY_CARE_PROVIDER_SITE_OTHER): Payer: Medicare Other | Admitting: Ophthalmology

## 2019-03-08 ENCOUNTER — Encounter (INDEPENDENT_AMBULATORY_CARE_PROVIDER_SITE_OTHER): Payer: Self-pay | Admitting: Ophthalmology

## 2019-03-08 ENCOUNTER — Other Ambulatory Visit: Payer: Self-pay

## 2019-03-08 DIAGNOSIS — Z961 Presence of intraocular lens: Secondary | ICD-10-CM | POA: Diagnosis not present

## 2019-03-08 DIAGNOSIS — H35371 Puckering of macula, right eye: Secondary | ICD-10-CM | POA: Diagnosis not present

## 2019-03-08 DIAGNOSIS — H59022 Cataract (lens) fragments in eye following cataract surgery, left eye: Secondary | ICD-10-CM | POA: Diagnosis not present

## 2019-03-08 DIAGNOSIS — H3581 Retinal edema: Secondary | ICD-10-CM

## 2019-03-08 DIAGNOSIS — H40052 Ocular hypertension, left eye: Secondary | ICD-10-CM

## 2019-03-08 DIAGNOSIS — H35033 Hypertensive retinopathy, bilateral: Secondary | ICD-10-CM

## 2019-03-08 DIAGNOSIS — I10 Essential (primary) hypertension: Secondary | ICD-10-CM | POA: Diagnosis not present

## 2019-03-08 DIAGNOSIS — H25811 Combined forms of age-related cataract, right eye: Secondary | ICD-10-CM

## 2019-03-22 NOTE — Progress Notes (Signed)
[facility] Note  03/23/2019     CHIEF COMPLAINT Patient presents for Post-op Follow-up   HISTORY OF PRESENT ILLNESS: Ryan [name] is a 73 y.o. male who presents to the clinic today for:   HPI    Post-op Follow-up    In left eye.  Discomfort includes itching, foreign body sensation and floaters.  Negative for pain, tearing and discharge.  Vision is improved.  I, the attending physician,  performed the HPI with the patient and updated documentation appropriately.          Comments    POV OS(03/01/19)  #3 Patient states his vision is gradually getting better, he has occasional floaters ,denies ocular pain and flashes.       Last edited by [name], MD on 03/23/2019 12:29 PM. (History)    pt states his vision is getting better, he states the pain and scratchiness is almost gone, pt states he is using Durezol QID, PSO ung before bedtime and he finished zymaxid yesterday, he states his vision is "blurred"   Referring physician: [name], MD [place],  [place] [id number]  HISTORICAL INFORMATION:   Selected notes from the MEDICAL RECORD NUMBER Referred by Dr. Rutherford Guys for concern of possible endophthalmitis OS LEE: 05.29.20 [name]) Ocular Hx-s/p cat sx 05.27.20 PMH-   CURRENT MEDICATIONS: Current Outpatient Medications (Ophthalmic Drugs)  Medication Sig  . brimonidine (ALPHAGAN) 0.2 % ophthalmic solution Place 1 drop into the left eye 3 (three) times daily.  . Difluprednate (DUREZOL) 0.05 % EMUL Place 1 drop into the left eye 4 (four) times daily.  . dorzolamide-timolol (COSOPT) 22.3-6.8 MG/ML ophthalmic solution Place 1 drop into the left eye 3 (three) times daily.  Marland Kitchen tropicamide (MYDRIACYL) 1 % ophthalmic solution Place 1 drop into the left eye 2 (two) times daily.   No current facility-administered medications for this visit.  (Ophthalmic Drugs)   Current Outpatient Medications (Other)  Medication  Sig  . acyclovir (ZOVIRAX) 400 MG tablet Take 1 tablet (400 mg total) by mouth daily. (Patient taking differently: Take 400 mg by mouth daily as needed (cold sores). )  . ARMOUR THYROID 60 MG tablet TAKE 1 TABLET DAILY (Patient taking differently: Take 60 mg by mouth daily before breakfast. )  . Ascorbic Acid (VITAMIN C) 1000 MG tablet Take 1,000 mg by mouth daily.    Marland Kitchen atorvastatin (LIPITOR) 10 MG tablet Take 10 mg by mouth at bedtime.   . cetirizine (ZYRTEC) 10 MG tablet Take 10 mg by mouth at bedtime as needed for allergies.   . Cholecalciferol (VITAMIN D) 1000 UNITS capsule Take 1,000 Units by mouth daily.    . Coenzyme Q10 100 MG capsule Take 100 mg by mouth daily.   . Cyanocobalamin (VITAMIN B-12 CR) 1500 MCG TBCR Take 1,500 mcg by mouth daily.   . fluticasone (FLONASE) 50 MCG/ACT nasal spray Place 1 spray into both nostrils daily as needed for allergies or rhinitis.  Marland Kitchen loratadine (CLARITIN) 10 MG tablet Take 10 mg by mouth daily as needed for allergies.   Marland Kitchen MAGNESIUM CITRATE PO Take 400 mg by mouth daily.   . tadalafil (CIALIS) 5 MG tablet Take 5 mg by mouth daily as needed for erectile dysfunction.   . vitamin E 400 UNIT capsule Take 400 Units by mouth daily.    . Zinc 50 MG CAPS Take 50 mg by mouth daily.    No current facility-administered medications for this visit.  (  Other)      REVIEW OF SYSTEMS: ROS    Positive for: Eyes   Negative for: Constitutional, Gastrointestinal, Neurological, Skin, Genitourinary, Musculoskeletal, HENT, Endocrine, Cardiovascular, Respiratory, Psychiatric, Allergic/Imm, Heme/Lymph   Last edited by Zenovia Jordan, LPN on [id number] AM. (History)       ALLERGIES Allergies  Allergen Reactions  . Dust Mite Extract     UNSPECIFIED REACTION   . Grass Extracts [Gramineae Pollens]     UNSPECIFIED REACTION     PAST MEDICAL HISTORY Past Medical History:  Diagnosis Date  . Back pain   . Cancer [facility]) 2014   prostate cancer  . Cataracts,  bilateral   . Dysrhythmia    occasional PAC- asymptomatic, per MD pt cut back on caffeine  . Fungal nail infection   . Hearing loss    wear hearing aids  . HSV infection   . Hyperlipidemia   . Hypertension    no meds, dx yrs ago per patient but normal last several yrs  . Hypothyroidism   . Pneumonia    x 1   Past Surgical History:  Procedure Laterality Date  . ankle surgery trauma Right   . CATARACT EXTRACTION Left 11/29/2018   Dr. [name]  . COLONOSCOPY    . COSMETIC SURGERY     loose skin removed around neck area  . eye lid surgery    . EYE SURGERY    . fungal nail    . LASIK Bilateral   . PARS PLANA VITRECTOMY Left 03/01/2019   Procedure: PARS PLANA VITRECTOMY WITH 25 GAUGE;  Surgeon: [name], MD;  Location: [place];  Service: Ophthalmology;  Laterality: Left;  . REMOVAL RETAINED LENS Left 03/01/2019   Procedure: REMOVAL OF RETAINED LENS MATERIALS LEFT EYE;  Surgeon: [name], MD;  Location: [place];  Service: Ophthalmology;  Laterality: Left;  . WISDOM TOOTH EXTRACTION      FAMILY HISTORY Family History  Problem Relation Age of Onset  . Heart disease Mother   . Liver disease Father   . Alcohol abuse Father   . Cancer Father        hepatic cancer ?primary  . Arthritis Sister   . COPD Sister     SOCIAL HISTORY Social History   Tobacco Use  . Smoking status: Former Smoker    Types: Cigarettes    Quit date: 07/05/1981    Years since quitting: 37.7  . Smokeless tobacco: Never Used  Substance Use Topics  . Alcohol use: Yes    Alcohol/week: 3.0 - 4.0 standard drinks    Types: 3 - 4 Glasses of wine per week  . Drug use: No         OPHTHALMIC EXAM:  Base Eye Exam    Visual Acuity (Snellen - Linear)      Right Left   Dist [place] 20/20 -1 20/150 -1   Dist ph [place] 20/20 20/30       Tonometry (Tonopen, 10:19 AM)      Right Left   Pressure 12 18       Pupils      Dark Light Shape React APD   Right 3 2 Round Brisk None   Left 4  Round          Visual Fields      Left Right   Restrictions Partial outer superior temporal, inferior temporal, superior nasal deficiencies        Extraocular Movement      Right Left  Full, Ortho Full, Ortho       Neuro/Psych    Oriented x3: Yes   Mood/Affect: Normal       Dilation    Both eyes: 1.0% Mydriacyl, 2.5% Phenylephrine @ 10:15 AM        Slit Lamp and Fundus Exam    External Exam      Right Left   External  Periorbital Ecchymosis       Slit Lamp Exam      Right Left   Lids/Lashes Dermatochalasis - upper lid, Meibomian gland dysfunction Dermatochalasis - upper lid   Conjunctiva/Sclera Trace nasal Pinguecula mild nasal pterygeium, sutures intact   Cornea Arcus, 1+ Punctate epithelial erosions, abrely visible lasik flap 2+ Punctate epithelial erosions, nasal pterygeium with +NV, lasik flap in good position with epithelial in growth at 0430, dry tear film   Anterior Chamber Deep and quiet Deep and quiet   Iris Round and dilated Round and dilated, mild TIDs at 0730-0800   Lens 2+ Nuclear sclerosis, 1-2+ Cortical cataract 3 piece sulcus IOL in good position and stable   Vitreous Vitreous syneresis post vitrectomy, clear, lens remnants gone       Fundus Exam      Right Left   Disc Pink and Sharp Pink and Sharp   C/D Ratio 0.2 0.2   Macula Flat, Blunted foveal reflex, Retinal pigment epithelial mottling, No heme or edema Flat, good foveal reflex, mild RPE changes, No heme or edema   Vessels Mild Vascular attenuation Mild Vascular attenuation, mild AV crossing changes   Periphery Attached    Attached, pigmented CR atrophy at 1030, good laser changes          IMAGING AND PROCEDURES  Imaging and Procedures for @TODAY @  OCT, Retina - OU - Both Eyes       Right Eye Quality was good. Central Foveal Thickness: 311. Progression has been stable. Findings include abnormal foveal contour, no IRF, no SRF, epiretinal membrane, macular pucker.   Left Eye Quality was good. Central  Foveal Thickness: 309. Progression has been stable. Findings include normal foveal contour, no IRF, no SRF (Interval improvement in vitreous opacities).   Notes *Images captured and stored on drive  Diagnosis / Impression:  OD: mild ERM with macular pucker -- stable from prior OS: NFP, no IRF, no SRF   Clinical management:  See below  Abbreviations: NFP - Normal foveal profile. CME - cystoid macular edema. PED - pigment epithelial detachment. IRF - intraretinal fluid. SRF - subretinal fluid. EZ - ellipsoid zone. ERM - epiretinal membrane. ORA - outer retinal atrophy. ORT - outer retinal tubulation. SRHM - subretinal hyper-reflective material                 ASSESSMENT/PLAN:    ICD-10-CM   1. Retained lens material following cataract surgery of left eye  H59.022   2. Ocular hypertension, left  H40.052   3. Epiretinal membrane (ERM) of right eye  H35.371   4. Retinal edema  H35.81 OCT, Retina - OU - Both Eyes  5. Essential hypertension  I10   6. Hypertensive retinopathy of both eyes  H35.033   7. Combined forms of age-related cataract of right eye  H25.811   8. Pseudophakia  Z96.1     1,2. Retained Lens Material with ocular hypertension OS  - complicated CE + sulcus IOL OS (5.27.20, Shapiro) -- posterior capsule rupture and anterior vitrectomy  - sulcus IOL in excellent position -- stable without pseudophakodonesis; +capsular phimosis/fibrosis  -  pre-op: persistent small cortical fragments in vitreous; small piece in inferior angle resolved  - tMax 42 OS on POD2 in Dr. Kellie Moor office -- improved to 22 on [place]  - now POW3 s/p PPV/EL/FAX OS, 08.27.20             - doing well -- BCVA 20/30 -- limited by mild corneal dryness  - retained lens material gone             - IOP okay at 18             - start Durezol taper -- 3,2,1 drops daily, decrease weekly OS  - decrease PSO ung to bedtime only OS             - post op drop instructions reviewed             -  tylenol/ibuprofen for pain  - f/u 6 weeks -- POV, DFE, OCT  3,4. Epiretinal membrane, right eye  - mild ERM  - asymptomatic, no metamorphopsia  - no indication for surgery at this time  - monitor  5,6. Hypertensive retinopathy OU  - discussed importance of tight BP control  - monitor  7. Mixed form age related cataract OD  - under the expert management of Dr. [name]  - surgery OD has been postponed pending post op course OS  8. Pseudophakia OS  - s/p CE + sulcus IOL (5.27.20, Dr. [name])  - IOL in good position  - retained lens material removed as above   Ophthalmic Meds Ordered this visit:  No orders of the defined types were placed in this encounter.      Return in about 6 weeks (around 05/04/2019) for retained lens material with ocular HTN OS, DFE, OCT.  There are no Patient Instructions on file for this visit.   Explained the diagnoses, plan, and follow up with the patient and they expressed understanding.  Patient expressed understanding of the importance of proper follow up care.   This document serves as a record of services personally performed by Gardiner Sleeper, MD, PhD. It was created on their behalf by Ernest Mallick, OA, an ophthalmic assistant. The creation of this record is the provider's dictation and/or activities during the visit.    Electronically signed by: Ernest Mallick, OA  09.17.2020 12:52 PM      Gardiner Sleeper, M.D., Ph.D. Diseases & Surgery of the Retina and Vitreous [facility]  I have reviewed the above documentation for accuracy and completeness, and I agree with the above. Gardiner Sleeper, M.D., Ph.D. 03/23/19 12:52 PM   Abbreviations: M myopia (nearsighted); A astigmatism; H hyperopia (farsighted); P presbyopia; Mrx spectacle prescription;  CTL contact lenses; OD right eye; OS left eye; OU both eyes  XT exotropia; ET esotropia; PEK punctate epithelial keratitis; PEE punctate epithelial erosions; DES dry eye  syndrome; MGD meibomian gland dysfunction; ATs artificial tears; PFAT's preservative free artificial tears; [place] nuclear sclerotic cataract; PSC posterior subcapsular cataract; ERM epi-retinal membrane; PVD posterior vitreous detachment; RD retinal detachment; DM diabetes mellitus; DR diabetic retinopathy; NPDR non-proliferative diabetic retinopathy; PDR proliferative diabetic retinopathy; CSME clinically significant macular edema; DME diabetic macular edema; dbh dot blot hemorrhages; CWS cotton wool spot; POAG primary open angle glaucoma; C/D cup-to-disc ratio; HVF humphrey visual field; GVF goldmann visual field; OCT optical coherence tomography; IOP intraocular pressure; BRVO Branch retinal vein occlusion; CRVO central retinal vein occlusion; CRAO central retinal artery occlusion; BRAO branch retinal artery occlusion; RT  retinal tear; SB scleral buckle; PPV pars plana vitrectomy; VH Vitreous hemorrhage; PRP panretinal laser photocoagulation; IVK intravitreal kenalog; VMT vitreomacular traction; MH Macular hole;  NVD neovascularization of the disc; NVE neovascularization elsewhere; AREDS age related eye disease study; ARMD age related macular degeneration; POAG primary open angle glaucoma; EBMD epithelial/anterior basement membrane dystrophy; ACIOL anterior chamber intraocular lens; IOL intraocular lens; PCIOL posterior chamber intraocular lens; Phaco/IOL phacoemulsification with intraocular lens placement; PRK photorefractive keratectomy; LASIK laser assisted in situ keratomileusis; HTN hypertension; DM diabetes mellitus; COPD chronic obstructive pulmonary disease  

## 2019-03-23 ENCOUNTER — Other Ambulatory Visit: Payer: Self-pay

## 2019-03-23 ENCOUNTER — Ambulatory Visit (INDEPENDENT_AMBULATORY_CARE_PROVIDER_SITE_OTHER): Payer: Medicare Other | Admitting: Ophthalmology

## 2019-03-23 ENCOUNTER — Encounter (INDEPENDENT_AMBULATORY_CARE_PROVIDER_SITE_OTHER): Payer: Self-pay | Admitting: Ophthalmology

## 2019-03-23 DIAGNOSIS — H35033 Hypertensive retinopathy, bilateral: Secondary | ICD-10-CM | POA: Diagnosis not present

## 2019-03-23 DIAGNOSIS — H25811 Combined forms of age-related cataract, right eye: Secondary | ICD-10-CM | POA: Diagnosis not present

## 2019-03-23 DIAGNOSIS — H40052 Ocular hypertension, left eye: Secondary | ICD-10-CM

## 2019-03-23 DIAGNOSIS — H59022 Cataract (lens) fragments in eye following cataract surgery, left eye: Secondary | ICD-10-CM | POA: Diagnosis not present

## 2019-03-23 DIAGNOSIS — H35371 Puckering of macula, right eye: Secondary | ICD-10-CM

## 2019-03-23 DIAGNOSIS — Z961 Presence of intraocular lens: Secondary | ICD-10-CM

## 2019-03-23 DIAGNOSIS — H3581 Retinal edema: Secondary | ICD-10-CM

## 2019-03-23 DIAGNOSIS — I10 Essential (primary) hypertension: Secondary | ICD-10-CM

## 2019-03-27 DIAGNOSIS — Z23 Encounter for immunization: Secondary | ICD-10-CM | POA: Diagnosis not present

## 2019-03-31 ENCOUNTER — Ambulatory Visit: Payer: Medicare Other

## 2019-04-09 DIAGNOSIS — H2511 Age-related nuclear cataract, right eye: Secondary | ICD-10-CM | POA: Diagnosis not present

## 2019-04-09 DIAGNOSIS — H0102B Squamous blepharitis left eye, upper and lower eyelids: Secondary | ICD-10-CM | POA: Diagnosis not present

## 2019-04-09 DIAGNOSIS — H40012 Open angle with borderline findings, low risk, left eye: Secondary | ICD-10-CM | POA: Diagnosis not present

## 2019-04-09 DIAGNOSIS — H11042 Peripheral pterygium, stationary, left eye: Secondary | ICD-10-CM | POA: Diagnosis not present

## 2019-04-09 DIAGNOSIS — H2589 Other age-related cataract: Secondary | ICD-10-CM | POA: Diagnosis not present

## 2019-04-09 DIAGNOSIS — H0102A Squamous blepharitis right eye, upper and lower eyelids: Secondary | ICD-10-CM | POA: Diagnosis not present

## 2019-04-09 DIAGNOSIS — Z961 Presence of intraocular lens: Secondary | ICD-10-CM | POA: Diagnosis not present

## 2019-04-09 DIAGNOSIS — Z9889 Other specified postprocedural states: Secondary | ICD-10-CM | POA: Diagnosis not present

## 2019-04-10 NOTE — Progress Notes (Addendum)
[facility] Note  04/11/2019     CHIEF COMPLAINT Patient presents for Eye Problem (Elevated IOP OS)   HISTORY OF PRESENT ILLNESS: Ryan [name] is a 73 y.o. male who presents to the clinic today for:   HPI    Eye Problem     Additional comments: Elevated IOP OS          Comments    Pt was not supposed to return to [place] for a couple of wks, but pt saw Dr. Annia Belt at Dr. Zenia Resides office 2 days ago for eval of pterygium OS.  Dr. Patrice Paradise decided pterygium had not grown in about a yr, so no sx is needed at this time.  At the visit though, pt's IOP OS was 35, and Dr. Patrice Paradise was concerned about psuedoexfoliation GLC OS, and referred pt back to [place].  Pt's VA OS is both good and bad throughout the day, typically getting blurrier as the day goes on.  Denies pain, floaters, but reports intermittent "flickers" OS that typically align with his pulse.  No problems reported OD.  Dr. Patrice Paradise changed pt's gtt regiment, and pt has been on new gtts for 2 days.  Dorzolamide-Timolol BID OS Brimonidine BID OS PF QD OS Ilevro QD OS        Last edited by [name], MD on 04/11/2019  1:04 PM. (History)    pt states he saw Dr. Patrice Paradise on Monday for the pterygium on his left eye and when she checked his pressure it was 35, pt was put on Cosopt BID, brimonidine BID, PF QID and Ilevero QD in his left eye only, pts next appt with Dr. Patrice Paradise is next year, pt states 3 or 4 times since   Referring physician: Georgeann Oppenheim, MD [place] [address],  [place] [address]  HISTORICAL INFORMATION:   Selected notes from the MEDICAL RECORD NUMBER Referred by Dr. Rutherford Guys for concern of possible endophthalmitis OS LEE: 05.29.20 [name]) Ocular Hx-s/p cat sx 05.27.20 PMH-   CURRENT MEDICATIONS: Current Outpatient Medications (Ophthalmic Drugs)  Medication Sig  . brimonidine (ALPHAGAN) 0.2 % ophthalmic solution Place 1 drop into the left eye 3 (three) times daily.  .  dorzolamide-timolol (COSOPT) 22.3-6.8 MG/ML ophthalmic solution Place 1 drop into the left eye 3 (three) times daily.  . Difluprednate (DUREZOL) 0.05 % EMUL Place 1 drop into the left eye 4 (four) times daily. (Patient not taking: Reported on 04/11/2019)  . tropicamide (MYDRIACYL) 1 % ophthalmic solution Place 1 drop into the left eye 2 (two) times daily.   No current facility-administered medications for this visit.  (Ophthalmic Drugs)   Current Outpatient Medications (Other)  Medication Sig  . acyclovir (ZOVIRAX) 400 MG tablet Take 1 tablet (400 mg total) by mouth daily. (Patient taking differently: Take 400 mg by mouth daily as needed (cold sores). )  . ARMOUR THYROID 60 MG tablet TAKE 1 TABLET DAILY (Patient taking differently: Take 60 mg by mouth daily before breakfast. )  . Ascorbic Acid (VITAMIN C) 1000 MG tablet Take 1,000 mg by mouth daily.    Marland Kitchen atorvastatin (LIPITOR) 10 MG tablet Take 10 mg by mouth at bedtime.   . cetirizine (ZYRTEC) 10 MG tablet Take 10 mg by mouth at bedtime as needed for allergies.   . Cholecalciferol (VITAMIN D) 1000 UNITS capsule Take 1,000 Units by mouth daily.    . Coenzyme Q10 100 MG capsule Take 100 mg by mouth daily.   Marland Kitchen  Cyanocobalamin (VITAMIN B-12 CR) 1500 MCG TBCR Take 1,500 mcg by mouth daily.   . fluticasone (FLONASE) 50 MCG/ACT nasal spray Place 1 spray into both nostrils daily as needed for allergies or rhinitis.  Marland Kitchen loratadine (CLARITIN) 10 MG tablet Take 10 mg by mouth daily as needed for allergies.   Marland Kitchen MAGNESIUM CITRATE PO Take 400 mg by mouth daily.   . tadalafil (CIALIS) 5 MG tablet Take 5 mg by mouth daily as needed for erectile dysfunction.   . vitamin E 400 UNIT capsule Take 400 Units by mouth daily.    . Zinc 50 MG CAPS Take 50 mg by mouth daily.    No current facility-administered medications for this visit.  (Other)      REVIEW OF SYSTEMS: ROS    Positive for: Eyes   Negative for: Constitutional, Gastrointestinal, Neurological,  Skin, Genitourinary, Musculoskeletal, HENT, Endocrine, Cardiovascular, Respiratory, Psychiatric, Allergic/Imm, Heme/Lymph   Last edited by Matthew Folks, COA on 04/11/2019 10:20 AM. (History)       ALLERGIES Allergies  Allergen Reactions  . Dust Mite Extract     UNSPECIFIED REACTION   . Grass Extracts [Gramineae Pollens]     UNSPECIFIED REACTION     PAST MEDICAL HISTORY Past Medical History:  Diagnosis Date  . Back pain   . Cancer [facility]) 2014   prostate cancer  . Cataracts, bilateral   . Dysrhythmia    occasional PAC- asymptomatic, per MD pt cut back on caffeine  . Fungal nail infection   . Hearing loss    wear hearing aids  . HSV infection   . Hyperlipidemia   . Hypertension    no meds, dx yrs ago per patient but normal last several yrs  . Hypertensive retinopathy    OU  . Hypothyroidism   . Pneumonia    x 1   Past Surgical History:  Procedure Laterality Date  . ankle surgery trauma Right   . CATARACT EXTRACTION Left 11/29/2018   Dr. [name]  . COLONOSCOPY    . COSMETIC SURGERY     loose skin removed around neck area  . eye lid surgery    . EYE SURGERY    . fungal nail    . LASIK Bilateral   . PARS PLANA VITRECTOMY Left 03/01/2019   Procedure: PARS PLANA VITRECTOMY WITH 25 GAUGE;  Surgeon: [name], MD;  Location: [facility];  Service: Ophthalmology;  Laterality: Left;  . REMOVAL RETAINED LENS Left 03/01/2019   Procedure: REMOVAL OF RETAINED LENS MATERIALS LEFT EYE;  Surgeon: [name], MD;  Location: [facility];  Service: Ophthalmology;  Laterality: Left;  . WISDOM TOOTH EXTRACTION      FAMILY HISTORY Family History  Problem Relation Age of Onset  . Heart disease Mother   . Liver disease Father   . Alcohol abuse Father   . Cancer Father        hepatic cancer ?primary  . Arthritis Sister   . COPD Sister     SOCIAL HISTORY Social History   Tobacco Use  . Smoking status: Former Smoker    Types: Cigarettes    Quit date: 07/05/1981    Years since  quitting: 37.7  . Smokeless tobacco: Never Used  Substance Use Topics  . Alcohol use: Yes    Alcohol/week: 3.0 - 4.0 standard drinks    Types: 3 - 4 Glasses of wine per week  . Drug use: No         OPHTHALMIC EXAM:  Base Eye  Exam    Visual Acuity (Snellen - Linear)      Right Left   Dist [place] 20/20 20/30   Dist ph [place]  20/25       Tonometry (Tonopen, 10:21 AM)      Right Left   Pressure 11 7       Pupils      Dark Light Shape React APD   Right 3 2 Round Brisk None   Left 4 4 Irregular Minimal None       Visual Fields (Counting fingers)      Left Right    Full Full       Extraocular Movement      Right Left    Full, Ortho Full, Ortho       Neuro/Psych    Oriented x3: Yes   Mood/Affect: Normal       Dilation    Both eyes: 1.0% Mydriacyl, 2.5% Phenylephrine @ 10:22 AM        Slit Lamp and Fundus Exam    External Exam      Right Left   External  Periorbital Ecchymosis       Slit Lamp Exam      Right Left   Lids/Lashes Dermatochalasis - upper lid, Meibomian gland dysfunction Dermatochalasis - upper lid   Conjunctiva/Sclera Trace nasal Pinguecula mild nasal pterygeium, sutures intact   Cornea Arcus, 1+ Punctate epithelial erosions, abrely visible lasik flap 2+ Punctate epithelial erosions, nasal pterygeium with +NV, lasik flap in good position with epithelial in growth at 0430, dry tear film   Anterior Chamber Deep and quiet Deep and quiet   Iris Round and dilated Round and dilated, mild TIDs at 0730-0800   Lens 2+ Nuclear sclerosis, 1-2+ Cortical cataract 3 piece sulcus IOL in good position and stable   Vitreous Vitreous syneresis post vitrectomy, clear, lens remnants gone       Fundus Exam      Right Left   Disc Pink and Sharp Pink and Sharp   C/D Ratio 0.2 0.2   Macula Flat, Blunted foveal reflex, Retinal pigment epithelial mottling, No heme or edema Flat, good foveal reflex, mild RPE changes, No heme or edema   Vessels Mild Vascular attenuation  Mild Vascular attenuation, mild AV crossing changes   Periphery Attached    Attached, pigmented CR atrophy at 1030, good laser changes          IMAGING AND PROCEDURES  Imaging and Procedures for @TODAY @  OCT, Retina - OU - Both Eyes       Right Eye Quality was good. Central Foveal Thickness: 310. Progression has been stable. Findings include abnormal foveal contour, no IRF, no SRF, epiretinal membrane, macular pucker.   Left Eye Quality was good. Central Foveal Thickness: 309. Progression has been stable. Findings include normal foveal contour, no IRF, no SRF (Interval improvement in vitreous opacities).   Notes *Images captured and stored on drive  Diagnosis / Impression:  OD: mild ERM with macular pucker -- stable from prior OS: NFP, no IRF, no SRF   Clinical management:  See below  Abbreviations: NFP - Normal foveal profile. CME - cystoid macular edema. PED - pigment epithelial detachment. IRF - intraretinal fluid. SRF - subretinal fluid. EZ - ellipsoid zone. ERM - epiretinal membrane. ORA - outer retinal atrophy. ORT - outer retinal tubulation. SRHM - subretinal hyper-reflective material                 ASSESSMENT/PLAN:    ICD-10-CM  1. Retained lens material following cataract surgery of left eye  H59.022   2. Ocular hypertension, left  H40.052   3. Epiretinal membrane (ERM) of right eye  H35.371   4. Retinal edema  H35.81 OCT, Retina - OU - Both Eyes  5. Essential hypertension  I10   6. Hypertensive retinopathy of both eyes  H35.033   7. Combined forms of age-related cataract of right eye  H25.811   8. Pseudophakia  Z96.1   9. Ocular hypertension of left eye  H40.052     1,2. Retained Lens Material with ocular hypertension OS  - complicated CE + sulcus IOL OS (5.27.20, Shapiro) -- posterior capsule rupture and anterior vitrectomy  - sulcus IOL in excellent position -- stable without pseudophakodonesis; +capsular phimosis/fibrosis  - of note, per Dr.  Towanda Malkin report, pt had a history of pseudoexfoliation syndrome OS  - pre-op: persistent small cortical fragments in vitreous; small piece in inferior angle resolved  - tMax 42 OS on POD2 in Dr. Kellie Moor office -- improved to 22 on Combigan  - s/p PPV/EL/FAX OS, 08.27.20             - doing well -- BCVA 20/25 -- limited by mild corneal dryness  - retained lens material gone             - IOP okay at 7             - cont PF taper -- 1 drop daily, decrease weekly OS -- was switched from Durezol by Dr. Patrice Paradise  - f/u as scheduled -- POV, DFE, OCT, check gonio  3,4. Epiretinal membrane, right eye  - mild ERM  - asymptomatic, no metamorphopsia  - no indication for surgery at this time  - monitor  5,6. Hypertensive retinopathy OU  - discussed importance of tight BP control  - monitor  7. Mixed form age related cataract OD  - under the expert management of Dr. [name]  - surgery OD has been postponed pending post op course OS  8. Pseudophakia OS  - s/p CE + sulcus IOL (5.27.20, Dr. [name])  - IOL in good position  - retained lens material as above  9. Ocular Hypertension OS  - IOP 35 OS noted at Dr. Towanda Malkin office on Monday (10.05.20)   - IOP improved to 7 today -- ?Durezol steroid response?  - started on Cosopt BID OS -- okay to stop per Dr. Coralyn Pear  - brimonidine BID OS  - PF QD OS   - Ilevro QD OS  - of note, per Dr. Towanda Malkin report, pt had a history of pseudoexfoliation syndrome OS  - will f/u as scheduled and check gonio for pseuoexfoliation material in TM   Ophthalmic Meds Ordered this visit:  No orders of the defined types were placed in this encounter.      Return for as scheduled.  There are no Patient Instructions on file for this visit.   Explained the diagnoses, plan, and follow up with the patient and they expressed understanding.  Patient expressed understanding of the importance of proper follow up care.   This document serves as a record of services  personally performed by Gardiner Sleeper, MD, PhD. It was created on their behalf by Roselee Nova, COMT. The creation of this record is the provider's dictation and/or activities during the visit.  Electronically signed by: Roselee Nova, COMT 04/11/19 1:12 PM  Gardiner Sleeper, M.D., Ph.D. Diseases & Surgery of the Retina and Vitreous [facility]  [place] 04/11/19  I have reviewed the above documentation for accuracy and completeness, and I agree with the above. Gardiner Sleeper, M.D., Ph.D. 04/11/19 1:12 PM    Abbreviations: M myopia (nearsighted); A astigmatism; H hyperopia (farsighted); P presbyopia; Mrx spectacle prescription;  CTL contact lenses; OD right eye; OS left eye; OU both eyes  XT exotropia; ET esotropia; PEK punctate epithelial keratitis; PEE punctate epithelial erosions; DES dry eye syndrome; MGD meibomian gland dysfunction; ATs artificial tears; PFAT's preservative free artificial tears; Reubens nuclear sclerotic cataract; PSC posterior subcapsular cataract; ERM epi-retinal membrane; PVD posterior vitreous detachment; RD retinal detachment; DM diabetes mellitus; DR diabetic retinopathy; NPDR non-proliferative diabetic retinopathy; PDR proliferative diabetic retinopathy; CSME clinically significant macular edema; DME diabetic macular edema; dbh dot blot hemorrhages; CWS cotton wool spot; POAG primary open angle glaucoma; C/D cup-to-disc ratio; HVF humphrey visual field; GVF goldmann visual field; OCT optical coherence tomography; IOP intraocular pressure; BRVO Branch retinal vein occlusion; CRVO central retinal vein occlusion; CRAO central retinal artery occlusion; BRAO branch retinal artery occlusion; RT retinal tear; SB scleral buckle; PPV pars plana vitrectomy; VH Vitreous hemorrhage; PRP panretinal laser photocoagulation; IVK intravitreal kenalog; VMT vitreomacular traction; MH Macular hole;  NVD neovascularization of the disc; NVE neovascularization elsewhere; AREDS age  related eye disease study; ARMD age related macular degeneration; POAG primary open angle glaucoma; EBMD epithelial/anterior basement membrane dystrophy; ACIOL anterior chamber intraocular lens; IOL intraocular lens; PCIOL posterior chamber intraocular lens; Phaco/IOL phacoemulsification with intraocular lens placement; [place] photorefractive keratectomy; LASIK laser assisted in situ keratomileusis; HTN hypertension; DM diabetes mellitus; COPD chronic obstructive pulmonary disease

## 2019-04-11 ENCOUNTER — Ambulatory Visit (INDEPENDENT_AMBULATORY_CARE_PROVIDER_SITE_OTHER): Payer: Medicare Other | Admitting: Ophthalmology

## 2019-04-11 ENCOUNTER — Other Ambulatory Visit: Payer: Self-pay

## 2019-04-11 ENCOUNTER — Encounter (INDEPENDENT_AMBULATORY_CARE_PROVIDER_SITE_OTHER): Payer: Self-pay | Admitting: Ophthalmology

## 2019-04-11 DIAGNOSIS — H35371 Puckering of macula, right eye: Secondary | ICD-10-CM

## 2019-04-11 DIAGNOSIS — Z961 Presence of intraocular lens: Secondary | ICD-10-CM

## 2019-04-11 DIAGNOSIS — H59022 Cataract (lens) fragments in eye following cataract surgery, left eye: Secondary | ICD-10-CM | POA: Diagnosis not present

## 2019-04-11 DIAGNOSIS — H25811 Combined forms of age-related cataract, right eye: Secondary | ICD-10-CM

## 2019-04-11 DIAGNOSIS — H35033 Hypertensive retinopathy, bilateral: Secondary | ICD-10-CM | POA: Diagnosis not present

## 2019-04-11 DIAGNOSIS — H3581 Retinal edema: Secondary | ICD-10-CM

## 2019-04-11 DIAGNOSIS — H40052 Ocular hypertension, left eye: Secondary | ICD-10-CM

## 2019-04-11 DIAGNOSIS — I10 Essential (primary) hypertension: Secondary | ICD-10-CM

## 2019-05-02 NOTE — Progress Notes (Signed)
[name] Note  05/04/2019     CHIEF COMPLAINT Patient presents for Retina Follow Up   HISTORY OF PRESENT ILLNESS: Ryan [name] is a 73 y.o. male who presents to the clinic today for:   HPI    Retina Follow Up    In left eye.  This started 3 months ago.  Since onset it is stable.  I, the attending physician,  performed the HPI with the patient and updated documentation appropriately.          Comments    F/U lens material os. Patient states he has noticed occasional "gray cloud top of vision " os,denies flashes, floaters and ocular pain. Patient is using brimonidine bid, Ilevro PRN.        Last edited by Ryan Caffey, MD on 05/04/2019 10:00 AM. (History)    pt states he feel like his vision is clearing up, he states he has an occasional grey cloud OS, but only in dim light, pt did not use pressure drops this morning, pt notices a slow pupil response OS   Referring physician: Rutherford Guys, MD [place],   [id number]  HISTORICAL INFORMATION:   Selected notes from the MEDICAL RECORD NUMBER Referred by Dr. Rutherford [name] for concern of possible endophthalmitis OS LEE: 05.29.20 Ryan [name]) Ocular Hx-s/p cat sx 05.27.20 PMH-   CURRENT MEDICATIONS: Current Outpatient Medications (Ophthalmic Drugs)  Medication Sig  . brimonidine (ALPHAGAN) 0.2 % ophthalmic solution Place 1 drop into the left eye 3 (three) times daily.  . Difluprednate (DUREZOL) 0.05 % EMUL Place 1 drop into the left eye 4 (four) times daily.  . dorzolamide-timolol (COSOPT) 22.3-6.8 MG/ML ophthalmic solution Place 1 drop into the left eye 3 (three) times daily.  Marland Kitchen tropicamide (MYDRIACYL) 1 % ophthalmic solution Place 1 drop into the left eye 2 (two) times daily.   No current facility-administered medications for this visit.  (Ophthalmic Drugs)   Current Outpatient Medications (Other)  Medication Sig  . acyclovir (ZOVIRAX) 400 MG tablet Take 1 tablet  (400 mg total) by mouth daily. (Patient taking differently: Take 400 mg by mouth daily as needed (cold sores). )  . ARMOUR THYROID 60 MG tablet TAKE 1 TABLET DAILY (Patient taking differently: Take 60 mg by mouth daily before breakfast. )  . Ascorbic Acid (VITAMIN C) 1000 MG tablet Take 1,000 mg by mouth daily.    Marland Kitchen atorvastatin (LIPITOR) 10 MG tablet Take 10 mg by mouth at bedtime.   . cetirizine (ZYRTEC) 10 MG tablet Take 10 mg by mouth at bedtime as needed for allergies.   . Cholecalciferol (VITAMIN D) 1000 UNITS capsule Take 1,000 Units by mouth daily.    . Coenzyme Q10 100 MG capsule Take 100 mg by mouth daily.   . Cyanocobalamin (VITAMIN B-12 CR) 1500 MCG TBCR Take 1,500 mcg by mouth daily.   . fluticasone (FLONASE) 50 MCG/ACT nasal spray Place 1 spray into both nostrils daily as needed for allergies or rhinitis.  Marland Kitchen loratadine (CLARITIN) 10 MG tablet Take 10 mg by mouth daily as needed for allergies.   Marland Kitchen MAGNESIUM CITRATE PO Take 400 mg by mouth daily.   . tadalafil (CIALIS) 5 MG tablet Take 5 mg by mouth daily as needed for erectile dysfunction.   . vitamin E 400 UNIT capsule Take 400 Units by mouth daily.    . Zinc 50 MG CAPS Take 50 mg by mouth daily.    No current facility-administered medications  for this visit.  (Other)      REVIEW OF SYSTEMS: ROS    Positive for: Eyes   Negative for: Constitutional, Gastrointestinal, Neurological, Skin, Genitourinary, Musculoskeletal, HENT, Endocrine, Cardiovascular, Respiratory, Psychiatric, Allergic/Imm, Heme/Lymph   Last edited by Ryan Jordan, LPN on [id number]  075-GRM AM. (History)       ALLERGIES Allergies  Allergen Reactions  . Dust Mite Extract     UNSPECIFIED REACTION   . Grass Extracts [Gramineae Pollens]     UNSPECIFIED REACTION     PAST MEDICAL HISTORY Past Medical History:  Diagnosis Date  . Back pain   . Cancer [facility]) 2014   prostate cancer  . Cataracts, bilateral   . Dysrhythmia    occasional PAC-  asymptomatic, per MD pt cut back on caffeine  . Fungal nail infection   . Hearing loss    wear hearing aids  . HSV infection   . Hyperlipidemia   . Hypertension    no meds, dx yrs ago per patient but normal last several yrs  . Hypertensive retinopathy    OU  . Hypothyroidism   . Pneumonia    x 1   Past Surgical History:  Procedure Laterality Date  . ankle surgery trauma Right   . CATARACT EXTRACTION Left 11/29/2018   Dr. Gershon [name]  . COLONOSCOPY    . COSMETIC SURGERY     loose skin removed around neck area  . eye lid surgery    . EYE SURGERY    . fungal nail    . LASIK Bilateral   . PARS PLANA VITRECTOMY Left 03/01/2019   Procedure: PARS PLANA VITRECTOMY WITH 25 GAUGE;  Surgeon: Ryan Caffey, MD;  Location: [place];  Service: Ophthalmology;  Laterality: Left;  . REMOVAL RETAINED LENS Left 03/01/2019   Procedure: REMOVAL OF RETAINED LENS MATERIALS LEFT EYE;  Surgeon: Ryan Caffey, MD;  Location: [place];  Service: Ophthalmology;  Laterality: Left;  . WISDOM TOOTH EXTRACTION      FAMILY HISTORY Family History  Problem Relation Age of Onset  . Heart disease Mother   . Liver disease Father   . Alcohol abuse Father   . Cancer Father        hepatic cancer ?primary  . Arthritis Sister   . COPD Sister     SOCIAL HISTORY Social History   Tobacco Use  . Smoking status: Former Smoker    Types: Cigarettes    Quit date: 07/05/1981    Years since quitting: 37.8  . Smokeless tobacco: Never Used  Substance Use Topics  . Alcohol use: Yes    Alcohol/week: 3.0 - 4.0 standard drinks    Types: 3 - 4 Glasses of wine per week  . Drug use: No         OPHTHALMIC EXAM:  Base Eye Exam    Visual Acuity (Snellen - Linear)      Right Left   Dist [place] 20/20 -1 20/40 -1   Dist ph [place] 20/20 20/20       Tonometry (Tonopen, 9:45 AM)      Right Left   Pressure 10 19       Pupils      Dark Light Shape React APD   Right 3 2 Round Brisk None   Left 3 2 Round Brisk None       Visual  Fields (Counting fingers)      Left Right    Full Full       Extraocular Movement  Right Left    Full, Ortho Full, Ortho       Neuro/Psych    Oriented x3: Yes   Mood/Affect: Normal       Dilation    Both eyes: 1.0% Mydriacyl, 2.5% Phenylephrine @ 9:38 AM        Slit Lamp and Fundus Exam    External Exam      Right Left   External  Periorbital Ecchymosis       Slit Lamp Exam      Right Left   Lids/Lashes Dermatochalasis - upper lid, Meibomian gland dysfunction Dermatochalasis - upper lid   Conjunctiva/Sclera Trace nasal Pinguecula mild nasal pterygeium, 1+ Injection   Cornea Arcus, 1+ Punctate epithelial erosions, abrely visible lasik flap 1+ Punctate epithelial erosions, nasal pterygeium with +NV, lasik flap in good position with epithelial in growth at 0430, dry tear film   Anterior Chamber Deep and quiet Deep and quiet   Iris Round and dilated Round and dilated, mild TIDs at 0730-0800, ?pseudoexfoliative material pupil margin   Lens 2+ Nuclear sclerosis, 1-2+ Cortical cataract 3 piece sulcus IOL in good position and stable   Vitreous Vitreous syneresis post vitrectomy, clear, lens remnants gone       Fundus Exam      Right Left   Disc Pink and Sharp Pink and Sharp   C/D Ratio 0.2 0.2   Macula Flat, Blunted foveal reflex, Retinal pigment epithelial mottling, No heme or edema Flat, good foveal reflex, mild RPE changes, No heme or edema   Vessels Mild Vascular attenuation Mild Vascular attenuation   Periphery Attached    Attached, pigmented CR atrophy at 1030 / 0130, good laser changes          IMAGING AND PROCEDURES  Imaging and Procedures for @TODAY @  OCT, Retina - OU - Both Eyes       Right Eye Quality was good. Central Foveal Thickness: 312. Progression has been stable. Findings include abnormal foveal contour, no IRF, no SRF, epiretinal membrane, macular pucker.   Left Eye Quality was good. Central Foveal Thickness: 311. Progression has been stable.  Findings include normal foveal contour, no IRF, no SRF (Interval improvement in vitreous opacities).   Notes *Images captured and stored on drive  Diagnosis / Impression:  OD: mild ERM with macular pucker -- stable from prior OS: NFP, no IRF, no SRF   Clinical management:  See below  Abbreviations: NFP - Normal foveal profile. CME - cystoid macular edema. PED - pigment epithelial detachment. IRF - intraretinal fluid. SRF - subretinal fluid. EZ - ellipsoid zone. ERM - epiretinal membrane. ORA - outer retinal atrophy. ORT - outer retinal tubulation. SRHM - subretinal hyper-reflective material                 ASSESSMENT/PLAN:    ICD-10-CM   1. Retained lens material following cataract surgery of left eye  H59.022   2. Ocular hypertension, left  H40.052   3. Epiretinal membrane (ERM) of right eye  H35.371   4. Retinal edema  H35.81 OCT, Retina - OU - Both Eyes  5. Essential hypertension  I10   6. Hypertensive retinopathy of both eyes  H35.033   7. Combined forms of age-related cataract of right eye  H25.811   8. Pseudophakia  Z96.1   9. Ocular hypertension of left eye  H40.052     1,2. Retained Lens Material with ocular hypertension OS  - complicated CE + sulcus IOL OS (5.27.20, Shapiro) -- posterior capsule  rupture and anterior vitrectomy  - sulcus IOL in excellent position -- stable without pseudophakodonesis; +capsular phimosis/fibrosis  - of note, per Dr. Towanda Malkin report, pt had a history of pseudoexfoliation syndrome OS  - pre-op: persistent small cortical fragments in vitreous; small piece in inferior angle resolved  - tMax 42 OS on POD2 in Dr. Kellie Moor office -- improved to 22 on [place]big[place]  - s/p PPV/EL/FAX OS, 08.27.20             - doing well -- BCVA 20/20  - retained lens material gone             - IOP okay at19             - completed PF taper   - f/u 6 weeks-- POV, DFE, OCT  3,4. Epiretinal membrane, right eye  - mild ERM  - asymptomatic, no  metamorphopsia  - no indication for surgery at this time  - monitor  5,6. Hypertensive retinopathy OU  - discussed importance of tight BP control  - monitor  7. Mixed form age related cataract OD  - under the expert management of Dr. Gershon [name]  - surgery OD has been postponed pending post op course OS  8. Pseudophakia OS  - s/p CE + sulcus IOL (5.27.20, Dr. Gershon [name])  - IOL in good position  - retained lens material as above  9. Ocular Hypertension OS  - IOP 35 OS noted at Dr. Towanda Malkin office on Monday (10.05.20)   - IOP improved to 19 today -- ?Durezol steroid response?  - brimonidine BID OS -- decrease to QD   - Ilevro PRN OS  - of note, per Dr. Towanda Malkin report, pt had a history of pseudoexfoliation syndrome OS  - will f/u as scheduled and check gonio for pseuoexfoliation material in TM   Ophthalmic Meds Ordered this visit:  No orders of the defined types were placed in this encounter.      Return in about 6 weeks (around 06/15/2019) for f/u retained lens material OS, DFE, OCT.  There are no Patient Instructions on file for this visit.   Explained the diagnoses, plan, and follow up with the patient and they expressed understanding.  Patient expressed understanding of the importance of proper follow up care.   This document serves as a record of services personally performed by Gardiner Sleeper, MD, PhD. It was created on their behalf by Ernest Mallick, OA, an ophthalmic assistant. The creation of this record is the provider's dictation and/or activities during the visit.    Electronically signed by: Ernest Mallick, OA 10.28.2020 10:42 AM   Gardiner Sleeper, M.D., Ph.D. Diseases & Surgery of the Retina and Vitreous [facility]  I have reviewed the above documentation for accuracy and completeness, and I agree with the above. Gardiner Sleeper, M.D., Ph.D. 05/04/19 10:42 AM    Abbreviations: M myopia (nearsighted); A astigmatism; H hyperopia  (farsighted); P presbyopia; Mrx spectacle prescription;  CTL contact lenses; OD right eye; OS left eye; OU both eyes  XT exotropia; ET esotropia; PEK punctate epithelial keratitis; PEE punctate epithelial erosions; DES dry eye syndrome; MGD meibomian gland dysfunction; ATs artificial tears; PFAT's preservative free artificial tears; [place] nuclear sclerotic cataract; PSC posterior subcapsular cataract; ERM epi-retinal membrane; PVD posterior vitreous detachment; RD retinal detachment; DM diabetes mellitus; DR diabetic retinopathy; NPDR non-proliferative diabetic retinopathy; PDR proliferative diabetic retinopathy; CSME clinically significant macular edema; DME diabetic macular edema; dbh dot blot hemorrhages; CWS cotton wool  spot; POAG primary open angle glaucoma; C/D cup-to-disc ratio; HVF humphrey visual field; GVF goldmann visual field; OCT optical coherence tomography; IOP intraocular pressure; BRVO Branch retinal vein occlusion; CRVO central retinal vein occlusion; CRAO central retinal artery occlusion; BRAO branch retinal artery occlusion; RT retinal tear; SB scleral buckle; PPV pars plana vitrectomy; VH Vitreous hemorrhage; PRP panretinal laser photocoagulation; IVK intravitreal kenalog; VMT vitreomacular traction; MH Macular hole;  NVD neovascularization of the disc; NVE neovascularization elsewhere; AREDS age related eye disease study; ARMD age related macular degeneration; POAG primary open angle glaucoma; EBMD epithelial/anterior basement membrane dystrophy; ACIOL anterior chamber intraocular lens; IOL intraocular lens; PCIOL posterior chamber intraocular lens; Phaco/IOL phacoemulsification with intraocular lens placement; [place] photorefractive keratectomy; LASIK laser assisted in situ keratomileusis; HTN hypertension; DM diabetes mellitus; COPD chronic obstructive pulmonary disease

## 2019-05-04 ENCOUNTER — Encounter (INDEPENDENT_AMBULATORY_CARE_PROVIDER_SITE_OTHER): Payer: Self-pay | Admitting: Ophthalmology

## 2019-05-04 ENCOUNTER — Ambulatory Visit (INDEPENDENT_AMBULATORY_CARE_PROVIDER_SITE_OTHER): Payer: Medicare Other | Admitting: Ophthalmology

## 2019-05-04 DIAGNOSIS — I10 Essential (primary) hypertension: Secondary | ICD-10-CM | POA: Diagnosis not present

## 2019-05-04 DIAGNOSIS — H25811 Combined forms of age-related cataract, right eye: Secondary | ICD-10-CM | POA: Diagnosis not present

## 2019-05-04 DIAGNOSIS — H35033 Hypertensive retinopathy, bilateral: Secondary | ICD-10-CM

## 2019-05-04 DIAGNOSIS — H59022 Cataract (lens) fragments in eye following cataract surgery, left eye: Secondary | ICD-10-CM

## 2019-05-04 DIAGNOSIS — H3581 Retinal edema: Secondary | ICD-10-CM

## 2019-05-04 DIAGNOSIS — Z961 Presence of intraocular lens: Secondary | ICD-10-CM | POA: Diagnosis not present

## 2019-05-04 DIAGNOSIS — H40052 Ocular hypertension, left eye: Secondary | ICD-10-CM | POA: Diagnosis not present

## 2019-05-04 DIAGNOSIS — H35371 Puckering of macula, right eye: Secondary | ICD-10-CM | POA: Diagnosis not present

## 2019-05-18 ENCOUNTER — Other Ambulatory Visit: Payer: Self-pay | Admitting: Family Medicine

## 2019-06-15 ENCOUNTER — Encounter (INDEPENDENT_AMBULATORY_CARE_PROVIDER_SITE_OTHER): Payer: Medicare Other | Admitting: Ophthalmology

## 2019-06-17 NOTE — Progress Notes (Signed)
[facility] Note  06/22/2019     CHIEF COMPLAINT Patient presents for Retina Follow Up   HISTORY OF PRESENT ILLNESS: Ryan [name] is a 73 y.o. male who presents to the clinic today for:   HPI    Retina Follow Up    Patient presents with  Other (Retained lens material OS).  In left eye.  Severity is moderate.  Duration of 7 weeks.  Since onset it is stable.  I, the attending physician,  performed the HPI with the patient and updated documentation appropriately.          Comments    Patient states still sees double images with OS only. Overall vision has improved OS since removal of retained lens material. Using brimondine at bedtime OS only.        Last edited by [name], MD on 06/24/2019  2:47 PM. (History)    pts wife is not here today due to rotator cuff sx last week, pt has not had an exam to glasses due to not wanting to wear them again, pt states he has seen Dr. 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 Rutherford Guys for concern of possible endophthalmitis OS LEE: 05.29.20 [name]) Ocular Hx-s/p cat sx 05.27.20 PMH-   CURRENT MEDICATIONS: Current Outpatient Medications (Ophthalmic Drugs)  Medication Sig  . brimonidine (ALPHAGAN) 0.2 % ophthalmic solution Place 1 drop into the left eye 3 (three) times daily. (Patient taking differently: Place 1 drop into the left eye at bedtime. )  . tropicamide (MYDRIACYL) 1 % ophthalmic solution Place 1 drop into the left eye 2 (two) times daily.  . Difluprednate (DUREZOL) 0.05 % EMUL Place 1 drop into the left eye 4 (four) times daily. (Patient not taking: Reported on 06/22/2019)  . dorzolamide-timolol (COSOPT) 22.3-6.8 MG/ML  ophthalmic solution Place 1 drop into the left eye 3 (three) times daily. (Patient not taking: Reported on 06/22/2019)   No current facility-administered medications for this visit. (Ophthalmic Drugs)   Current Outpatient Medications (Other)  Medication Sig  . acyclovir (ZOVIRAX) 400 MG tablet Take 1 tablet (400 mg total) by mouth daily as needed (cold sores).  [name] THYROID 60 MG tablet TAKE 1 TABLET DAILY (Patient taking differently: Take 60 mg by mouth daily before breakfast. )  . Ascorbic Acid (VITAMIN C) 1000 MG tablet Take 1,000 mg by mouth daily.    Marland Kitchen atorvastatin (LIPITOR) 10 MG tablet Take 10 mg by mouth at bedtime.   . cetirizine (ZYRTEC) 10 MG tablet Take 10 mg by mouth at bedtime as needed for allergies.   . Cholecalciferol (VITAMIN D) 1000 UNITS capsule Take 1,000 Units by mouth daily.    . Coenzyme Q10 100 MG capsule Take 100 mg by mouth daily.   . Cyanocobalamin (VITAMIN B-12 CR) 1500 MCG TBCR Take 1,500 mcg by mouth daily.   . fluticasone (FLONASE) 50 MCG/ACT nasal spray Place 1 spray into both nostrils daily as needed for allergies or rhinitis.  Marland Kitchen loratadine (CLARITIN) 10 MG tablet Take 10 mg by mouth daily as needed for allergies.   Marland Kitchen MAGNESIUM CITRATE PO Take 400 mg by mouth daily.   . tadalafil (CIALIS) 5 MG tablet  Take 5 mg by mouth daily as needed for erectile dysfunction.   . vitamin E 400 UNIT capsule Take 400 Units by mouth daily.    . Zinc 50 MG CAPS Take 50 mg by mouth daily.    No current facility-administered medications for this visit. (Other)      REVIEW OF SYSTEMS: ROS    Positive for: Eyes   Negative for: Constitutional, Gastrointestinal, Neurological, Skin, Genitourinary, Musculoskeletal, HENT, Endocrine, Cardiovascular, Respiratory, Psychiatric, Allergic/Imm, Heme/Lymph   Last edited by Roselee Nova D, COT on 06/22/2019 10:28 AM. (History)       ALLERGIES Allergies  Allergen Reactions  . Dust Mite Extract     UNSPECIFIED REACTION   .  Grass Extracts [Gramineae Pollens]     UNSPECIFIED REACTION     PAST MEDICAL HISTORY Past Medical History:  Diagnosis Date  . Back pain   . Cancer [facility]) 2014   prostate cancer  . Cataracts, bilateral   . Dysrhythmia    occasional PAC- asymptomatic, per MD pt cut back on caffeine  . Fungal nail infection   . Hearing loss    wear hearing aids  . HSV infection   . Hyperlipidemia   . Hypertension    no meds, dx yrs ago per patient but normal last several yrs  . Hypertensive retinopathy    OU  . Hypothyroidism   . Pneumonia    x 1   Past Surgical History:  Procedure Laterality Date  . ankle surgery trauma Right   . CATARACT EXTRACTION Left 11/29/2018   Dr. [name]  . COLONOSCOPY    . COSMETIC SURGERY     loose skin removed around neck area  . eye lid surgery    . EYE SURGERY    . fungal nail    . LASIK Bilateral   . PARS PLANA VITRECTOMY Left 03/01/2019   Procedure: PARS PLANA VITRECTOMY WITH 25 GAUGE;  Surgeon: [name], MD;  Location: [facility];  Service: Ophthalmology;  Laterality: Left;  . REMOVAL RETAINED LENS Left 03/01/2019   Procedure: REMOVAL OF RETAINED LENS MATERIALS LEFT EYE;  Surgeon: [name], MD;  Location: [facility];  Service: Ophthalmology;  Laterality: Left;  . WISDOM TOOTH EXTRACTION      FAMILY HISTORY Family History  Problem Relation Age of Onset  . Heart disease Mother   . Liver disease Father   . Alcohol abuse Father   . Cancer Father        hepatic cancer ?primary  . Arthritis Sister   . COPD Sister     SOCIAL HISTORY Social History   Tobacco Use  . Smoking status: Former Smoker    Types: Cigarettes    Quit date: 07/05/1981    Years since quitting: 37.9  . Smokeless tobacco: Never Used  Substance Use Topics  . Alcohol use: Yes    Alcohol/week: 3.0 - 4.0 standard drinks    Types: 3 - 4 Glasses of wine per week  . Drug use: No         OPHTHALMIC EXAM:  Base Eye Exam    Visual Acuity (Snellen - Linear)      Right Left    Dist [place] 20/20 -1 20/40 -2   Dist ph [place]  20/20       Tonometry (Tonopen, 10:26 AM)      Right Left   Pressure 18 20       Gonioscopy (Sussman four mirror)      Right Left   Temporal  Grade 4 Grade 4   Nasal Grade 4 Grade 4   Superior Grade 4 Grade 4   Inferior Grade 4 Grade 4  3-4+pigment TM 360       Pupils      Dark Light Shape React APD   Right 3 2 Round Brisk None   Left 6 6 Round Minimal None       Visual Fields (Counting fingers)      Left Right    Full Full       Extraocular Movement      Right Left    Full, Ortho Full, Ortho       Neuro/Psych    Oriented x3: Yes   Mood/Affect: Normal       Dilation    Both eyes: 1.0% Mydriacyl, 2.5% Phenylephrine @ 10:26 AM        Slit Lamp and Fundus Exam    External Exam      Right Left   External  Periorbital Ecchymosis       Slit Lamp Exam      Right Left   Lids/Lashes Dermatochalasis - upper lid, Meibomian gland dysfunction Dermatochalasis - upper lid   Conjunctiva/Sclera Trace nasal Pinguecula mild nasal pterygeium, 1+ Injection   Cornea Arcus, 1+ Punctate epithelial erosions, barely visible lasik flap 1+ Punctate epithelial erosions, nasal pterygeium with +NV, lasik flap in good position with epithelial in growth at 0430, dry tear film   Anterior Chamber Deep and quiet Deep and quiet   Iris Round and dilated Round and dilated, mild TIDs at 0730-0800, ?pseudoexfoliative material pupil margin   Lens 2+ Nuclear sclerosis, 2+ Cortical cataract 3 piece sulcus IOL in good position and stable   Vitreous Vitreous syneresis post vitrectomy, clear, lens remnants gone       Fundus Exam      Right Left   Disc Pink and Sharp Pink and Sharp   C/D Ratio 0.2 0.2   Macula Flat, Blunted foveal reflex, Retinal pigment epithelial mottling, No heme or edema Flat, good foveal reflex, mild RPE changes, No heme or edema   Vessels Mild Vascular attenuation Mild Vascular attenuation   Periphery Attached    Attached, pigmented CR  atrophy at 1030 / 0130, good laser changes        Refraction    Manifest Refraction      Sphere Cylinder Axis Dist VA   Right [place] +1.00 012 20/20   Left -1.00 +1.00 125 20/20-1  K's OS: 41.75@104 /41.50@014           IMAGING AND PROCEDURES  Imaging and Procedures for @TODAY @  OCT, Retina - OU - Both Eyes       Right Eye Quality was good. Central Foveal Thickness: 309. Progression has been stable. Findings include abnormal foveal contour, no IRF, no SRF, epiretinal membrane, macular pucker (Mild interval progression of pucker).   Left Eye Quality was good. Central Foveal Thickness: 308. Progression has been stable. Findings include normal foveal contour, no IRF, no SRF (Interval improvement in vitreous opacities).   Notes *Images captured and stored on drive  Diagnosis / Impression:  OD: mild ERM with macular pucker -- stable from prior OS: NFP, no IRF, no SRF   Clinical management:  See below  Abbreviations: NFP - Normal foveal profile. CME - cystoid macular edema. PED - pigment epithelial detachment. IRF - intraretinal fluid. SRF - subretinal fluid. EZ - ellipsoid zone. ERM - epiretinal membrane. ORA - outer retinal atrophy. ORT - outer retinal tubulation.  SRHM - subretinal hyper-reflective material                 ASSESSMENT/PLAN:    ICD-10-CM   1. Retained lens material following cataract surgery of left eye  H59.022   2. Ocular hypertension, left  H40.052   3. Epiretinal membrane (ERM) of right eye  H35.371   4. Retinal edema  H35.81 OCT, Retina - OU - Both Eyes  5. Essential hypertension  I10   6. Hypertensive retinopathy of both eyes  H35.033   7. Combined forms of age-related cataract of right eye  H25.811   8. Pseudophakia  Z96.1   9. Ocular hypertension of left eye  H40.052   10. Monocular diplopia of left eye  H53.2   11. S/P LASIK (laser assisted in situ keratomileusis) of both eyes  Z98.890     1,2. Retained Lens Material with ocular  hypertension OS  - complicated CE + sulcus IOL OS (5.27.20, Shapiro) -- posterior capsule rupture and anterior vitrectomy  - sulcus IOL in excellent position -- stable without pseudophakodonesis; +capsular phimosis/fibrosis  - of note, per Dr. Towanda Malkin report, pt had a history of pseudoexfoliation syndrome OS  - pre-op: persistent small cortical fragments in vitreous; small piece in inferior angle resolved  - tMax 42 OS on POD2 in Dr. Kellie Moor office -- improved to 22 on [place]  - s/p PPV/EL/FAX OS, 08.27.20             - doing well -- BCVA 20/20  - retained lens material gone             - IOP okay at 20  - f/u 6-9 months-- POV, DFE, OCT  3,4. Epiretinal membrane, right eye  - mild ERM  - asymptomatic, no metamorphopsia  - no indication for surgery at this time  - monitor  5,6. Hypertensive retinopathy OU  - discussed importance of tight BP control  - monitor  7. Mixed form age related cataract OD  - under the expert management of Dr. [name]  - surgery OD has been postponed pending post op course OS  8. Pseudophakia OS  - s/p CE + sulcus IOL (5.27.20, Dr. [name])  - IOL in good position  - retained lens material as above  9. Ocular Hypertension OS  - IOP 35 OS noted at Dr. Towanda Malkin office on Monday (10.05.20)   - IOP improved to 20 today -- ?Durezol steroid response?  - brimonidine QD   - Ilevro PRN OS  - of note, per Dr. Towanda Malkin report, pt had a history of pseudoexfoliation syndrome OS  - Gonio 12.18.20 shows 3+ pigment in TM  10,11. Monocular diplopia OS  - pt reports horizontal "shadowing" OS  - likely related to IOL + history of LASIK  **Recommend evaluation with Dr. Lucita Ferrara to discuss cataract OD, monocular diplopia OS, history of LASIK OU**   Ophthalmic Meds Ordered this visit:  No orders of the defined types were placed in this encounter.      Return for f/u 6-9 months retained lens material OS, DFE, OCT.  There are no Patient Instructions on file  for this visit.   Explained the diagnoses, plan, and follow up with the patient and they expressed understanding.  Patient expressed understanding of the importance of proper follow up care.   This document serves as a record of services personally performed by Gardiner Sleeper, MD, PhD. It was created on their behalf by Ernest Mallick, OA, an ophthalmic assistant. The creation  of this record is the provider's dictation and/or activities during the visit.    Electronically signed by: Ernest Mallick, OA 12.13.2020 2:54 PM   Gardiner Sleeper, M.D., Ph.D. Diseases & Surgery of the Retina and Vitreous [facility]  I have reviewed the above documentation for accuracy and completeness, and I agree with the above. Gardiner Sleeper, M.D., Ph.D. 06/24/19 2:54 PM    Abbreviations: M myopia (nearsighted); A astigmatism; H hyperopia (farsighted); P presbyopia; Mrx spectacle prescription;  CTL contact lenses; OD right eye; OS left eye; OU both eyes  XT exotropia; ET esotropia; PEK punctate epithelial keratitis; PEE punctate epithelial erosions; DES dry eye syndrome; MGD meibomian gland dysfunction; ATs artificial tears; PFAT's preservative free artificial tears; Scott nuclear sclerotic cataract; PSC posterior subcapsular cataract; ERM epi-retinal membrane; PVD posterior vitreous detachment; RD retinal detachment; DM diabetes mellitus; DR diabetic retinopathy; NPDR non-proliferative diabetic retinopathy; PDR proliferative diabetic retinopathy; CSME clinically significant macular edema; DME diabetic macular edema; dbh dot blot hemorrhages; CWS cotton wool spot; POAG primary open angle glaucoma; C/D cup-to-disc ratio; HVF humphrey visual field; GVF goldmann visual field; OCT optical coherence tomography; IOP intraocular pressure; BRVO Branch retinal vein occlusion; CRVO central retinal vein occlusion; CRAO central retinal artery occlusion; BRAO branch retinal artery occlusion; RT retinal tear; SB  scleral buckle; PPV pars plana vitrectomy; VH Vitreous hemorrhage; PRP panretinal laser photocoagulation; IVK intravitreal kenalog; VMT vitreomacular traction; MH Macular hole;  NVD neovascularization of the disc; NVE neovascularization elsewhere; AREDS age related eye disease study; ARMD age related macular degeneration; POAG primary open angle glaucoma; EBMD epithelial/anterior basement membrane dystrophy; ACIOL anterior chamber intraocular lens; IOL intraocular lens; PCIOL posterior chamber intraocular lens; Phaco/IOL phacoemulsification with intraocular lens placement; Archer photorefractive keratectomy; LASIK laser assisted in situ keratomileusis; HTN hypertension; DM diabetes mellitus; COPD chronic obstructive pulmonary disease

## 2019-06-22 ENCOUNTER — Other Ambulatory Visit: Payer: Self-pay

## 2019-06-22 ENCOUNTER — Encounter (INDEPENDENT_AMBULATORY_CARE_PROVIDER_SITE_OTHER): Payer: Self-pay | Admitting: Ophthalmology

## 2019-06-22 ENCOUNTER — Ambulatory Visit (INDEPENDENT_AMBULATORY_CARE_PROVIDER_SITE_OTHER): Payer: Medicare Other | Admitting: Ophthalmology

## 2019-06-22 DIAGNOSIS — Z961 Presence of intraocular lens: Secondary | ICD-10-CM

## 2019-06-22 DIAGNOSIS — H35033 Hypertensive retinopathy, bilateral: Secondary | ICD-10-CM

## 2019-06-22 DIAGNOSIS — Z9889 Other specified postprocedural states: Secondary | ICD-10-CM

## 2019-06-22 DIAGNOSIS — H59022 Cataract (lens) fragments in eye following cataract surgery, left eye: Secondary | ICD-10-CM | POA: Diagnosis not present

## 2019-06-22 DIAGNOSIS — I10 Essential (primary) hypertension: Secondary | ICD-10-CM

## 2019-06-22 DIAGNOSIS — H25811 Combined forms of age-related cataract, right eye: Secondary | ICD-10-CM

## 2019-06-22 DIAGNOSIS — H35371 Puckering of macula, right eye: Secondary | ICD-10-CM

## 2019-06-22 DIAGNOSIS — H40052 Ocular hypertension, left eye: Secondary | ICD-10-CM | POA: Diagnosis not present

## 2019-06-22 DIAGNOSIS — H3581 Retinal edema: Secondary | ICD-10-CM

## 2019-06-22 DIAGNOSIS — H532 Diplopia: Secondary | ICD-10-CM

## 2019-07-06 DIAGNOSIS — N5311 Retarded ejaculation: Secondary | ICD-10-CM

## 2019-07-06 HISTORY — DX: Retarded ejaculation: N53.11

## 2019-07-26 IMAGING — DX DG CHEST 2V
2 series · 2 of 2 positions shown · non-contrast
Comparison: None.

CLINICAL DATA: Cough and fever

EXAM:
CHEST - 2 VIEW

[chest pa]
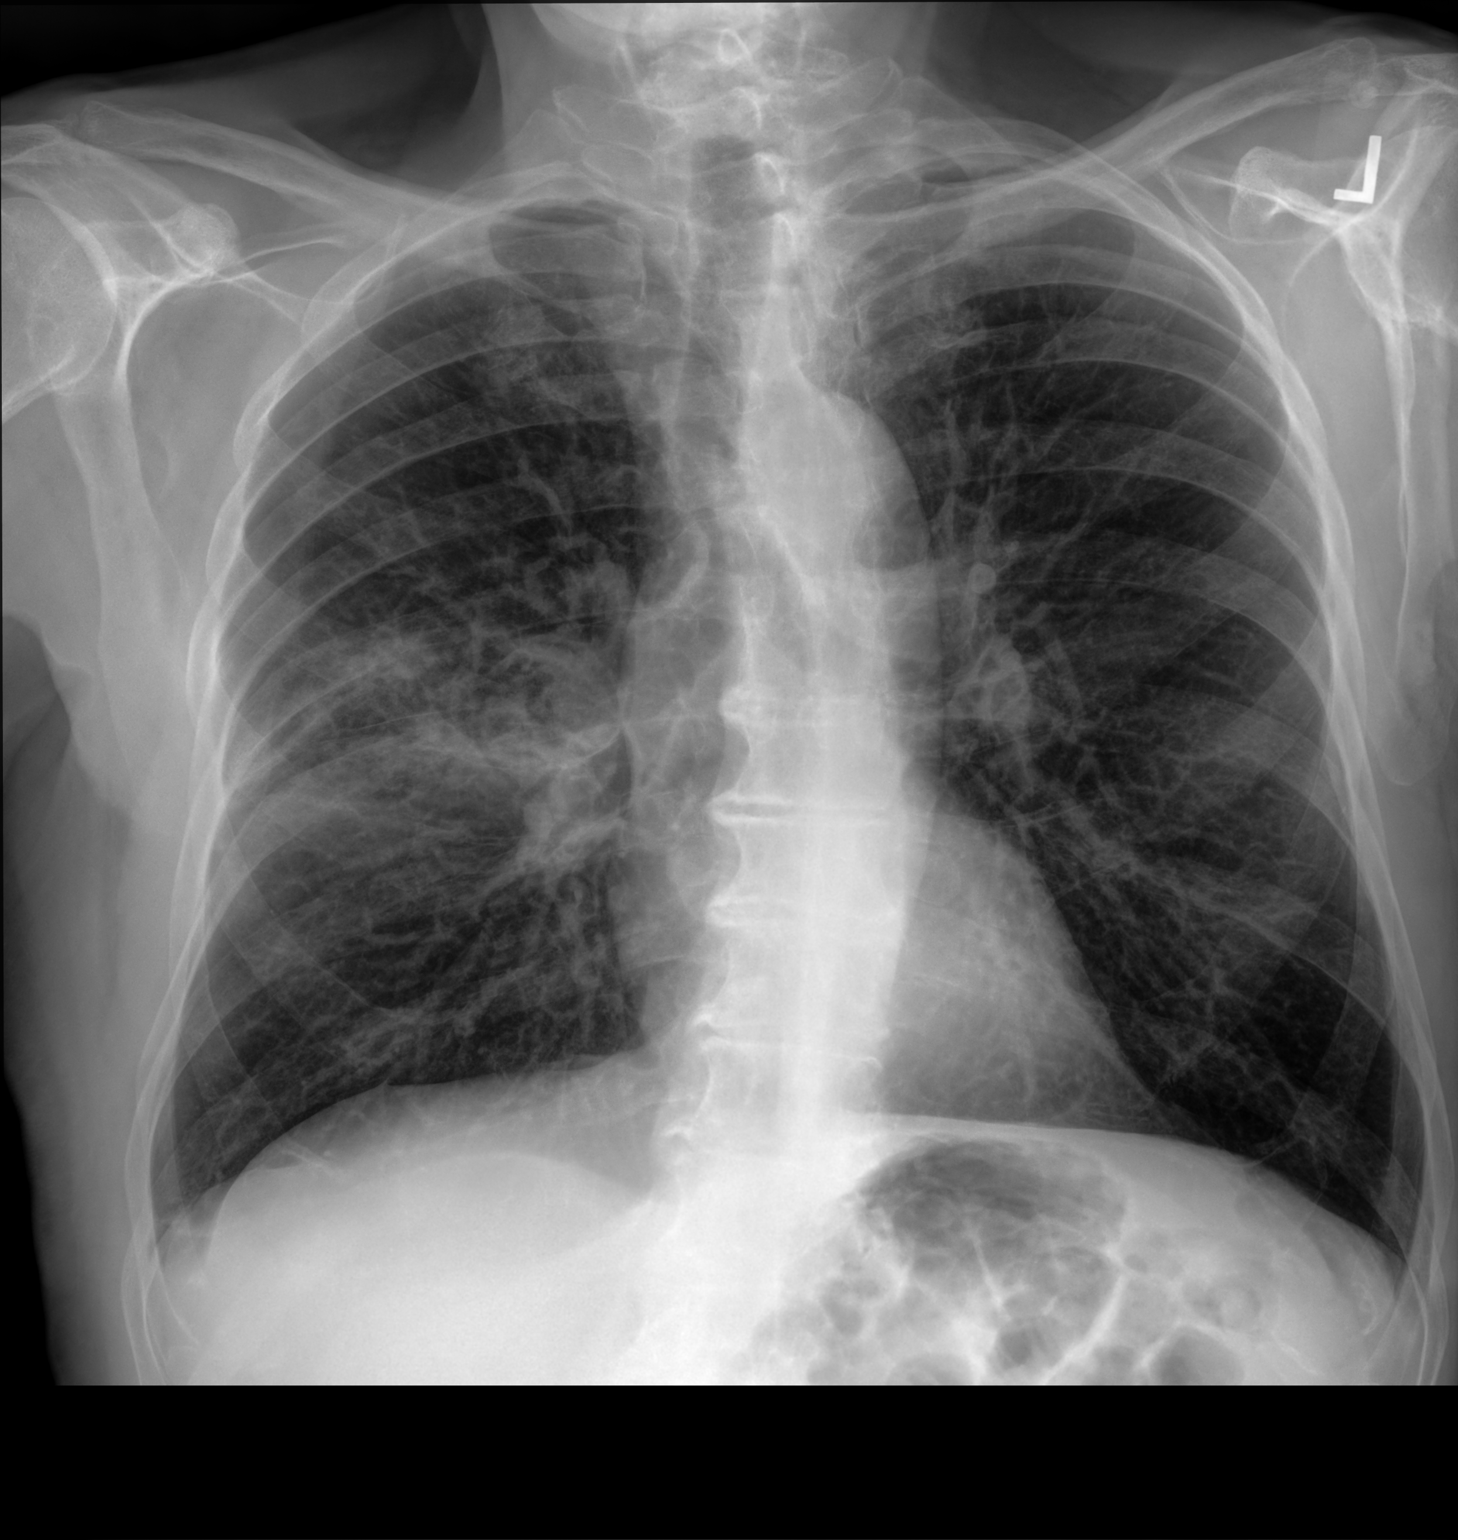

[chest lat]
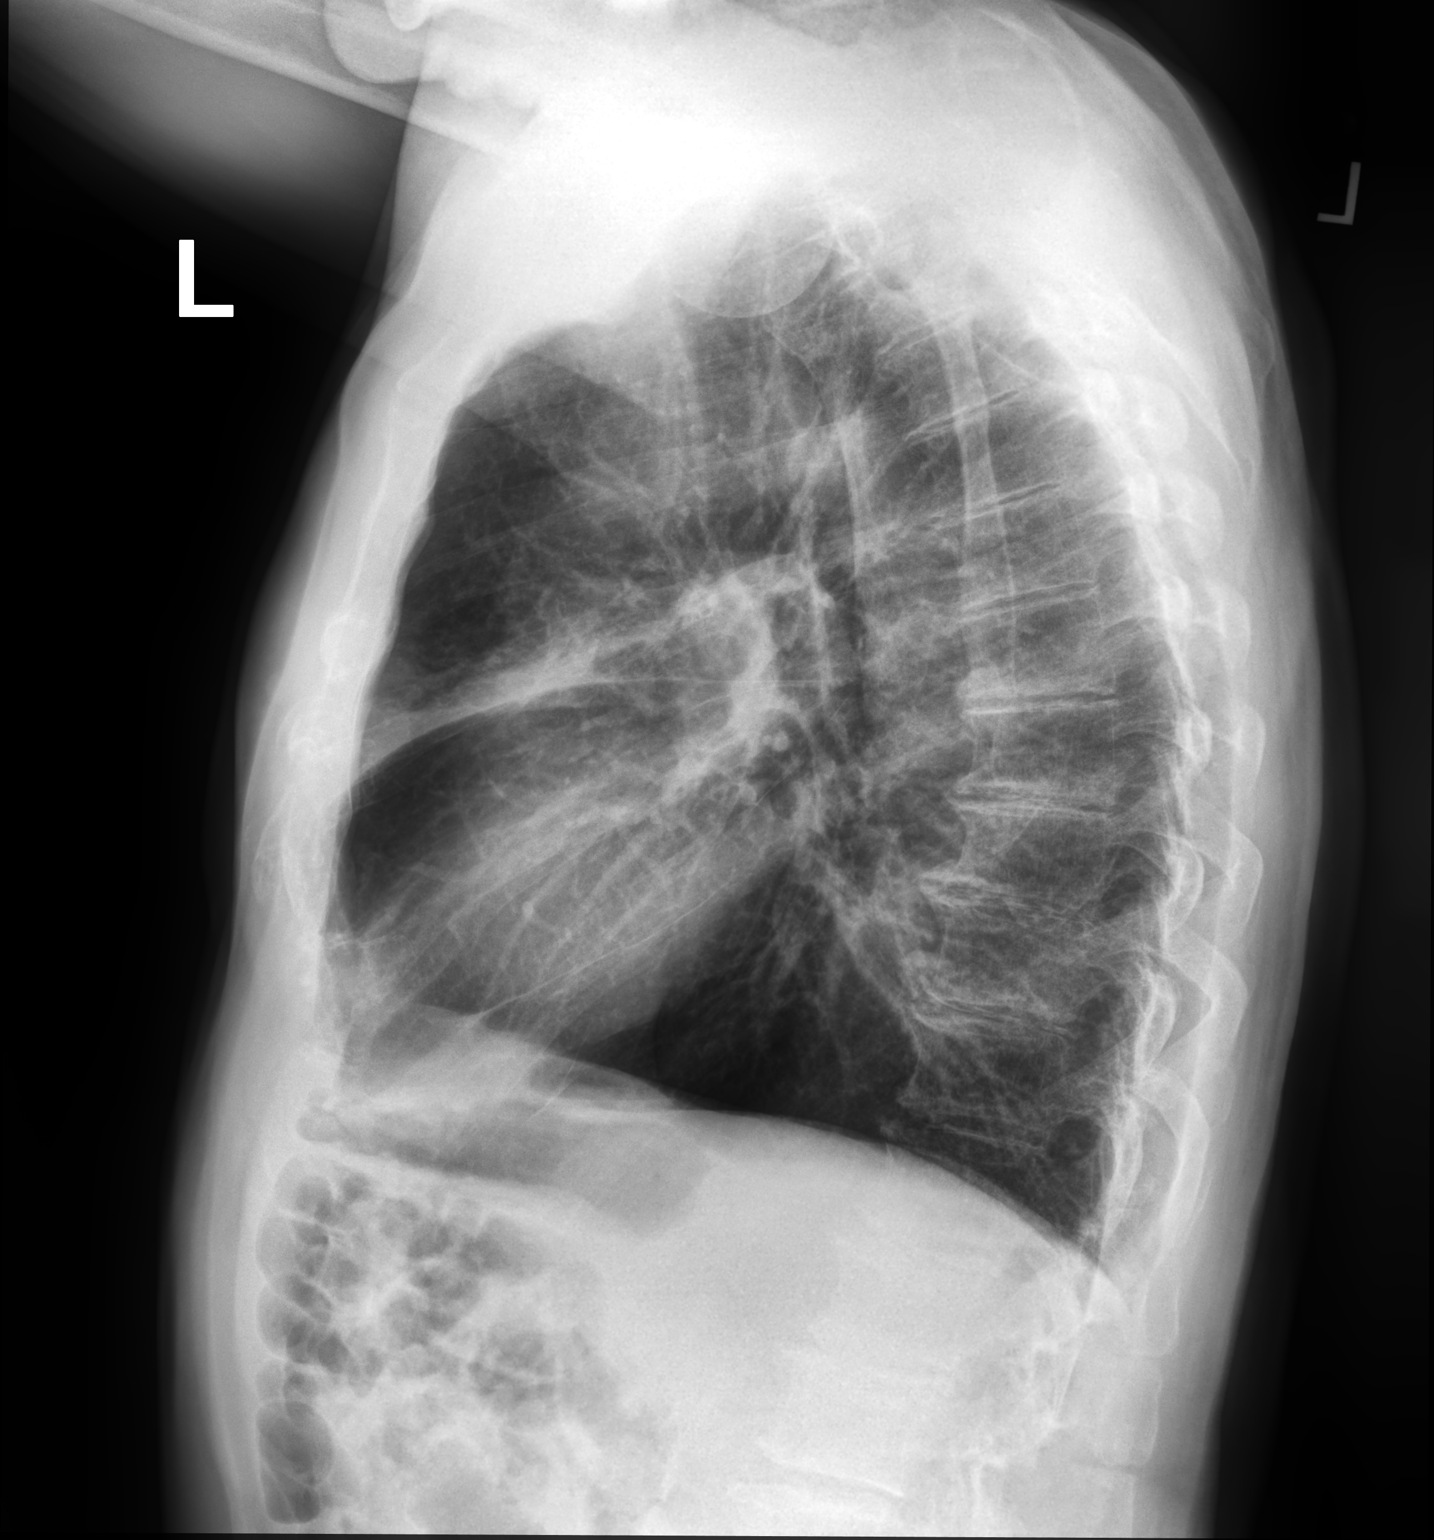

[2 of 2 positions shown; findings below may reference images not displayed]

FINDINGS: Right upper lobe infiltrate just above the minor fissure. Probable
pneumonia given the symptoms.

COPD with hyperinflation.  Negative for heart failure or effusion.
IMPRESSION: Right upper lobe infiltrate, probable pneumonia

Followup PA and lateral chest X-ray is recommended in 3-4 weeks
following trial of antibiotic therapy to ensure resolution and
exclude underlying malignancy.

## 2019-08-14 ENCOUNTER — Other Ambulatory Visit: Payer: Self-pay | Admitting: Family Medicine

## 2019-08-22 DIAGNOSIS — H35371 Puckering of macula, right eye: Secondary | ICD-10-CM | POA: Diagnosis not present

## 2019-08-22 DIAGNOSIS — Z961 Presence of intraocular lens: Secondary | ICD-10-CM | POA: Diagnosis not present

## 2019-08-22 DIAGNOSIS — H2511 Age-related nuclear cataract, right eye: Secondary | ICD-10-CM | POA: Diagnosis not present

## 2019-08-22 DIAGNOSIS — H25041 Posterior subcapsular polar age-related cataract, right eye: Secondary | ICD-10-CM | POA: Diagnosis not present

## 2019-08-28 ENCOUNTER — Encounter: Payer: Self-pay | Admitting: Family Medicine

## 2019-09-04 ENCOUNTER — Ambulatory Visit (INDEPENDENT_AMBULATORY_CARE_PROVIDER_SITE_OTHER): Payer: Medicare Other | Admitting: Family Medicine

## 2019-09-04 ENCOUNTER — Encounter: Payer: Self-pay | Admitting: Family Medicine

## 2019-09-04 ENCOUNTER — Other Ambulatory Visit: Payer: Self-pay | Admitting: *Deleted

## 2019-09-04 ENCOUNTER — Other Ambulatory Visit: Payer: Self-pay

## 2019-09-04 VITALS — BP 118/70 | HR 79 | Temp 97.7°F | Ht 67.5 in | Wt 164.1 lb

## 2019-09-04 DIAGNOSIS — E039 Hypothyroidism, unspecified: Secondary | ICD-10-CM

## 2019-09-04 DIAGNOSIS — Z1211 Encounter for screening for malignant neoplasm of colon: Secondary | ICD-10-CM | POA: Diagnosis not present

## 2019-09-04 DIAGNOSIS — I10 Essential (primary) hypertension: Secondary | ICD-10-CM

## 2019-09-04 DIAGNOSIS — E785 Hyperlipidemia, unspecified: Secondary | ICD-10-CM

## 2019-09-04 NOTE — Patient Instructions (Signed)

## 2019-09-04 NOTE — Progress Notes (Signed)
Subjective:     Patient ID: Ryan [name], male   DOB: 01-31-1946, 74 y.o.   MRN: [id number]  HPI Ryan [name] seen for medical follow-up.  He has chronic problems including hypertension, hypothyroidism, history of prostate cancer, hyperlipidemia.  He is followed by the [place] and brings in copies of labs that were done there back in February.  His thyroid was stable.  He had vitamin D level which was normal as well as B12.  CBC and chemistries were unremarkable.  Cholesterol is 206 with triglycerides 144, HDL 65, and LDL 112.  In reviewing immunizations he is up-to-date.  He has not had colonoscopy though in over 10 years.  He is willing to consider Cologuard.  No prior history of colon polyps.  He had cataract surgery during the past year but had some complications and had some residual debris and had to have follow-up surgery following his initial cataract for vitrectomy.  He states he had exercise tolerance test over at the [place] which came back normal.  Medications reviewed and compliant with all.  He has gotten Covid vaccination through the [place]  Past Medical History:  Diagnosis Date  . Back pain   . Cancer [facility]) 2014   prostate cancer  . Cataracts, bilateral   . Dysrhythmia    occasional PAC- asymptomatic, per MD pt cut back on caffeine  . Fungal nail infection   . Hearing loss    wear hearing aids  . HSV infection   . Hyperlipidemia   . Hypertension    no meds, dx yrs ago per patient but normal last several yrs  . Hypertensive retinopathy    OU  . Hypothyroidism   . Pneumonia    x 1   Past Surgical History:  Procedure Laterality Date  . ankle surgery trauma Right   . CATARACT EXTRACTION Left 11/29/2018   Dr. [name]  . COLONOSCOPY    . COSMETIC SURGERY     loose skin removed around neck area  . eye lid surgery    . EYE SURGERY    . fungal nail    . LASIK Bilateral   . PARS PLANA VITRECTOMY Left 03/01/2019   Procedure: PARS PLANA VITRECTOMY WITH 25 GAUGE;  Surgeon: [name], MD;  Location: [facility];  Service: Ophthalmology;  Laterality: Left;  . REMOVAL RETAINED LENS Left 03/01/2019   Procedure: REMOVAL OF RETAINED LENS MATERIALS LEFT EYE;  Surgeon: [name], MD;  Location: [facility];  Service: Ophthalmology;  Laterality: Left;  . WISDOM TOOTH EXTRACTION      reports that he quit smoking about 38 years ago. His smoking use included cigarettes. He has never used smokeless tobacco. He reports current alcohol use of about 3.0 - 4.0 standard drinks of alcohol per week. He reports that he does not use drugs. family history includes Alcohol abuse in his father; Arthritis in his sister; COPD in his sister; Cancer in his father; Heart disease in his mother; Liver disease in his father. Allergies  Allergen Reactions  . Dust Mite Extract     UNSPECIFIED REACTION   . Grass Extracts [Gramineae Pollens]     UNSPECIFIED REACTION    Wt Readings from Last 3 Encounters:  09/04/19 164 lb 1 oz (74.4 kg)  03/01/19 160 lb (72.6 kg)  09/05/18 155 lb 3.2 oz (70.4 kg)     Review of Systems  Constitutional: Negative for appetite change, fatigue and unexpected weight change.  Eyes: Negative for visual disturbance.  Respiratory: Negative for  cough, chest tightness and shortness of breath.   Cardiovascular: Negative for chest pain, palpitations and leg swelling.  Gastrointestinal: Negative for abdominal pain.  Endocrine: Negative for polydipsia and polyuria.  Genitourinary: Negative for dysuria.  Neurological: Negative for dizziness, syncope, weakness, light-headedness and headaches.       Objective:   Physical Exam Constitutional:      Appearance: He is well-developed.  HENT:     Right Ear: External ear normal.     Left Ear: External ear normal.  Eyes:     Pupils: Pupils are equal, round, and reactive to light.  Neck:     Thyroid: No thyromegaly.  Cardiovascular:     Rate and Rhythm: Normal rate and regular rhythm.  Pulmonary:     Effort: Pulmonary effort is  normal. No respiratory distress.     Breath sounds: Normal breath sounds. No wheezing or rales.  Musculoskeletal:     Cervical back: Neck supple.     Right lower leg: No edema.     Left lower leg: No edema.  Neurological:     Mental Status: He is alert and oriented to person, place, and time.        Assessment:     #1 hypertension stable and at goal  #2 in need of ongoing colon cancer screening.  Last colonoscopy was over 12 years ago.  Patient asymptomatic  #3 dyslipidemia treated with statin and stable by recent labs  #4 hypothyroidism on replacement and stable by recent labs      Plan:     -Continue current medications -We discussed colon cancer strategies with colonoscopy versus Cologuard.  He prefers Cologuard at this time.  He is aware of potential false negatives and false positives with Cologuard  [name] MD [facility]

## 2019-09-17 ENCOUNTER — Encounter: Payer: Self-pay | Admitting: Family Medicine

## 2019-09-17 DIAGNOSIS — Z1211 Encounter for screening for malignant neoplasm of colon: Secondary | ICD-10-CM

## 2019-09-18 DIAGNOSIS — H21262 Iris atrophy (essential) (progressive), left eye: Secondary | ICD-10-CM | POA: Diagnosis not present

## 2019-10-01 NOTE — Telephone Encounter (Signed)
Pt's spouse called cologuard and they stated the doctors office needs to call and follow up. They were unable to get any information there than that.    Pt can be reached at [phone]

## 2019-10-17 DIAGNOSIS — Z1211 Encounter for screening for malignant neoplasm of colon: Secondary | ICD-10-CM | POA: Diagnosis not present

## 2019-10-17 LAB — PSA: PSA: 2.82

## 2019-10-17 LAB — COLOGUARD: Cologuard: NEGATIVE

## 2019-10-24 DIAGNOSIS — N5311 Retarded ejaculation: Secondary | ICD-10-CM | POA: Diagnosis not present

## 2019-10-24 DIAGNOSIS — N5201 Erectile dysfunction due to arterial insufficiency: Secondary | ICD-10-CM | POA: Diagnosis not present

## 2019-10-24 DIAGNOSIS — C61 Malignant neoplasm of prostate: Secondary | ICD-10-CM | POA: Diagnosis not present

## 2019-10-29 ENCOUNTER — Encounter: Payer: Self-pay | Admitting: Family Medicine

## 2019-10-31 ENCOUNTER — Encounter: Payer: Self-pay | Admitting: Family Medicine

## 2019-11-06 ENCOUNTER — Encounter: Payer: Self-pay | Admitting: Family Medicine

## 2019-11-06 ENCOUNTER — Other Ambulatory Visit: Payer: Self-pay | Admitting: Family Medicine

## 2019-11-06 DIAGNOSIS — H401430 Capsular glaucoma with pseudoexfoliation of lens, bilateral, stage unspecified: Secondary | ICD-10-CM | POA: Diagnosis not present

## 2019-11-06 DIAGNOSIS — H5319 Other subjective visual disturbances: Secondary | ICD-10-CM | POA: Diagnosis not present

## 2019-11-06 DIAGNOSIS — H04123 Dry eye syndrome of bilateral lacrimal glands: Secondary | ICD-10-CM | POA: Diagnosis not present

## 2019-11-06 DIAGNOSIS — H40023 Open angle with borderline findings, high risk, bilateral: Secondary | ICD-10-CM | POA: Diagnosis not present

## 2019-12-04 DIAGNOSIS — H16223 Keratoconjunctivitis sicca, not specified as Sjogren's, bilateral: Secondary | ICD-10-CM | POA: Diagnosis not present

## 2019-12-04 DIAGNOSIS — H5319 Other subjective visual disturbances: Secondary | ICD-10-CM | POA: Diagnosis not present

## 2019-12-04 DIAGNOSIS — H11002 Unspecified pterygium of left eye: Secondary | ICD-10-CM | POA: Diagnosis not present

## 2019-12-04 DIAGNOSIS — H40023 Open angle with borderline findings, high risk, bilateral: Secondary | ICD-10-CM | POA: Diagnosis not present

## 2019-12-04 DIAGNOSIS — H04123 Dry eye syndrome of bilateral lacrimal glands: Secondary | ICD-10-CM | POA: Diagnosis not present

## 2019-12-19 NOTE — Progress Notes (Signed)
[facility] Note  12/21/2019     CHIEF COMPLAINT Patient presents for Retina Follow Up   HISTORY OF PRESENT ILLNESS: TRASON [name] is a 74 y.o. male who presents to the clinic today for:   HPI    Retina Follow Up    In left eye.  This started months ago.  Severity is moderate.  Duration of months.  Since onset it is stable.  I, the attending physician,  performed the HPI with the patient and updated documentation appropriately.          Comments    Pt states vision is the same OU.  Pt denies has occasional ache in his left eye.  Pt denies any new or worsening floaters or fol OU.       Last edited by [name], MD on 12/23/2019  2:33 AM. (History)    pt had a consultation with Dr. Lucita Ferrara for cataract eval and states that Dr. Lucita Ferrara says his left eye double vision is from "slits" in his iris, pt states [facility] sent him to an optometrist who wanted to put him in color contacts to cover up the slits, but states the contact did not work, he says Immunologist also said he could get his cornea tattooed, pt then went to see Dr. Kathlen Mody for the double vision, Dr. Kathlen Mody referred pt to Dr. Nicki Reaper for a specialty contact lens, but pt decided not to go due to insurance not covering the lens, Dr. Kathlen Mody said the double vision could also be corrected with glasses, but wanted him to try Restasis first to get the cornea really moist, pt is on FML BID and Restasis BID  Referring physician: [name], MD [address],  [place] [address]  HISTORICAL INFORMATION:   Selected notes from the MEDICAL RECORD NUMBER Referred by Dr. Rutherford Guys for concern of possible endophthalmitis OS LEE: 05.29.20 [name]) Ocular Hx-s/p cat sx 05.27.20 PMH-   CURRENT MEDICATIONS: Current Outpatient Medications (Ophthalmic Drugs)  Medication Sig  . brimonidine (ALPHAGAN) 0.2 % ophthalmic solution Place 1 drop into the left eye 3 (three) times  daily. (Patient taking differently: Place 1 drop into the left eye at bedtime. )   No current facility-administered medications for this visit. (Ophthalmic Drugs)   Current Outpatient Medications (Other)  Medication Sig  . acyclovir (ZOVIRAX) 400 MG tablet TAKE 1 TABLET (400 MG TOTAL) BY MOUTH DAILY AS NEEDED (COLD SORES).  Marland Kitchen ARMOUR THYROID 60 MG tablet TAKE 1 TABLET DAILY (Patient taking differently: Take 60 mg by mouth daily before breakfast. )  . Ascorbic Acid (VITAMIN C) 1000 MG tablet Take 1,000 mg by mouth daily.    Marland Kitchen atorvastatin (LIPITOR) 10 MG tablet Take 10 mg by mouth at bedtime.   . cetirizine (ZYRTEC) 10 MG tablet Take 10 mg by mouth at bedtime as needed for allergies.   . Cholecalciferol (VITAMIN D) 1000 UNITS capsule Take 1,000 Units by mouth daily.    . Coenzyme Q10 100 MG capsule Take 100 mg by mouth daily.   . Cyanocobalamin (VITAMIN B-12 CR) 1500 MCG TBCR Take 1,500 mcg by mouth daily.   . fluticasone (FLONASE) 50 MCG/ACT nasal spray Place 1 spray into both nostrils daily as needed for allergies or rhinitis.  Marland Kitchen loratadine (CLARITIN) 10 MG tablet Take 10 mg by mouth daily as needed for allergies.   Marland Kitchen MAGNESIUM CITRATE PO Take 400 mg by mouth daily.   . tadalafil (CIALIS) 5  MG tablet Take 5 mg by mouth daily as needed for erectile dysfunction.   . vitamin E 400 UNIT capsule Take 400 Units by mouth daily.    . Zinc 50 MG CAPS Take 50 mg by mouth daily.    No current facility-administered medications for this visit. (Other)      REVIEW OF SYSTEMS: ROS    Positive for: Eyes   Negative for: Constitutional, Gastrointestinal, Neurological, Skin, Genitourinary, Musculoskeletal, HENT, Endocrine, Cardiovascular, Respiratory, Psychiatric, Allergic/Imm, Heme/Lymph   Last edited by [name] on 12/21/2019 10:22 AM. (History)       ALLERGIES Allergies  Allergen Reactions  . Dust Mite Extract     UNSPECIFIED REACTION   . Grass Extracts [Gramineae Pollens]      UNSPECIFIED REACTION     PAST MEDICAL HISTORY Past Medical History:  Diagnosis Date  . Back pain   . Cancer [facility]) 2014   prostate cancer  . Cataracts, bilateral   . Dysrhythmia    occasional PAC- asymptomatic, per MD pt cut back on caffeine  . Fungal nail infection   . Hearing loss    wear hearing aids  . HSV infection   . Hyperlipidemia   . Hypertension    no meds, dx yrs ago per patient but normal last several yrs  . Hypertensive retinopathy    OU  . Hypothyroidism   . Pneumonia    x 1   Past Surgical History:  Procedure Laterality Date  . ankle surgery trauma Right   . CATARACT EXTRACTION Left 11/29/2018   Dr. [name]  . COLONOSCOPY    . COSMETIC SURGERY     loose skin removed around neck area  . eye lid surgery    . EYE SURGERY    . fungal nail    . LASIK Bilateral   . PARS PLANA VITRECTOMY Left 03/01/2019   Procedure: PARS PLANA VITRECTOMY WITH 25 GAUGE;  Surgeon: [name], MD;  Location: [place];  Service: Ophthalmology;  Laterality: Left;  . REMOVAL RETAINED LENS Left 03/01/2019   Procedure: REMOVAL OF RETAINED LENS MATERIALS LEFT EYE;  Surgeon: [name], MD;  Location: [place];  Service: Ophthalmology;  Laterality: Left;  . WISDOM TOOTH EXTRACTION      FAMILY HISTORY Family History  Problem Relation Age of Onset  . Heart disease Mother   . Liver disease Father   . Alcohol abuse Father   . Cancer Father        hepatic cancer ?primary  . Arthritis Sister   . COPD Sister     SOCIAL HISTORY Social History   Tobacco Use  . Smoking status: Former Smoker    Types: Cigarettes    Quit date: 07/05/1981    Years since quitting: 38.4  . Smokeless tobacco: Never Used  Vaping Use  . Vaping Use: Never used  Substance Use Topics  . Alcohol use: Yes    Alcohol/week: 3.0 - 4.0 standard drinks    Types: 3 - 4 Glasses of wine per week  . Drug use: No         OPHTHALMIC EXAM:  Base Eye Exam    Visual Acuity (Snellen - Linear)      Right Left    Dist [place] 20/20 -1 20/40 -2   Dist ph [place]  20/20 -1   Correction: Glasses       Tonometry (Tonopen, 10:25 AM)      Right Left   Pressure 14 13  Pupils      Dark Light Shape React APD   Right 3 2 Round Brisk 0   Left            Visual Fields      Left Right    Full Full       Extraocular Movement      Right Left    Full Full       Neuro/Psych    Oriented x3: Yes   Mood/Affect: Normal       Dilation    Both eyes: 1.0% Mydriacyl, 2.5% Phenylephrine @ 10:26 AM        Slit Lamp and Fundus Exam    External Exam      Right Left   External  Periorbital Ecchymosis       Slit Lamp Exam      Right Left   Lids/Lashes Dermatochalasis - upper lid, Meibomian gland dysfunction Dermatochalasis - upper lid   Conjunctiva/Sclera Trace nasal Pinguecula mild nasal pterygeium, 1+ Injection   Cornea Arcus, 1+ Punctate epithelial erosions, barely visible lasik flap 1+ Punctate epithelial erosions, nasal pterygeium, lasik flap in good position    Anterior Chamber Deep and quiet Deep and quiet   Iris Round and dilated Round and dilated, mild TIDs at 0730-0800, ?pseudoexfoliative material pupil margin   Lens 2+ Nuclear sclerosis with early brunescence, 2+ Cortical cataract 3 piece sulcus IOL in good position and stable   Vitreous Vitreous syneresis post vitrectomy, clear, lens remnants gone       Fundus Exam      Right Left   Disc Pink and Sharp Pink and Sharp, focal PPP   C/D Ratio 0.2 0.2   Macula Flat, Blunted foveal reflex, Retinal pigment epithelial mottling, No heme or edema Flat, good foveal reflex, mild RPE changes, No heme or edema   Vessels Mild Vascular attenuation Mild Vascular attenuation   Periphery Attached    Attached, pigmented CR atrophy at 1030 / 0130, good laser changes          IMAGING AND PROCEDURES  Imaging and Procedures for @TODAY @  OCT, Retina - OU - Both Eyes       Right Eye Quality was good. Central Foveal Thickness: 305. Progression has been  stable. Findings include abnormal foveal contour, no IRF, no SRF, epiretinal membrane, macular pucker (Interval improvement in macular pucker).   Left Eye Quality was good. Central Foveal Thickness: 312. Progression has been stable. Findings include normal foveal contour, no IRF, no SRF (Interval improvement in vitreous opacities).   Notes *Images captured and stored on drive  Diagnosis / Impression:  OD: mild ERM with macular pucker -- improved from prior OS: NFP, no IRF, no SRF   Clinical management:  See below  Abbreviations: NFP - Normal foveal profile. CME - cystoid macular edema. PED - pigment epithelial detachment. IRF - intraretinal fluid. SRF - subretinal fluid. EZ - ellipsoid zone. ERM - epiretinal membrane. ORA - outer retinal atrophy. ORT - outer retinal tubulation. SRHM - subretinal hyper-reflective material                 ASSESSMENT/PLAN:    ICD-10-CM   1. Retained lens material following cataract surgery of left eye  H59.022   2. Ocular hypertension, left  H40.052   3. Epiretinal membrane (ERM) of right eye  H35.371   4. Retinal edema  H35.81 OCT, Retina - OU - Both Eyes  5. Essential hypertension  I10   6. Hypertensive retinopathy of  both eyes  H35.033   7. Combined forms of age-related cataract of right eye  H25.811   8. Pseudophakia  Z96.1   9. Ocular hypertension of left eye  H40.052   10. Monocular diplopia of left eye  H53.2   11. S/P LASIK (laser assisted in situ keratomileusis) of both eyes  Z98.890     1,2. Retained Lens Material with ocular hypertension OS  - complicated CE + sulcus IOL OS (5.27.20, Shapiro) -- posterior capsule rupture and anterior vitrectomy  - sulcus IOL in excellent position -- stable without pseudophakodonesis; +capsular phimosis/fibrosis  - of note, per Dr. Towanda Malkin report, pt had a history of pseudoexfoliation syndrome OS  - pre-op: persistent small cortical fragments in vitreous; small piece in inferior angle  resolved  - tMax 42 OS on POD2 in Dr. Kellie Moor office -- improved to 22 on [place]  - s/p PPV/EL/FAX OS, 08.27.20             - doing well -- BCVA 20/20  - retained lens material gone             - IOP okay at 36  - f/u 1 year-- POV, DFE, OCT  3,4. Epiretinal membrane, right eye  - mild ERM  - asymptomatic, no metamorphopsia  - no indication for surgery at this time  - monitor  5,6. Hypertensive retinopathy OU  - discussed importance of tight BP control  - monitor  7. Mixed form age related cataract OD  - now under the expert management of Dr. Kathlen Mody  8. Pseudophakia OS  - s/p CE + sulcus IOL (5.27.20, Dr. [name])  - IOL in good position  - retained lens material as above  9. Ocular Hypertension OS  - IOP 35 OS noted at Dr. Towanda Malkin office on Monday (10.05.20)   - IOP improved to 13 today -- ?Durezol steroid response?  - brimonidine QD   - Ilevro PRN OS  - of note, per Dr. Towanda Malkin report, pt had a history of pseudoexfoliation syndrome OS  - Gonio 12.18.20 shows 3+ pigment in TM  10,11. Monocular diplopia OS  - pt reports horizontal "shadowing" OS  - likely related to IOL + history of LASIK  - pt saw Dr. Lucita Ferrara, but is now following with Dr. Kathlen Mody   Ophthalmic Meds Ordered this visit:  No orders of the defined types were placed in this encounter.      Return in about 1 year (around 12/20/2020) for f/u retained lens material OS, DFE, OCT.  There are no Patient Instructions on file for this visit.   Explained the diagnoses, plan, and follow up with the patient and they expressed understanding.  Patient expressed understanding of the importance of proper follow up care.   This document serves as a record of services personally performed by Gardiner Sleeper, MD, PhD. It was created on their behalf by Ernest Mallick, OA, an ophthalmic assistant. The creation of this record is the provider's dictation and/or activities during the visit.    Electronically signed by:  Ernest Mallick, OA 06.16.2021 2:36 AM   Gardiner Sleeper, M.D., Ph.D. Diseases & Surgery of the Retina and Vitreous [facility]  I have reviewed the above documentation for accuracy and completeness, and I agree with the above. Gardiner Sleeper, M.D., Ph.D. 12/23/19 2:36 AM   Abbreviations: M myopia (nearsighted); A astigmatism; H hyperopia (farsighted); P presbyopia; Mrx spectacle prescription;  CTL contact lenses; OD right eye; OS left eye;  OU both eyes  XT exotropia; ET esotropia; PEK punctate epithelial keratitis; PEE punctate epithelial erosions; DES dry eye syndrome; MGD meibomian gland dysfunction; ATs artificial tears; PFAT's preservative free artificial tears; [place] nuclear sclerotic cataract; PSC posterior subcapsular cataract; ERM epi-retinal membrane; PVD posterior vitreous detachment; RD retinal detachment; DM diabetes mellitus; DR diabetic retinopathy; NPDR non-proliferative diabetic retinopathy; PDR proliferative diabetic retinopathy; CSME clinically significant macular edema; DME diabetic macular edema; dbh dot blot hemorrhages; CWS cotton wool spot; POAG primary open angle glaucoma; C/D cup-to-disc ratio; HVF humphrey visual field; GVF goldmann visual field; OCT optical coherence tomography; IOP intraocular pressure; BRVO Branch retinal vein occlusion; CRVO central retinal vein occlusion; CRAO central retinal artery occlusion; BRAO branch retinal artery occlusion; RT retinal tear; SB scleral buckle; PPV pars plana vitrectomy; VH Vitreous hemorrhage; PRP panretinal laser photocoagulation; IVK intravitreal kenalog; VMT vitreomacular traction; MH Macular hole;  NVD neovascularization of the disc; NVE neovascularization elsewhere; AREDS age related eye disease study; ARMD age related macular degeneration; POAG primary open angle glaucoma; EBMD epithelial/anterior basement membrane dystrophy; ACIOL anterior chamber intraocular lens; IOL intraocular lens; PCIOL posterior  chamber intraocular lens; Phaco/IOL phacoemulsification with intraocular lens placement; [place] photorefractive keratectomy; LASIK laser assisted in situ keratomileusis; HTN hypertension; DM diabetes mellitus; COPD chronic obstructive pulmonary disease

## 2019-12-21 ENCOUNTER — Other Ambulatory Visit: Payer: Self-pay

## 2019-12-21 ENCOUNTER — Ambulatory Visit (INDEPENDENT_AMBULATORY_CARE_PROVIDER_SITE_OTHER): Payer: Medicare Other | Admitting: Ophthalmology

## 2019-12-21 DIAGNOSIS — H40052 Ocular hypertension, left eye: Secondary | ICD-10-CM | POA: Diagnosis not present

## 2019-12-21 DIAGNOSIS — H59022 Cataract (lens) fragments in eye following cataract surgery, left eye: Secondary | ICD-10-CM

## 2019-12-21 DIAGNOSIS — H3581 Retinal edema: Secondary | ICD-10-CM

## 2019-12-21 DIAGNOSIS — I10 Essential (primary) hypertension: Secondary | ICD-10-CM

## 2019-12-21 DIAGNOSIS — H25811 Combined forms of age-related cataract, right eye: Secondary | ICD-10-CM

## 2019-12-21 DIAGNOSIS — H532 Diplopia: Secondary | ICD-10-CM

## 2019-12-21 DIAGNOSIS — H35371 Puckering of macula, right eye: Secondary | ICD-10-CM

## 2019-12-21 DIAGNOSIS — Z961 Presence of intraocular lens: Secondary | ICD-10-CM

## 2019-12-21 DIAGNOSIS — H35033 Hypertensive retinopathy, bilateral: Secondary | ICD-10-CM

## 2019-12-21 DIAGNOSIS — Z9889 Other specified postprocedural states: Secondary | ICD-10-CM

## 2019-12-23 ENCOUNTER — Encounter (INDEPENDENT_AMBULATORY_CARE_PROVIDER_SITE_OTHER): Payer: Self-pay | Admitting: Ophthalmology

## 2020-02-06 ENCOUNTER — Other Ambulatory Visit: Payer: Self-pay | Admitting: Family Medicine

## 2020-03-06 DIAGNOSIS — H16223 Keratoconjunctivitis sicca, not specified as Sjogren's, bilateral: Secondary | ICD-10-CM | POA: Diagnosis not present

## 2020-03-06 DIAGNOSIS — H04123 Dry eye syndrome of bilateral lacrimal glands: Secondary | ICD-10-CM | POA: Diagnosis not present

## 2020-03-06 DIAGNOSIS — H5319 Other subjective visual disturbances: Secondary | ICD-10-CM | POA: Diagnosis not present

## 2020-03-06 DIAGNOSIS — H40023 Open angle with borderline findings, high risk, bilateral: Secondary | ICD-10-CM | POA: Diagnosis not present

## 2020-04-02 DIAGNOSIS — Z23 Encounter for immunization: Secondary | ICD-10-CM | POA: Diagnosis not present

## 2020-04-03 ENCOUNTER — Encounter: Payer: Self-pay | Admitting: Family Medicine

## 2020-04-03 DIAGNOSIS — G8929 Other chronic pain: Secondary | ICD-10-CM

## 2020-04-15 ENCOUNTER — Telehealth: Payer: Self-pay | Admitting: Family Medicine

## 2020-04-15 NOTE — Telephone Encounter (Signed)
Left message for patient to schedule Annual Wellness Visit.  Please schedule with Nurse Health Advisor Shannon Crews, RN at [facility]  

## 2020-04-19 DIAGNOSIS — M546 Pain in thoracic spine: Secondary | ICD-10-CM | POA: Diagnosis not present

## 2020-04-19 DIAGNOSIS — M5416 Radiculopathy, lumbar region: Secondary | ICD-10-CM | POA: Diagnosis not present

## 2020-04-19 DIAGNOSIS — M545 Low back pain, unspecified: Secondary | ICD-10-CM | POA: Diagnosis not present

## 2020-04-21 ENCOUNTER — Other Ambulatory Visit: Payer: Self-pay | Admitting: Orthopedic Surgery

## 2020-04-21 DIAGNOSIS — M545 Low back pain, unspecified: Secondary | ICD-10-CM

## 2020-05-07 ENCOUNTER — Other Ambulatory Visit: Payer: Self-pay | Admitting: Family Medicine

## 2020-05-12 ENCOUNTER — Other Ambulatory Visit: Payer: Self-pay

## 2020-05-12 ENCOUNTER — Ambulatory Visit
Admission: RE | Admit: 2020-05-12 | Discharge: 2020-05-12 | Disposition: A | Discharge status: Home / Self Care | Payer: Medicare Other | Source: Ambulatory Visit | Attending: Orthopedic Surgery | Admitting: Orthopedic Surgery

## 2020-05-12 DIAGNOSIS — M545 Low back pain, unspecified: Secondary | ICD-10-CM

## 2020-05-12 DIAGNOSIS — M48061 Spinal stenosis, lumbar region without neurogenic claudication: Secondary | ICD-10-CM | POA: Diagnosis not present

## 2020-05-17 ENCOUNTER — Ambulatory Visit: Payer: Medicare Other

## 2020-05-17 ENCOUNTER — Ambulatory Visit: Payer: Medicare Other | Attending: Internal Medicine

## 2020-05-17 DIAGNOSIS — Z23 Encounter for immunization: Secondary | ICD-10-CM

## 2020-05-17 NOTE — Progress Notes (Signed)
   Covid-19 Vaccination Clinic  Name:  CRUZ BONG    MRN: [id number] DOB: 08-Aug-1945  05/17/2020  Mr. Diles was observed post Covid-19 immunization for 15 minutes without incident. He was provided with Vaccine Information Sheet and instruction to access the V-Safe system.   Mr. Robinette was instructed to call 911 with any severe reactions post vaccine: Marland Kitchen Difficulty breathing  . Swelling of face and throat  . A fast heartbeat  . A bad rash all over body  . Dizziness and weakness   Immunizations Administered    No immunizations on file.

## 2020-05-23 DIAGNOSIS — M48062 Spinal stenosis, lumbar region with neurogenic claudication: Secondary | ICD-10-CM | POA: Diagnosis not present

## 2020-05-26 DIAGNOSIS — M5416 Radiculopathy, lumbar region: Secondary | ICD-10-CM | POA: Diagnosis not present

## 2020-05-27 DIAGNOSIS — C61 Malignant neoplasm of prostate: Secondary | ICD-10-CM | POA: Diagnosis not present

## 2020-06-03 DIAGNOSIS — H5319 Other subjective visual disturbances: Secondary | ICD-10-CM | POA: Diagnosis not present

## 2020-06-03 DIAGNOSIS — H04123 Dry eye syndrome of bilateral lacrimal glands: Secondary | ICD-10-CM | POA: Diagnosis not present

## 2020-06-03 DIAGNOSIS — H16223 Keratoconjunctivitis sicca, not specified as Sjogren's, bilateral: Secondary | ICD-10-CM | POA: Diagnosis not present

## 2020-06-04 ENCOUNTER — Telehealth: Payer: Self-pay | Admitting: Family Medicine

## 2020-06-04 NOTE — Telephone Encounter (Signed)
Advised patient of providers message

## 2020-06-04 NOTE — Telephone Encounter (Signed)
The patient had an MRI done 11/08 and he has a renal cyst on his kidney.  He has an appointment Friday to see his Urologist but was wondering if he should be seeing a Nephrologist since he has a cyst on his kidney.  Please advise

## 2020-06-04 NOTE — Telephone Encounter (Signed)
Renal cysts are very common and usually benign.  I think the only reason to see a nephrologist would be if there was concern for polycystic kidney disease.  Most benign appearing cysts we do not refer to urology but urologists are the type of physician that would evaluate for any kind of renal mass as opposed to nephrologist

## 2020-06-06 DIAGNOSIS — N5201 Erectile dysfunction due to arterial insufficiency: Secondary | ICD-10-CM | POA: Diagnosis not present

## 2020-06-06 DIAGNOSIS — C61 Malignant neoplasm of prostate: Secondary | ICD-10-CM | POA: Diagnosis not present

## 2020-06-18 DIAGNOSIS — M5416 Radiculopathy, lumbar region: Secondary | ICD-10-CM | POA: Diagnosis not present

## 2020-07-02 DIAGNOSIS — R0683 Snoring: Secondary | ICD-10-CM | POA: Diagnosis not present

## 2020-07-02 DIAGNOSIS — G4719 Other hypersomnia: Secondary | ICD-10-CM | POA: Diagnosis not present

## 2020-07-02 DIAGNOSIS — R0681 Apnea, not elsewhere classified: Secondary | ICD-10-CM | POA: Diagnosis not present

## 2020-07-14 DIAGNOSIS — M47816 Spondylosis without myelopathy or radiculopathy, lumbar region: Secondary | ICD-10-CM | POA: Diagnosis not present

## 2020-07-23 DIAGNOSIS — G4700 Insomnia, unspecified: Secondary | ICD-10-CM | POA: Diagnosis not present

## 2020-07-23 DIAGNOSIS — R0683 Snoring: Secondary | ICD-10-CM | POA: Diagnosis not present

## 2020-07-23 DIAGNOSIS — G4710 Hypersomnia, unspecified: Secondary | ICD-10-CM | POA: Diagnosis not present

## 2020-07-24 DIAGNOSIS — M47816 Spondylosis without myelopathy or radiculopathy, lumbar region: Secondary | ICD-10-CM | POA: Diagnosis not present

## 2020-08-02 ENCOUNTER — Other Ambulatory Visit: Payer: Self-pay | Admitting: Family Medicine

## 2020-08-22 LAB — BASIC METABOLIC PANEL
BUN: 12 (ref 4–21)
CO2: 27 — AB (ref 13–22)
Chloride: 105 (ref 99–108)
Creatinine: 1 (ref 0.6–1.3)
Glucose: 94
Potassium: 4.2 (ref 3.4–5.3)
Sodium: 135 — AB (ref 137–147)

## 2020-08-22 LAB — CBC AND DIFFERENTIAL
HCT: 49 (ref 41–53)
Hemoglobin: 16.6 (ref 13.5–17.5)
Platelets: 224 (ref 150–399)
WBC: 5.4

## 2020-08-22 LAB — VITAMIN D 25 HYDROXY (VIT D DEFICIENCY, FRACTURES): Vit D, 25-Hydroxy: 71.13

## 2020-08-22 LAB — CBC: RBC: 5.63 — AB (ref 3.87–5.11)

## 2020-08-22 LAB — HEPATIC FUNCTION PANEL
ALT: 46 — AB (ref 10–40)
AST: 28 (ref 14–40)
Alkaline Phosphatase: 90 (ref 25–125)
Bilirubin, Direct: 0.2 (ref 0.01–0.4)
Bilirubin, Total: 0.7

## 2020-08-22 LAB — COMPREHENSIVE METABOLIC PANEL
Albumin: 4 (ref 3.5–5.0)
Calcium: 9.8 (ref 8.7–10.7)

## 2020-08-22 LAB — LIPID PANEL
Cholesterol: 193 (ref 0–200)
HDL: 77 — AB (ref 35–70)
LDL Cholesterol: 90
Triglycerides: 130 (ref 40–160)

## 2020-09-16 ENCOUNTER — Other Ambulatory Visit: Payer: Self-pay

## 2020-09-16 ENCOUNTER — Encounter: Payer: Self-pay | Admitting: Family Medicine

## 2020-09-16 ENCOUNTER — Ambulatory Visit (INDEPENDENT_AMBULATORY_CARE_PROVIDER_SITE_OTHER): Payer: Medicare Other | Admitting: Family Medicine

## 2020-09-16 VITALS — BP 126/70 | HR 80 | Temp 98.1°F | Ht 67.5 in | Wt 164.0 lb

## 2020-09-16 DIAGNOSIS — Z8619 Personal history of other infectious and parasitic diseases: Secondary | ICD-10-CM

## 2020-09-16 DIAGNOSIS — E785 Hyperlipidemia, unspecified: Secondary | ICD-10-CM | POA: Diagnosis not present

## 2020-09-16 DIAGNOSIS — E039 Hypothyroidism, unspecified: Secondary | ICD-10-CM | POA: Diagnosis not present

## 2020-09-16 DIAGNOSIS — I10 Essential (primary) hypertension: Secondary | ICD-10-CM | POA: Diagnosis not present

## 2020-09-16 DIAGNOSIS — M47816 Spondylosis without myelopathy or radiculopathy, lumbar region: Secondary | ICD-10-CM | POA: Diagnosis not present

## 2020-09-16 MED ORDER — VALACYCLOVIR HCL 500 MG PO TABS: ORAL_TABLET | ORAL | 2 refills | Status: DC

## 2020-09-16 NOTE — Progress Notes (Signed)
Established Patient Office Visit  Subjective:  Patient ID: Ryan [name], male    DOB: 1946/03/21  Age: 75 y.o. MRN: [id number]  CC:  Chief Complaint  Patient presents with  . Follow-up    HPI Ryan [name] presents for medical follow-up.  He is followed closely by the [place].  He had recent lab work there and brings in a copy for review.  Everything looks fairly stable.  His PSA was in the 3 range.  He has history of hypertension, hypogonadism, hypothyroidism, hyperlipidemia, prostate cancer, hyperlipidemia  He does have history of herpes and has taken acyclovir periodically for flareups.  He would like to consider Valtrex.  He did recently have some outbreak suprapubic area which was a new location.  He is on atorvastatin 10 mg daily for hyperlipidemia and takes Armour Thyroid 60 mg daily for hypothyroidism.  Recent labs through the [place] showed these were stable.    Past Medical History:  Diagnosis Date  . Back pain   . Cancer [facility]) 2014   prostate cancer  . Cataracts, bilateral   . Dysrhythmia    occasional PAC- asymptomatic, per MD pt cut back on caffeine  . Fungal nail infection   . Hearing loss    wear hearing aids  . HSV infection   . Hyperlipidemia   . Hypertension    no meds, dx yrs ago per patient but normal last several yrs  . Hypertensive retinopathy    OU  . Hypothyroidism   . Pneumonia    x 1    Past Surgical History:  Procedure Laterality Date  . ankle surgery trauma Right   . CATARACT EXTRACTION Left 11/29/2018   Dr. [name]  . COLONOSCOPY    . COSMETIC SURGERY     loose skin removed around neck area  . eye lid surgery    . EYE SURGERY    . fungal nail    . LASIK Bilateral   . PARS PLANA VITRECTOMY Left 03/01/2019   Procedure: PARS PLANA VITRECTOMY WITH 25 GAUGE;  Surgeon: [name], MD;  Location: [place];  Service: Ophthalmology;  Laterality: Left;  . REMOVAL RETAINED LENS Left 03/01/2019   Procedure: REMOVAL OF RETAINED LENS MATERIALS  LEFT EYE;  Surgeon: [name], MD;  Location: [place];  Service: Ophthalmology;  Laterality: Left;  . WISDOM TOOTH EXTRACTION      Family History  Problem Relation Age of Onset  . Heart disease Mother   . Liver disease Father   . Alcohol abuse Father   . Cancer Father        hepatic cancer ?primary  . Arthritis Sister   . COPD Sister     Social History   Socioeconomic History  . Marital status: Married    Spouse name: Not on file  . Number of children: Not on file  . Years of education: Not on file  . Highest education level: Not on file  Occupational History  . Not on file  Tobacco Use  . Smoking status: Former Smoker    Types: Cigarettes    Quit date: 07/05/1981    Years since quitting: 39.2  . Smokeless tobacco: Never Used  Vaping Use  . Vaping Use: Never used  Substance and Sexual Activity  . Alcohol use: Yes    Alcohol/week: 3.0 - 4.0 standard drinks    Types: 3 - 4 Glasses of wine per week  . Drug use: No  . Sexual activity: Not on file  Other Topics Concern  . Not on file  Social History Narrative  . Not on file   Social Determinants of Health   Financial Resource Strain: Not on file  Food Insecurity: Not on file  Transportation Needs: Not on file  Physical Activity: Not on file  Stress: Not on file  Social Connections: Not on file  Intimate Partner Violence: Not on file    Outpatient Medications Prior to Visit  Medication Sig Dispense Refill  . ARMOUR THYROID 60 MG tablet TAKE 1 TABLET DAILY (Patient taking differently: Take 60 mg by mouth daily before breakfast.) 90 tablet 1  . Ascorbic Acid (VITAMIN C) 1000 MG tablet Take 1,000 mg by mouth daily.    Marland Kitchen atorvastatin (LIPITOR) 10 MG tablet Take 10 mg by mouth at bedtime.     . brimonidine (ALPHAGAN) 0.2 % ophthalmic solution Place 1 drop into the left eye 3 (three) times daily. (Patient taking differently: Place 1 drop into the left eye at bedtime.) 10 mL 10  . cetirizine (ZYRTEC) 10 MG tablet Take  10 mg by mouth at bedtime as needed for allergies.     . Cholecalciferol (VITAMIN D) 1000 UNITS capsule Take 1,000 Units by mouth daily.    . Coenzyme Q10 100 MG capsule Take 100 mg by mouth daily.    . Cyanocobalamin (VITAMIN B-12 CR) 1500 MCG TBCR Take 1,500 mcg by mouth daily.    . fluticasone (FLONASE) 50 MCG/ACT nasal spray Place 1 spray into both nostrils daily as needed for allergies or rhinitis.    Marland Kitchen loratadine (CLARITIN) 10 MG tablet Take 10 mg by mouth daily as needed for allergies.     Marland Kitchen MAGNESIUM CITRATE PO Take 400 mg by mouth daily.     . tadalafil (CIALIS) 5 MG tablet Take 5 mg by mouth daily as needed for erectile dysfunction.     . vitamin E 400 UNIT capsule Take 400 Units by mouth daily.    . Zinc 50 MG CAPS Take 50 mg by mouth daily.    Marland Kitchen acyclovir (ZOVIRAX) 400 MG tablet TAKE 1 TABLET (400 MG TOTAL) BY MOUTH DAILY AS NEEDED (FOR COLD SORES). 90 tablet 0   No facility-administered medications prior to visit.    Allergies  Allergen Reactions  . Dust Mite Extract     UNSPECIFIED REACTION   . Grass Extracts [Gramineae Pollens]     UNSPECIFIED REACTION     ROS Review of Systems  Constitutional: Negative for fatigue and unexpected weight change.  Eyes: Negative for visual disturbance.  Respiratory: Negative for cough, chest tightness and shortness of breath.   Cardiovascular: Negative for chest pain, palpitations and leg swelling.  Neurological: Negative for dizziness, syncope, weakness, light-headedness and headaches.      Objective:    Physical Exam Vitals reviewed.  Constitutional:      Appearance: Normal appearance.  Cardiovascular:     Rate and Rhythm: Normal rate and regular rhythm.  Pulmonary:     Effort: Pulmonary effort is normal.     Breath sounds: Normal breath sounds.  Musculoskeletal:     Cervical back: Neck supple.     Right lower leg: No edema.     Left lower leg: No edema.  Lymphadenopathy:     Cervical: No cervical adenopathy.   Neurological:     Mental Status: He is alert.     BP 126/70   Pulse 80   Temp 98.1 F (36.7 C) (Oral)   Ht 5' 7.5" (1.715 m)  Wt 164 lb (74.4 kg)   SpO2 97%   BMI 25.31 kg/m  Wt Readings from Last 3 Encounters:  09/16/20 164 lb (74.4 kg)  09/04/19 164 lb 1 oz (74.4 kg)  03/01/19 160 lb (72.6 kg)     There are no preventive care reminders to display for this patient.  There are no preventive care reminders to display for this patient.  Lab Results  Component Value Date   TSH 0.71 08/20/2016   Lab Results  Component Value Date   WBC 5.4 08/22/2020   HGB 16.6 08/22/2020   HCT 49 08/22/2020   MCV 88.7 03/01/2019   PLT 224 08/22/2020   Lab Results  Component Value Date   NA 135 (A) 08/22/2020   K 4.2 08/22/2020   CO2 27 (A) 08/22/2020   GLUCOSE 94 03/01/2019   BUN 12 08/22/2020   CREATININE 1.0 08/22/2020   BILITOT 1.0 03/01/2018   ALKPHOS 90 08/22/2020   AST 28 08/22/2020   ALT 46 (A) 08/22/2020   PROT 6.9 03/01/2018   ALBUMIN 4.0 08/22/2020   CALCIUM 9.8 08/22/2020   ANIONGAP 10 03/01/2019   GFR 92.27 01/01/2013   Lab Results  Component Value Date   CHOL 193 08/22/2020   Lab Results  Component Value Date   HDL 77 (A) 08/22/2020   Lab Results  Component Value Date   LDLCALC 90 08/22/2020   Lab Results  Component Value Date   TRIG 130 08/22/2020   Lab Results  Component Value Date   CHOLHDL 3 03/01/2018   No results found for: HGBA1C    Assessment & Plan:   #1 history of type II herpes. -Send in prescription for Valtrex 500 mg p.o. twice daily for 3 days as needed for herpes flareup  #2 hypertension history.  Currently stable off medications.  Continue to monitor  #3 hyperlipidemia treated with atorvastatin 10 mg daily.  Recent labs from [facility] hospital reviewed and stable -Continue Lipitor 10 mg daily  #4 hypothyroidism.  Recent TSH at goal through [place] labs -Continue Armour Thyroid 60 mg daily  Meds ordered this encounter   Medications  . valACYclovir (VALTREX) 500 MG tablet    Sig: Take one tablet by mouth twice daily for 3 days as needed for herpes flare    Dispense:  60 tablet    Refill:  2    Follow-up: No follow-ups on file.    Carolann Littler, MD

## 2020-10-21 DIAGNOSIS — M47816 Spondylosis without myelopathy or radiculopathy, lumbar region: Secondary | ICD-10-CM | POA: Diagnosis not present

## 2020-11-12 DIAGNOSIS — M47816 Spondylosis without myelopathy or radiculopathy, lumbar region: Secondary | ICD-10-CM | POA: Diagnosis not present

## 2020-11-19 ENCOUNTER — Other Ambulatory Visit: Payer: Self-pay

## 2020-11-19 ENCOUNTER — Ambulatory Visit (INDEPENDENT_AMBULATORY_CARE_PROVIDER_SITE_OTHER): Payer: Medicare Other

## 2020-11-19 VITALS — BP 122/62 | HR 79 | Temp 97.9°F | Ht 69.0 in | Wt 165.0 lb

## 2020-11-19 DIAGNOSIS — Z0000 Encounter for general adult medical examination without abnormal findings: Secondary | ICD-10-CM | POA: Diagnosis not present

## 2020-11-19 NOTE — Progress Notes (Signed)
Subjective:   Ryan [name] is a 75 y.o. male who presents for an Initial Medicare Annual Wellness Visit.  Review of Systems    n/a Cardiac Risk Factors include: advanced age (>64men, >42 women);dyslipidemia;hypertension;male gender     Objective:    Today's Vitals   11/19/20 1033  BP: (!) 142/72  Pulse: 79  Temp: 97.9 F (36.6 C)  SpO2: 99%  Weight: 193 lb (87.5 kg)  Height: 5\' 9"  (1.753 m)   Body mass index is 28.5 kg/m.  Advanced Directives 11/19/2020  Does Patient Have a Medical Advance Directive? No  Would patient like information on creating a medical advance directive? No - Patient declined    Current Medications (verified) Outpatient Encounter Medications as of 11/19/2020  Medication Sig  . ARMOUR THYROID 60 MG tablet TAKE 1 TABLET DAILY (Patient taking differently: Take 60 mg by mouth daily before breakfast.)  . Ascorbic Acid (VITAMIN C) 1000 MG tablet Take 1,000 mg by mouth daily.  Marland Kitchen atorvastatin (LIPITOR) 10 MG tablet Take 10 mg by mouth at bedtime.   . cetirizine (ZYRTEC) 10 MG tablet Take 10 mg by mouth at bedtime as needed for allergies.   . Cholecalciferol (VITAMIN D) 1000 UNITS capsule Take 1,000 Units by mouth daily.  . Coenzyme Q10 100 MG capsule Take 100 mg by mouth daily.  . Cyanocobalamin (VITAMIN B-12 CR) 1500 MCG TBCR Take 1,500 mcg by mouth daily.  . fluticasone (FLONASE) 50 MCG/ACT nasal spray Place 1 spray into both nostrils daily as needed for allergies or rhinitis.  Marland Kitchen loratadine (CLARITIN) 10 MG tablet Take 10 mg by mouth daily as needed for allergies.   Marland Kitchen MAGNESIUM CITRATE PO Take 400 mg by mouth daily.   . RESTASIS 0.05 % ophthalmic emulsion Place 1 drop into the left eye 2 (two) times daily.  . tadalafil (CIALIS) 5 MG tablet Take 5 mg by mouth daily as needed for erectile dysfunction.   . valACYclovir (VALTREX) 500 MG tablet Take one tablet by mouth twice daily for 3 days as needed for herpes flare  . vitamin E 400 UNIT capsule Take 400  Units by mouth daily.  . Zinc 50 MG CAPS Take 50 mg by mouth daily.  . brimonidine (ALPHAGAN) 0.2 % ophthalmic solution Place 1 drop into the left eye 3 (three) times daily. (Patient not taking: Reported on 11/19/2020)   No facility-administered encounter medications on file as of 11/19/2020.    Allergies (verified) Dust mite extract and Grass extracts [gramineae pollens]   History: Past Medical History:  Diagnosis Date  . Back pain   . Cancer [facility]) 2014   prostate cancer  . Cataracts, bilateral   . Dysrhythmia    occasional PAC- asymptomatic, per MD pt cut back on caffeine  . Fungal nail infection   . Hearing loss    wear hearing aids  . HSV infection   . Hyperlipidemia   . Hypertension    no meds, dx yrs ago per patient but normal last several yrs  . Hypertensive retinopathy    OU  . Hypothyroidism   . Pneumonia    x 1   Past Surgical History:  Procedure Laterality Date  . ankle surgery trauma Right   . CATARACT EXTRACTION Left 11/29/2018   Dr. [name]  . COLONOSCOPY    . COSMETIC SURGERY     loose skin removed around neck area  . eye lid surgery    . EYE SURGERY    . fungal nail    .  LASIK Bilateral   . PARS PLANA VITRECTOMY Left 03/01/2019   Procedure: PARS PLANA VITRECTOMY WITH 25 GAUGE;  Surgeon: [name], MD;  Location: [place];  Service: Ophthalmology;  Laterality: Left;  . REMOVAL RETAINED LENS Left 03/01/2019   Procedure: REMOVAL OF RETAINED LENS MATERIALS LEFT EYE;  Surgeon: [name], MD;  Location: [place];  Service: Ophthalmology;  Laterality: Left;  . WISDOM TOOTH EXTRACTION     Family History  Problem Relation Age of Onset  . Heart disease Mother   . Liver disease Father   . Alcohol abuse Father   . Cancer Father        hepatic cancer ?primary  . Arthritis Sister   . COPD Sister    Social History   Socioeconomic History  . Marital status: Married    Spouse name: Not on file  . Number of children: Not on file  . Years of education: Not  on file  . Highest education level: Not on file  Occupational History  . Not on file  Tobacco Use  . Smoking status: Former Smoker    Types: Cigarettes    Quit date: 07/05/1981    Years since quitting: 39.4  . Smokeless tobacco: Never Used  Vaping Use  . Vaping Use: Never used  Substance and Sexual Activity  . Alcohol use: Yes    Alcohol/week: 3.0 - 4.0 standard drinks    Types: 3 - 4 Glasses of wine per week  . Drug use: No  . Sexual activity: Not on file  Other Topics Concern  . Not on file  Social History Narrative  . Not on file   Social Determinants of Health   Financial Resource Strain: Low Risk   . Difficulty of Paying Living Expenses: Not hard at all  Food Insecurity: No Food Insecurity  . Worried About Charity fundraiser in the Last Year: Never true  . Ran Out of Food in the Last Year: Never true  Transportation Needs: No Transportation Needs  . Lack of Transportation (Medical): No  . Lack of Transportation (Non-Medical): No  Physical Activity: Sufficiently Active  . Days of Exercise per Week: 5 days  . Minutes of Exercise per Session: 60 min  Stress: No Stress Concern Present  . Feeling of Stress : Not at all  Social Connections: Socially Integrated  . Frequency of Communication with Friends and Family: More than three times a week  . Frequency of Social Gatherings with Friends and Family: More than three times a week  . Attends Religious Services: More than 4 times per year  . Active Member of Clubs or Organizations: Yes  . Attends Archivist Meetings: More than 4 times per year  . Marital Status: Married    Tobacco Counseling Counseling given: Not Answered   Clinical Intake:  Pre-visit preparation completed: Yes  Pain : No/denies pain     Nutritional Risks: None Diabetes: No  How often do you need to have someone help you when you read instructions, pamphlets, or other written materials from your doctor or pharmacy?: 1 - Never What  is the last grade level you completed in school?: college  Diabetic?no  Interpreter Needed?: No  Information entered by :: [place] of Daily Living In your present state of health, do you have any difficulty performing the following activities: 11/19/2020  Hearing? N  Vision? N  Difficulty concentrating or making decisions? N  Walking or climbing stairs? N  Dressing or bathing?  N  Doing errands, shopping? N  Preparing Food and eating ? N  Using the Toilet? N  In the past six months, have you accidently leaked urine? N  Do you have problems with loss of bowel control? N  Managing your Medications? N  Managing your Finances? N  Housekeeping or managing your Housekeeping? N  Some recent data might be hidden    Patient Care Team: [name], MD as PCP - General (Family Medicine)  Indicate any recent Medical Services you may have received from other than [facility] providers in the past year (date may be approximate).     Assessment:   This is a routine wellness examination for [employer].  Hearing/Vision screen No exam data present  Dietary issues and exercise activities discussed: Current Exercise Habits: Home exercise routine, Type of exercise: treadmill, Time (Minutes): 60, Frequency (Times/Week): 5, Weekly Exercise (Minutes/Week): 300, Intensity: Mild, Exercise limited by: None identified  Goals Addressed            This Visit's Progress   . Exercise 3x per week (30 min per time)        Depression Screen PHQ 2/9 Scores 11/19/2020 11/19/2020 09/05/2018 08/29/2017 08/20/2016 02/14/2014 01/01/2013  PHQ - 2 Score 0 0 0 0 0 0 1  PHQ- 9 Score - - 0 - - - -    Fall Risk Fall Risk  09/05/2018 08/29/2017 08/20/2016 02/14/2014 01/01/2013  Falls in the past year? 0 No No No No    FALL RISK PREVENTION PERTAINING TO THE HOME:  Any stairs in or around the home? Yes  If so, are there any without handrails? No  Home free of loose throw rugs in walkways, pet beds,  electrical cords, etc? Yes  Adequate lighting in your home to reduce risk of falls? Yes   ASSISTIVE DEVICES UTILIZED TO PREV ENT FALLS:  Life alert? No  Use of a cane, walker or w/c? Yes  Grab bars in the bathroom? Yes  Shower chair or bench in shower? Yes  Elevated toilet seat or a handicapped toilet? Yes   TIMED UP AND GO:  Was the test performed? Yes .  Length of time to ambulate 10 feet: 6 sec.   Gait steady and fast without use of assistive device  Cognitive Function:   Normal cognitive status assessed by direct observation by this Nurse Health Advisor. No abnormalities found.        Immunizations Immunization History  Administered Date(s) Administered  . Influenza, High Dose Seasonal PF 05/29/2018  . Moderna SARS-COV2 Booster Vaccination 05/17/2020  . Pneumococcal Conjugate-13 01/07/2015  . Pneumococcal Polysaccharide-23 07/16/2013  . Td 01/06/1999, 11/25/2008  . Zoster 02/19/2011    TDAP status: Due, Education has been provided regarding the importance of this vaccine. Advised may receive this vaccine at local pharmacy or Health Dept. Aware to provide a copy of the vaccination record if obtained from local pharmacy or Health Dept. Verbalized acceptance and understanding.  Flu Vaccine status: Up to date  Pneumococcal vaccine status: Up to date  Covid-19 vaccine status: Completed vaccines  Qualifies for Shingles Vaccine? Yes   Zostavax completed No   Shingrix Completed?: Yes  Screening Tests Health Maintenance  Topic Date Due  . COVID-19 Vaccine (4 - Booster for Moderna series) 08/17/2020  . COLONOSCOPY (Pts 45-76yrs Insurance coverage will need to be confirmed)  09/14/2022 (Originally 04/25/2017)  . INFLUENZA VACCINE  02/02/2021  . TETANUS/TDAP  09/03/2028  . Hepatitis C Screening  Completed  . PNA vac  Low Risk Adult  Completed  . HPV VACCINES  Aged Out    Health Maintenance  Health Maintenance Due  Topic Date Due  . COVID-19 Vaccine (4 - Booster  for Moderna series) 08/17/2020    Colorectal cancer screening: Type of screening: Cologuard. Completed 12/12/2019. Repeat every 3 years  Lung Cancer Screening: (Low Dose CT Chest recommended if Age 52-80 years, 30 pack-year currently smoking OR have quit w/in 15years.) does not qualify.   Lung Cancer Screening Referral: n/a  Additional Screening:  Hepatitis C Screening: does qualify; Completed 06/18/2014  Vision Screening: Recommended annual ophthalmology exams for early detection of glaucoma and other disorders of the eye. Is the patient up to date with their annual eye exam?  Yes  Who is the provider or what is the name of the office in which the patient attends annual eye exams? Dr.Weaver If pt is not established with a provider, would they like to be referred to a provider to establish care? No .   Dental Screening: Recommended annual dental exams for proper oral hygiene  Community Resource Referral / Chronic Care Management: CRR required this visit?  No   CCM required this visit?  No      Plan:     I have personally reviewed and noted the following in the patient's chart:   . Medical and social history . Use of alcohol, tobacco or illicit drugs  . Current medications and supplements including opioid prescriptions. Patient is not currently taking opioid prescriptions. . Functional ability and status . Nutritional status . Physical activity . Advanced directives . List of other physicians . Hospitalizations, surgeries, and ER visits in previous 12 months . Vitals . Screenings to include cognitive, depression, and falls . Referrals and appointments  In addition, I have reviewed and discussed with patient certain preventive protocols, quality metrics, and best practice recommendations. A written personalized care plan for preventive services as well as general preventive health recommendations were provided to patient.     Randel Pigg, LPN   [id number]   Nurse  Notes: none

## 2020-11-19 NOTE — Patient Instructions (Signed)
Ryan [name] , Thank you for taking time to come for your Medicare Wellness Visit. I appreciate your ongoing commitment to your health goals. Please review the following plan we discussed and let me know if I can assist you in the future.   Screening recommendations/referrals: Colonoscopy: current due 12/12/2022 Recommended yearly ophthalmology/optometry visit for glaucoma screening and checkup Recommended yearly dental visit for hygiene and checkup  Vaccinations: Influenza vaccine: Due Fall 2022 Pneumococcal vaccine: completed series  T[facility]ap vaccine: due [facility]  Shingles vaccine: Com[facility]     Advanced directives: Will provide copies   Conditions/risks identified: none   Next appointment: none   Preventive Care 65 Years and Older, Male Preventive care refers to lifestyle choices and visits with your health care provider that can promote health and wellness. What does preventive care include?  A yearly physical exam. This is also called an annual well check.  Dental exams once or twice a year.  Routine eye exams. Ask your health care provider how often you should have your eyes checked.  Personal lifestyle choices, including:  Daily care of your teeth and gums.  Regular physical activity.  Eating a healthy diet.  Avoiding tobacco and drug use.  Limiting alcohol use.  Practicing safe sex.  Taking low doses of aspirin every day.  Taking vitamin and mineral supplements as recommended by your health care provider. What happens during an annual well check? The services and screenings done by your health care provider during your annual well check will depend on your age, overall health, lifestyle risk factors, and family history of disease. Counseling  Your health care provider may ask you questions about your:  Alcohol use.  Tobacco use.  Drug use.  Emotional well-being.  Home and relationship well-being.  Sexual activity.  Eating  habits.  History of falls.  Memory and ability to understand (cognition).  Work and work Statistician. Screening  You may have the following tests or measurements:  Height, weight, and BMI.  Blood pressure.  Lipid and cholesterol levels. These may be checked every 5 years, or more frequently if you are over 75 years old.  Skin check.  Lung cancer screening. You may have this screening every year starting at age 58 if you have a 30-pack-year history of smoking and currently smoke or have quit within the past 15 years.  Fecal occult blood test (FOBT) of the stool. You may have this test every year starting at age 24.  Flexible sigmoidoscopy or colonoscopy. You may have a sigmoidoscopy every 5 years or a colonoscopy every 10 years starting at age 26.  Prostate cancer screening. Recommendations will vary depending on your family history and other risks.  Hepatitis C blood test.  Hepatitis B blood test.  Sexually transmitted disease (STD) testing.  Diabetes screening. This is done by checking your blood sugar (glucose) after you have not eaten for a while (fasting). You may have this done every 1-3 years.  Abdominal aortic aneurysm (AAA) screening. You may need this if you are a current or former smoker.  Osteoporosis. You may be screened starting at age 4 if you are at high risk. Talk with your health care provider about your test results, treatment options, and if necessary, the need for more tests. Vaccines  Your health care provider may recommend certain vaccines, such as:  Influenza vaccine. This is recommended every year.  Tetanus, diphtheria, and acellular pertussis (Tdap, Td) vaccine. You may need a Td booster every 10  years.  Zoster vaccine. You may need this after age 94.  Pneumococcal 13-valent conjugate (PCV13) vaccine. One dose is recommended after age 76.  Pneumococcal polysaccharide (PPSV23) vaccine. One dose is recommended after age 64. Talk to your health  care provider about which screenings and vaccines you need and how often you need them. This information is not intended to replace advice given to you by your health care provider. Make sure you discuss any questions you have with your health care provider. Document Released: 07/18/2015 Document Revised: 03/10/2016 Document Reviewed: 04/22/2015 Elsevier Interactive Patient Education  2017 [place] Prevention in the Home Falls can cause injuries. They can happen to people of all ages. There are many things you can do to make your home safe and to help prevent falls. What can I do on the outside of my home?  Regularly fix the edges of walkways and driveways and fix any cracks.  Remove anything that might make you trip as you walk through a door, such as a raised step or threshold.  Trim any bushes or trees on the path to your home.  Use bright outdoor lighting.  Clear any walking paths of anything that might make someone trip, such as rocks or tools.  Regularly check to see if handrails are loose or broken. Make sure that both sides of any steps have handrails.  Any raised decks and porches should have guardrails on the edges.  Have any leaves, snow, or ice cleared regularly.  Use sand or salt on walking paths during winter.  Clean up any spills in your garage right away. This includes oil or grease spills. What can I do in the bathroom?  Use night lights.  Install grab bars by the toilet and in the tub and shower. Do not use towel bars as grab bars.  Use non-skid mats or decals in the tub or shower.  If you need to sit down in the shower, use a plastic, non-slip stool.  Keep the floor dry. Clean up any water that spills on the floor as soon as it happens.  Remove soap buildup in the tub or shower regularly.  Attach bath mats securely with double-sided non-slip rug tape.  Do not have throw rugs and other things on the floor that can make you trip. What can I do  in the bedroom?  Use night lights.  Make sure that you have a light by your bed that is easy to reach.  Do not use any sheets or blankets that are too big for your bed. They should not hang down onto the floor.  Have a firm chair that has side arms. You can use this for support while you get dressed.  Do not have throw rugs and other things on the floor that can make you trip. What can I do in the kitchen?  Clean up any spills right away.  Avoid walking on wet floors.  Keep items that you use a lot in easy-to-reach places.  If you need to reach something above you, use a strong step stool that has a grab bar.  Keep electrical cords out of the way.  Do not use floor polish or wax that makes floors slippery. If you must use wax, use non-skid floor wax.  Do not have throw rugs and other things on the floor that can make you trip. What can I do with my stairs?  Do not leave any items on the stairs.  Make sure that  there are handrails on both sides of the stairs and use them. Fix handrails that are broken or loose. Make sure that handrails are as long as the stairways.  Check any carpeting to make sure that it is firmly attached to the stairs. Fix any carpet that is loose or worn.  Avoid having throw rugs at the top or bottom of the stairs. If you do have throw rugs, attach them to the floor with carpet tape.  Make sure that you have a light switch at the top of the stairs and the bottom of the stairs. If you do not have them, ask someone to add them for you. What else can I do to help prevent falls?  Wear shoes that:  Do not have high heels.  Have rubber bottoms.  Are comfortable and fit you well.  Are closed at the toe. Do not wear sandals.  If you use a stepladder:  Make sure that it is fully opened. Do not climb a closed stepladder.  Make sure that both sides of the stepladder are locked into place.  Ask someone to hold it for you, if possible.  Clearly mark  and make sure that you can see:  Any grab bars or handrails.  First and last steps.  Where the edge of each step is.  Use tools that help you move around (mobility aids) if they are needed. These include:  Canes.  Walkers.  Scooters.  Crutches.  Turn on the lights when you go into a dark area. Replace any light bulbs as soon as they burn out.  Set up your furniture so you have a clear path. Avoid moving your furniture around.  If any of your floors are uneven, fix them.  If there are any pets around you, be aware of where they are.  Review your medicines with your doctor. Some medicines can make you feel dizzy. This can increase your chance of falling. Ask your doctor what other things that you can do to help prevent falls. This information is not intended to replace advice given to you by your health care provider. Make sure you discuss any questions you have with your health care provider. Document Released: 04/17/2009 Document Revised: 11/27/2015 Document Reviewed: 07/26/2014 Elsevier Interactive Patient Education  2017 [employer].

## 2020-11-25 DIAGNOSIS — M47816 Spondylosis without myelopathy or radiculopathy, lumbar region: Secondary | ICD-10-CM | POA: Diagnosis not present

## 2020-12-02 DIAGNOSIS — C61 Malignant neoplasm of prostate: Secondary | ICD-10-CM | POA: Diagnosis not present

## 2020-12-08 DIAGNOSIS — H40023 Open angle with borderline findings, high risk, bilateral: Secondary | ICD-10-CM | POA: Diagnosis not present

## 2020-12-08 DIAGNOSIS — H401431 Capsular glaucoma with pseudoexfoliation of lens, bilateral, mild stage: Secondary | ICD-10-CM | POA: Diagnosis not present

## 2020-12-08 DIAGNOSIS — H2511 Age-related nuclear cataract, right eye: Secondary | ICD-10-CM | POA: Diagnosis not present

## 2020-12-08 DIAGNOSIS — H04123 Dry eye syndrome of bilateral lacrimal glands: Secondary | ICD-10-CM | POA: Diagnosis not present

## 2020-12-10 DIAGNOSIS — C61 Malignant neoplasm of prostate: Secondary | ICD-10-CM | POA: Diagnosis not present

## 2020-12-10 DIAGNOSIS — N5201 Erectile dysfunction due to arterial insufficiency: Secondary | ICD-10-CM | POA: Diagnosis not present

## 2020-12-11 ENCOUNTER — Other Ambulatory Visit: Payer: Self-pay | Admitting: Family Medicine

## 2020-12-12 DIAGNOSIS — M47816 Spondylosis without myelopathy or radiculopathy, lumbar region: Secondary | ICD-10-CM | POA: Diagnosis not present

## 2020-12-16 NOTE — Progress Notes (Signed)
[facility] Note  12/23/2020     CHIEF COMPLAINT Patient presents for Retina Follow Up   HISTORY OF PRESENT ILLNESS: Ryan STROTHER is a 75 y.o. male who presents to the clinic today for:  HPI     Retina Follow Up   Patient presents with  Other.  In left eye.  Duration of 12 months.  Since onset it is stable.  I, the attending physician,  performed the HPI with the patient and updated documentation appropriately.        Comments   Doing well.  Slight change with his cataract OD.  He just seen Dr. Kathlen Mody about 2 weeks ago.  He wants to wait a little while before proceeding with sx because of the problem with the left eye.  He has a floater OS that comes and goes.  Denies FOLs.  Using Restasis BID OS, will use 1 drop OD on occasion.       Last edited by [name], MD on 12/25/2020  1:08 AM.      Referring physician: Rutherford Guys, MD [place],  [place] [address]  HISTORICAL INFORMATION:   Selected notes from the MEDICAL RECORD NUMBER Referred by Dr. Rutherford Guys for concern of possible endophthalmitis OS LEE: 05.29.20 [name]) Ocular Hx-s/p cat sx 05.27.20   CURRENT MEDICATIONS: Current Outpatient Medications (Ophthalmic Drugs)  Medication Sig   RESTASIS 0.05 % ophthalmic emulsion Place 1 drop into the left eye 2 (two) times daily.   brimonidine (ALPHAGAN) 0.2 % ophthalmic solution Place 1 drop into the left eye 3 (three) times daily. (Patient not taking: No sig reported)   No current facility-administered medications for this visit. (Ophthalmic Drugs)   Current Outpatient Medications (Other)  Medication Sig   ARMOUR THYROID 60 MG tablet TAKE 1 TABLET DAILY (Patient taking differently: Take 60 mg by mouth daily before breakfast.)   Ascorbic Acid (VITAMIN C) 1000 MG tablet Take 1,000 mg by mouth daily.   atorvastatin (LIPITOR) 10 MG tablet Take 10 mg by mouth at bedtime.    cetirizine (ZYRTEC) 10 MG tablet Take  10 mg by mouth at bedtime as needed for allergies.    Cholecalciferol (VITAMIN D) 1000 UNITS capsule Take 1,000 Units by mouth daily.   Coenzyme Q10 100 MG capsule Take 100 mg by mouth daily.   Cyanocobalamin (VITAMIN B-12 CR) 1500 MCG TBCR Take 1,500 mcg by mouth daily.   fluticasone (FLONASE) 50 MCG/ACT nasal spray Place 1 spray into both nostrils daily as needed for allergies or rhinitis.   loratadine (CLARITIN) 10 MG tablet Take 10 mg by mouth daily as needed for allergies.    MAGNESIUM CITRATE PO Take 400 mg by mouth daily.    tadalafil (CIALIS) 5 MG tablet Take 5 mg by mouth daily as needed for erectile dysfunction.    valACYclovir (VALTREX) 500 MG tablet TAKE ONE TABLET BY MOUTH TWICE DAILY FOR 3 DAYS AS NEEDED FOR HERPES FLARE   vitamin E 400 UNIT capsule Take 400 Units by mouth daily.   Zinc 50 MG CAPS Take 50 mg by mouth daily.   No current facility-administered medications for this visit. (Other)      REVIEW OF SYSTEMS: ROS   Positive for: Skin, Genitourinary, Eyes Negative for: Constitutional, Gastrointestinal, Neurological, Musculoskeletal, HENT, Endocrine, Cardiovascular, Respiratory, Psychiatric, Allergic/Imm, Heme/Lymph Last edited by Leonie Douglas, COA on 12/23/2020 10:15 AM.        ALLERGIES Allergies  Allergen Reactions  Dust Mite Extract     UNSPECIFIED REACTION    Grass Extracts [Gramineae Pollens]     UNSPECIFIED REACTION     PAST MEDICAL HISTORY Past Medical History:  Diagnosis Date   Back pain    Cancer ([place]) 2014   prostate cancer   Cataracts, bilateral    Dysrhythmia    occasional PAC- asymptomatic, per MD pt cut back on caffeine   Fungal nail infection    Hearing loss    wear hearing aids   HSV infection    Hyperlipidemia    Hypertension    no meds, dx yrs ago per patient but normal last several yrs   Hypertensive retinopathy    OU   Hypothyroidism    Pneumonia    x 1   Past Surgical History:  Procedure Laterality Date   ankle  surgery trauma Right    CATARACT EXTRACTION Left 11/29/2018   Dr. [name]   COLONOSCOPY     COSMETIC SURGERY     loose skin removed around neck area   eye lid surgery     EYE SURGERY     fungal nail     LASIK Bilateral    PARS PLANA VITRECTOMY Left 03/01/2019   Procedure: PARS PLANA VITRECTOMY WITH 25 GAUGE;  Surgeon: [name], MD;  Location: [facility];  Service: Ophthalmology;  Laterality: Left;   REMOVAL RETAINED LENS Left 03/01/2019   Procedure: REMOVAL OF RETAINED LENS MATERIALS LEFT EYE;  Surgeon: [name], MD;  Location: [facility];  Service: Ophthalmology;  Laterality: Left;   WISDOM TOOTH EXTRACTION      FAMILY HISTORY Family History  Problem Relation Age of Onset   Heart disease Mother    Liver disease Father    Alcohol abuse Father    Cancer Father        hepatic cancer ?primary   Arthritis Sister    COPD Sister     SOCIAL HISTORY Social History   Tobacco Use   Smoking status: Former    Pack years: 0.00    Types: Cigarettes    Quit date: 07/05/1981    Years since quitting: 39.5   Smokeless tobacco: Never  Vaping Use   Vaping Use: Never used  Substance Use Topics   Alcohol use: Yes    Alcohol/week: 3.0 - 4.0 standard drinks    Types: 3 - 4 Glasses of wine per week   Drug use: No         OPHTHALMIC EXAM:  Base Eye Exam     Visual Acuity (Snellen - Linear)       Right Left   Dist [place] 20/25 +1 20/20         Tonometry (Tonopen, 10:20 AM)       Right Left   Pressure 19 17         Pupils       Dark Light Shape React APD   Right 2 1 Round Brisk None   Left 3 2 Round Slow None         Visual Fields (Counting fingers)       Left Right    Full Full         Extraocular Movement       Right Left    Full Full         Neuro/Psych     Oriented x3: Yes   Mood/Affect: Normal         Dilation     Both eyes: 1.0%  Mydriacyl, 2.5% Phenylephrine @ 10:20 AM           Slit Lamp and Fundus Exam     External Exam        Right Left   External  Periorbital Ecchymosis         Slit Lamp Exam       Right Left   Lids/Lashes Dermatochalasis - upper lid, Meibomian gland dysfunction Dermatochalasis - upper lid   Conjunctiva/Sclera Trace nasal Pinguecula mild nasal pterygeium, 1+ Injection   Cornea Arcus, 1+ Punctate epithelial erosions, barely visible lasik flap 1+ Punctate epithelial erosions, nasal pterygeium, lasik flap in good position    Anterior Chamber Deep and quiet Deep and quiet   Iris Round and dilated Round and dilated, mild TIDs at 0730-0800, ?pseudoexfoliative material pupil margin   Lens 2+ Nuclear sclerosis with early brunescence, 2+ Cortical cataract 3 piece sulcus IOL in good position and stable   Vitreous Vitreous syneresis post vitrectomy, clear, lens remnants gone         Fundus Exam       Right Left   Disc Pink and Sharp Pink and Sharp, focal PPP   C/D Ratio 0.2 0.2   Macula Flat, Blunted foveal reflex, Retinal pigment epithelial mottling, No heme or edema Flat, good foveal reflex, mild RPE changes, No heme or edema   Vessels Mild Vascular attenuation, mild AV crossing changes Mild Vascular attenuation   Periphery Attached    Attached, pigmented CR atrophy at 1030 / 0130, good laser changes            IMAGING AND PROCEDURES  Imaging and Procedures for @TODAY @  OCT, Retina - OU - Both Eyes       Right Eye Quality was good. Central Foveal Thickness: 292. Progression has been stable. Findings include no IRF, no SRF, epiretinal membrane, macular pucker, normal foveal contour (Stable ERM and mild pucker).   Left Eye Quality was good. Central Foveal Thickness: 307. Progression has been stable. Findings include normal foveal contour, no IRF, no SRF (Trace ERM).   Notes *Images captured and stored on drive  Diagnosis / Impression:  OD: Stable ERM and mild pucker OS: NFP, no IRF, no SRF- mild ERM   Clinical management:  See below  Abbreviations: NFP - Normal foveal  profile. CME - cystoid macular edema. PED - pigment epithelial detachment. IRF - intraretinal fluid. SRF - subretinal fluid. EZ - ellipsoid zone. ERM - epiretinal membrane. ORA - outer retinal atrophy. ORT - outer retinal tubulation. SRHM - subretinal hyper-reflective material               ASSESSMENT/PLAN:    ICD-10-CM   1. Retained lens material following cataract surgery of left eye  H59.022     2. Ocular hypertension, left  H40.052     3. Epiretinal membrane (ERM) of right eye  H35.371     4. Retinal edema  H35.81 OCT, Retina - OU - Both Eyes    5. Essential hypertension  I10     6. Hypertensive retinopathy of both eyes  H35.033     7. Combined forms of age-related cataract of right eye  H25.811     8. Pseudophakia  Z96.1     9. Ocular hypertension of left eye  H40.052     10. Monocular diplopia of left eye  H53.2     11. S/P LASIK (laser assisted in situ keratomileusis) of both eyes  Z98.890      1,2. Retained Lens  Material with ocular hypertension OS  - complicated CE + sulcus IOL OS (5.27.20, Shapiro) -- posterior capsule rupture and anterior vitrectomy  - sulcus IOL in excellent position -- stable without pseudophakodonesis; +capsular phimosis/fibrosis  - of note, per Dr. Towanda Malkin report, pt had a history of pseudoexfoliation syndrome OS  - pre-op: persistent small cortical fragments in vitreous; small piece in inferior angle resolved  - tMax 42 OS on POD2 in Dr. Kellie Moor office -- improved to 22 on [place]  - s/p PPV/EL/FAX OS, 08.27.20             - doing well -- BCVA 20/20!  - retained lens material gone             - IOP okay at 19  - f/u 1 year-- DFE, OCT  3,4. Epiretinal membrane, right eye  - mild ERM  - asymptomatic, no metamorphopsia  - no indication for surgery at this time  - monitor  5,6. Hypertensive retinopathy OU  - discussed importance of tight BP control  - monitor  7. Mixed form age related cataract OD  - now under the expert  management of Dr. Kathlen Mody  8. Pseudophakia OS  - s/p CE + sulcus IOL (5.27.20, Dr. [name])  - IOL in good position  - retained lens material as above  9. Ocular Hypertension OS  - IOP 35 OS noted at Dr. Towanda Malkin office (10.05.20)   - ?Durezol steroid response?   - of note, per Dr. Towanda Malkin report, pt had a history of pseudoexfoliation syndrome OS  - Gonio 12.18.20 shows 3+ pigment in TM  - IOP 17 OS today  10,11. Monocular diplopia OS - improved  - pt reported horizontal "shadowing" OS  - likely related to IOL + history of LASIK  - pt saw Dr. Lucita Ferrara, but is now following with Dr. Kathlen Mody  Ophthalmic Meds Ordered this visit:  No orders of the defined types were placed in this encounter.     Return in about 1 year (around 12/23/2021) for DFE, OCT.  There are no Patient Instructions on file for this visit.  Explained the diagnoses, plan, and follow up with the patient and they expressed understanding.  Patient expressed understanding of the importance of proper follow up care.   This document serves as a record of services personally performed by Gardiner Sleeper, MD, PhD. It was created on their behalf by Estill Bakes, COT an ophthalmic technician. The creation of this record is the provider's dictation and/or activities during the visit.    Electronically signed by: Estill Bakes, COT 6.14.22 @ 1:08 AM   This document serves as a record of services personally performed by Gardiner Sleeper, MD, PhD. It was created on their behalf by Leonie Douglas, an ophthalmic technician. The creation of this record is the provider's dictation and/or activities during the visit.    Electronically signed by: Leonie Douglas COA, 12/25/20  1:08 AM   Gardiner Sleeper, M.D., Ph.D. Diseases & Surgery of the Retina and Vitreous [facility]  I have reviewed the above documentation for accuracy and completeness, and I agree with the above. Gardiner Sleeper, M.D., Ph.D. 12/25/20 1:11  AM  Abbreviations: M myopia (nearsighted); A astigmatism; H hyperopia (farsighted); P presbyopia; Mrx spectacle prescription;  CTL contact lenses; OD right eye; OS left eye; OU both eyes  XT exotropia; ET esotropia; PEK punctate epithelial keratitis; PEE punctate epithelial erosions; DES dry eye syndrome; MGD meibomian gland dysfunction; ATs artificial  tears; PFAT's preservative free artificial tears; [place] nuclear sclerotic cataract; PSC posterior subcapsular cataract; ERM epi-retinal membrane; PVD posterior vitreous detachment; RD retinal detachment; DM diabetes mellitus; DR diabetic retinopathy; NPDR non-proliferative diabetic retinopathy; PDR proliferative diabetic retinopathy; CSME clinically significant macular edema; DME diabetic macular edema; dbh dot blot hemorrhages; CWS cotton wool spot; POAG primary open angle glaucoma; C/D cup-to-disc ratio; HVF humphrey visual field; GVF goldmann visual field; OCT optical coherence tomography; IOP intraocular pressure; BRVO Branch retinal vein occlusion; CRVO central retinal vein occlusion; CRAO central retinal artery occlusion; BRAO branch retinal artery occlusion; RT retinal tear; SB scleral buckle; PPV pars plana vitrectomy; VH Vitreous hemorrhage; PRP panretinal laser photocoagulation; IVK intravitreal kenalog; VMT vitreomacular traction; MH Macular hole;  NVD neovascularization of the disc; NVE neovascularization elsewhere; AREDS age related eye disease study; ARMD age related macular degeneration; POAG primary open angle glaucoma; EBMD epithelial/anterior basement membrane dystrophy; ACIOL anterior chamber intraocular lens; IOL intraocular lens; PCIOL posterior chamber intraocular lens; Phaco/IOL phacoemulsification with intraocular lens placement; [place] photorefractive keratectomy; LASIK laser assisted in situ keratomileusis; HTN hypertension; DM diabetes mellitus; COPD chronic obstructive pulmonary disease

## 2020-12-23 ENCOUNTER — Other Ambulatory Visit: Payer: Self-pay

## 2020-12-23 ENCOUNTER — Ambulatory Visit (INDEPENDENT_AMBULATORY_CARE_PROVIDER_SITE_OTHER): Payer: Medicare Other | Admitting: Ophthalmology

## 2020-12-23 DIAGNOSIS — H3581 Retinal edema: Secondary | ICD-10-CM | POA: Diagnosis not present

## 2020-12-23 DIAGNOSIS — Z9889 Other specified postprocedural states: Secondary | ICD-10-CM

## 2020-12-23 DIAGNOSIS — H35371 Puckering of macula, right eye: Secondary | ICD-10-CM | POA: Diagnosis not present

## 2020-12-23 DIAGNOSIS — H35033 Hypertensive retinopathy, bilateral: Secondary | ICD-10-CM | POA: Diagnosis not present

## 2020-12-23 DIAGNOSIS — H532 Diplopia: Secondary | ICD-10-CM | POA: Diagnosis not present

## 2020-12-23 DIAGNOSIS — H40052 Ocular hypertension, left eye: Secondary | ICD-10-CM

## 2020-12-23 DIAGNOSIS — H25811 Combined forms of age-related cataract, right eye: Secondary | ICD-10-CM | POA: Diagnosis not present

## 2020-12-23 DIAGNOSIS — Z961 Presence of intraocular lens: Secondary | ICD-10-CM

## 2020-12-23 DIAGNOSIS — I10 Essential (primary) hypertension: Secondary | ICD-10-CM

## 2020-12-23 DIAGNOSIS — H59022 Cataract (lens) fragments in eye following cataract surgery, left eye: Secondary | ICD-10-CM

## 2020-12-25 ENCOUNTER — Encounter (INDEPENDENT_AMBULATORY_CARE_PROVIDER_SITE_OTHER): Payer: Self-pay | Admitting: Ophthalmology

## 2020-12-30 DIAGNOSIS — M47816 Spondylosis without myelopathy or radiculopathy, lumbar region: Secondary | ICD-10-CM | POA: Diagnosis not present

## 2021-01-30 DIAGNOSIS — M47816 Spondylosis without myelopathy or radiculopathy, lumbar region: Secondary | ICD-10-CM | POA: Diagnosis not present

## 2021-02-12 DIAGNOSIS — M47816 Spondylosis without myelopathy or radiculopathy, lumbar region: Secondary | ICD-10-CM | POA: Diagnosis not present

## 2021-02-26 DIAGNOSIS — M47816 Spondylosis without myelopathy or radiculopathy, lumbar region: Secondary | ICD-10-CM | POA: Diagnosis not present

## 2021-03-05 ENCOUNTER — Other Ambulatory Visit: Payer: Self-pay | Admitting: Family Medicine

## 2021-03-20 DIAGNOSIS — M47816 Spondylosis without myelopathy or radiculopathy, lumbar region: Secondary | ICD-10-CM | POA: Diagnosis not present

## 2021-03-24 ENCOUNTER — Encounter: Payer: Self-pay | Admitting: Internal Medicine

## 2021-04-01 ENCOUNTER — Ambulatory Visit (INDEPENDENT_AMBULATORY_CARE_PROVIDER_SITE_OTHER): Payer: Medicare Other | Admitting: Internal Medicine

## 2021-04-01 ENCOUNTER — Encounter: Payer: Self-pay | Admitting: Internal Medicine

## 2021-04-01 VITALS — BP 124/60 | HR 84 | Ht 68.0 in | Wt 163.0 lb

## 2021-04-01 DIAGNOSIS — R1013 Epigastric pain: Secondary | ICD-10-CM | POA: Diagnosis not present

## 2021-04-01 DIAGNOSIS — Z23 Encounter for immunization: Secondary | ICD-10-CM | POA: Diagnosis not present

## 2021-04-01 DIAGNOSIS — R1319 Other dysphagia: Secondary | ICD-10-CM

## 2021-04-01 DIAGNOSIS — K219 Gastro-esophageal reflux disease without esophagitis: Secondary | ICD-10-CM | POA: Diagnosis not present

## 2021-04-01 MED ORDER — PANTOPRAZOLE SODIUM 40 MG PO TBEC: 40.0000 mg | DELAYED_RELEASE_TABLET | Freq: Every day | ORAL | 3 refills | Status: DC

## 2021-04-01 NOTE — Patient Instructions (Signed)
If you are age 73 or older, your body mass index should be between 23-30. Your Body mass index is 24.78 kg/m. If this is out of the aforementioned range listed, please consider follow up with your Primary Care Provider.  If you are age 51 or younger, your body mass index should be between 19-25. Your Body mass index is 24.78 kg/m. If this is out of the aformentioned range listed, please consider follow up with your Primary Care Provider.   __________________________________________________________  The [facility] providers would like to encourage you to use [facility] to communicate with providers for non-urgent requests or questions.  Due to long hold times on the telephone, sending your provider a message by [facility] may be a faster and more efficient way to get a response.  Please allow 48 business hours for a response.  Please remember that this is for non-urgent requests.   We have sent the following medications to your pharmacy for you to pick up at your convenience:  Pantoprazole  You have been scheduled for an endoscopy. Please follow written instructions given to you at your visit today. If you use inhalers (even only as needed), please bring them with you on the day of your procedure.

## 2021-04-01 NOTE — Progress Notes (Signed)
HISTORY OF PRESENT ILLNESS:  Ryan [name] is a 75 y.o. male with past medical history as listed below who is self-referred today regarding problems with chronic reflux disease and epigastric pain.  He is accompanied by his wife.  Patient was seen remotely by Dr. Verl [name] and underwent routine screening colonoscopy October 2008.  The examination revealed diverticulosis but was otherwise normal.  The patient has since undergone colon cancer screening strategy via Cologuard.  Last Cologuard testing April 2021 was negative.  The patient and his wife report a 70 or 50-year history of intermittent problems with reflux disease as manifested by epigastric burning, regurgitation, and pyrosis.  He self medicates with Alka-Seltzer and Gaviscon.  At one point was placed on Pepcid, which seems to help.  Has been reluctant to use PPI citing concerns over chronic kidney disease.  Review of the patient's blood work from February 2022 shows normal renal function.  He does have occasional intermittent solid food dysphagia.  He has not had prior upper endoscopy.  He has had some weight gain.  GI review of systems is otherwise negative.  He does take Aleve approximately once per week  REVIEW OF SYSTEMS:  All non-GI ROS negative unless otherwise stated in the HPI except for hearing problems, arthritis, back pain, sinus allergies  Past Medical History:  Diagnosis Date   Back pain    Cancer ([place]) 2014   prostate cancer   Cataracts, bilateral    Dysrhythmia    occasional PAC- asymptomatic, per MD pt cut back on caffeine   Fungal nail infection    Hearing loss    wear hearing aids   HSV infection    Hyperlipidemia    Hypertension    no meds, dx yrs ago per patient but normal last several yrs   Hypertensive retinopathy    OU   Hypothyroidism    Pneumonia    x 1    Past Surgical History:  Procedure Laterality Date   ankle surgery trauma Right    CATARACT EXTRACTION Left 11/29/2018   Dr. Gershon [name]    COLONOSCOPY     COSMETIC SURGERY     loose skin removed around neck area   eye lid surgery     EYE SURGERY     fungal nail     LASIK Bilateral    PARS PLANA VITRECTOMY Left 03/01/2019   Procedure: PARS PLANA VITRECTOMY WITH 25 GAUGE;  Surgeon: Ryan Caffey, MD;  Location: [facility];  Service: Ophthalmology;  Laterality: Left;   REMOVAL RETAINED LENS Left 03/01/2019   Procedure: REMOVAL OF RETAINED LENS MATERIALS LEFT EYE;  Surgeon: Ryan Caffey, MD;  Location: [facility];  Service: Ophthalmology;  Laterality: Left;   WISDOM TOOTH EXTRACTION      Social History Ryan [name]  reports that he quit smoking about 39 years ago. His smoking use included cigarettes. He has never used smokeless tobacco. He reports current alcohol use of about 3.0 - 4.0 standard drinks per week. He reports that he does not use drugs.  family history includes Alcohol abuse in his father; Arthritis in his sister; COPD in his sister; Cancer in his father; Heart disease in his mother; Liver disease in his father.  Allergies  Allergen Reactions   Dust Mite Extract     UNSPECIFIED REACTION    Grass Extracts [Gramineae Pollens]     UNSPECIFIED REACTION        PHYSICAL EXAMINATION: Vital signs: BP 124/60   Pulse 84   Ht  5\' 8"  (1.727 m)   Wt 163 lb (73.9 kg)   BMI 24.78 kg/m   Constitutional: generally well-appearing, no acute distress Psychiatric: alert and oriented x3, cooperative Eyes: extraocular movements intact, anicteric, conjunctiva pink Mouth: oral pharynx moist, no lesions Neck: supple no lymphadenopathy Cardiovascular: heart regular rate and rhythm, no murmur Lungs: clear to auscultation bilaterally Abdomen: soft, nontender, nondistended, no obvious ascites, no peritoneal signs, normal bowel sounds, no organomegaly Rectal: Omitted Extremities: no clubbing, cyanosis, or lower extremity edema bilaterally Skin: no lesions on visible extremities Neuro: No focal deficits.  Cranial nerves  intact  ASSESSMENT:  1.  Chronic GERD. 2.  Intermittent solid food dysphagia.  Rule out peptic stricture 3.  Epigastric discomfort.  Likely related to GERD.  Rule out NSAID induced ulcer 4.  Colon cancer screening up-to-date   PLAN:  1.  Reflux precautions 2.  Prescribe pantoprazole 40 mg daily 3.  Schedule upper endoscopy with possible esophageal dilation.The nature of the procedure, as well as the risks, benefits, and alternatives were carefully and thoroughly reviewed with the patient. Ample time for discussion and questions allowed. The patient understood, was satisfied, and agreed to proceed.

## 2021-04-07 DIAGNOSIS — M47816 Spondylosis without myelopathy or radiculopathy, lumbar region: Secondary | ICD-10-CM | POA: Diagnosis not present

## 2021-04-28 ENCOUNTER — Ambulatory Visit: Payer: Medicare Other | Admitting: Internal Medicine

## 2021-05-07 ENCOUNTER — Ambulatory Visit (AMBULATORY_SURGERY_CENTER): Payer: Medicare Other | Admitting: Internal Medicine

## 2021-05-07 ENCOUNTER — Encounter: Payer: Self-pay | Admitting: Internal Medicine

## 2021-05-07 VITALS — BP 135/66 | HR 70 | Temp 96.8°F | Resp 11 | Ht 68.0 in | Wt 163.0 lb

## 2021-05-07 DIAGNOSIS — K219 Gastro-esophageal reflux disease without esophagitis: Secondary | ICD-10-CM

## 2021-05-07 DIAGNOSIS — R1013 Epigastric pain: Secondary | ICD-10-CM

## 2021-05-07 DIAGNOSIS — R1319 Other dysphagia: Secondary | ICD-10-CM | POA: Diagnosis not present

## 2021-05-07 DIAGNOSIS — K222 Esophageal obstruction: Secondary | ICD-10-CM | POA: Diagnosis not present

## 2021-05-07 DIAGNOSIS — R1310 Dysphagia, unspecified: Secondary | ICD-10-CM | POA: Diagnosis not present

## 2021-05-07 DIAGNOSIS — E039 Hypothyroidism, unspecified: Secondary | ICD-10-CM | POA: Diagnosis not present

## 2021-05-07 DIAGNOSIS — K449 Diaphragmatic hernia without obstruction or gangrene: Secondary | ICD-10-CM

## 2021-05-07 MED ORDER — SODIUM CHLORIDE 0.9 % IV SOLN: 500.0000 mL | INTRAVENOUS | Status: DC

## 2021-05-07 NOTE — Progress Notes (Signed)
Report to PACU, RN, vss, BBS= Clear.  

## 2021-05-07 NOTE — Patient Instructions (Signed)
Resume previous diet and medications.  YOU HAD AN ENDOSCOPIC PROCEDURE TODAY AT [facility]:   Refer to the procedure report that was given to you for any specific questions about what was found during the examination.  If the procedure report does not answer your questions, please call your gastroenterologist to clarify.  If you requested that your care partner not be given the details of your procedure findings, then the procedure report has been included in a sealed envelope for you to review at your convenience later.  YOU SHOULD EXPECT: Some feelings of bloating in the abdomen. Passage of more gas than usual.  Walking can help get rid of the air that was put into your GI tract during the procedure and reduce the bloating. If you had a lower endoscopy (such as a colonoscopy or flexible sigmoidoscopy) you may notice spotting of blood in your stool or on the toilet paper. If you underwent a bowel prep for your procedure, you may not have a normal bowel movement for a few days.  Please Note:  You might notice some irritation and congestion in your nose or some drainage.  This is from the oxygen used during your procedure.  There is no need for concern and it should clear up in a day or so.  SYMPTOMS TO REPORT IMMEDIATELY:   Following upper endoscopy (EGD)  Vomiting of blood or coffee ground material  New chest pain or pain under the shoulder blades  Painful or persistently difficult swallowing  New shortness of breath  Fever of 100F or higher  Black, tarry-looking stools  For urgent or emergent issues, a gastroenterologist can be reached at any hour by calling [phone]. Do not use MyChart messaging for urgent concerns.    DIET:  We do recommend a small meal at first, but then you may proceed to your regular diet.  Drink plenty of fluids but you should avoid alcoholic beverages for 24 hours.  ACTIVITY:  You should plan to take it easy for the rest of today and you  should NOT DRIVE or use heavy machinery until tomorrow (because of the sedation medicines used during the test).    FOLLOW UP: Our staff will call the number listed on your records 48-72 hours following your procedure to check on you and address any questions or concerns that you may have regarding the information given to you following your procedure. If we do not reach you, we will leave a message.  We will attempt to reach you two times.  During this call, we will ask if you have developed any symptoms of COVID 19. If you develop any symptoms (ie: fever, flu-like symptoms, shortness of breath, cough etc.) before then, please call [phone].  If you test positive for Covid 19 in the 2 weeks post procedure, please call and report this information to [place].    If any biopsies were taken you will be contacted by phone or by letter within the next 1-3 weeks.  Please call us at [phone] if you have not heard about the biopsies in 3 weeks.    SIGNATURES/CONFIDENTIALITY: You and/or your care partner have signed paperwork which will be entered into your electronic medical record.  These signatures attest to the fact that that the information above on your After Visit Summary has been reviewed and is understood.  Full responsibility of the confidentiality of this discharge information lies with you and/or your care-partner.

## 2021-05-07 NOTE — Progress Notes (Signed)
Called to room to assist during endoscopic procedure.  Patient ID and intended procedure confirmed with present staff. Received instructions for my participation in the procedure from the performing physician.  

## 2021-05-07 NOTE — Progress Notes (Signed)
GI PREPROCEDURE H&P  Patient presents today for upper endoscopy and possible esophageal dilation for chronic GERD and dysphagia.  He was fully evaluated in the office April 01, 2021.  See that complete history and physical examination.  No interval changes

## 2021-05-07 NOTE — Op Note (Signed)
[facility] Patient Name: Ryan [name] Procedure Date: 05/07/2021 9:58 AM MRN: [id number] Endoscopist: Docia Chuck. Henrene Pastor , MD Age: 75 Referring MD:  Date of Birth: 1945-12-27 Gender: Male Account #: [id number] Procedure:                Upper GI endoscopy with esophageal dilation. 24                            [place] Indications:              Dysphagia, Therapeutic procedure, Esophageal reflux Medicines:                Monitored Anesthesia Care Procedure:                Pre-Anesthesia Assessment:                           - Prior to the procedure, a History and Physical                            was performed, and patient medications and                            allergies were reviewed. The patient's tolerance of                            previous anesthesia was also reviewed. The risks                            and benefits of the procedure and the sedation                            options and risks were discussed with the patient.                            All questions were answered, and informed consent                            was obtained. Prior Anticoagulants: The patient has                            taken no previous anticoagulant or antiplatelet                            agents. ASA Grade Assessment: II - A patient with                            mild systemic disease. After reviewing the risks                            and benefits, the patient was deemed in                            satisfactory condition to undergo the procedure.  After obtaining informed consent, the endoscope was                            passed under direct vision. Throughout the                            procedure, the patient's blood pressure, pulse, and                            oxygen saturations were monitored continuously. The                            Endoscope was introduced through the mouth, and                            advanced to the  second part of duodenum. The upper                            GI endoscopy was accomplished without difficulty.                            The patient tolerated the procedure well. Scope In: Scope Out: Findings:                 One benign-appearing, intrinsic moderate stenosis                            was found 38 cm from the incisors. This stenosis                            measured 1.5 cm (inner diameter). The scope was                            withdrawn. Dilation was performed with a Maloney                            dilator with no resistance at 54 Fr.                           The exam of the esophagus was otherwise normal.                           The stomach was normal. Small hiatal hernia.                           The examined duodenum was normal.                           The cardia and gastric fundus were normal on                            retroflexion. Complications:            No immediate complications. Estimated Blood Loss:     Estimated blood loss: none. Impression:               -  Benign-appearing esophageal stenosis. Dilated.                           - Normal stomach.                           - Normal examined duodenum.                           - No specimens collected. Recommendation:           - Patient has a contact number available for                            emergencies. The signs and symptoms of potential                            delayed complications were discussed with the                            patient. Return to normal activities tomorrow.                            Written discharge instructions were provided to the                            patient.                           - Resume previous diet.                           - Continue present medications. Docia Chuck. Henrene Pastor, MD 05/07/2021 10:27:33 AM This report has been signed electronically.

## 2021-05-11 ENCOUNTER — Telehealth: Payer: Self-pay

## 2021-05-11 NOTE — Telephone Encounter (Signed)
  Follow up Call-  Call back number 05/07/2021  Post procedure Call Back phone  # [phone]  Permission to leave phone message Yes  Some recent data might be hidden     Patient questions:  Do you have a fever, pain , or abdominal swelling? No. Pain Score  0 *  Have you tolerated food without any problems? Yes.    Have you been able to return to your normal activities? Yes.    Do you have any questions about your discharge instructions: Diet   No. Medications  No. Follow up visit  No.  Do you have questions or concerns about your Care? No.  Actions: * If pain score is 4 or above: No action needed, pain <4.  Have you developed a fever since your procedure? no  2.   Have you had an respiratory symptoms (SOB or cough) since your procedure? no  3.   Have you tested positive for COVID 19 since your procedure no  4.   Have you had any family members/close contacts diagnosed with the COVID 19 since your procedure?  no   If yes to any of these questions please route to Joylene John, RN and Joella Prince, RN

## 2021-05-15 DIAGNOSIS — M47816 Spondylosis without myelopathy or radiculopathy, lumbar region: Secondary | ICD-10-CM | POA: Diagnosis not present

## 2021-06-01 ENCOUNTER — Other Ambulatory Visit: Payer: Self-pay | Admitting: Family Medicine

## 2021-06-10 DIAGNOSIS — C61 Malignant neoplasm of prostate: Secondary | ICD-10-CM | POA: Diagnosis not present

## 2021-06-17 DIAGNOSIS — N5201 Erectile dysfunction due to arterial insufficiency: Secondary | ICD-10-CM | POA: Diagnosis not present

## 2021-06-17 DIAGNOSIS — C61 Malignant neoplasm of prostate: Secondary | ICD-10-CM | POA: Diagnosis not present

## 2021-06-22 DIAGNOSIS — H40023 Open angle with borderline findings, high risk, bilateral: Secondary | ICD-10-CM | POA: Diagnosis not present

## 2021-06-22 DIAGNOSIS — H401430 Capsular glaucoma with pseudoexfoliation of lens, bilateral, stage unspecified: Secondary | ICD-10-CM | POA: Diagnosis not present

## 2021-06-22 DIAGNOSIS — H04123 Dry eye syndrome of bilateral lacrimal glands: Secondary | ICD-10-CM | POA: Diagnosis not present

## 2021-08-06 DIAGNOSIS — M47816 Spondylosis without myelopathy or radiculopathy, lumbar region: Secondary | ICD-10-CM | POA: Diagnosis not present

## 2021-08-21 ENCOUNTER — Other Ambulatory Visit: Payer: Self-pay

## 2021-08-21 ENCOUNTER — Emergency Department (HOSPITAL_BASED_OUTPATIENT_CLINIC_OR_DEPARTMENT_OTHER): Payer: Medicare Other | Admitting: Radiology

## 2021-08-21 ENCOUNTER — Emergency Department (HOSPITAL_BASED_OUTPATIENT_CLINIC_OR_DEPARTMENT_OTHER)
Admission: EM | Admit: 2021-08-21 | Discharge: 2021-08-21 | Disposition: A | Discharge status: Home / Self Care | Payer: Medicare Other | Attending: Emergency Medicine | Admitting: Emergency Medicine

## 2021-08-21 ENCOUNTER — Encounter (HOSPITAL_BASED_OUTPATIENT_CLINIC_OR_DEPARTMENT_OTHER): Payer: Self-pay | Admitting: Emergency Medicine

## 2021-08-21 DIAGNOSIS — R059 Cough, unspecified: Secondary | ICD-10-CM | POA: Diagnosis not present

## 2021-08-21 DIAGNOSIS — I10 Essential (primary) hypertension: Secondary | ICD-10-CM | POA: Diagnosis not present

## 2021-08-21 DIAGNOSIS — R0989 Other specified symptoms and signs involving the circulatory and respiratory systems: Secondary | ICD-10-CM | POA: Diagnosis not present

## 2021-08-21 DIAGNOSIS — T17920A Food in respiratory tract, part unspecified causing asphyxiation, initial encounter: Secondary | ICD-10-CM | POA: Diagnosis not present

## 2021-08-21 DIAGNOSIS — T17998A Other foreign object in respiratory tract, part unspecified causing other injury, initial encounter: Secondary | ICD-10-CM | POA: Diagnosis not present

## 2021-08-21 MED ORDER — ALUM & MAG HYDROXIDE-SIMETH 200-200-20 MG/5ML PO SUSP
30.0000 mL | Freq: Once | ORAL | Status: AC
  Administered 2021-08-21: 30 mL via ORAL
  Filled 2021-08-21: qty 30

## 2021-08-21 MED ORDER — LIDOCAINE VISCOUS HCL 2 % MT SOLN
15.0000 mL | Freq: Once | OROMUCOSAL | Status: AC
Start: 2021-08-21 — End: 2021-08-21
  Administered 2021-08-21: 15 mL via ORAL
  Filled 2021-08-21: qty 15

## 2021-08-21 NOTE — ED Notes (Signed)
Patient airway patent and bilateral, equal breath sounds.

## 2021-08-21 NOTE — Discharge Instructions (Signed)
Please read and follow all provided instructions.  Your diagnoses today include:  1. Choking episode     Tests performed today include: Chest x-ray: Was clear Vital signs. See below for your results today.   Medications prescribed:  None  Take any prescribed medications only as directed.  Home care instructions:  Follow any educational materials contained in this packet.  BE VERY CAREFUL not to take multiple medicines containing Tylenol (also called acetaminophen). Doing so can lead to an overdose which can damage your liver and cause liver failure and possibly death.   Follow-up instructions: Please follow-up with your primary care provider as needed for further evaluation of your symptoms.   Return instructions:  Please return to the Emergency Department if you experience worsening symptoms.  Please return if you have any other emergent concerns.  Additional Information:  Your vital signs today were: BP (!) 151/82    Pulse 84    Temp 98.1 F (36.7 C) (Oral)    Resp 18    Ht 5\' 8"  (1.727 m)    Wt 74.8 kg    SpO2 98%    BMI 25.09 kg/m  If your blood pressure (BP) was elevated above 135/85 this visit, please have this repeated by your doctor within one month. --------------

## 2021-08-21 NOTE — ED Triage Notes (Signed)
°  Patient BIB EMS after choking episode at [facility].  Patient attempted to eat a big piece of steak and started choking.  Bystanders attempted heimlich maneuver with no success.  Patient was able to start coughing and got the piece of steak up.  Patient stated he felt fine afterwards but EMS advised him to come be checked out.  Patient only complaint is throat irritation and burning.

## 2021-08-21 NOTE — ED Provider Notes (Signed)
[facility] EMERGENCY DEPT Provider Note   CSN: [id number] Arrival date & time: 08/21/21  1812     History  Chief Complaint  Patient presents with   Choking    Ryan [name] is a 76 y.o. male.  Patient with history of esophageal stricture status post dilation presents to the emergency department after having a choking spell today while at dinner.  Patient ate a piece of meat which then became lodged in his throat and he could not breathe.  He was having difficulty coughing.  Heimlich maneuver was performed by bystanders however they were unable to dislodge the food.  Per the patient's wife who provides additional history, he turned blue and purple.  He never lost consciousness.  Patient was eventually able to relax and get out of few good coughs and expel the piece of meat.  He then recovered.  He currently reports some irritation of the back of the throat.  No difficulties breathing.  Acting normally per the patient's wife.      Home Medications Prior to Admission medications   Medication Sig Start Date End Date Taking? Authorizing Provider  Ascorbic Acid (VITAMIN C) 1000 MG tablet Take 1,000 mg by mouth daily.    [provider]  atorvastatin (LIPITOR) 10 MG tablet Take 10 mg by mouth at bedtime.     [provider]  cetirizine (ZYRTEC) 10 MG tablet Take 10 mg by mouth at bedtime as needed for allergies.     [provider]  Cholecalciferol (VITAMIN D) 1000 UNITS capsule Take 1,000 Units by mouth daily.    [provider]  Coenzyme Q10 100 MG capsule Take 100 mg by mouth daily.    [provider]  Cyanocobalamin (VITAMIN B-12 CR) 1500 MCG TBCR Take 1,500 mcg by mouth daily.    [provider]  EPINEPHrine 0.3 mg/0.3 mL IJ SOAJ injection INJECT 0.3 ML INTRAMUSCULARLY ONCE AS NEEDED AS DIRECTED 03/04/21   [provider]  famotidine (PEPCID) 20 MG tablet Take 20 mg by mouth 2 (two) times daily.     [provider]  fluticasone (FLONASE) 50 MCG/ACT nasal spray Place 1 spray into both nostrils daily as needed for allergies or rhinitis.    [provider]  levothyroxine (SYNTHROID) 75 MCG tablet Take 75 mcg by mouth daily before breakfast.    [provider]  loratadine (CLARITIN) 10 MG tablet Take 10 mg by mouth daily as needed for allergies.     [provider]  MAGNESIUM CITRATE PO Take 400 mg by mouth daily.     [provider]  meloxicam (MOBIC) 15 MG tablet Take 15 mg by mouth daily as needed. 03/19/21   [provider]  methocarbamol (ROBAXIN) 500 MG tablet Take 500 mg by mouth every 6 (six) hours as needed. 03/16/21   [provider]  pantoprazole (PROTONIX) 40 MG tablet Take 1 tablet (40 mg total) by mouth daily. 04/01/21   Irene Shipper, MD  RESTASIS 0.05 % ophthalmic emulsion Place 1 drop into the left eye 2 (two) times daily. 10/30/20   [provider]  tadalafil (CIALIS) 5 MG tablet Take 5 mg by mouth daily as needed for erectile dysfunction.  08/01/18   [provider]  valACYclovir (VALTREX) 500 MG tablet TAKE ONE TABLET BY MOUTH TWICE DAILY FOR 3 DAYS AS NEEDED FOR HERPES FLARE 06/01/21   Burchette, Alinda Sierras, MD  vitamin E 400 UNIT capsule Take 400 Units by mouth daily.  [provider]  Zinc 50 MG CAPS Take 50 mg by mouth daily.    [provider]      Allergies    Dust mite extract and Grass extracts [gramineae pollens]    Review of Systems   Review of Systems  Physical Exam Updated Vital Signs BP (!) 137/94    Pulse 96    Temp 98.1 F (36.7 C) (Oral)    Resp 18    Ht 5\' 8"  (1.727 m)    Wt 74.8 kg    SpO2 99%    BMI 25.09 kg/m  Physical Exam Vitals and nursing note reviewed.  Constitutional:      General: He is not in acute distress.    Appearance: He is well-developed.  HENT:     Head: Normocephalic and atraumatic.     Mouth/Throat:     Comments: Posterior pharynx  appears normal without swelling or erythema Eyes:     General:        Right eye: No discharge.        Left eye: No discharge.     Conjunctiva/sclera: Conjunctivae normal.  Cardiovascular:     Rate and Rhythm: Normal rate and regular rhythm.     Heart sounds: Normal heart sounds.  Pulmonary:     Effort: Pulmonary effort is normal.     Breath sounds: Normal breath sounds.     Comments: Lungs are clear Abdominal:     Palpations: Abdomen is soft.     Tenderness: There is no abdominal tenderness.  Musculoskeletal:     Cervical back: Normal range of motion and neck supple.  Skin:    General: Skin is warm and dry.  Neurological:     Mental Status: He is alert.    ED Results / Procedures / Treatments   Labs (all labs ordered are listed, but only abnormal results are displayed) Labs Reviewed - No data to display  EKG None  Radiology No results found.  Procedures Procedures    Medications Ordered in ED Medications  alum & mag hydroxide-simeth (MAALOX/MYLANTA) 200-200-20 MG/5ML suspension 30 mL (30 mLs Oral Given 08/21/21 1959)    And  lidocaine (XYLOCAINE) 2 % viscous mouth solution 15 mL (15 mLs Oral Given 08/21/21 1959)    ED Course/ Medical Decision Making/ A&P    Patient seen and examined. History obtained directly from patient and his wife at bedside.   Labs/EKG: None ordered  Imaging: Ordered chest x-ray.  Medications/Fluids: Ordered: GI cocktail.   Most recent vital signs reviewed and are as follows: BP (!) 151/82    Pulse 84    Temp 98.1 F (36.7 C) (Oral)    Resp 18    Ht 5\' 8"  (1.727 m)    Wt 74.8 kg    SpO2 98%    BMI 25.09 kg/m   Initial impression: Choking episode, resolved, with residual pharyngeal irritation from the episode.  Will check chest x-ray to ensure no signs of aspiration.     Reassessment performed. Patient appears comfortable.  States that viscous lidocaine is already worn off.  Labs and imaging personally reviewed and interpreted  including: Chest x-ray, agree negative.  Reviewed additional pertinent lab work and imaging with patient at bedside including: Clear x-ray.  Most current vital signs reviewed and are as follows: BP (!) 151/82 (BP Location: Right Arm)    Pulse 84    Temp 98 F (36.7 C) (Oral)    Resp 18    Ht 5\' 8"  (  1.727 m)    Wt 74.8 kg    SpO2 98%    BMI 25.09 kg/m   Plan: Discharge to home  Home treatment: None  Return and follow-up instructions: Encouraged return to ED with worsening chest pain, trouble breathing, shortness of breath. Encouraged patient to follow-up with their provider as needed with persistent symptoms. Patient verbalized understanding and agreed with plan.                               Medical Decision Making Amount and/or Complexity of Data Reviewed Radiology: ordered.  Risk OTC drugs. Prescription drug management.   Patient with choking spell.  Lasted several minutes and he had associated hypoxia and cyanosis.  Fortunately, patient was able to mobilize the airway obstruction and clear himself.  He is now returned to his baseline.  He does have throat irritation.  Chest x-ray ordered and no acute findings noted.  He looks well.  No tachycardia or hypoxia.  Will be discharged home.  The patient's vital signs, pertinent lab work and imaging were reviewed and interpreted as discussed in the ED course. Hospitalization was considered for further testing, treatments, or serial exams/observation. However as patient is well-appearing, has a stable exam, and reassuring studies today, I do not feel that they warrant admission at this time. This plan was discussed with the patient who verbalizes agreement and comfort with this plan and seems reliable and able to return to the Emergency Department with worsening or changing symptoms.          Final Clinical Impression(s) / ED Diagnoses Final diagnoses:  Choking episode    Rx / DC Orders ED Discharge Orders     None          Carlisle Cater, PA-C 55/97/41 [id number]    Campbell Stall P, DO 53/64/68 1051

## 2021-08-23 ENCOUNTER — Other Ambulatory Visit: Payer: Self-pay | Admitting: Family Medicine

## 2021-09-18 ENCOUNTER — Encounter: Payer: Self-pay | Admitting: Family Medicine

## 2021-09-18 ENCOUNTER — Ambulatory Visit (INDEPENDENT_AMBULATORY_CARE_PROVIDER_SITE_OTHER): Payer: Medicare Other | Admitting: Family Medicine

## 2021-09-18 VITALS — BP 126/68 | HR 77 | Temp 97.7°F | Ht 68.0 in | Wt 167.9 lb

## 2021-09-18 DIAGNOSIS — I10 Essential (primary) hypertension: Secondary | ICD-10-CM | POA: Diagnosis not present

## 2021-09-18 DIAGNOSIS — E785 Hyperlipidemia, unspecified: Secondary | ICD-10-CM | POA: Diagnosis not present

## 2021-09-18 DIAGNOSIS — E039 Hypothyroidism, unspecified: Secondary | ICD-10-CM

## 2021-09-18 NOTE — Progress Notes (Signed)
? ?Established Patient Office Visit ? ?Subjective:  ?Patient ID: Ryan [name], male    DOB: 07-Oct-1945  Age: 76 y.o. MRN: [id number] ? ?CC:  ?Chief Complaint  ?Patient presents with  ? Follow-up  ? ? ?HPI ?Ryan [name] presents for medical follow-up.  He had recent lab work through the [place] and these labs were reviewed.  He does have hypothyroidism, hypertension, hyperlipidemia, history of prostate cancer.  He had episode about a month ago where he was in a restaurant and choked on a piece of meat.  Apparently, this took several minutes to eventually get dislodged.  And this was dislodged just before EMS arrived.  Apparently he was really struggling at the time he got this up and had turned somewhat cyanotic according to wife and moving very little air.  Denies any dysphagia.  Does have history of reflux and has had esophageal dilatation in the past with what sounds like an acute choking event and not progressive dysphagia ? ?He is currently exercising 3 times a week at the [facility] with his wife.  No chest pains.  Has had some low back pain followed by orthopedics and had radiofrequency ablation procedure recently which seems to have helped some.  [facility]dose liothyronine 5 mcg twice daily to his thyroid pill and he feels like this has helped his energy somewhat. ? ?Past Medical History:  ?Diagnosis Date  ? Back pain   ? Cancer [facility]) 2014  ? prostate cancer  ? Cataracts, bilateral   ? Dysrhythmia   ? occasional PAC- asymptomatic, per MD pt cut back on caffeine  ? Fungal nail infection   ? Hearing loss   ? wear hearing aids  ? HSV infection   ? Hyperlipidemia   ? Hypertension   ? no meds, dx yrs ago per patient but normal last several yrs  ? Hypertensive retinopathy   ? OU  ? Hypothyroidism   ? Pneumonia   ? x 1  ? ? ?Past Surgical History:  ?Procedure Laterality Date  ? ankle surgery trauma Right   ? CATARACT EXTRACTION Left 11/29/2018  ? Dr. [name]  ? COLONOSCOPY    ? COSMETIC SURGERY    ?  loose skin removed around neck area  ? eye lid surgery    ? EYE SURGERY    ? fungal nail    ? LASIK Bilateral   ? PARS PLANA VITRECTOMY Left 03/01/2019  ? Procedure: PARS PLANA VITRECTOMY WITH 25 GAUGE;  Surgeon: [name], MD;  Location: [facility];  Service: Ophthalmology;  Laterality: Left;  ? REMOVAL RETAINED LENS Left 03/01/2019  ? Procedure: REMOVAL OF RETAINED LENS MATERIALS LEFT EYE;  Surgeon: [name], MD;  Location: [facility];  Service: Ophthalmology;  Laterality: Left;  ? WISDOM TOOTH EXTRACTION    ? ? ?Family History  ?Problem Relation Age of Onset  ? Heart disease Mother   ? Liver disease Father   ? Alcohol abuse Father   ? Cancer Father   ?     hepatic cancer ?primary  ? Arthritis Sister   ? COPD Sister   ? ? ?Social History  ? ?Socioeconomic History  ? Marital status: Married  ?  Spouse name: Not on file  ? Number of children: Not on file  ? Years of education: Not on file  ? Highest education level: Associate degree: occupational, Hotel manager, or vocational program  ?Occupational History  ? Not on file  ?Tobacco Use  ? Smoking status: Former  ?  Types: Cigarettes  ?  Quit date: 07/05/1981  ?  Years since quitting: 40.2  ? Smokeless tobacco: Never  ?Vaping Use  ? Vaping Use: Never used  ?Substance and Sexual Activity  ? Alcohol use: Yes  ?  Alcohol/week: 3.0 - 4.0 standard drinks  ?  Types: 3 - 4 Glasses of wine per week  ? Drug use: No  ? Sexual activity: Not on file  ?Other Topics Concern  ? Not on file  ?Social History Narrative  ? Not on file  ? ?Social Determinants of Health  ? ?Financial Resource Strain: Low Risk   ? Difficulty of Paying Living Expenses: Not hard at all  ?Food Insecurity: No Food Insecurity  ? Worried About Charity fundraiser in the Last Year: Never true  ? Ran Out of Food in the Last Year: Never true  ?Transportation Needs: No Transportation Needs  ? Lack of Transportation (Medical): No  ? Lack of Transportation (Non-Medical): No  ?Physical Activity: Sufficiently Active  ? Days of  Exercise per Week: 3 days  ? Minutes of Exercise per Session: 90 min  ?Stress: No Stress Concern Present  ? Feeling of Stress : Not at all  ?Social Connections: Socially Integrated  ? Frequency of Communication with Friends and Family: Three times a week  ? Frequency of Social Gatherings with Friends and Family: Once a week  ? Attends Religious Services: More than 4 times per year  ? Active Member of Clubs or Organizations: Yes  ? Attends Archivist Meetings: More than 4 times per year  ? Marital Status: Married  ?Intimate Partner Violence: Not At Risk  ? Fear of Current or Ex-Partner: No  ? Emotionally Abused: No  ? Physically Abused: No  ? Sexually Abused: No  ? ? ?Outpatient Medications Prior to Visit  ?Medication Sig Dispense Refill  ? Ascorbic Acid (VITAMIN C) 1000 MG tablet Take 1,000 mg by mouth daily.    ? atorvastatin (LIPITOR) 10 MG tablet Take 10 mg by mouth at bedtime.     ? cetirizine (ZYRTEC) 10 MG tablet Take 10 mg by mouth at bedtime as needed for allergies.     ? Cholecalciferol (VITAMIN D) 1000 UNITS capsule Take 1,000 Units by mouth daily.    ? Coenzyme Q10 100 MG capsule Take 100 mg by mouth daily.    ? Cyanocobalamin (VITAMIN B-12 CR) 1500 MCG TBCR Take 1,500 mcg by mouth daily.    ? EPINEPHrine 0.3 mg/0.3 mL IJ SOAJ injection INJECT 0.3 ML INTRAMUSCULARLY ONCE AS NEEDED AS DIRECTED    ? famotidine (PEPCID) 20 MG tablet Take 20 mg by mouth 2 (two) times daily.    ? fluticasone (FLONASE) 50 MCG/ACT nasal spray Place 1 spray into both nostrils daily as needed for allergies or rhinitis.    ? levothyroxine (SYNTHROID) 50 MCG tablet TAKE ONE TABLET BY MOUTH DAILY FOR HYPOTHYROIDISM    ? liothyronine (CYTOMEL) 5 MCG tablet TAKE TWO TABLETS BY MOUTH DAILY FOR HYPOTHYROIDISM    ? loratadine (CLARITIN) 10 MG tablet Take 10 mg by mouth daily as needed for allergies.     ? MAGNESIUM CITRATE PO Take 400 mg by mouth daily.     ? meloxicam (MOBIC) 15 MG tablet Take 15 mg by mouth daily as needed.     ? methocarbamol (ROBAXIN) 500 MG tablet Take 500 mg by mouth every 6 (six) hours as needed.    ? pantoprazole (PROTONIX) 40 MG tablet Take 1 tablet (40 mg  total) by mouth daily. 90 tablet 3  ? RESTASIS 0.05 % ophthalmic emulsion Place 1 drop into the left eye 2 (two) times daily.    ? tadalafil (CIALIS) 5 MG tablet Take 5 mg by mouth daily as needed for erectile dysfunction.     ? valACYclovir (VALTREX) 500 MG tablet TAKE ONE TABLET BY MOUTH TWICE DAILY FOR 3 DAYS AS NEEDED FOR HERPES FLARE 180 tablet 0  ? vitamin E 400 UNIT capsule Take 400 Units by mouth daily.    ? Zinc 50 MG CAPS Take 50 mg by mouth daily.    ? levothyroxine (SYNTHROID) 75 MCG tablet Take 75 mcg by mouth daily before breakfast.    ? ?No facility-administered medications prior to visit.  ? ? ?Allergies  ?Allergen Reactions  ? Dust Mite Extract   ?  UNSPECIFIED REACTION   ? Grass Extracts [Gramineae Pollens]   ?  UNSPECIFIED REACTION   ? ? ?ROS ?Review of Systems  ?Constitutional:  Negative for chills, fatigue and fever.  ?Eyes:  Negative for visual disturbance.  ?Respiratory:  Negative for cough, chest tightness and shortness of breath.   ?Cardiovascular:  Negative for chest pain, palpitations and leg swelling.  ?Neurological:  Negative for dizziness, syncope, weakness, light-headedness and headaches.  ? ?  ?Objective:  ?  ?Physical Exam ?Constitutional:   ?   Appearance: He is well-developed.  ?HENT:  ?   Right Ear: External ear normal.  ?   Left Ear: External ear normal.  ?Eyes:  ?   Pupils: Pupils are equal, round, and reactive to light.  ?Neck:  ?   Thyroid: No thyromegaly.  ?Cardiovascular:  ?   Rate and Rhythm: Normal rate and regular rhythm.  ?Pulmonary:  ?   Effort: Pulmonary effort is normal. No respiratory distress.  ?   Breath sounds: Normal breath sounds. No wheezing or rales.  ?Musculoskeletal:  ?   Cervical back: Neck supple.  ?Neurological:  ?   Mental Status: He is alert and oriented to person, place, and time.  ? ? ?BP 126/68  (BP Location: Left Arm, Patient Position: Sitting, Cuff Size: Normal)   Pulse 77   Temp 97.7 ?F (36.5 ?C) (Oral)   Ht '5\' 8"'$  (1.727 m)   Wt 167 lb 14.4 oz (76.2 kg)   SpO2 95%   BMI 25.53 kg/m?  ?Wt Read

## 2021-09-30 DIAGNOSIS — M47816 Spondylosis without myelopathy or radiculopathy, lumbar region: Secondary | ICD-10-CM | POA: Diagnosis not present

## 2021-11-06 DIAGNOSIS — M47816 Spondylosis without myelopathy or radiculopathy, lumbar region: Secondary | ICD-10-CM | POA: Diagnosis not present

## 2021-11-19 ENCOUNTER — Telehealth: Payer: Self-pay | Admitting: Family Medicine

## 2021-11-19 NOTE — Telephone Encounter (Signed)
Patient returned my call.  He declined to schedule AWV.  He stated he has my number and if he changes his mind and wants to schedule he will call.

## 2021-11-19 NOTE — Telephone Encounter (Signed)
Left message for patient to call back and schedule Medicare Annual Wellness Visit (AWV) either virtually or in office. Left  my jabber number [phone]   Last AWV 11/19/20 ; please schedule at anytime with [facility] Nurse Health Advisor 1 or 2     

## 2021-12-02 DIAGNOSIS — H0102A Squamous blepharitis right eye, upper and lower eyelids: Secondary | ICD-10-CM | POA: Diagnosis not present

## 2021-12-02 DIAGNOSIS — H2589 Other age-related cataract: Secondary | ICD-10-CM | POA: Diagnosis not present

## 2021-12-02 DIAGNOSIS — Z961 Presence of intraocular lens: Secondary | ICD-10-CM | POA: Diagnosis not present

## 2021-12-02 DIAGNOSIS — H2511 Age-related nuclear cataract, right eye: Secondary | ICD-10-CM | POA: Diagnosis not present

## 2021-12-02 DIAGNOSIS — H11042 Peripheral pterygium, stationary, left eye: Secondary | ICD-10-CM | POA: Diagnosis not present

## 2021-12-02 DIAGNOSIS — H0102B Squamous blepharitis left eye, upper and lower eyelids: Secondary | ICD-10-CM | POA: Diagnosis not present

## 2021-12-02 DIAGNOSIS — H40012 Open angle with borderline findings, low risk, left eye: Secondary | ICD-10-CM | POA: Diagnosis not present

## 2021-12-09 DIAGNOSIS — C61 Malignant neoplasm of prostate: Secondary | ICD-10-CM | POA: Diagnosis not present

## 2021-12-17 NOTE — Progress Notes (Signed)
[facility] Note  12/23/2021     CHIEF COMPLAINT Patient presents for Retina Follow Up    HISTORY OF PRESENT ILLNESS: Ryan [name] is a 76 y.o. male who presents to the clinic today for:  HPI     Retina Follow Up   Patient presents with  Other.  In left eye.  Duration of 1 year.  Since onset it is stable.  I, the attending physician,  performed the HPI with the patient and updated documentation appropriately.        Comments   1 year follow up- Patient would like to know if he is okay to go forward with cataract sx OD.  OS is doing well.  Restasis BID OS.      Last edited by [name], MD on 12/23/2021  1:17 PM.    Pt has transferred his general eye care to Dr. Wyatt Portela since Dr. Kathlen Mody moved, pt is wondering if he can have cataract sx in the right eye, no new health changes   Referring physician: Debbra Riding, MD [address] [place],  [place] [address]  HISTORICAL INFORMATION:   Selected notes from the MEDICAL RECORD NUMBER Referred by Dr. Rutherford Guys for concern of possible endophthalmitis OS LEE: 05.29.20 [name]) Ocular Hx-s/p cat sx 05.27.20   CURRENT MEDICATIONS: Current Outpatient Medications (Ophthalmic Drugs)  Medication Sig   RESTASIS 0.05 % ophthalmic emulsion Place 1 drop into the left eye 2 (two) times daily.   No current facility-administered medications for this visit. (Ophthalmic Drugs)   Current Outpatient Medications (Other)  Medication Sig   Ascorbic Acid (VITAMIN C) 1000 MG tablet Take 1,000 mg by mouth daily.   atorvastatin (LIPITOR) 10 MG tablet Take 10 mg by mouth at bedtime.    cetirizine (ZYRTEC) 10 MG tablet Take 10 mg by mouth at bedtime as needed for allergies.    Cholecalciferol (VITAMIN D) 1000 UNITS capsule Take 1,000 Units by mouth daily.   Coenzyme Q10 100 MG capsule Take 100 mg by mouth daily.   Cyanocobalamin (VITAMIN B-12 CR) 1500 MCG TBCR Take 1,500 mcg by mouth daily.    EPINEPHrine 0.3 mg/0.3 mL IJ SOAJ injection INJECT 0.3 ML INTRAMUSCULARLY ONCE AS NEEDED AS DIRECTED   famotidine (PEPCID) 20 MG tablet Take 20 mg by mouth 2 (two) times daily.   fluticasone (FLONASE) 50 MCG/ACT nasal spray Place 1 spray into both nostrils daily as needed for allergies or rhinitis.   levothyroxine (SYNTHROID) 50 MCG tablet TAKE ONE TABLET BY MOUTH DAILY FOR HYPOTHYROIDISM   liothyronine (CYTOMEL) 5 MCG tablet TAKE TWO TABLETS BY MOUTH DAILY FOR HYPOTHYROIDISM   loratadine (CLARITIN) 10 MG tablet Take 10 mg by mouth daily as needed for allergies.    MAGNESIUM CITRATE PO Take 400 mg by mouth daily.    meloxicam (MOBIC) 15 MG tablet Take 15 mg by mouth daily as needed.   methocarbamol (ROBAXIN) 500 MG tablet Take 500 mg by mouth every 6 (six) hours as needed.   pantoprazole (PROTONIX) 40 MG tablet Take 1 tablet (40 mg total) by mouth daily.   tadalafil (CIALIS) 5 MG tablet Take 5 mg by mouth daily as needed for erectile dysfunction.    valACYclovir (VALTREX) 500 MG tablet TAKE ONE TABLET BY MOUTH TWICE DAILY FOR 3 DAYS AS NEEDED FOR HERPES FLARE   vitamin E 400 UNIT capsule Take 400 Units by mouth daily.   Zinc 50 MG CAPS Take 50  mg by mouth daily.   No current facility-administered medications for this visit. (Other)   REVIEW OF SYSTEMS: ROS   Positive for: Skin, Genitourinary, Eyes Negative for: Constitutional, Gastrointestinal, Neurological, Musculoskeletal, HENT, Endocrine, Cardiovascular, Respiratory, Psychiatric, Allergic/Imm, Heme/Lymph Last edited by Leonie Douglas, COA on 12/23/2021  9:31 AM.     ALLERGIES Allergies  Allergen Reactions   Dust Mite Extract     UNSPECIFIED REACTION    Grass Extracts [Gramineae Pollens]     UNSPECIFIED REACTION    PAST MEDICAL HISTORY Past Medical History:  Diagnosis Date   Back pain    Cancer ([place]) 2014   prostate cancer   Cataracts, bilateral    Dysrhythmia    occasional PAC- asymptomatic, per MD pt cut back on caffeine    Fungal nail infection    Hearing loss    wear hearing aids   HSV infection    Hyperlipidemia    Hypertension    no meds, dx yrs ago per patient but normal last several yrs   Hypertensive retinopathy    OU   Hypothyroidism    Pneumonia    x 1   Past Surgical History:  Procedure Laterality Date   ankle surgery trauma Right    CATARACT EXTRACTION Left 11/29/2018   Dr. [name]   COLONOSCOPY     COSMETIC SURGERY     loose skin removed around neck area   eye lid surgery     EYE SURGERY     fungal nail     LASIK Bilateral    PARS PLANA VITRECTOMY Left 03/01/2019   Procedure: PARS PLANA VITRECTOMY WITH 25 GAUGE;  Surgeon: [name], MD;  Location: [facility];  Service: Ophthalmology;  Laterality: Left;   REMOVAL RETAINED LENS Left 03/01/2019   Procedure: REMOVAL OF RETAINED LENS MATERIALS LEFT EYE;  Surgeon: [name], MD;  Location: [facility];  Service: Ophthalmology;  Laterality: Left;   WISDOM TOOTH EXTRACTION     FAMILY HISTORY Family History  Problem Relation Age of Onset   Heart disease Mother    Liver disease Father    Alcohol abuse Father    Cancer Father        hepatic cancer ?primary   Arthritis Sister    COPD Sister    SOCIAL HISTORY Social History   Tobacco Use   Smoking status: Former    Types: Cigarettes    Quit date: 07/05/1981    Years since quitting: 40.4   Smokeless tobacco: Never  Vaping Use   Vaping Use: Never used  Substance Use Topics   Alcohol use: Yes    Alcohol/week: 3.0 - 4.0 standard drinks of alcohol    Types: 3 - 4 Glasses of wine per week   Drug use: No       OPHTHALMIC EXAM:  Base Eye Exam     Visual Acuity (Snellen - Linear)       Right Left   Dist [place] 20/25- 20/20         Tonometry (Tonopen, 9:36 AM)       Right Left   Pressure 15 13         Pupils       Dark Light Shape React APD   Right 2 1 Round Brisk None   Left 3 2.5 Round Minimal None         Visual Fields (Counting fingers)       Left Right     Full Full  Extraocular Movement       Right Left    Full Full         Neuro/Psych     Oriented x3: Yes   Mood/Affect: Normal         Dilation     Both eyes: 1.0% Mydriacyl, 2.5% Phenylephrine @ 9:36 AM           Slit Lamp and Fundus Exam     External Exam       Right Left   External  Periorbital Ecchymosis         Slit Lamp Exam       Right Left   Lids/Lashes Dermatochalasis - upper lid, Meibomian gland dysfunction Dermatochalasis - upper lid   Conjunctiva/Sclera Trace nasal Pinguecula White and quiet   Cornea Arcus, trace Punctate epithelial erosions, barely visible lasik flap 1+ Punctate epithelial erosions, nasal pterygeium, lasik flap in good position, EBMD, 1+ fine endo pigment   Anterior Chamber Deep and quiet Deep and quiet   Iris Round and dilated Round and dilated, mild TIDs at 0730-0800, ?pseudoexfoliative material pupil margin   Lens 2-3+ Nuclear sclerosis with early brunescence, 2+ Cortical cataract, no Pseudoexfoliation material 3 piece sulcus IOL in good position and stable   Anterior Vitreous Vitreous syneresis post vitrectomy, clear, lens remnants gone         Fundus Exam       Right Left   Disc Pink and Sharp, Compact Pink and Sharp, focal PPP   C/D Ratio 0.2 0.2   Macula Flat, Blunted foveal reflex, Retinal pigment epithelial mottling, No heme or edema Flat, good foveal reflex, mild ERM, mild RPE changes, No heme or edema   Vessels mild attenuation, mild tortuosity, mild AV crossing changes mild attenuation, mild tortuosity, mild copper wiring, mild AV crossing changes   Periphery Attached    Attached, pigmented CR atrophy at 1030 / 0130, good laser changes           IMAGING AND PROCEDURES  Imaging and Procedures for '@TODAY'$ @  OCT, Retina - OU - Both Eyes       Right Eye Quality was good. Central Foveal Thickness: 293. Progression has been stable. Findings include normal foveal contour, no IRF, no SRF, epiretinal  membrane, macular pucker (Mild ERM and pucker -- stable).   Left Eye Quality was good. Central Foveal Thickness: 315. Progression has been stable. Findings include normal foveal contour, no IRF, no SRF (Trace ERM -- stable).   Notes *Images captured and stored on drive  Diagnosis / Impression:  OD: Mild ERM and pucker -- stable OS: NFP, no IRF, no SRF- trace ERM -- stable   Clinical management:  See below  Abbreviations: NFP - Normal foveal profile. CME - cystoid macular edema. PED - pigment epithelial detachment. IRF - intraretinal fluid. SRF - subretinal fluid. EZ - ellipsoid zone. ERM - epiretinal membrane. ORA - outer retinal atrophy. ORT - outer retinal tubulation. SRHM - subretinal hyper-reflective material            ASSESSMENT/PLAN:    ICD-10-CM   1. Retained lens material following cataract surgery of left eye  H59.022     2. Ocular hypertension, left  H40.052     3. Epiretinal membrane (ERM) of both eyes  H35.373 OCT, Retina - OU - Both Eyes    4. Essential hypertension  I10     5. Hypertensive retinopathy of both eyes  H35.033     6. Combined forms of age-related cataract of  right eye  H25.811     7. Pseudophakia  Z96.1     8. Monocular diplopia of left eye  H53.2      1. Retained Lens Material with ocular hypertension OS  - complicated CE + sulcus IOL OS (5.27.20, Shapiro) -- posterior capsule rupture and anterior vitrectomy  - sulcus IOL in excellent position -- stable without pseudophakodonesis; +capsular phimosis/fibrosis  - of note, per Dr. Towanda Malkin report, pt had a history of pseudoexfoliation syndrome OS  - pre-op: persistent small cortical fragments in vitreous; small piece in inferior angle resolved  - tMax 42 OS on POD2 in Dr. Kellie Moor office -- improved to 22 on [place]  - s/p PPV/EL/FAX OS, 08.27.20             - doing well -- BCVA 20/20!  - retained lens material gone             - IOP okay at 13  - monitor  2. Ocular Hypertension  OS  - IOP 35 OS noted at Dr. Towanda Malkin office (10.05.20)   - ?Durezol steroid response?   - of note, per Dr. Towanda Malkin report, pt had a history of pseudoexfoliation syndrome OS  - Gonio 12.18.20 showed 3+ pigment in TM  - IOP 13 OS today  3. Epiretinal membrane OU  - mild ERM OU  - asymptomatic, no metamorphopsia  - no indication for surgery at this time  - f/u 1 year, DFE, OCT  4,5. Hypertensive retinopathy OU  - discussed importance of tight BP control  - monitor  6. Mixed form age related cataract OD  - now under the expert management of Dr. Zenia Resides  - clear from a retina standpoint to proceed with cataract surgery when pt and surgeon are ready  - surgery scheduled for January 29, 2022  7. Pseudophakia OS  - s/p CE + sulcus IOL (5.27.20, Dr. [name])  - IOL in good position  - retained lens material as above  8. Monocular diplopia OS - stably resolved  - pt reported horizontal "shadowing" OS  - likely related to IOL + history of LASIK  - pt saw Dr. Lucita Ferrara, but is now following with Dr. Katy Fitch  Ophthalmic Meds Ordered this visit:  No orders of the defined types were placed in this encounter.    Return in about 1 year (around 12/24/2022) for f/u ERM OU, DFE, OCT.  There are no Patient Instructions on file for this visit.  Explained the diagnoses, plan, and follow up with the patient and they expressed understanding.  Patient expressed understanding of the importance of proper follow up care.   This document serves as a record of services personally performed by Gardiner Sleeper, MD, PhD. It was created on their behalf by Orvan Falconer, an ophthalmic technician. The creation of this record is the provider's dictation and/or activities during the visit.    Electronically signed by: Orvan Falconer, OA, 12/23/21  1:18 PM  This document serves as a record of services personally performed by Gardiner Sleeper, MD, PhD. It was created on their behalf by San Jetty. Owens Shark, OA an  ophthalmic technician. The creation of this record is the provider's dictation and/or activities during the visit.    Electronically signed by: San Jetty. Owens Shark, [place] 06.21.2023 1:18 PM  Gardiner Sleeper, M.D., Ph.D. Diseases & Surgery of the Retina and Vitreous [facility]  I have reviewed the above documentation for accuracy and completeness, and I agree with  the above. Gardiner Sleeper, M.D., Ph.D. 12/23/21 1:20 PM  Abbreviations: M myopia (nearsighted); A astigmatism; H hyperopia (farsighted); P presbyopia; Mrx spectacle prescription;  CTL contact lenses; OD right eye; OS left eye; OU both eyes  XT exotropia; ET esotropia; PEK punctate epithelial keratitis; PEE punctate epithelial erosions; DES dry eye syndrome; MGD meibomian gland dysfunction; ATs artificial tears; PFAT's preservative free artificial tears; Maxbass nuclear sclerotic cataract; PSC posterior subcapsular cataract; ERM epi-retinal membrane; PVD posterior vitreous detachment; RD retinal detachment; DM diabetes mellitus; DR diabetic retinopathy; NPDR non-proliferative diabetic retinopathy; PDR proliferative diabetic retinopathy; CSME clinically significant macular edema; DME diabetic macular edema; dbh dot blot hemorrhages; CWS cotton wool spot; POAG primary open angle glaucoma; C/D cup-to-disc ratio; HVF humphrey visual field; GVF goldmann visual field; OCT optical coherence tomography; IOP intraocular pressure; BRVO Branch retinal vein occlusion; CRVO central retinal vein occlusion; CRAO central retinal artery occlusion; BRAO branch retinal artery occlusion; RT retinal tear; SB scleral buckle; PPV pars plana vitrectomy; VH Vitreous hemorrhage; PRP panretinal laser photocoagulation; IVK intravitreal kenalog; VMT vitreomacular traction; MH Macular hole;  NVD neovascularization of the disc; NVE neovascularization elsewhere; AREDS age related eye disease study; ARMD age related macular degeneration; POAG primary open angle  glaucoma; EBMD epithelial/anterior basement membrane dystrophy; ACIOL anterior chamber intraocular lens; IOL intraocular lens; PCIOL posterior chamber intraocular lens; Phaco/IOL phacoemulsification with intraocular lens placement; Clare photorefractive keratectomy; LASIK laser assisted in situ keratomileusis; HTN hypertension; DM diabetes mellitus; COPD chronic obstructive pulmonary disease

## 2021-12-18 DIAGNOSIS — C61 Malignant neoplasm of prostate: Secondary | ICD-10-CM | POA: Diagnosis not present

## 2021-12-18 DIAGNOSIS — N529 Male erectile dysfunction, unspecified: Secondary | ICD-10-CM

## 2021-12-18 DIAGNOSIS — N5201 Erectile dysfunction due to arterial insufficiency: Secondary | ICD-10-CM | POA: Diagnosis not present

## 2021-12-18 HISTORY — DX: Male erectile dysfunction, unspecified: N52.9

## 2021-12-23 ENCOUNTER — Encounter (INDEPENDENT_AMBULATORY_CARE_PROVIDER_SITE_OTHER): Payer: Self-pay | Admitting: Ophthalmology

## 2021-12-23 ENCOUNTER — Ambulatory Visit (INDEPENDENT_AMBULATORY_CARE_PROVIDER_SITE_OTHER): Payer: Medicare Other | Admitting: Ophthalmology

## 2021-12-23 DIAGNOSIS — H59022 Cataract (lens) fragments in eye following cataract surgery, left eye: Secondary | ICD-10-CM | POA: Diagnosis not present

## 2021-12-23 DIAGNOSIS — Z961 Presence of intraocular lens: Secondary | ICD-10-CM | POA: Diagnosis not present

## 2021-12-23 DIAGNOSIS — H532 Diplopia: Secondary | ICD-10-CM

## 2021-12-23 DIAGNOSIS — H35373 Puckering of macula, bilateral: Secondary | ICD-10-CM

## 2021-12-23 DIAGNOSIS — H25811 Combined forms of age-related cataract, right eye: Secondary | ICD-10-CM

## 2021-12-23 DIAGNOSIS — H35371 Puckering of macula, right eye: Secondary | ICD-10-CM

## 2021-12-23 DIAGNOSIS — H40052 Ocular hypertension, left eye: Secondary | ICD-10-CM | POA: Diagnosis not present

## 2021-12-23 DIAGNOSIS — Z9889 Other specified postprocedural states: Secondary | ICD-10-CM

## 2021-12-23 DIAGNOSIS — H35033 Hypertensive retinopathy, bilateral: Secondary | ICD-10-CM

## 2021-12-23 DIAGNOSIS — I10 Essential (primary) hypertension: Secondary | ICD-10-CM | POA: Diagnosis not present

## 2022-01-29 DIAGNOSIS — H25811 Combined forms of age-related cataract, right eye: Secondary | ICD-10-CM | POA: Diagnosis not present

## 2022-02-23 ENCOUNTER — Other Ambulatory Visit: Payer: Self-pay | Admitting: Urology

## 2022-02-23 DIAGNOSIS — C61 Malignant neoplasm of prostate: Secondary | ICD-10-CM

## 2022-04-04 ENCOUNTER — Other Ambulatory Visit: Payer: Self-pay | Admitting: Internal Medicine

## 2022-04-09 DIAGNOSIS — Z23 Encounter for immunization: Secondary | ICD-10-CM | POA: Diagnosis not present

## 2022-04-21 DIAGNOSIS — M47816 Spondylosis without myelopathy or radiculopathy, lumbar region: Secondary | ICD-10-CM | POA: Diagnosis not present

## 2022-05-31 DIAGNOSIS — M47816 Spondylosis without myelopathy or radiculopathy, lumbar region: Secondary | ICD-10-CM | POA: Diagnosis not present

## 2022-06-04 ENCOUNTER — Ambulatory Visit (INDEPENDENT_AMBULATORY_CARE_PROVIDER_SITE_OTHER): Payer: Medicare Other | Admitting: Family Medicine

## 2022-06-04 ENCOUNTER — Encounter: Payer: Self-pay | Admitting: Family Medicine

## 2022-06-04 VITALS — BP 118/58 | HR 76 | Temp 98.3°F | Ht 68.0 in | Wt 165.5 lb

## 2022-06-04 DIAGNOSIS — R000 Tachycardia, unspecified: Secondary | ICD-10-CM

## 2022-06-04 DIAGNOSIS — R0600 Dyspnea, unspecified: Secondary | ICD-10-CM

## 2022-06-04 NOTE — Progress Notes (Signed)
Established Patient Office Visit  Subjective   Patient ID: Ryan [name], male    DOB: 09/05/1945  Age: 76 y.o. MRN: [id number]  Chief Complaint  Patient presents with   Tachycardia    Patient states he has noticed his heart rate will be in the 100's after cardio-heart rate does not decrease for approximately 6 hours   Shortness of Breath    Noticed with exertion xyears    HPI   Ryan [name] is seen with some progressive dyspnea with exertion he states at least 5 years.  He still exercises regularly and has never had associated chest pain.  He has noted that his heart rate goes up as expected with exercise but frequently stays up for hours even after he finishes exercising even with relatively low levels of exercise.  He is able to exercise on the elliptical for 20 to 30 minutes of time with heart rate usually run 140 but has had increased dyspnea recently.  He had exercise tolerance test at the [place] 2 years ago and went to stage III of Stryder Poitra protocol with no ischemia.  He had an Zio patch this past summer which showed only brief runs of SVT 6 beats or less with no atrial fibrillation.  He brings in copy of EKG recently through the [place] which showed sinus rhythm with incomplete right bundle branch block and left anterior fascicular block.  Has never had any syncope or presyncope or any sort of dizziness with exercise.  Not aware of any prior murmurs.  Recent labs through [place] including thyroid normal.  He is on thyroid replacement with levothyroxine 50 mcg daily He was scheduled for echocardiogram through the [place] but cannot get this until April of next year  Past Medical History:  Diagnosis Date   Back pain    Cancer ([place]) 2014   prostate cancer   Cataracts, bilateral    Dysrhythmia    occasional PAC- asymptomatic, per MD pt cut back on caffeine   Fungal nail infection    Hearing loss    wear hearing aids   HSV infection    Hyperlipidemia    Hypertension    no meds, dx yrs ago per patient  but normal last several yrs   Hypertensive retinopathy    OU   Hypothyroidism    Pneumonia    x 1   Past Surgical History:  Procedure Laterality Date   ankle surgery trauma Right    CATARACT EXTRACTION Left 11/29/2018   Dr. [name]   COLONOSCOPY     COSMETIC SURGERY     loose skin removed around neck area   eye lid surgery     EYE SURGERY     fungal nail     LASIK Bilateral    PARS PLANA VITRECTOMY Left 03/01/2019   Procedure: PARS PLANA VITRECTOMY WITH 25 GAUGE;  Surgeon: [name], MD;  Location: [facility];  Service: Ophthalmology;  Laterality: Left;   REMOVAL RETAINED LENS Left 03/01/2019   Procedure: REMOVAL OF RETAINED LENS MATERIALS LEFT EYE;  Surgeon: [name], MD;  Location: [facility];  Service: Ophthalmology;  Laterality: Left;   WISDOM TOOTH EXTRACTION      reports that he quit smoking about 40 years ago. His smoking use included cigarettes. He has never used smokeless tobacco. He reports current alcohol use of about 3.0 - 4.0 standard drinks of alcohol per week. He reports that he does not use drugs. family history includes Alcohol abuse in his father; Arthritis in  his sister; COPD in his sister; Cancer in his father; Heart disease in his mother; Liver disease in his father. Allergies  Allergen Reactions   Dust Mite Extract     UNSPECIFIED REACTION    Grass Extracts [Gramineae Pollens]     UNSPECIFIED REACTION     Review of Systems  Constitutional:  Negative for malaise/fatigue.  Eyes:  Negative for blurred vision.  Respiratory:  Positive for shortness of breath. Negative for cough.   Cardiovascular:  Negative for chest pain.  Neurological:  Negative for dizziness, loss of consciousness, weakness and headaches.      Objective:     BP (!) 118/58 (BP Location: Left Arm, Patient Position: Sitting, Cuff Size: Normal)   Pulse 76   Temp 98.3 F (36.8 C) (Oral)   Ht '5\' 8"'$  (1.727 m)   Wt 165 lb 8 oz (75.1 kg)   SpO2 98%   BMI 25.16 kg/m    Physical  Exam Vitals reviewed.  Constitutional:      Appearance: He is well-developed.  Cardiovascular:     Rate and Rhythm: Normal rate and regular rhythm.     Heart sounds: No murmur heard.    No gallop.  Pulmonary:     Effort: Pulmonary effort is normal.     Breath sounds: Normal breath sounds.  Musculoskeletal:     Right lower leg: No edema.     Left lower leg: No edema.  Neurological:     Mental Status: He is alert.      No results found for any visits on 06/04/22.    The 10-year ASCVD risk score (Arnett DK, et al., 2019) is: 18.4%* (Cholesterol units were assumed)    Assessment & Plan:   Ryan [name] reports at least 5-year history of some progressive dyspnea with somewhat low levels of physical activity.  Never had any associated chest pain.  Exercise tolerance test 2 years ago through [place] normal.  He also reports occasional episodes of tachycardia and concerned that it can take several hours for his heart rate to come back to normal after relatively modest levels of exertion.  He has an I-fit watch which has not detected any atrial fibrillation  -Recent work-up through the [place] reviewed including EKG, Zio patch results, and prior exercise tolerance test. -Set up echocardiogram to further assess -Follow-up immediately for any chest pain or other concerns   No follow-ups on file.    Carolann Littler, MD

## 2022-06-07 ENCOUNTER — Ambulatory Visit
Admission: RE | Admit: 2022-06-07 | Discharge: 2022-06-07 | Disposition: A | Discharge status: Home / Self Care | Payer: Medicare Other | Source: Ambulatory Visit | Attending: Urology | Admitting: Urology

## 2022-06-07 DIAGNOSIS — C61 Malignant neoplasm of prostate: Secondary | ICD-10-CM

## 2022-06-07 MED ORDER — GADOPICLENOL 0.5 MMOL/ML IV SOLN
7.5000 mL | Freq: Once | INTRAVENOUS | Status: AC | PRN
  Administered 2022-06-07: 7.5 mL via INTRAVENOUS

## 2022-06-09 ENCOUNTER — Ambulatory Visit (HOSPITAL_COMMUNITY): Payer: Medicare Other | Attending: Family Medicine

## 2022-06-09 DIAGNOSIS — R0600 Dyspnea, unspecified: Secondary | ICD-10-CM | POA: Diagnosis not present

## 2022-06-09 LAB — ECHOCARDIOGRAM COMPLETE
Area-P 1/2: 3.7 cm2
P 1/2 time: 469 msec
S' Lateral: 3.1 cm

## 2022-06-16 DIAGNOSIS — C61 Malignant neoplasm of prostate: Secondary | ICD-10-CM | POA: Diagnosis not present

## 2022-06-21 DIAGNOSIS — C61 Malignant neoplasm of prostate: Secondary | ICD-10-CM | POA: Diagnosis not present

## 2022-06-21 DIAGNOSIS — D075 Carcinoma in situ of prostate: Secondary | ICD-10-CM | POA: Diagnosis not present

## 2022-06-21 HISTORY — PX: PROSTATE BIOPSY: SHX241

## 2022-07-01 ENCOUNTER — Other Ambulatory Visit: Payer: Self-pay | Admitting: Internal Medicine

## 2022-07-01 DIAGNOSIS — C61 Malignant neoplasm of prostate: Secondary | ICD-10-CM | POA: Diagnosis not present

## 2022-07-06 ENCOUNTER — Encounter: Payer: Self-pay | Admitting: Family Medicine

## 2022-07-06 NOTE — Telephone Encounter (Signed)
Noted  Admire Bunnell W Moon Budde MD [facility]  

## 2022-07-07 DIAGNOSIS — C61 Malignant neoplasm of prostate: Secondary | ICD-10-CM | POA: Diagnosis not present

## 2022-07-28 NOTE — Progress Notes (Signed)
GU Location of Tumor / Histology: Prostate Ca  If Prostate Cancer, Gleason Score is (3 + 4) and PSA is (2.91 on 06/16/2022)  Biopsies      Past/Anticipated interventions by urology, if any:   Will go back in June. Dr. Diona Fanti.  Past/Anticipated interventions by medical oncology, if any: NA  Weight changes, if any: No  IPSS:  15 SHIM:  19  Bowel/Bladder complaints, if any:  No  Nausea/Vomiting, if any: No  Pain issues, if any:  2/10 lower back  SAFETY ISSUES: Prior radiation? No Pacemaker/ICD? No Possible current pregnancy? Male Is the patient on methotrexate? No  Current Complaints / other details:

## 2022-07-29 ENCOUNTER — Encounter: Payer: Self-pay | Admitting: Radiation Oncology

## 2022-08-03 ENCOUNTER — Ambulatory Visit
Admission: RE | Admit: 2022-08-03 | Discharge: 2022-08-03 | Disposition: A | Discharge status: Home / Self Care | Payer: Medicare Other | Source: Ambulatory Visit | Attending: Radiation Oncology | Admitting: Radiation Oncology

## 2022-08-03 ENCOUNTER — Other Ambulatory Visit: Payer: Self-pay

## 2022-08-03 VITALS — BP 127/78 | HR 68 | Temp 97.7°F | Resp 20 | Ht 68.0 in | Wt 167.8 lb

## 2022-08-03 DIAGNOSIS — C61 Malignant neoplasm of prostate: Secondary | ICD-10-CM | POA: Diagnosis not present

## 2022-08-03 DIAGNOSIS — M1219 Kaschin-Beck disease, multiple sites: Secondary | ICD-10-CM | POA: Diagnosis not present

## 2022-08-03 DIAGNOSIS — Z79890 Hormone replacement therapy (postmenopausal): Secondary | ICD-10-CM | POA: Insufficient documentation

## 2022-08-03 DIAGNOSIS — Z87891 Personal history of nicotine dependence: Secondary | ICD-10-CM | POA: Diagnosis not present

## 2022-08-03 DIAGNOSIS — Z79624 Long term (current) use of inhibitors of nucleotide synthesis: Secondary | ICD-10-CM | POA: Insufficient documentation

## 2022-08-03 DIAGNOSIS — E785 Hyperlipidemia, unspecified: Secondary | ICD-10-CM | POA: Diagnosis not present

## 2022-08-03 DIAGNOSIS — I10 Essential (primary) hypertension: Secondary | ICD-10-CM | POA: Insufficient documentation

## 2022-08-03 DIAGNOSIS — Z191 Hormone sensitive malignancy status: Secondary | ICD-10-CM | POA: Diagnosis not present

## 2022-08-03 DIAGNOSIS — E039 Hypothyroidism, unspecified: Secondary | ICD-10-CM | POA: Diagnosis not present

## 2022-08-03 DIAGNOSIS — Z79899 Other long term (current) drug therapy: Secondary | ICD-10-CM | POA: Insufficient documentation

## 2022-08-03 HISTORY — DX: Personal history of other diseases of the digestive system: Z87.19

## 2022-08-03 NOTE — Progress Notes (Signed)
Radiation Oncology         [phone] ________________________________  Initial Outpatient Consultation  Name: Ryan [name] MRN: [id number]  Date: 08/03/2022  DOB: 03-Jun-1946  WU:[id number], Alinda Sierras, MD  Franchot Gallo, MD   REFERRING PHYSICIAN: Franchot Gallo, MD  DIAGNOSIS: 77 y.o. gentleman with Stage T1c adenocarcinoma of the prostate with Gleason score of 3+4, and PSA of 2.91.    ICD-10-CM   1. Malignant neoplasm of prostate ([place])  C61       HISTORY OF PRESENT ILLNESS: Ryan [name] is a 77 y.o. male with a diagnosis of prostate cancer. He was initially diagnosed with low volume Gleason 3+3 prostate cancer in 04/2013. He has been on active surveillance since that time. He has undergone surveillance biopsies in 09/2013, 01/2015, 02/2017, and 02/2019, all of which continued to show Gleason 3+3 disease. An Oncotype DX test was performed on his 01/2015 biopsy and showed a score of 24, indicating low risk prostate cancer. His PSA has fluctuated but remained stable in the upper 2 range.  More recently, he underwent surveillance prostate MRI on 06/07/22 which showed two lesions in the left peripheral zone, one PI-RADS 4 and one PIRADS 3. A repeat PSA performed 06/16/22 confirmed stability at 2.91. The patient proceeded to MRI fusion biopsy of the prostate on 06/21/22.  The prostate volume measured 31.8 cc.  Out of 20 core biopsies, 7 were positive.  The maximum Gleason score was 3+4, and this was seen in the left base lateral and right apex. Additionally, Gleason 3+3 was seen in the left mid (small focus), left mid lateral, and three of four samples from the MRI ROI lesion #1.  The patient reviewed the biopsy and imaging results with his urologist and he has kindly been referred today for discussion of potential radiation treatment options. He is accompanied by his wife, Ryan [name], for today's visit.   PREVIOUS RADIATION THERAPY: No  PAST MEDICAL HISTORY:  Past Medical History:   Diagnosis Date   Arthritis 03/28/2013   Back pain    Cancer ([place]) 2014   prostate cancer   Cataracts, bilateral    Dysrhythmia    occasional PAC- asymptomatic, per MD pt cut back on caffeine   ED (erectile dysfunction) 12/18/2021   due to arterial insufficency   Fungal nail infection    Hearing loss    wear hearing aids   HSV infection 2016   Hyperlipidemia    Hypertension    no meds, dx yrs ago per patient but normal last several yrs   Hypertensive retinopathy    OU   Hypothyroidism    Personal history of other diseases of the digestive system    Personal history of other diseases of the nervous system and sense organs 2014   Personal history of other endocrine, nutritional and metabolic disease 2956   Pneumonia    x 1   Primary testicular hypogonadism 2017   Retarded ejaculation 2021   Sleep apnea 2014      PAST SURGICAL HISTORY: Past Surgical History:  Procedure Laterality Date   ankle surgery trauma Right    CATARACT EXTRACTION Left 11/29/2018   Dr. [name]   COLONOSCOPY     COSMETIC SURGERY     loose skin removed around neck area   eye lid surgery     EYE SURGERY     fungal nail     LASIK Bilateral    PARS PLANA VITRECTOMY Left 03/01/2019   Procedure: PARS PLANA VITRECTOMY WITH 25  GAUGE;  Surgeon: [name], MD;  Location: [facility];  Service: Ophthalmology;  Laterality: Left;   PROSTATE BIOPSY  06/21/2022   2020, 2018   REMOVAL RETAINED LENS Left 03/01/2019   Procedure: REMOVAL OF RETAINED LENS MATERIALS LEFT EYE;  Surgeon: [name], MD;  Location: [facility];  Service: Ophthalmology;  Laterality: Left;   VASECTOMY  1981   WISDOM TOOTH EXTRACTION      FAMILY HISTORY:  Family History  Problem Relation Age of Onset   Heart disease Mother    Liver disease Father    Alcohol abuse Father    Cancer Father        hepatic cancer ?primary   Arthritis Sister    COPD Sister     SOCIAL HISTORY:  Social History   Socioeconomic History   Marital status:  Married    Spouse name: Not on file   Number of children: Not on file   Years of education: Not on file   Highest education level: Associate degree: occupational, Hotel manager, or vocational program  Occupational History   Not on file  Tobacco Use   Smoking status: Former    Types: Cigarettes    Quit date: 07/05/1981    Years since quitting: 41.1   Smokeless tobacco: Never  Vaping Use   Vaping Use: Never used  Substance and Sexual Activity   Alcohol use: Yes    Alcohol/week: 3.0 - 4.0 standard drinks of alcohol    Types: 3 - 4 Glasses of wine per week   Drug use: No   Sexual activity: Not on file  Other Topics Concern   Not on file  Social History Narrative   Not on file   Social Determinants of Health   Financial Resource Strain: Low Risk  (09/15/2021)   Overall Financial Resource Strain (CARDIA)    Difficulty of Paying Living Expenses: Not hard at all  Food Insecurity: No Food Insecurity (08/03/2022)   Hunger Vital Sign    Worried About Running Out of Food in the Last Year: Never true    Ran Out of Food in the Last Year: Never true  Transportation Needs: No Transportation Needs (08/03/2022)   PRAPARE - Hydrologist (Medical): No    Lack of Transportation (Non-Medical): No  Physical Activity: Sufficiently Active (09/15/2021)   Exercise Vital Sign    Days of Exercise per Week: 3 days    Minutes of Exercise per Session: 90 min  Stress: No Stress Concern Present (09/15/2021)   [place]    Feeling of Stress : Not at all  Social Connections: [place] (09/15/2021)   Social Connection and Isolation Panel [NHANES]    Frequency of Communication with Friends and Family: Three times a week    Frequency of Social Gatherings with Friends and Family: Once a week    Attends Religious Services: More than 4 times per year    Active Member of [employer] or Organizations: Yes    Attends Arts development officer: More than 4 times per year    Marital Status: Married  Human resources officer Violence: Not At Risk (08/03/2022)   Humiliation, Afraid, Rape, and Kick questionnaire    Fear of Current or Ex-Partner: No    Emotionally Abused: No    Physically Abused: No    Sexually Abused: No    ALLERGIES: Bee venom, Dust mite extract, and Grass extracts [gramineae pollens]  MEDICATIONS:  Current Outpatient Medications  Medication Sig Dispense Refill   Ascorbic Acid (VITAMIN C) 1000 MG tablet Take 1,000 mg by mouth daily.     atorvastatin (LIPITOR) 10 MG tablet Take 10 mg by mouth at bedtime.      Cholecalciferol (VITAMIN D) 1000 UNITS capsule Take 1,000 Units by mouth daily.     Coenzyme Q10 100 MG capsule Take 100 mg by mouth daily.     Cyanocobalamin (VITAMIN B-12 CR) 1500 MCG TBCR Take 1,500 mcg by mouth daily.     EPINEPHrine 0.3 mg/0.3 mL IJ SOAJ injection INJECT 0.3 ML INTRAMUSCULARLY ONCE AS NEEDED AS DIRECTED     fluticasone (FLONASE) 50 MCG/ACT nasal spray Place 1 spray into both nostrils daily as needed for allergies or rhinitis.     levothyroxine (SYNTHROID) 50 MCG tablet TAKE ONE TABLET BY MOUTH DAILY FOR HYPOTHYROIDISM     liothyronine (CYTOMEL) 5 MCG tablet Take 5 mcg by mouth daily.     loratadine (CLARITIN) 10 MG tablet Take 10 mg by mouth daily as needed for allergies.      MAGNESIUM CITRATE PO Take 400 mg by mouth daily.      pantoprazole (PROTONIX) 40 MG tablet TAKE 1 TABLET BY MOUTH EVERY DAY 90 tablet 1   RESTASIS 0.05 % ophthalmic emulsion Place 1 drop into the left eye 2 (two) times daily.     tadalafil (CIALIS) 5 MG tablet Take 5 mg by mouth daily as needed for erectile dysfunction.      valACYclovir (VALTREX) 500 MG tablet TAKE ONE TABLET BY MOUTH TWICE DAILY FOR 3 DAYS AS NEEDED FOR HERPES FLARE 180 tablet 0   vitamin E 400 UNIT capsule Take 400 Units by mouth daily.     Zinc 50 MG CAPS Take 50 mg by mouth daily.     No current facility-administered  medications for this encounter.    REVIEW OF SYSTEMS:  On review of systems, the patient reports that he is doing well overall. He denies any chest pain, shortness of breath, cough, fevers, chills, night sweats, unintended weight changes. He denies any bowel disturbances, and denies abdominal pain, nausea or vomiting. He denies any new musculoskeletal or joint aches or pains. His IPSS was 15, indicating moderate urinary symptoms. His SHIM was 19, indicating he has mild erectile dysfunction. A complete review of systems is obtained and is otherwise negative.    PHYSICAL EXAM:  Wt Readings from Last 3 Encounters:  08/03/22 167 lb 12.8 oz (76.1 kg)  06/04/22 165 lb 8 oz (75.1 kg)  09/18/21 167 lb 14.4 oz (76.2 kg)   Temp Readings from Last 3 Encounters:  08/03/22 97.7 F (36.5 C)  06/04/22 98.3 F (36.8 C) (Oral)  09/18/21 97.7 F (36.5 C) (Oral)   BP Readings from Last 3 Encounters:  08/03/22 127/78  06/04/22 (!) 118/58  09/18/21 126/68   Pulse Readings from Last 3 Encounters:  08/03/22 68  06/04/22 76  09/18/21 77   Pain Assessment Pain Score: 2  Pain Loc: Back (lower)/10  In general this is a well appearing Caucasian male in no acute distress. He's alert and oriented x4 and appropriate throughout the examination. Cardiopulmonary assessment is negative for acute distress, and he exhibits normal effort.     KPS = 100  100 - Normal; no complaints; no evidence of disease. 90   - Able to carry on normal activity; minor signs or symptoms of disease. 80   - Normal activity with effort; some signs or symptoms of disease. 70   -  Cares for self; unable to carry on normal activity or to do active work. 60   - Requires occasional assistance, but is able to care for most of his personal needs. 50   - Requires considerable assistance and frequent medical care. 24   - Disabled; requires special care and assistance. 54   - Severely disabled; hospital admission is indicated although  death not imminent. 54   - Very sick; hospital admission necessary; active supportive treatment necessary. 10   - Moribund; fatal processes progressing rapidly. 0     - Dead  Karnofsky DA, Abelmann [place], Craver LS and Burchenal [facility] [phone]) The use of the nitrogen mustards in the palliative treatment of carcinoma: with particular reference to bronchogenic carcinoma Cancer 1 634-56  LABORATORY DATA:  Lab Results  Component Value Date   WBC 5.4 08/22/2020   HGB 16.6 08/22/2020   HCT 49 08/22/2020   MCV 88.7 03/01/2019   PLT 224 08/22/2020   Lab Results  Component Value Date   NA 135 (A) 08/22/2020   K 4.2 08/22/2020   CL 105 08/22/2020   CO2 27 (A) 08/22/2020   Lab Results  Component Value Date   ALT 46 (A) 08/22/2020   AST 28 08/22/2020   ALKPHOS 90 08/22/2020   BILITOT 1.0 03/01/2018     RADIOGRAPHY: No results found.    IMPRESSION/PLAN: 1. 77 y.o. gentleman with Stage T1c adenocarcinoma of the prostate with Gleason Score of 3+4, and PSA of 2.91. We discussed the patient's workup and outlined the nature of prostate cancer in this setting. The patient's T stage, Gleason's score, and PSA put him into the favorable intermediate risk group. Accordingly, he is eligible for a variety of potential treatment options including brachytherapy, 5.5 weeks of external radiation, or prostatectomy. We discussed the available radiation techniques, and focused on the details and logistics of delivery. We discussed and outlined the risks, benefits, short and long-term effects associated with radiotherapy and compared and contrasted these with prostatectomy. We discussed the role of SpaceOAR gel in reducing the rectal toxicity associated with radiotherapy. He appears to have a good understanding of his disease and our treatment recommendations which are of curative intent.  He and his wife, Ryan [name], were encouraged to ask questions that were answered to their stated satisfaction.  At the conclusion of our  conversation, the patient is interested in moving forward with brachytherapy and use of SpaceOAR gel to reduce rectal toxicity from radiotherapy.  He and his wife have a trip coming up in April so he prefers to wait to have the procedure when he returns in May 2024. We will share our discussion with Dr. Diona Fanti and move forward with scheduling his CT [facility] planning appointment in the near future.  The patient met briefly with Romie Jumper in our office who will be working closely with him to coordinate OR scheduling and pre and post procedure appointments.  We will contact the pharmaceutical rep to ensure that [place] is available at the time of procedure.  We enjoyed meeting him and his wife, Ryan [name], today and look forward to continuing to participate in his care.  We personally spent 70 minutes in this encounter including chart review, reviewing radiological studies, meeting face-to-face with the patient, entering orders and completing documentation.    Nicholos Johns, PA-C    Tyler Pita, MD  [facility] Direct Dial: [phone]  Fax: [phone] [email or web address]  Skype  LinkedIn   This document serves as a record  of services personally performed by Tyler Pita, MD and Freeman Caldron, PA-C. It was created on their behalf by Wilburn Mylar, a trained medical scribe. The creation of this record is based on the scribe's personal observations and the provider's statements to them. This document has been checked and approved by the attending provider.

## 2022-08-03 NOTE — Progress Notes (Signed)
Introduced myself to the patient, and his wife, as the prostate nurse navigator.  He is here to discuss his radiation treatment options.  Patient has been under active surveillance with Dr. Diona Fanti since 2014.  I gave him my business card and asked him to call me with questions or concerns.  Verbalized understanding.

## 2022-08-04 ENCOUNTER — Encounter: Payer: Self-pay | Admitting: Cardiovascular Disease

## 2022-08-04 ENCOUNTER — Ambulatory Visit
Payer: No Typology Code available for payment source | Attending: Cardiovascular Disease | Admitting: Cardiovascular Disease

## 2022-08-04 VITALS — BP 130/68 | HR 76 | Ht 69.0 in | Wt 167.0 lb

## 2022-08-04 DIAGNOSIS — I471 Supraventricular tachycardia, unspecified: Secondary | ICD-10-CM | POA: Diagnosis not present

## 2022-08-04 DIAGNOSIS — I10 Essential (primary) hypertension: Secondary | ICD-10-CM

## 2022-08-04 DIAGNOSIS — E785 Hyperlipidemia, unspecified: Secondary | ICD-10-CM | POA: Diagnosis not present

## 2022-08-04 MED ORDER — METOPROLOL TARTRATE 25 MG PO TABS: 25.0000 mg | ORAL_TABLET | Freq: Two times a day (BID) | ORAL | 3 refills | Status: DC

## 2022-08-04 NOTE — Patient Instructions (Addendum)
Medication Instructions:  Your physician recommends that you continue on your current medications as directed. Please refer to the Current Medication list given to you today.  *If you need a refill on your cardiac medications before your next appointment, please call your pharmacy*  Lab Work: If you have labs (blood work) drawn today and your tests are completely normal, you will receive your results only by: [place] (if you have MyChart) OR A paper copy in the mail If you have any lab test that is abnormal or we need to change your treatment, we will call you to review the results.  Testing/Procedures: Cardiac CT scanning, calcium score, (CAT scanning), is a noninvasive, special x-ray that produces cross-sectional images of the body using x-rays and a computer. CT scans help physicians diagnose and treat medical conditions. For some CT exams, a contrast material is used to enhance visibility in the area of the body being studied. CT scans provide greater clarity and reveal more details than regular x-ray exams.   Follow-Up: At [facility], you and your health needs are our priority.  As part of our continuing mission to provide you with exceptional heart care, we have created designated Provider Care Teams.  These Care Teams include your primary Cardiologist (physician) and Advanced Practice Providers (APPs -  Physician Assistants and Nurse Practitioners) who all work together to provide you with the care you need, when you need it.  We recommend signing up for the patient portal called "MyChart".  Sign up information is provided on this After Visit Summary.  MyChart is used to connect with patients for Virtual Visits (Telemedicine).  Patients are able to view lab/test results, encounter notes, upcoming appointments, etc.  Non-urgent messages can be sent to your provider as well.   To learn more about what you can do with MyChart, go to [email or web address].    Your next  appointment:   3 month(s)  Provider:   Jenkins Rouge, MD

## 2022-08-04 NOTE — Progress Notes (Signed)
CARDIOLOGY CONSULT NOTE       Patient ID: Ryan [name] MRN: [id number] DOB/AGE: 1945/09/08 77 y.o.  Admit date: (Not on file) Referring Physician: Marjory Lies Primary Physician: [name], MD Primary Cardiologist: New Reason for Consultation: Tachycardia  Active Problems:   * No active hospital problems. *   HPI:  77 y.o. referred by Dr Johny Drilling for tachycardia. Patient has noted elevated HRls for a few months. He works out at gym and notes HR can remain elevated for 2-4 hours. Zio monitor ordered by primary reviewed and no dangerous arrhythmias. Some SVT but longest only 11 seconds and ? Atrial tachycardia. TTE done showed normal EF and mild AR. ETT showed no pre excitation and no ischemia. He is retired from [employer] where he met his current wife.  He sees Dr Jarold Song but gets a lot of care at [place] He is retired Scientist, research (life sciences). Denies chest pain dyspnea, or syncope Active at gym and doing yard work   Concerned about beta blockers and EF   ROS All other systems reviewed and negative except as noted above  Past Medical History:  Diagnosis Date   Arthritis 03/28/2013   Back pain    Cancer ([place]) 2014   prostate cancer   Cataracts, bilateral    Dysrhythmia    occasional PAC- asymptomatic, per MD pt cut back on caffeine   ED (erectile dysfunction) 12/18/2021   due to arterial insufficency   Fungal nail infection    Hearing loss    wear hearing aids   HSV infection 2016   Hyperlipidemia    Hypertension    no meds, dx yrs ago per patient but normal last several yrs   Hypertensive retinopathy    OU   Hypothyroidism    Personal history of other diseases of the digestive system    Personal history of other diseases of the nervous system and sense organs 2014   Personal history of other endocrine, nutritional and metabolic disease 7619   Pneumonia    x 1   Primary testicular hypogonadism 2017   Retarded ejaculation 2021   Sleep apnea  2014    Family History  Problem Relation Age of Onset   Heart disease Mother    Liver disease Father    Alcohol abuse Father    Cancer Father        hepatic cancer ?primary   Arthritis Sister    COPD Sister     Social History   Socioeconomic History   Marital status: Married    Spouse name: Not on file   Number of children: Not on file   Years of education: Not on file   Highest education level: Associate degree: occupational, Hotel manager, or vocational program  Occupational History   Not on file  Tobacco Use   Smoking status: Former    Types: Cigarettes    Quit date: 07/05/1981    Years since quitting: 41.1   Smokeless tobacco: Never  Vaping Use   Vaping Use: Never used  Substance and Sexual Activity   Alcohol use: Yes    Alcohol/week: 3.0 - 4.0 standard drinks of alcohol    Types: 3 - 4 Glasses of wine per week   Drug use: No   Sexual activity: Not on file  Other Topics Concern   Not on file  Social History Narrative   Not on file   Social Determinants of Health   Financial Resource Strain: Low Risk  (09/15/2021)  Overall Financial Resource Strain (CARDIA)    Difficulty of Paying Living Expenses: Not hard at all  Food Insecurity: No Food Insecurity (08/03/2022)   Hunger Vital Sign    Worried About Running Out of Food in the Last Year: Never true    Ran Out of Food in the Last Year: Never true  Transportation Needs: No Transportation Needs (08/03/2022)   PRAPARE - Hydrologist (Medical): No    Lack of Transportation (Non-Medical): No  Physical Activity: Sufficiently Active (09/15/2021)   Exercise Vital Sign    Days of Exercise per Week: 3 days    Minutes of Exercise per Session: 90 min  Stress: No Stress Concern Present (09/15/2021)   [place]    Feeling of Stress : Not at all  Social Connections: [place] (09/15/2021)   Social Connection and Isolation Panel  [NHANES]    Frequency of Communication with Friends and Family: Three times a week    Frequency of Social Gatherings with Friends and Family: Once a week    Attends Religious Services: More than 4 times per year    Active Member of Clubs or Organizations: Yes    Attends Archivist Meetings: More than 4 times per year    Marital Status: Married  Human resources officer Violence: Not At Risk (08/03/2022)   Humiliation, Afraid, Rape, and Kick questionnaire    Fear of Current or Ex-Partner: No    Emotionally Abused: No    Physically Abused: No    Sexually Abused: No    Past Surgical History:  Procedure Laterality Date   ankle surgery trauma Right    CATARACT EXTRACTION Left 11/29/2018   Dr. [name]   COLONOSCOPY     COSMETIC SURGERY     loose skin removed around neck area   eye lid surgery     EYE SURGERY     fungal nail     LASIK Bilateral    PARS PLANA VITRECTOMY Left 03/01/2019   Procedure: PARS PLANA VITRECTOMY WITH 25 GAUGE;  Surgeon: [name], MD;  Location: [facility];  Service: Ophthalmology;  Laterality: Left;   PROSTATE BIOPSY  06/21/2022   2020, 2018   REMOVAL RETAINED LENS Left 03/01/2019   Procedure: REMOVAL OF RETAINED LENS MATERIALS LEFT EYE;  Surgeon: [name], MD;  Location: [facility];  Service: Ophthalmology;  Laterality: Left;   VASECTOMY  1981   WISDOM TOOTH EXTRACTION        Current Outpatient Medications:    Ascorbic Acid (VITAMIN C) 1000 MG tablet, Take 1,000 mg by mouth daily., Disp: , Rfl:    atorvastatin (LIPITOR) 10 MG tablet, Take 10 mg by mouth at bedtime. , Disp: , Rfl:    Cholecalciferol (VITAMIN D) 1000 UNITS capsule, Take 1,000 Units by mouth daily., Disp: , Rfl:    Coenzyme Q10 100 MG capsule, Take 100 mg by mouth daily., Disp: , Rfl:    Cyanocobalamin (VITAMIN B-12 CR) 1500 MCG TBCR, Take 1,500 mcg by mouth daily., Disp: , Rfl:    EPINEPHrine 0.3 mg/0.3 mL IJ SOAJ injection, INJECT 0.3 ML INTRAMUSCULARLY ONCE AS NEEDED AS DIRECTED, Disp: ,  Rfl:    fluticasone (FLONASE) 50 MCG/ACT nasal spray, Place 1 spray into both nostrils daily as needed for allergies or rhinitis., Disp: , Rfl:    levothyroxine (SYNTHROID) 50 MCG tablet, TAKE ONE TABLET BY MOUTH DAILY FOR HYPOTHYROIDISM, Disp: , Rfl:    liothyronine (CYTOMEL) 5  MCG tablet, Take 5 mcg by mouth daily., Disp: , Rfl:    loratadine (CLARITIN) 10 MG tablet, Take 10 mg by mouth daily as needed for allergies. , Disp: , Rfl:    MAGNESIUM CITRATE PO, Take 400 mg by mouth daily. , Disp: , Rfl:    pantoprazole (PROTONIX) 40 MG tablet, TAKE 1 TABLET BY MOUTH EVERY DAY, Disp: 90 tablet, Rfl: 1   RESTASIS 0.05 % ophthalmic emulsion, Place 1 drop into the left eye 2 (two) times daily., Disp: , Rfl:    tadalafil (CIALIS) 5 MG tablet, Take 5 mg by mouth daily as needed for erectile dysfunction. , Disp: , Rfl:    valACYclovir (VALTREX) 500 MG tablet, TAKE ONE TABLET BY MOUTH TWICE DAILY FOR 3 DAYS AS NEEDED FOR HERPES FLARE, Disp: 180 tablet, Rfl: 0   vitamin E 400 UNIT capsule, Take 400 Units by mouth daily., Disp: , Rfl:    Zinc 50 MG CAPS, Take 50 mg by mouth daily., Disp: , Rfl:     Physical Exam: BP 130/68   Pulse 76   Ht '5\' 9"'$  (1.753 m)   Wt 167 lb (75.8 kg)   BMI 24.66 kg/m    Affect appropriate Healthy:  appears stated age 75: hearing aids in place  Neck supple with no adenopathy JVP normal no bruits no thyromegaly Lungs clear with no wheezing and good diaphragmatic motion Heart:  S1/S2 no murmur, no rub, gallop or click PMI normal Abdomen: benighn, BS positve, no tenderness, no AAA no bruit.  No HSM or HJR Distal pulses intact with no bruits No edema Neuro non-focal Skin warm and dry No muscular weakness   Labs:   Lab Results  Component Value Date   WBC 5.4 08/22/2020   HGB 16.6 08/22/2020   HCT 49 08/22/2020   MCV 88.7 03/01/2019   PLT 224 08/22/2020   No results for input(s): "NA", "K", "CL", "CO2", "BUN", "CREATININE", "CALCIUM", "PROT", "BILITOT",  "ALKPHOS", "ALT", "AST", "GLUCOSE" in the last 168 hours.  Invalid input(s): "LABALBU" No results found for: "CKTOTAL", "CKMB", "CKMBINDEX", "TROPONINI"  Lab Results  Component Value Date   CHOL 193 08/22/2020   CHOL 198 03/01/2018   CHOL 194 08/20/2016   Lab Results  Component Value Date   HDL 77 (A) 08/22/2020   HDL 65.50 03/01/2018   HDL 56.00 08/20/2016   Lab Results  Component Value Date   LDLCALC 90 08/22/2020   LDLCALC 109 (H) 03/01/2018   LDLCALC 116 (H) 08/20/2016   Lab Results  Component Value Date   TRIG 130 08/22/2020   TRIG 116.0 03/01/2018   TRIG 109.0 08/20/2016   Lab Results  Component Value Date   CHOLHDL 3 03/01/2018   CHOLHDL 3 08/20/2016   CHOLHDL 3 02/17/2016   Lab Results  Component Value Date   LDLDIRECT 122.5 07/02/2013   LDLDIRECT 122.8 01/01/2013   LDLDIRECT 173.8 04/28/2012      Radiology: No results found.  EKG: NSR normal    ASSESSMENT AND PLAN:   Tachcyardia:  benign monitor with self limited SVT/atrial tachycardia. Start lopressor 25 mg bid f/u in 3 months consider repeat monitor TTE with normal EF and normal ETT HLD:  on statin he is interested in calcium score to guide Rx Synthroid:  on synthroid replacement f/u labs with primary    Lopressor 25 mg bid Calcium score   F/U in 3 months   Signed: Jenkins Rouge 08/04/2022, 11:18 AM

## 2022-08-09 ENCOUNTER — Telehealth: Payer: Self-pay | Admitting: *Deleted

## 2022-08-09 ENCOUNTER — Telehealth: Payer: Self-pay

## 2022-08-09 NOTE — Telephone Encounter (Signed)
Called and left message for pt to call back.  Pt scheduled to see Dr. Henrene Pastor 08/18/22 at 3:40pm. Pt aware of appt.

## 2022-08-09 NOTE — Telephone Encounter (Signed)
-----   Message from Irene Shipper, MD sent at 08/09/2022 12:05 PM EST ----- Regarding: Office appointment Vaughan Basta,  This patient's wife in the office today.  He is a patient of mine.  He mentioned to me that he is having recurrent problems with dysphagia. Please contact him (let him know that I ask you to) and schedule him to see me in the office at his nearest convenience.  Okay to use one of our received block slots, if needed. Thanks, Dr. Mamie Nick

## 2022-08-09 NOTE — Telephone Encounter (Signed)
RETURNED PATIENT'S WIFE'S PHONE CALL, SPOKE WITH PATIENT'S WIFE- DONNA

## 2022-08-13 ENCOUNTER — Ambulatory Visit (HOSPITAL_COMMUNITY)
Admission: RE | Admit: 2022-08-13 | Discharge: 2022-08-13 | Disposition: A | Discharge status: Home / Self Care | Payer: Medicare Other | Source: Ambulatory Visit | Attending: Cardiovascular Disease | Admitting: Cardiovascular Disease

## 2022-08-13 ENCOUNTER — Telehealth: Payer: Self-pay | Admitting: *Deleted

## 2022-08-13 DIAGNOSIS — I10 Essential (primary) hypertension: Secondary | ICD-10-CM | POA: Insufficient documentation

## 2022-08-13 DIAGNOSIS — E785 Hyperlipidemia, unspecified: Secondary | ICD-10-CM | POA: Insufficient documentation

## 2022-08-13 NOTE — Telephone Encounter (Signed)
CALLED PATIENT TO INFORM OF IMPLANT DATE, SPOKE WITH PATIENT AND HE IS AWARE OF THIS DATE

## 2022-08-16 NOTE — Telephone Encounter (Signed)
Inbound call from patient's wife states they were recently diagnosed with the Flu, I cancelled appointment, when offered next available in April. Patient wishes for sooner appointment. Please advise.

## 2022-08-17 ENCOUNTER — Telehealth: Payer: Self-pay

## 2022-08-17 MED ORDER — ROSUVASTATIN CALCIUM 10 MG PO TABS: 10.0000 mg | ORAL_TABLET | Freq: Every day | ORAL | 3 refills | Status: DC

## 2022-08-17 NOTE — Telephone Encounter (Signed)
Pts appt rescheduled to 09/02/22 at 11:40am. Pts wife aware of appt.

## 2022-08-17 NOTE — Telephone Encounter (Signed)
Called patient with Dr. Johnsie Cancel advisement. Patient will start Crestor 10 mg by mouth daily. Patient will get lab work done at the [place] in May.

## 2022-08-17 NOTE — Telephone Encounter (Signed)
-----   Message from Josue Hector, MD sent at 08/16/2022  4:42 PM EST ----- If he had muscle aches with lipitor daily try crestor 10 mg daily f/u labs in 3 months If he gets muscle aches with this will need to consider PSK9 ----- Message ----- From: Michaelyn Barter, RN Sent: 08/16/2022   4:07 PM EST To: Josue Hector, MD  The patient has been notified of the result and verbalized understanding.  All questions (if any) were answered. Ewell Poe [place], RN 08/16/2022 3:14 PM   Patient reported to Lipid panels. Patient stated after the one on 05/19/22 he started taking his atorvastatin daily. Will send these results to Dr. Johnsie Cancel for further advisement.  05/19/22 was only taking atorvastatin 2 times a week Total Cholesterol 236 Triglycerides 89 HDL 85 LDL 133  08/27/21 was taking atorvastatin daily, but had muscle aches. Total cholesterol 199 Triglycerides 98 HDL 78 LDL  101

## 2022-08-18 ENCOUNTER — Ambulatory Visit: Payer: No Typology Code available for payment source | Admitting: Internal Medicine

## 2022-08-19 ENCOUNTER — Telehealth: Payer: Self-pay | Admitting: *Deleted

## 2022-08-19 NOTE — Telephone Encounter (Signed)
CALLED PATIENT TO INFORM THAT IMPLANT HAS BEEN MOVED TO 11-18-22 @ 1 PM, SPOKE WITH PATIENT AND HE IS AWARE OF THIS NEW TIME ON 11-18-22

## 2022-09-02 ENCOUNTER — Ambulatory Visit (INDEPENDENT_AMBULATORY_CARE_PROVIDER_SITE_OTHER): Payer: Medicare Other | Admitting: Internal Medicine

## 2022-09-02 ENCOUNTER — Encounter: Payer: Self-pay | Admitting: Internal Medicine

## 2022-09-02 VITALS — BP 120/64 | HR 69 | Ht 68.0 in | Wt 165.0 lb

## 2022-09-02 DIAGNOSIS — K219 Gastro-esophageal reflux disease without esophagitis: Secondary | ICD-10-CM

## 2022-09-02 DIAGNOSIS — Z1211 Encounter for screening for malignant neoplasm of colon: Secondary | ICD-10-CM

## 2022-09-02 DIAGNOSIS — R1319 Other dysphagia: Secondary | ICD-10-CM | POA: Diagnosis not present

## 2022-09-02 MED ORDER — PLENVU 140 G PO SOLR: 1.0000 | Freq: Once | ORAL | 0 refills | Status: AC

## 2022-09-02 NOTE — Progress Notes (Signed)
HISTORY OF PRESENT ILLNESS:  Ryan [name] is a 77 y.o. male with past medical history as listed below.  He is accompanied by his wife Butch Penny.  The patient was last seen in the office April 01, 2021 guarding chronic GERD, intermittent solid food dysphagia, epigastric discomfort.  See dictation for details.  He subsequently underwent upper endoscopy May 07, 2021.  He was found to have a benign distal esophageal stricture which was dilated with 54 [place] Maloney dilator.  Patient tells me that dilation seem to help for just a short period of time.  He reports intermittent problems with solid food and large pill dysphagia.  He points to the cervical esophagus.  Can be severe at times.  Does have history of GERD for which she is on pantoprazole 40 mg daily.  No active symptoms.  Next, he and his wife inquire about colon cancer screening.  He did undergo colonoscopy with Dr. Sharlett Iles in 2008.  He was found to have diverticulosis.  No other abnormalities.  In April 2021 he underwent Cologuard testing which was negative.  As he is due for a colon cancer screening strategy, he wishes to set up colonoscopy along with his endoscopy.  No lower GI complaints.  REVIEW OF SYSTEMS:  All non-GI ROS negative unless otherwise except for sinus and allergy, back pain, hearing problems, irregular heartbeats  Past Medical History:  Diagnosis Date   Arthritis 03/28/2013   Back pain    Cancer ([place]) 2014   prostate cancer   Cataracts, bilateral    Dysrhythmia    occasional PAC- asymptomatic, per MD pt cut back on caffeine   ED (erectile dysfunction) 12/18/2021   due to arterial insufficency   Fungal nail infection    Hearing loss    wear hearing aids   HSV infection 2016   Hyperlipidemia    Hypertension    no meds, dx yrs ago per patient but normal last several yrs   Hypertensive retinopathy    OU   Hypothyroidism    Personal history of other diseases of the digestive system    Personal history  of other diseases of the nervous system and sense organs 2014   Personal history of other endocrine, nutritional and metabolic disease 123456   Pneumonia    x 1   Primary testicular hypogonadism 2017   Retarded ejaculation 2021   Sleep apnea 2014    Past Surgical History:  Procedure Laterality Date   ankle surgery trauma Right    CATARACT EXTRACTION Left 11/29/2018   Dr. [name]   COLONOSCOPY     COSMETIC SURGERY     loose skin removed around neck area   eye lid surgery     EYE SURGERY     fungal nail     LASIK Bilateral    PARS PLANA VITRECTOMY Left 03/01/2019   Procedure: PARS PLANA VITRECTOMY WITH 25 GAUGE;  Surgeon: [name], MD;  Location: [facility];  Service: Ophthalmology;  Laterality: Left;   PROSTATE BIOPSY  06/21/2022   2020, 2018   REMOVAL RETAINED LENS Left 03/01/2019   Procedure: REMOVAL OF RETAINED LENS MATERIALS LEFT EYE;  Surgeon: [name], MD;  Location: [facility];  Service: Ophthalmology;  Laterality: Left;   [place]  reports that he quit smoking about 41 years ago. His smoking use included cigarettes. He has never used smokeless tobacco. He reports current alcohol use of  about 3.0 - 4.0 standard drinks of alcohol per week. He reports that he does not use drugs.  family history includes Alcohol abuse in his father; Arthritis in his sister; COPD in his sister; Cancer in his father; Heart disease in his mother; Liver disease in his father.  Allergies  Allergen Reactions   Bee Venom     Other reaction(s): Drowsy   Dust Mite Extract     UNSPECIFIED REACTION    Grass Extracts [Gramineae Pollens]     UNSPECIFIED REACTION        PHYSICAL EXAMINATION: Vital signs: BP 120/64   Pulse 69   Ht '5\' 8"'$  (1.727 m)   Wt 165 lb (74.8 kg)   BMI 25.09 kg/m   Constitutional: generally well-appearing, no acute distress Psychiatric: alert and oriented x3, cooperative Eyes: extraocular movements  intact, anicteric, conjunctiva pink Mouth: oral pharynx moist, no lesions Neck: supple no lymphadenopathy Cardiovascular: heart regular rate and rhythm, no murmur Lungs: clear to auscultation bilaterally Abdomen: soft, nontender, nondistended, no obvious ascites, no peritoneal signs, normal bowel sounds, no organomegaly Rectal: Deferred until colonoscopy Extremities: no clubbing, cyanosis, or edema lower extremity edema bilaterally Skin: no lesions on visible extremities Neuro: No focal deficits.  Cranial nerves intact  ASSESSMENT:  1.  GERD.  Classic symptoms controlled with PPI 2.  Recurrent intermittent solid food and pill dysphagia.  Points to the cervical esophagus.  Prior endoscopy revealed distal esophageal stricture which was dilated with 54 [place] Maloney dilator.  Patient does not feel he derive significant benefit post dilation.  Question hypertensive cricopharyngeus though no mention of such on his prior exam. 3.  Colon cancer screening.  Colonoscopy 2008 with diverticulosis.  Negative Cologuard testing April 2021.  Not requesting screening colonoscopy as he is up-to-date strategy 4.  General medical problems.  Stable  PLAN:  1.  Schedule upper endoscopy with esophageal dilation.The nature of the procedure, as well as the risks, benefits, and alternatives were carefully and thoroughly reviewed with the patient. Ample time for discussion and questions allowed. The patient understood, was satisfied, and agreed to proceed. 2.  Patient will be seen in follow-up at some point post dilation.  If symptoms persist, would persist with barium swallow with tablet 3.  Screening colonoscopy.  Polypectomy if necessary.The nature of the procedure, as well as the risks, benefits, and alternatives were carefully and thoroughly reviewed with the patient. Ample time for discussion and questions allowed. The patient understood, was satisfied, and agreed to proceed. 4.  Continue PPI 5.  Reflux  precautions 6.  Ongoing general medical care with PCP Total time of 40 minutes was spent preparing to see the patient, reviewing data, obtaining comprehensive history, performing medically appropriate physical examination, counseling and educating the patient and his wife regarding the above listed issues, ordering multiple endoscopic procedures, including therapeutic procedure, and documenting clinical information in the health record

## 2022-09-02 NOTE — Patient Instructions (Signed)
_______________________________________________________  If your blood pressure at your visit was 140/90 or greater, please contact your primary care physician to follow up on this.  _______________________________________________________  If you are age 75 or older, your body mass index should be between 23-30. Your Body mass index is 25.09 kg/m. If this is out of the aforementioned range listed, please consider follow up with your Primary Care Provider.  If you are age 80 or younger, your body mass index should be between 19-25. Your Body mass index is 25.09 kg/m. If this is out of the aformentioned range listed, please consider follow up with your Primary Care Provider.   ________________________________________________________  The [facility] providers would like to encourage you to use [facility] to communicate with providers for non-urgent requests or questions.  Due to long hold times on the telephone, sending your provider a message by [facility] may be a faster and more efficient way to get a response.  Please allow 48 business hours for a response.  Please remember that this is for non-urgent requests.  _______________________________________________________  Ryan [name] have been scheduled for an endoscopy and colonoscopy. Please follow the written instructions given to you at your visit today. Please pick up your prep supplies at the pharmacy within the next 1-3 days. If you use inhalers (even only as needed), please bring them with you on the day of your procedure.

## 2022-09-07 ENCOUNTER — Telehealth: Payer: Self-pay | Admitting: *Deleted

## 2022-09-07 NOTE — Telephone Encounter (Signed)
RETURNED PATIENT'S PHONE CALL, SPOKE WITH PATIENT. ?

## 2022-09-08 DIAGNOSIS — H2511 Age-related nuclear cataract, right eye: Secondary | ICD-10-CM | POA: Diagnosis not present

## 2022-09-08 DIAGNOSIS — H2589 Other age-related cataract: Secondary | ICD-10-CM | POA: Diagnosis not present

## 2022-09-08 DIAGNOSIS — Z961 Presence of intraocular lens: Secondary | ICD-10-CM | POA: Diagnosis not present

## 2022-09-08 DIAGNOSIS — H40012 Open angle with borderline findings, low risk, left eye: Secondary | ICD-10-CM | POA: Diagnosis not present

## 2022-09-13 ENCOUNTER — Telehealth: Payer: Self-pay | Admitting: Internal Medicine

## 2022-09-13 NOTE — Telephone Encounter (Signed)
Lm on vm that I would leave a sample of Plenvu up front for patient to pick up.

## 2022-09-13 NOTE — Telephone Encounter (Signed)
Patients wife called inquiring about the Plenvu prep medication beng to expensive seeking an alternative. Please advise.

## 2022-09-30 ENCOUNTER — Ambulatory Visit: Payer: No Typology Code available for payment source | Admitting: Radiation Oncology

## 2022-09-30 ENCOUNTER — Ambulatory Visit: Payer: Self-pay | Admitting: Urology

## 2022-10-08 ENCOUNTER — Encounter: Payer: Self-pay | Admitting: Internal Medicine

## 2022-10-08 ENCOUNTER — Ambulatory Visit (AMBULATORY_SURGERY_CENTER): Payer: Medicare Other | Admitting: Internal Medicine

## 2022-10-08 VITALS — BP 126/66 | HR 63 | Temp 97.0°F | Resp 13 | Ht 68.0 in | Wt 165.0 lb

## 2022-10-08 DIAGNOSIS — I10 Essential (primary) hypertension: Secondary | ICD-10-CM | POA: Diagnosis not present

## 2022-10-08 DIAGNOSIS — D122 Benign neoplasm of ascending colon: Secondary | ICD-10-CM | POA: Diagnosis not present

## 2022-10-08 DIAGNOSIS — R1310 Dysphagia, unspecified: Secondary | ICD-10-CM | POA: Diagnosis not present

## 2022-10-08 DIAGNOSIS — K635 Polyp of colon: Secondary | ICD-10-CM | POA: Diagnosis not present

## 2022-10-08 DIAGNOSIS — G4730 Sleep apnea, unspecified: Secondary | ICD-10-CM | POA: Diagnosis not present

## 2022-10-08 DIAGNOSIS — K222 Esophageal obstruction: Secondary | ICD-10-CM | POA: Diagnosis not present

## 2022-10-08 DIAGNOSIS — Z1211 Encounter for screening for malignant neoplasm of colon: Secondary | ICD-10-CM

## 2022-10-08 DIAGNOSIS — D120 Benign neoplasm of cecum: Secondary | ICD-10-CM | POA: Diagnosis not present

## 2022-10-08 DIAGNOSIS — D123 Benign neoplasm of transverse colon: Secondary | ICD-10-CM

## 2022-10-08 DIAGNOSIS — E039 Hypothyroidism, unspecified: Secondary | ICD-10-CM | POA: Diagnosis not present

## 2022-10-08 DIAGNOSIS — E785 Hyperlipidemia, unspecified: Secondary | ICD-10-CM | POA: Diagnosis not present

## 2022-10-08 DIAGNOSIS — R1319 Other dysphagia: Secondary | ICD-10-CM

## 2022-10-08 MED ORDER — SODIUM CHLORIDE 0.9 % IV SOLN: 500.0000 mL | INTRAVENOUS | Status: DC

## 2022-10-08 NOTE — Progress Notes (Signed)
HISTORY OF PRESENT ILLNESS:   Ryan [name] is a 77 y.o. male with past medical history as listed below.  He is accompanied by his wife Ryan [name].   The patient was last seen in the office April 01, 2021 guarding chronic GERD, intermittent solid food dysphagia, epigastric discomfort.  See dictation for details.  He subsequently underwent upper endoscopy May 07, 2021.  He was found to have a benign distal esophageal stricture which was dilated with 54 [place] Maloney dilator.  Patient tells me that dilation seem to help for just a short period of time.  He reports intermittent problems with solid food and large pill dysphagia.  He points to the cervical esophagus.  Can be severe at times.  Does have history of GERD for which she is on pantoprazole 40 mg daily.  No active symptoms.   Next, he and his wife inquire about colon cancer screening.  He did undergo colonoscopy with Dr. Jarold Motto in 2008.  He was found to have diverticulosis.  No other abnormalities.  In April 2021 he underwent Cologuard testing which was negative.  As he is due for a colon cancer screening strategy, he wishes to set up colonoscopy along with his endoscopy.  No lower GI complaints.   REVIEW OF SYSTEMS:   All non-GI ROS negative unless otherwise except for sinus and allergy, back pain, hearing problems, irregular heartbeats       Past Medical History:  Diagnosis Date   Arthritis 03/28/2013   Back pain     Cancer (HCC) 2014    prostate cancer   Cataracts, bilateral     Dysrhythmia      occasional PAC- asymptomatic, per MD pt cut back on caffeine   ED (erectile dysfunction) 12/18/2021    due to arterial insufficency   Fungal nail infection     Hearing loss      wear hearing aids   HSV infection 2016   Hyperlipidemia     Hypertension      no meds, dx yrs ago per patient but normal last several yrs   Hypertensive retinopathy      OU   Hypothyroidism     Personal history of other diseases of the digestive  system     Personal history of other diseases of the nervous system and sense organs 2014   Personal history of other endocrine, nutritional and metabolic disease 4431   Pneumonia      x 1   Primary testicular hypogonadism 2017   Retarded ejaculation 2021   Sleep apnea 2014           Past Surgical History:  Procedure Laterality Date   ankle surgery trauma Right     CATARACT EXTRACTION Left 11/29/2018    Dr. Nile Riggs   COLONOSCOPY       COSMETIC SURGERY        loose skin removed around neck area   eye lid surgery       EYE SURGERY       fungal nail       LASIK Bilateral     PARS PLANA VITRECTOMY Left 03/01/2019    Procedure: PARS PLANA VITRECTOMY WITH 25 GAUGE;  Surgeon: Rennis Chris, MD;  Location: [facility] OR;  Service: Ophthalmology;  Laterality: Left;   PROSTATE BIOPSY   06/21/2022    2020, 2018   REMOVAL RETAINED LENS Left 03/01/2019    Procedure: REMOVAL OF RETAINED LENS MATERIALS LEFT EYE;  Surgeon: Rennis Chris, MD;  Location: [facility] OR;  Service: Ophthalmology;  Laterality: Left;   VASECTOMY   1981   WISDOM TOOTH EXTRACTION          Social History MONTGOMERY WILLMANN  reports that he quit smoking about 41 years ago. His smoking use included cigarettes. He has never used smokeless tobacco. He reports current alcohol use of about 3.0 - 4.0 standard drinks of alcohol per week. He reports that he does not use drugs.   family history includes Alcohol abuse in his father; Arthritis in his sister; COPD in his sister; Cancer in his father; Heart disease in his mother; Liver disease in his father.        Allergies  Allergen Reactions   Bee Venom        Other reaction(s): Drowsy   Dust Mite Extract        UNSPECIFIED REACTION    Grass Extracts [Gramineae Pollens]        UNSPECIFIED REACTION           PHYSICAL EXAMINATION: Vital signs: BP 120/64   Pulse 69   Ht 5\' 8"  (1.727 m)   Wt 165 lb (74.8 kg)   BMI 25.09 kg/m   Constitutional: generally well-appearing, no acute  distress Psychiatric: alert and oriented x3, cooperative Eyes: extraocular movements intact, anicteric, conjunctiva pink Mouth: oral pharynx moist, no lesions Neck: supple no lymphadenopathy Cardiovascular: heart regular rate and rhythm, no murmur Lungs: clear to auscultation bilaterally Abdomen: soft, nontender, nondistended, no obvious ascites, no peritoneal signs, normal bowel sounds, no organomegaly Rectal: Deferred until colonoscopy Extremities: no clubbing, cyanosis, or edema lower extremity edema bilaterally Skin: no lesions on visible extremities Neuro: No focal deficits.  Cranial nerves intact   ASSESSMENT:   1.  GERD.  Classic symptoms controlled with PPI 2.  Recurrent intermittent solid food and pill dysphagia.  Points to the cervical esophagus.  Prior endoscopy revealed distal esophageal stricture which was dilated with 54 [place] Maloney dilator.  Patient does not feel he derive significant benefit post dilation.  Question hypertensive cricopharyngeus though no mention of such on his prior exam. 3.  Colon cancer screening.  Colonoscopy 2008 with diverticulosis.  Negative Cologuard testing April 2021.  Not requesting screening colonoscopy as he is up-to-date strategy 4.  General medical problems.  Stable   PLAN:   1.  Schedule upper endoscopy with esophageal dilation.The nature of the procedure, as well as the risks, benefits, and alternatives were carefully and thoroughly reviewed with the patient. Ample time for discussion and questions allowed. The patient understood, was satisfied, and agreed to proceed. 2.  Patient will be seen in follow-up at some point post dilation.  If symptoms persist, would persist with barium swallow with tablet 3.  Screening colonoscopy.  Polypectomy if necessary.The nature of the procedure, as well as the risks, benefits, and alternatives were carefully and thoroughly reviewed with the patient. Ample time for discussion and questions allowed. The  patient understood, was satisfied, and agreed to proceed. 4.  Continue PPI 5.  Reflux precautions 6.  Ongoing general medical care with PCP

## 2022-10-08 NOTE — Op Note (Signed)
[facility] Patient Name: Ryan [name] Procedure Date: 10/08/2022 1:25 PM MRN: [id number] Endoscopist: Wilhemina Bonito. Marina Goodell , MD, [id number] Age: 77 Referring MD:  Date of Birth: 1945/12/31 Gender: Male Account #: [id number] Procedure:                Upper GI endoscopy with balloon dilation of the                            esophagus. 76 [place] Indications:              Dysphagia, Therapeutic procedure, Esophageal reflux Medicines:                Monitored Anesthesia Care Procedure:                Pre-Anesthesia Assessment:                           - Prior to the procedure, a History and Physical                            was performed, and patient medications and                            allergies were reviewed. The patient's tolerance of                            previous anesthesia was also reviewed. The risks                            and benefits of the procedure and the sedation                            options and risks were discussed with the patient.                            All questions were answered, and informed consent                            was obtained. Prior Anticoagulants: The patient has                            taken no anticoagulant or antiplatelet agents. ASA                            Grade Assessment: II - A patient with mild systemic                            disease. After reviewing the risks and benefits,                            the patient was deemed in satisfactory condition to                            undergo the procedure.  After obtaining informed consent, the endoscope was                            passed under direct vision. Throughout the                            procedure, the patient's blood pressure, pulse, and                            oxygen saturations were monitored continuously. The                            Olympus Scope [id number] was introduced through the                             mouth, and advanced to the second part of duodenum.                            The upper GI endoscopy was accomplished without                            difficulty. The patient tolerated the procedure                            well. Scope In: Scope Out: Findings:                 One benign-appearing, intrinsic mild stenosis was                            found 37 cm from the incisors. This stenosis                            measured 1.5 cm (inner diameter). The scope was                            withdrawn. Dilation was performed with a Maloney                            dilator with no resistance at 54 Fr.                           The exam of the esophagus was otherwise normal.                           The stomach was normal. Small hiatal hernia.                           The examined duodenum was normal.                           The cardia and gastric fundus were normal on                            retroflexion. Complications:  No immediate complications. Estimated Blood Loss:     Estimated blood loss: none. Impression:               - Benign-appearing esophageal stenosis. Dilated.                           - Normal stomach.                           - Normal examined duodenum.                           - No specimens collected. Recommendation:           - Patient has a contact number available for                            emergencies. The signs and symptoms of potential                            delayed complications were discussed with the                            patient. Return to normal activities tomorrow.                            Written discharge instructions were provided to the                            patient.                           -Post dilation diet.                           - Continue present medications. Wilhemina Bonito. Marina Goodell, MD 10/08/2022 2:12:18 PM This report has been signed electronically.

## 2022-10-08 NOTE — Op Note (Signed)
[facility] Patient Name: Ryan [name] Procedure Date: 10/08/2022 1:26 PM MRN: [id number] Endoscopist: Wilhemina Bonito. Marina Goodell , MD, [id number] Age: 77 Referring MD:  Date of Birth: 05-May-1946 Gender: Male Account #: [id number] Procedure:                Colonoscopy with cold snare polypectomy x 4; biopsy                            polypectomy x 1 Indications:              Screening for colorectal malignant neoplasm.                            Negative index exam 2008. Negative Cologuard                            testing 2021 Medicines:                Monitored Anesthesia Care Procedure:                Pre-Anesthesia Assessment:                           - Prior to the procedure, a History and Physical                            was performed, and patient medications and                            allergies were reviewed. The patient's tolerance of                            previous anesthesia was also reviewed. The risks                            and benefits of the procedure and the sedation                            options and risks were discussed with the patient.                            All questions were answered, and informed consent                            was obtained. Prior Anticoagulants: The patient has                            taken no anticoagulant or antiplatelet agents.                            After reviewing the risks and benefits, the patient                            was deemed in satisfactory condition to undergo the  procedure.                           After obtaining informed consent, the colonoscope                            was passed under direct vision. Throughout the                            procedure, the patient's blood pressure, pulse, and                            oxygen saturations were monitored continuously. The                            Olympus [id number] SN [id number] was introduced through                             the anus and advanced to the the cecum, identified                            by appendiceal orifice and ileocecal valve. The                            ileocecal valve, appendiceal orifice, and rectum                            were photographed. The quality of the bowel                            preparation was excellent. The colonoscopy was                            performed without difficulty. The patient tolerated                            the procedure well. The bowel preparation used was                            SUPREP via split dose instruction. Scope In: 1:35:54 PM Scope Out: 1:56:20 PM Scope Withdrawal Time: 0 hours 16 minutes 41 seconds  Total Procedure Duration: 0 hours 20 minutes 26 seconds  Findings:                 Four polyps were found in the ascending colon and                            cecum. The polyps were 2 to 5 mm in size. These                            polyps were removed with a cold snare. Resection                            and retrieval were complete.  A less than 1 mm polyp was found in the transverse                            colon. The polyp was removed with a cold biopsy                            forceps. Resection and retrieval were complete.                           Multiple diverticula were found in the left colon                            and right colon.                           Internal hemorrhoids were found during                            retroflexion. The hemorrhoids were moderate.                           The exam was otherwise without abnormality on                            direct and retroflexion views. Complications:            No immediate complications. Estimated blood loss:                            None. Estimated Blood Loss:     Estimated blood loss: none. Impression:               - Four 2 to 5 mm polyps in the ascending colon and                            in the cecum, removed  with a cold snare. Resected                            and retrieved.                           - One less than 1 mm polyp in the transverse colon,                            removed with a cold biopsy forceps. Resected and                            retrieved.                           - Diverticulosis in the left colon and in the right                            colon.                           -  Internal hemorrhoids.                           - The examination was otherwise normal on direct                            and retroflexion views. Recommendation:           - Repeat colonoscopy is not recommended for                            surveillance.                           - Patient has a contact number available for                            emergencies. The signs and symptoms of potential                            delayed complications were discussed with the                            patient. Return to normal activities tomorrow.                            Written discharge instructions were provided to the                            patient.                           - Resume previous diet.                           - Continue present medications.                           - Await pathology results. Wilhemina Bonito. Marina Goodell, MD 10/08/2022 2:02:51 PM This report has been signed electronically.

## 2022-10-08 NOTE — Progress Notes (Signed)
Vss nad trans to pacu 

## 2022-10-08 NOTE — Progress Notes (Signed)
Called to room to assist during endoscopic procedure.  Patient ID and intended procedure confirmed with present staff. Received instructions for my participation in the procedure from the performing physician.  

## 2022-10-08 NOTE — Patient Instructions (Addendum)
Recommendation: - Patient has a contact number available for                            emergencies. The signs and symptoms of potential                            delayed complications were discussed with the                            patient. Return to normal activities tomorrow.                            Written discharge instructions were provided to the                            patient.                           -Post dilation diet.                           - Continue present medications.  Handouts on post dilatation diet, hemorrhoids, polyps and diverticulosis given.  YOU HAD AN ENDOSCOPIC PROCEDURE TODAY AT THE [facility]:   Refer to the procedure report that was given to you for any specific questions about what was found during the examination.  If the procedure report does not answer your questions, please call your gastroenterologist to clarify.  If you requested that your care partner not be given the details of your procedure findings, then the procedure report has been included in a sealed envelope for you to review at your convenience later.  YOU SHOULD EXPECT: Some feelings of bloating in the abdomen. Passage of more gas than usual.  Walking can help get rid of the air that was put into your GI tract during the procedure and reduce the bloating. If you had a lower endoscopy (such as a colonoscopy or flexible sigmoidoscopy) you may notice spotting of blood in your stool or on the toilet paper. If you underwent a bowel prep for your procedure, you may not have a normal bowel movement for a few days.  Please Note:  You might notice some irritation and congestion in your nose or some drainage.  This is from the oxygen used during your procedure.  There is no need for concern and it should clear up in a day or so.  SYMPTOMS TO REPORT IMMEDIATELY:  Following lower endoscopy (colonoscopy or flexible sigmoidoscopy):  Excessive amounts of blood in the stool  Significant  tenderness or worsening of abdominal pains  Swelling of the abdomen that is new, acute  Fever of 100F or higher  Following upper endoscopy (EGD)  Vomiting of blood or coffee ground material  New chest pain or pain under the shoulder blades  Painful or persistently difficult swallowing  New shortness of breath  Fever of 100F or higher  Black, tarry-looking stools  For urgent or emergent issues, a gastroenterologist can be reached at any hour by calling [phone]. Do not use MyChart messaging for urgent concerns.    DIET:  We do recommend a small meal at first, but then you may proceed to your regular diet.  Drink plenty  of fluids but you should avoid alcoholic beverages for 24 hours.  ACTIVITY:  You should plan to take it easy for the rest of today and you should NOT DRIVE or use heavy machinery until tomorrow (because of the sedation medicines used during the test).    FOLLOW UP: Our staff will call the number listed on your records the next business day following your procedure.  We will call around 7:15- 8:00 am to check on you and address any questions or concerns that you may have regarding the information given to you following your procedure. If we do not reach you, we will leave a message.     If any biopsies were taken you will be contacted by phone or by letter within the next 1-3 weeks.  Please call us at [phone] if you have not heard about the biopsies in 3 weeks.    SIGNATURES/CONFIDENTIALITY: You and/or your care partner have signed paperwork which will be entered into your electronic medical record.  These signatures attest to the fact that that the information above on your After Visit Summary has been reviewed and is understood.  Full responsibility of the confidentiality of this discharge information lies with you and/or your care-partner.

## 2022-10-08 NOTE — Progress Notes (Signed)
Patient reports no changes to health or medications since office visit.  

## 2022-10-11 ENCOUNTER — Telehealth: Payer: Self-pay | Admitting: *Deleted

## 2022-10-11 NOTE — Telephone Encounter (Signed)
  Follow up Call-     10/08/2022    1:18 PM 05/07/2021    9:33 AM  Call back number  Post procedure Call Back phone  # [phone]  Permission to leave phone message Yes Yes     Patient questions:  Do you have a fever, pain , or abdominal swelling? No. Pain Score  0 *  Have you tolerated food without any problems? Yes.    Have you been able to return to your normal activities? Yes.    Do you have any questions about your discharge instructions: Diet   No. Medications  No. Follow up visit  No.  Do you have questions or concerns about your Care? No.  Actions: * If pain score is 4 or above: No action needed, pain <4.

## 2022-10-13 ENCOUNTER — Telehealth: Payer: Self-pay | Admitting: Family Medicine

## 2022-10-13 NOTE — Telephone Encounter (Signed)
Called pt to sch AWV, left msg for call back.

## 2022-10-14 ENCOUNTER — Encounter: Payer: Self-pay | Admitting: Internal Medicine

## 2022-10-29 NOTE — Progress Notes (Signed)
CARDIOLOGY CONSULT NOTE       Patient ID: Ryan [name] MRN: [id number] DOB/AGE: [birth year] 77 y.o.  Admit date: (Not on file) Referring Physician: Clifton Custard Primary Physician: Kristian Covey, MD Primary Cardiologist: Eden Emms Reason for Consultation: Tachycardia   HPI:  77 y.o. referred by Dr Helayne Seminole for tachycardia. First seen by me 08/04/22 Patient has noted elevated HRls for a few months. He works out at gym and notes HR can remain elevated for 2-4 hours. Zio monitor ordered by primary reviewed and no dangerous arrhythmias. Some SVT but longest only 11 seconds and ? Atrial tachycardia. TTE done 06/09/22 showed normal EF and mild AR. ETT showed no pre excitation and no ischemia. He is retired from [employer] where he met his current wife.  He sees Dr Sander Radon but gets a lot of care at [place] He is retired Retail banker. Denies chest pain dyspnea, or syncope Active at gym and doing yard work   Concerned about beta blockers and EF Was started on lopressor   Coronary calcium score 267 which was 54 th percentile for age/sex Has some myalgias with lipitor LDL had been 133 and taking statin daily down to 101 ***  ***  ROS All other systems reviewed and negative except as noted above  Past Medical History:  Diagnosis Date  . Arthritis 03/28/2013  . Back pain   . Cancer [facility]) 2014   prostate cancer  . Cataracts, bilateral   . Dysrhythmia    occasional PAC- asymptomatic, per MD pt cut back on caffeine  . ED (erectile dysfunction) 12/18/2021   due to arterial insufficency  . Fungal nail infection   . Hearing loss    wear hearing aids  . HSV infection 2016  . Hyperlipidemia   . Hypertension    no meds, dx yrs ago per patient but normal last several yrs  . Hypertensive retinopathy    OU  . Hypothyroidism   . Personal history of other diseases of the digestive system   . Personal history of other diseases of the nervous system and sense organs 2014   . Personal history of other endocrine, nutritional and metabolic disease 4098  . Pneumonia    x 1  . Primary testicular hypogonadism 2017  . Retarded ejaculation 2021  . Sleep apnea 2014    Family History  Problem Relation Age of Onset  . Heart disease Mother   . Liver disease Father   . Alcohol abuse Father   . Cancer Father        hepatic cancer ?primary  . Arthritis Sister   . COPD Sister     Social History   Socioeconomic History  . Marital status: Married    Spouse name: Not on file  . Number of children: Not on file  . Years of education: Not on file  . Highest education level: Associate degree: occupational, Scientist, product/process development, or vocational program  Occupational History  . Not on file  Tobacco Use  . Smoking status: Former    Types: Cigarettes    Quit date: 07/05/1981    Years since quitting: 41.3  . Smokeless tobacco: Never  Vaping Use  . Vaping Use: Never used  Substance and Sexual Activity  . Alcohol use: Yes    Alcohol/week: 3.0 - 4.0 standard drinks of alcohol    Types: 3 - 4 Glasses of wine per week  . Drug use: No  . Sexual activity: Not on file  Other Topics Concern  .  Not on file  Social History Narrative  . Not on file   Social Determinants of Health   Financial Resource Strain: Low Risk  (09/15/2021)   Overall Financial Resource Strain (CARDIA)   . Difficulty of Paying Living Expenses: Not hard at all  Food Insecurity: No Food Insecurity (08/03/2022)   Hunger Vital Sign   . Worried About Programme researcher, broadcasting/film/video in the Last Year: Never true   . Ran Out of Food in the Last Year: Never true  Transportation Needs: No Transportation Needs (08/03/2022)   PRAPARE - Transportation   . Lack of Transportation (Medical): No   . Lack of Transportation (Non-Medical): No  Physical Activity: Sufficiently Active (09/15/2021)   Exercise Vital Sign   . Days of Exercise per Week: 3 days   . Minutes of Exercise per Session: 90 min  Stress: No Stress Concern Present  (09/15/2021)   [facility] [name] [employer] - Occupational Stress Questionnaire   . Feeling of Stress : Not at all  Social Connections: Socially Integrated (09/15/2021)   Social Connection and Isolation Panel [NHANES]   . Frequency of Communication with Friends and Family: Three times a week   . Frequency of Social Gatherings with Friends and Family: Once a week   . Attends Religious Services: More than 4 times per year   . Active Member of Clubs or Organizations: Yes   . Attends Banker Meetings: More than 4 times per year   . Marital Status: Married  Catering manager Violence: Not At Risk (08/03/2022)   Humiliation, Afraid, Rape, and Kick questionnaire   . Fear of Current or Ex-Partner: No   . Emotionally Abused: No   . Physically Abused: No   . Sexually Abused: No    Past Surgical History:  Procedure Laterality Date  . ankle surgery trauma Right   . CATARACT EXTRACTION Left 11/29/2018   Dr. Nile Riggs  . COLONOSCOPY    . COSMETIC SURGERY     loose skin removed around neck area  . eye lid surgery    . EYE SURGERY    . fungal nail    . LASIK Bilateral   . PARS PLANA VITRECTOMY Left 03/01/2019   Procedure: PARS PLANA VITRECTOMY WITH 25 GAUGE;  Surgeon: Rennis Chris, MD;  Location: [facility] OR;  Service: Ophthalmology;  Laterality: Left;  . PROSTATE BIOPSY  06/21/2022   2020, 2018  . REMOVAL RETAINED LENS Left 03/01/2019   Procedure: REMOVAL OF RETAINED LENS MATERIALS LEFT EYE;  Surgeon: Rennis Chris, MD;  Location: [facility] OR;  Service: Ophthalmology;  Laterality: Left;  Marland Kitchen VASECTOMY  1981  . WISDOM TOOTH EXTRACTION        Current Outpatient Medications:  .  Ascorbic Acid (VITAMIN C) 1000 MG tablet, Take 1,000 mg by mouth daily., Disp: , Rfl:  .  Cholecalciferol (VITAMIN D) 1000 UNITS capsule, Take 1,000 Units by mouth daily., Disp: , Rfl:  .  Coenzyme Q10 100 MG capsule, Take 100 mg by mouth daily., Disp: , Rfl:  .  Cyanocobalamin (VITAMIN B-12 CR) 1500 MCG  TBCR, Take 1,500 mcg by mouth daily., Disp: , Rfl:  .  EPINEPHrine 0.3 mg/0.3 mL IJ SOAJ injection, INJECT 0.3 ML INTRAMUSCULARLY ONCE AS NEEDED AS DIRECTED, Disp: , Rfl:  .  fluticasone (FLONASE) 50 MCG/ACT nasal spray, Place 1 spray into both nostrils daily as needed for allergies or rhinitis., Disp: , Rfl:  .  levothyroxine (SYNTHROID) 50 MCG tablet, TAKE ONE TABLET BY MOUTH DAILY FOR  HYPOTHYROIDISM, Disp: , Rfl:  .  liothyronine (CYTOMEL) 5 MCG tablet, Take 5 mcg by mouth daily., Disp: , Rfl:  .  loratadine (CLARITIN) 10 MG tablet, Take 10 mg by mouth daily as needed for allergies. , Disp: , Rfl:  .  MAGNESIUM CITRATE PO, Take 400 mg by mouth daily. , Disp: , Rfl:  .  metoprolol tartrate (LOPRESSOR) 25 MG tablet, Take 1 tablet (25 mg total) by mouth 2 (two) times daily., Disp: 180 tablet, Rfl: 3 .  pantoprazole (PROTONIX) 40 MG tablet, TAKE 1 TABLET BY MOUTH EVERY DAY, Disp: 90 tablet, Rfl: 1 .  RESTASIS 0.05 % ophthalmic emulsion, Place 1 drop into the left eye 2 (two) times daily., Disp: , Rfl:  .  rosuvastatin (CRESTOR) 10 MG tablet, Take 1 tablet (10 mg total) by mouth daily., Disp: 90 tablet, Rfl: 3 .  tadalafil (CIALIS) 5 MG tablet, Take 5 mg by mouth daily as needed for erectile dysfunction. , Disp: , Rfl:  .  valACYclovir (VALTREX) 500 MG tablet, TAKE ONE TABLET BY MOUTH TWICE DAILY FOR 3 DAYS AS NEEDED FOR HERPES FLARE, Disp: 180 tablet, Rfl: 0 .  vitamin E 400 UNIT capsule, Take 400 Units by mouth daily., Disp: , Rfl:  .  Zinc 50 MG CAPS, Take 50 mg by mouth daily., Disp: , Rfl:     Physical Exam: There were no vitals taken for this visit.   Affect appropriate Healthy:  appears stated age HEENT: hearing aids in place  Neck supple with no adenopathy JVP normal no bruits no thyromegaly Lungs clear with no wheezing and good diaphragmatic motion Heart:  S1/S2 no murmur, no rub, gallop or click PMI normal Abdomen: benighn, BS positve, no tenderness, no AAA no bruit.  No HSM or  HJR Distal pulses intact with no bruits No edema Neuro non-focal Skin warm and dry No muscular weakness   Labs:   Lab Results  Component Value Date   WBC 5.4 08/22/2020   HGB 16.6 08/22/2020   HCT 49 08/22/2020   MCV 88.7 03/01/2019   PLT 224 08/22/2020   No results for input(s): "NA", "K", "CL", "CO2", "BUN", "CREATININE", "CALCIUM", "PROT", "BILITOT", "ALKPHOS", "ALT", "AST", "GLUCOSE" in the last 168 hours.  Invalid input(s): "LABALBU" No results found for: "CKTOTAL", "CKMB", "CKMBINDEX", "TROPONINI"  Lab Results  Component Value Date   CHOL 193 08/22/2020   CHOL 198 03/01/2018   CHOL 194 08/20/2016   Lab Results  Component Value Date   HDL 77 (A) 08/22/2020   HDL 65.50 03/01/2018   HDL 56.00 08/20/2016   Lab Results  Component Value Date   LDLCALC 90 08/22/2020   LDLCALC 109 (H) 03/01/2018   LDLCALC 116 (H) 08/20/2016   Lab Results  Component Value Date   TRIG 130 08/22/2020   TRIG 116.0 03/01/2018   TRIG 109.0 08/20/2016   Lab Results  Component Value Date   CHOLHDL 3 03/01/2018   CHOLHDL 3 08/20/2016   CHOLHDL 3 02/17/2016   Lab Results  Component Value Date   LDLDIRECT 122.5 07/02/2013   LDLDIRECT 122.8 01/01/2013   LDLDIRECT 173.8 04/28/2012      Radiology: No results found.  EKG: NSR normal    ASSESSMENT AND PLAN:   Tachcyardia:  benign monitor with self limited SVT/atrial tachycardia. Continue lopressor TE with normal EF and normal ETT HLD:  on statin high calcium score for age *** Synthroid:  on synthroid replacement f/u labs with primary    F/U in 6 months  Signed: Charlton Haws 10/29/2022, 7:25 AM

## 2022-11-01 NOTE — Progress Notes (Signed)
Patient has consult on 1/30 for his stage T1c adenocarcinoma of the prostate with Gleason Score of 3+4, and PSA of 2.91, and decided to proceed with brachytherapy.   Patient has now decided to continue under active surveillance until he establishes care with a new urologist due to Dr. Lenoria Chime upcoming retirement.  Pt is scheduled for PSA check on 5/22 followed my MD visit a week later.

## 2022-11-02 ENCOUNTER — Encounter: Payer: Self-pay | Admitting: Cardiovascular Disease

## 2022-11-02 ENCOUNTER — Ambulatory Visit: Payer: Medicare Other | Attending: Cardiovascular Disease | Admitting: Cardiovascular Disease

## 2022-11-02 VITALS — BP 132/66 | HR 59 | Ht 69.0 in | Wt 168.0 lb

## 2022-11-02 DIAGNOSIS — I471 Supraventricular tachycardia, unspecified: Secondary | ICD-10-CM | POA: Diagnosis not present

## 2022-11-02 DIAGNOSIS — I10 Essential (primary) hypertension: Secondary | ICD-10-CM | POA: Insufficient documentation

## 2022-11-02 DIAGNOSIS — E785 Hyperlipidemia, unspecified: Secondary | ICD-10-CM | POA: Diagnosis not present

## 2022-11-02 MED ORDER — ATORVASTATIN CALCIUM 20 MG PO TABS: 20.0000 mg | ORAL_TABLET | Freq: Every day | ORAL | 3 refills | Status: DC

## 2022-11-02 NOTE — Patient Instructions (Addendum)
Medication Instructions:  Your physician has recommended you make the following change in your medication:  1-TAKE Lipitor 20 mg by mouth daily    *If you need a refill on your cardiac medications before your next appointment, please call your pharmacy*  Lab Work: Your physician recommends that you return for lab work this week for fasting lipid and liver panel.   If you have labs (blood work) drawn today and your tests are completely normal, you will receive your results only by: MyChart Message (if you have MyChart) OR A paper copy in the mail If you have any lab test that is abnormal or we need to change your treatment, we will call you to review the results.  Testing/Procedures: None ordered today.  Follow-Up: At [facility], you and your health needs are our priority.  As part of our continuing mission to provide you with exceptional heart care, we have created designated Provider Care Teams.  These Care Teams include your primary Cardiologist (physician) and Advanced Practice Providers (APPs -  Physician Assistants and Nurse Practitioners) who all work together to provide you with the care you need, when you need it.  We recommend signing up for the patient portal called "MyChart".  Sign up information is provided on this After Visit Summary.  MyChart is used to connect with patients for Virtual Visits (Telemedicine).  Patients are able to view lab/test results, encounter notes, upcoming appointments, etc.  Non-urgent messages can be sent to your provider as well.   To learn more about what you can do with MyChart, go to [email or web address].    Your next appointment:   6 month(s)  Provider:   Charlton Haws, MD

## 2022-11-04 ENCOUNTER — Ambulatory Visit: Payer: Medicare Other | Attending: Cardiovascular Disease

## 2022-11-04 DIAGNOSIS — E785 Hyperlipidemia, unspecified: Secondary | ICD-10-CM | POA: Diagnosis not present

## 2022-11-04 DIAGNOSIS — I10 Essential (primary) hypertension: Secondary | ICD-10-CM

## 2022-11-04 LAB — HEPATIC FUNCTION PANEL
ALT: 27 IU/L (ref 0–44)
AST: 31 IU/L (ref 0–40)
Albumin: 4.6 g/dL (ref 3.8–4.8)
Alkaline Phosphatase: 89 IU/L (ref 44–121)
Bilirubin Total: 0.7 mg/dL (ref 0.0–1.2)
Bilirubin, Direct: 0.19 mg/dL (ref 0.00–0.40)
Total Protein: 6.8 g/dL (ref 6.0–8.5)

## 2022-11-04 LAB — LIPID PANEL
Chol/HDL Ratio: 2.9 ratio (ref 0.0–5.0)
Cholesterol, Total: 197 mg/dL (ref 100–199)
HDL: 68 mg/dL (ref >39)
LDL Chol Calc (NIH): 110 mg/dL — ABNORMAL HIGH (ref 0–99)
Triglycerides: 107 mg/dL (ref 0–149)
VLDL Cholesterol Cal: 19 mg/dL (ref 5–40)

## 2022-11-08 IMAGING — DX DG CHEST 2V
2 series · 2 of 2 positions shown · non-contrast
Comparison: May 29, 2018

CLINICAL DATA: Status post choking episode.

EXAM:
CHEST - 2 VIEW

[chest pa]
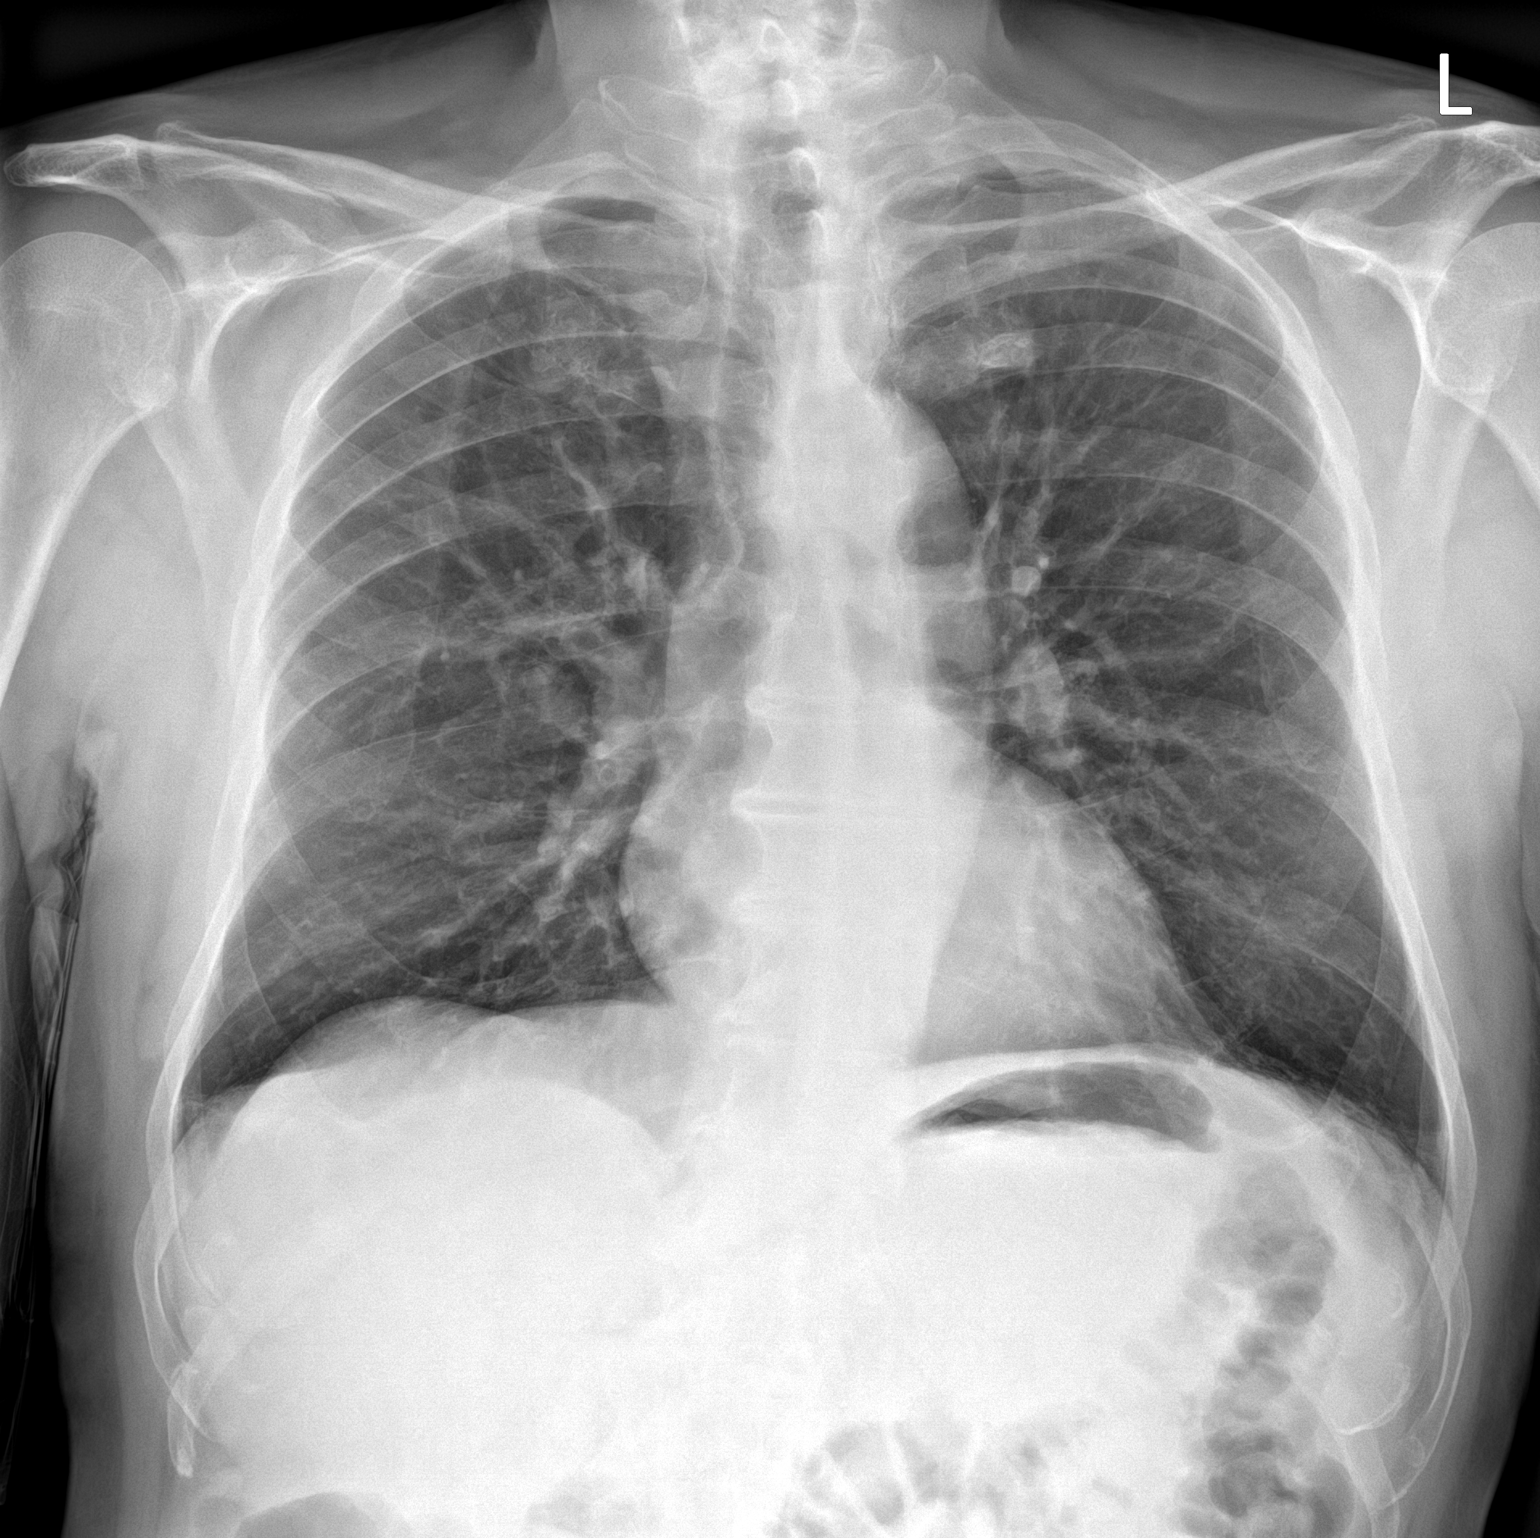

[chest lat]
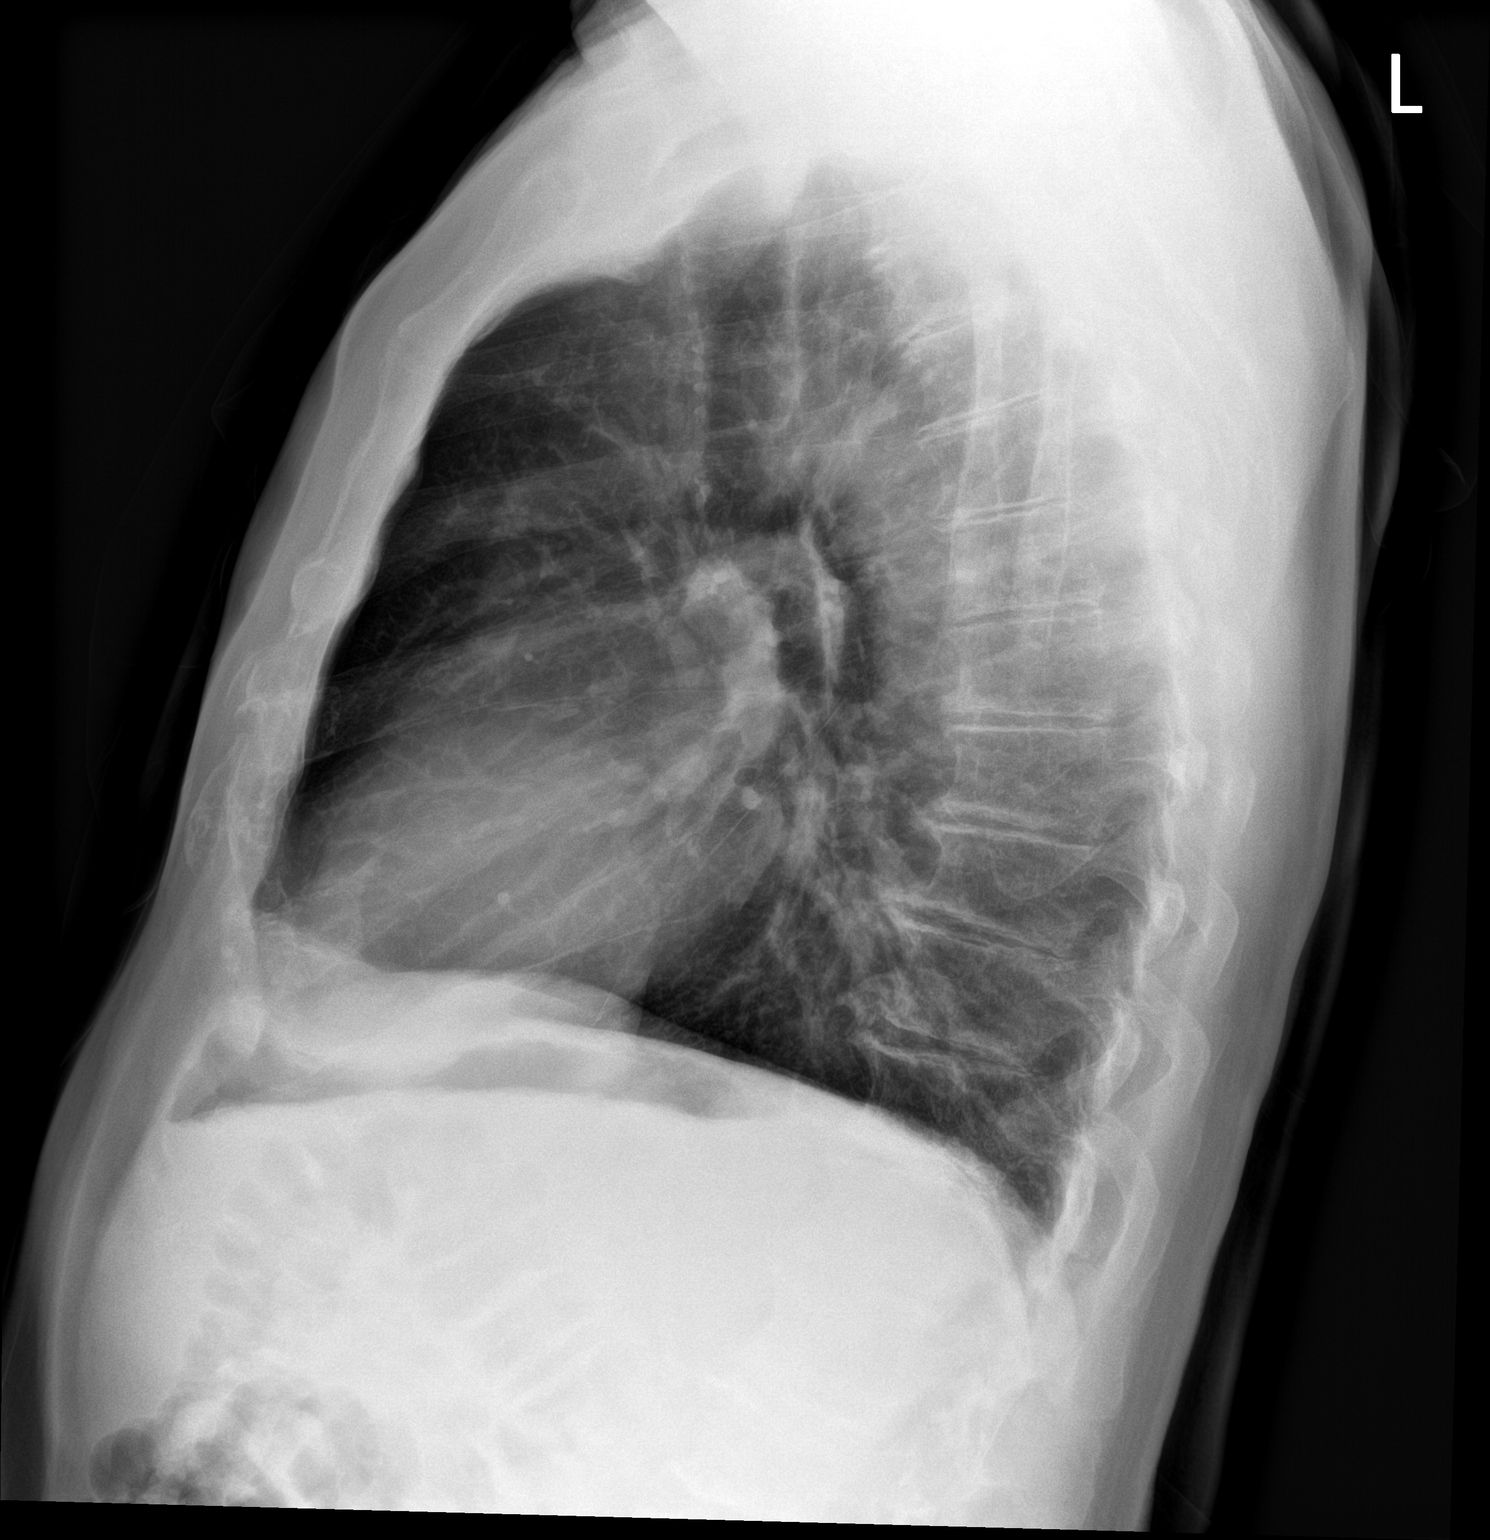

[2 of 2 positions shown; findings below may reference images not displayed]

FINDINGS: The heart size and mediastinal contours are within normal limits.
Mild, stable chronic appearing increased lung markings are noted.
Both lungs are otherwise clear. Degenerative changes seen throughout
the thoracic spine.
IMPRESSION: No active cardiopulmonary disease.

## 2022-11-18 ENCOUNTER — Encounter (HOSPITAL_BASED_OUTPATIENT_CLINIC_OR_DEPARTMENT_OTHER): Payer: Self-pay

## 2022-11-18 ENCOUNTER — Ambulatory Visit (HOSPITAL_BASED_OUTPATIENT_CLINIC_OR_DEPARTMENT_OTHER): Admit: 2022-11-18 | Payer: No Typology Code available for payment source | Admitting: Urology

## 2022-11-18 SURGERY — INSERTION, RADIATION SOURCE, PROSTATE
Anesthesia: Choice

## 2022-11-24 DIAGNOSIS — C61 Malignant neoplasm of prostate: Secondary | ICD-10-CM | POA: Diagnosis not present

## 2022-12-01 DIAGNOSIS — C61 Malignant neoplasm of prostate: Secondary | ICD-10-CM | POA: Diagnosis not present

## 2022-12-01 DIAGNOSIS — N5201 Erectile dysfunction due to arterial insufficiency: Secondary | ICD-10-CM | POA: Diagnosis not present

## 2022-12-06 ENCOUNTER — Encounter: Payer: Self-pay | Admitting: Cardiovascular Disease

## 2022-12-09 ENCOUNTER — Other Ambulatory Visit: Payer: Self-pay | Admitting: Urology

## 2022-12-10 ENCOUNTER — Telehealth: Payer: Self-pay | Admitting: *Deleted

## 2022-12-10 NOTE — Telephone Encounter (Signed)
CALLED PATIENT TO INFORM OF PRE-SEED APPTS. AND IMPLANT DATE, SPOKE WITH PATIENT AND HE IS AWARE OF THESE APPTS. 

## 2022-12-15 ENCOUNTER — Telehealth: Payer: Self-pay | Admitting: *Deleted

## 2022-12-15 NOTE — Telephone Encounter (Signed)
CALLED PATIENT TO REMIND OF PRE-SEED APPTS. FOR 12-16-22, SPOKE WITH PATIENT'S WIFE- DONNA AND SHE IS AWARE OF THESE APPTS.

## 2022-12-16 ENCOUNTER — Encounter: Payer: Self-pay | Admitting: Urology

## 2022-12-16 ENCOUNTER — Ambulatory Visit
Admission: RE | Admit: 2022-12-16 | Discharge: 2022-12-16 | Disposition: A | Discharge status: Home / Self Care | Payer: Medicare Other | Source: Ambulatory Visit | Attending: Radiation Oncology | Admitting: Radiation Oncology

## 2022-12-16 ENCOUNTER — Other Ambulatory Visit: Payer: Self-pay

## 2022-12-16 ENCOUNTER — Ambulatory Visit
Admission: RE | Admit: 2022-12-16 | Discharge: 2022-12-16 | Disposition: A | Discharge status: Home / Self Care | Payer: Medicare Other | Source: Ambulatory Visit | Attending: Urology | Admitting: Urology

## 2022-12-16 VITALS — Resp 19 | Ht 69.0 in | Wt 170.0 lb

## 2022-12-16 DIAGNOSIS — Z510 Encounter for antineoplastic radiation therapy: Secondary | ICD-10-CM | POA: Insufficient documentation

## 2022-12-16 DIAGNOSIS — C61 Malignant neoplasm of prostate: Secondary | ICD-10-CM | POA: Insufficient documentation

## 2022-12-16 DIAGNOSIS — Z191 Hormone sensitive malignancy status: Secondary | ICD-10-CM | POA: Diagnosis not present

## 2022-12-16 NOTE — Progress Notes (Signed)
Radiation Oncology         [phone]) [phone] ________________________________  Outpatient Follow up- Pre-seed visit  Name: Ryan [name] MRN: [id number]  Date: 12/16/2022  DOB: 06-18-1946  [id number], Elberta Fortis, MD  Marcine Matar, MD   REFERRING PHYSICIAN: Marcine Matar, MD  DIAGNOSIS: 77 y.o. gentleman with Stage T1c adenocarcinoma of the prostate with Gleason score of 3+4, and PSA of 2.91.     ICD-10-CM   1. Prostate cancer (HCC)  C61       HISTORY OF PRESENT ILLNESS: Ryan [name] is a 77 y.o. male with a diagnosis of prostate cancer.  He was initially diagnosed with low volume Gleason 3+3 prostate cancer in 04/2013. He has been on active surveillance since that time. He has undergone surveillance biopsies in 09/2013, 01/2015, 02/2017, and 02/2019, all of which continued to show Gleason 3+3 disease. An Oncotype DX test was performed on his 01/2015 biopsy and showed a score of 24, indicating low risk prostate cancer. His PSA has fluctuated but remained stable in the upper 2 range.   More recently, he underwent surveillance prostate MRI on 06/07/22 which showed two lesions in the left peripheral zone, one PI-RADS 4 and one PIRADS 3. A repeat PSA performed 06/16/22 confirmed stability at 2.91. The patient proceeded to MRI fusion biopsy of the prostate on 06/21/22.  The prostate volume measured 31.8 cc.  Out of 20 core biopsies, 7 were positive.  The maximum Gleason score was 3+4, and this was seen in the left base lateral and right apex. Additionally, Gleason 3+3 was seen in the left mid (small focus), left mid lateral, and three of four samples from the MRI ROI lesion #1.  The patient reviewed the biopsy results with his urologist and was kindly referred to [place] for discussion of potential radiation treatment options. We initially met the patient on 08/03/22 and he was most interested in proceeding with brachytherapy and SpaceOAR gel placement, in the summer, once they returned from a  planned vacation, for treatment of his disease. He is here today for his pre-procedure imaging for planning and to answer any additional questions he may have about this treatment.   PREVIOUS RADIATION THERAPY: No  PAST MEDICAL HISTORY:  Past Medical History:  Diagnosis Date   Arthritis 03/28/2013   Back pain    Cancer (HCC) 2014   prostate cancer   Cataracts, bilateral    Dysrhythmia    occasional PAC- asymptomatic, per MD pt cut back on caffeine   ED (erectile dysfunction) 12/18/2021   due to arterial insufficency   Fungal nail infection    Hearing loss    wear hearing aids   HSV infection 2016   Hyperlipidemia    Hypertension    no meds, dx yrs ago per patient but normal last several yrs   Hypertensive retinopathy    OU   Hypothyroidism    Personal history of other diseases of the digestive system    Personal history of other diseases of the nervous system and sense organs 2014   Personal history of other endocrine, nutritional and metabolic disease 6213   Pneumonia    x 1   Primary testicular hypogonadism 2017   Retarded ejaculation 2021   Sleep apnea 2014      PAST SURGICAL HISTORY: Past Surgical History:  Procedure Laterality Date   ankle surgery trauma Right    CATARACT EXTRACTION Left 11/29/2018   Dr. Nile Riggs   COLONOSCOPY     COSMETIC SURGERY  loose skin removed around neck area   eye lid surgery     EYE SURGERY     fungal nail     LASIK Bilateral    PARS PLANA VITRECTOMY Left 03/01/2019   Procedure: PARS PLANA VITRECTOMY WITH 25 GAUGE;  Surgeon: Rennis Chris, MD;  Location: [facility] OR;  Service: Ophthalmology;  Laterality: Left;   PROSTATE BIOPSY  06/21/2022   2020, 2018   REMOVAL RETAINED LENS Left 03/01/2019   Procedure: REMOVAL OF RETAINED LENS MATERIALS LEFT EYE;  Surgeon: Rennis Chris, MD;  Location: [facility] OR;  Service: Ophthalmology;  Laterality: Left;   VASECTOMY  1981   WISDOM TOOTH EXTRACTION      FAMILY HISTORY:  Family History  Problem  Relation Age of Onset   Heart disease Mother    Liver disease Father    Alcohol abuse Father    Cancer Father        hepatic cancer ?primary   Arthritis Sister    COPD Sister     SOCIAL HISTORY:  Social History   Socioeconomic History   Marital status: Married    Spouse name: Not on file   Number of children: Not on file   Years of education: Not on file   Highest education level: Associate degree: occupational, Scientist, product/process development, or vocational program  Occupational History   Not on file  Tobacco Use   Smoking status: Former    Types: Cigarettes    Quit date: 07/05/1981    Years since quitting: 41.4   Smokeless tobacco: Never  Vaping Use   Vaping Use: Never used  Substance and Sexual Activity   Alcohol use: Yes    Alcohol/week: 3.0 - 4.0 standard drinks of alcohol    Types: 3 - 4 Glasses of wine per week   Drug use: No   Sexual activity: Not on file  Other Topics Concern   Not on file  Social History Narrative   Not on file   Social Determinants of Health   Financial Resource Strain: Low Risk  (09/15/2021)   Overall Financial Resource Strain (CARDIA)    Difficulty of Paying Living Expenses: Not hard at all  Food Insecurity: No Food Insecurity (08/03/2022)   Hunger Vital Sign    Worried About Running Out of Food in the Last Year: Never true    Ran Out of Food in the Last Year: Never true  Transportation Needs: No Transportation Needs (08/03/2022)   PRAPARE - Administrator, Civil Service (Medical): No    Lack of Transportation (Non-Medical): No  Physical Activity: Sufficiently Active (09/15/2021)   Exercise Vital Sign    Days of Exercise per Week: 3 days    Minutes of Exercise per Session: 90 min  Stress: No Stress Concern Present (09/15/2021)   [employer] of Occupational Health - Occupational Stress Questionnaire    Feeling of Stress : Not at all  Social Connections: Socially Integrated (09/15/2021)   Social Connection and Isolation Panel [NHANES]     Frequency of Communication with Friends and Family: Three times a week    Frequency of Social Gatherings with Friends and Family: Once a week    Attends Religious Services: More than 4 times per year    Active Member of [employer] or Organizations: Yes    Attends Banker Meetings: More than 4 times per year    Marital Status: Married  Catering manager Violence: Not At Risk (08/03/2022)   Humiliation, Afraid, Rape, and Kick questionnaire  Fear of Current or Ex-Partner: No    Emotionally Abused: No    Physically Abused: No    Sexually Abused: No    ALLERGIES: Bee venom, Dust mite extract, and Grass extracts [gramineae pollens]  MEDICATIONS:  Current Outpatient Medications  Medication Sig Dispense Refill   Ascorbic Acid (VITAMIN C) 1000 MG tablet Take 1,000 mg by mouth daily.     atorvastatin (LIPITOR) 20 MG tablet Take 1 tablet (20 mg total) by mouth daily. 90 tablet 3   Cholecalciferol (VITAMIN D) 1000 UNITS capsule Take 1,000 Units by mouth daily.     Coenzyme Q10 100 MG capsule Take 100 mg by mouth daily.     Cyanocobalamin (VITAMIN B-12 CR) 1500 MCG TBCR Take 1,500 mcg by mouth daily.     EPINEPHrine 0.3 mg/0.3 mL IJ SOAJ injection INJECT 0.3 ML INTRAMUSCULARLY ONCE AS NEEDED AS DIRECTED     fluticasone (FLONASE) 50 MCG/ACT nasal spray Place 1 spray into both nostrils daily as needed for allergies or rhinitis.     levothyroxine (SYNTHROID) 50 MCG tablet TAKE ONE TABLET BY MOUTH DAILY FOR HYPOTHYROIDISM     liothyronine (CYTOMEL) 5 MCG tablet Take 5 mcg by mouth daily.     loratadine (CLARITIN) 10 MG tablet Take 10 mg by mouth daily as needed for allergies.      MAGNESIUM CITRATE PO Take 400 mg by mouth daily.      metoprolol tartrate (LOPRESSOR) 25 MG tablet Take 1 tablet (25 mg total) by mouth 2 (two) times daily. 180 tablet 3   pantoprazole (PROTONIX) 40 MG tablet TAKE 1 TABLET BY MOUTH EVERY DAY 90 tablet 1   RESTASIS 0.05 % ophthalmic emulsion Place 1 drop into the left  eye 2 (two) times daily.     tadalafil (CIALIS) 5 MG tablet Take 5 mg by mouth daily as needed for erectile dysfunction.      valACYclovir (VALTREX) 500 MG tablet TAKE ONE TABLET BY MOUTH TWICE DAILY FOR 3 DAYS AS NEEDED FOR HERPES FLARE 180 tablet 0   vitamin E 400 UNIT capsule Take 400 Units by mouth daily.     Zinc 50 MG CAPS Take 50 mg by mouth daily.     No current facility-administered medications for this visit.    REVIEW OF SYSTEMS:  On review of systems, the patient reports that he is doing well overall. He denies any chest pain, shortness of breath, cough, fevers, chills, night sweats, unintended weight changes. He denies any bowel disturbances, and denies abdominal pain, nausea or vomiting. He denies any new musculoskeletal or joint aches or pains. His IPSS was 15, indicating moderate urinary symptoms. His SHIM was 19, indicating he has mild erectile dysfunction. A complete review of systems is obtained and is otherwise negative.  PHYSICAL EXAM:  Wt Readings from Last 3 Encounters:  11/02/22 168 lb (76.2 kg)  10/08/22 165 lb (74.8 kg)  09/02/22 165 lb (74.8 kg)   Temp Readings from Last 3 Encounters:  10/08/22 (!) 97 F (36.1 C)  08/03/22 97.7 F (36.5 C)  06/04/22 98.3 F (36.8 C) (Oral)   BP Readings from Last 3 Encounters:  11/02/22 132/66  10/08/22 126/66  09/02/22 120/64   Pulse Readings from Last 3 Encounters:  11/02/22 (!) 59  10/08/22 63  09/02/22 69    /10  In general this is a well appearing Caucasian male in no acute distress. He's alert and oriented x4 and appropriate throughout the examination. Cardiopulmonary assessment is negative for acute distress,  and he exhibits normal effort.     KPS = 100  100 - Normal; no complaints; no evidence of disease. 90   - Able to carry on normal activity; minor signs or symptoms of disease. 80   - Normal activity with effort; some signs or symptoms of disease. 31   - Cares for self; unable to carry on normal  activity or to do active work. 60   - Requires occasional assistance, but is able to care for most of his personal needs. 50   - Requires considerable assistance and frequent medical care. 40   - Disabled; requires special care and assistance. 30   - Severely disabled; hospital admission is indicated although death not imminent. 20   - Very sick; hospital admission necessary; active supportive treatment necessary. 10   - Moribund; fatal processes progressing rapidly. 0     - Dead  Karnofsky DA, Abelmann WH, Craver LS and Burchenal [facility] [phone]) The use of the nitrogen mustards in the palliative treatment of carcinoma: with particular reference to bronchogenic carcinoma Cancer 1 634-56  LABORATORY DATA:  Lab Results  Component Value Date   WBC 5.4 08/22/2020   HGB 16.6 08/22/2020   HCT 49 08/22/2020   MCV 88.7 03/01/2019   PLT 224 08/22/2020   Lab Results  Component Value Date   NA 135 (A) 08/22/2020   K 4.2 08/22/2020   CL 105 08/22/2020   CO2 27 (A) 08/22/2020   Lab Results  Component Value Date   ALT 27 11/04/2022   AST 31 11/04/2022   ALKPHOS 89 11/04/2022   BILITOT 0.7 11/04/2022     RADIOGRAPHY: No results found.    IMPRESSION/PLAN: 1. 77 y.o. gentleman with Stage T1c adenocarcinoma of the prostate with Gleason score of 3+4, and PSA of 2.91.  The patient has elected to proceed with seed implant for treatment of his disease. We reviewed the risks, benefits, short and long-term effects associated with brachytherapy and discussed the role of SpaceOAR in reducing the rectal toxicity associated with radiotherapy.  He appears to have a good understanding of his disease and our treatment recommendations which are of curative intent.  He was encouraged to ask questions that were answered to his stated satisfaction. He has freely signed written consent to proceed today in the office and a copy of this document will be placed in his medical record. His procedure is tentatively scheduled  for 01/31/23 in collaboration with Dr. Cardell Peach and we will see him back for his post-procedure visit approximately 3 weeks thereafter. We look forward to continuing to participate in his care. He knows that he is welcome to call with any questions or concerns at any time in the interim.  I personally spent 30 minutes in this encounter including chart review, reviewing radiological studies, meeting face-to-face with the patient, entering orders and completing documentation.    Marguarite Arbour, MMS, PA-C [facility] Radiation Oncology Physician Assistant Direct Dial: [phone]  Fax: [phone]

## 2022-12-16 NOTE — Progress Notes (Signed)
Pre-seed nursing interview for a diagnosis of Stage T1c adenocarcinoma of the prostate with Gleason score of 3+4, and PSA of 2.91.  Patient identity verified x2. Patient reports urine stream is slow to start. No other issues conveyed at this time.  Meaningful use complete.  Urinary Management medication(s)- None Urology appointment date- Gay, with Dr. Cardell Peach at 01/10/2023   Resp 19   Ht 5\' 9"  (1.753 m)   Wt 170 lb (77.1 kg)   BMI 25.10 kg/m   This concludes the interview.   Ruel Favors, LPN  .

## 2022-12-16 NOTE — Progress Notes (Signed)
  Radiation Oncology         [phone] ________________________________  Name: Ryan [name] MRN: [id number]  Date: 12/16/2022  DOB: Jul 07, 1945  SIMULATION AND TREATMENT PLANNING NOTE PUBIC ARCH STUDY  [id number], Elberta Fortis, MD  Marcine Matar, MD  DIAGNOSIS: 77 y.o. gentleman with Stage T1c adenocarcinoma of the prostate with Gleason score of 3+4, and PSA of 2.91.   Oncology History  Prostate cancer (HCC)  02/14/2014 Initial Diagnosis   Prostate cancer (HCC)   06/21/2022 Cancer Staging   Staging form: Prostate, AJCC 8th Edition - Clinical stage from 06/21/2022: Stage IIB (cT1c, cN0, cM0, PSA: 2.4, Grade Group: 2) - Signed by Marcello Fennel, PA-C on 08/03/2022 Histopathologic type: Adenocarcinoma, NOS Stage prefix: Initial diagnosis Prostate specific antigen (PSA) range: Less than 10 Gleason primary pattern: 3 Gleason secondary pattern: 4 Gleason score: 7 Histologic grading system: 5 grade system Number of biopsy cores examined: 20 Number of biopsy cores positive: 5 Location of positive needle core biopsies: Both sides       ICD-10-CM   1. Prostate cancer (HCC)  C61       COMPLEX SIMULATION:  The patient presented today for evaluation for possible prostate seed implant. He was brought to the radiation planning suite and placed supine on the CT couch. A 3-dimensional image study set was obtained in upload to the planning computer. There, on each axial slice, I contoured the prostate gland. Then, using three-dimensional radiation planning tools I reconstructed the prostate in view of the structures from the transperineal needle pathway to assess for possible pubic arch interference. In doing so, I did not appreciate any pubic arch interference. Also, the patient's prostate volume was estimated based on the drawn structure. The volume was 31 cc.  Given the pubic arch appearance and prostate volume, patient remains a good candidate to proceed with prostate seed implant.  Today, he freely provided informed written consent to proceed.    PLAN: The patient will undergo prostate seed implant.   ________________________________  Artist Pais. Kathrynn Running, M.D.

## 2022-12-17 NOTE — Progress Notes (Signed)
[facility] - Clinic Note  12/24/2022     CHIEF COMPLAINT Patient presents for Retina Follow Up    HISTORY OF PRESENT ILLNESS: Ryan [name] is a 77 y.o. male who presents to the clinic today for:  HPI     Retina Follow Up   Patient presents with  Other.  In left eye.  This started 1 year ago.  I, the attending physician,  performed the HPI with the patient and updated documentation appropriately.        Comments   Patient here for 1 year retina follow up for retained lens material after cataract surgery OS. Patient states vision doing good. No eye pain.       Last edited by Rennis Chris, MD on 12/25/2022  2:26 AM.    Pt states he is still following with Dr. Dione Booze once a year  Referring physician: Olivia Canter, MD [address] [place],  [place] [address]  HISTORICAL INFORMATION:   Selected notes from the MEDICAL RECORD NUMBER Referred by Dr. Jethro Bolus for concern of possible endophthalmitis OS LEE: 05.29.20 Clovia Cuff) Ocular Hx-s/p cat sx 05.27.20   CURRENT MEDICATIONS: Current Outpatient Medications (Ophthalmic Drugs)  Medication Sig   RESTASIS 0.05 % ophthalmic emulsion Place 1 drop into the left eye 2 (two) times daily.   No current facility-administered medications for this visit. (Ophthalmic Drugs)   Current Outpatient Medications (Other)  Medication Sig   Ascorbic Acid (VITAMIN C) 1000 MG tablet Take 1,000 mg by mouth daily.   atorvastatin (LIPITOR) 20 MG tablet Take 1 tablet (20 mg total) by mouth daily.   Cholecalciferol (VITAMIN D) 1000 UNITS capsule Take 1,000 Units by mouth daily.   Coenzyme Q10 100 MG capsule Take 100 mg by mouth daily.   Cyanocobalamin (VITAMIN B-12 CR) 1500 MCG TBCR Take 1,500 mcg by mouth daily.   EPINEPHrine 0.3 mg/0.3 mL IJ SOAJ injection INJECT 0.3 ML INTRAMUSCULARLY ONCE AS NEEDED AS DIRECTED   fluticasone (FLONASE) 50 MCG/ACT nasal spray Place 1 spray into both nostrils daily as needed  for allergies or rhinitis.   levothyroxine (SYNTHROID) 50 MCG tablet TAKE ONE TABLET BY MOUTH DAILY FOR HYPOTHYROIDISM   liothyronine (CYTOMEL) 5 MCG tablet Take 5 mcg by mouth daily.   loratadine (CLARITIN) 10 MG tablet Take 10 mg by mouth daily as needed for allergies.    MAGNESIUM CITRATE PO Take 400 mg by mouth daily.    metoprolol tartrate (LOPRESSOR) 25 MG tablet Take 1 tablet (25 mg total) by mouth 2 (two) times daily.   pantoprazole (PROTONIX) 40 MG tablet TAKE 1 TABLET BY MOUTH EVERY DAY   tadalafil (CIALIS) 5 MG tablet Take 5 mg by mouth daily as needed for erectile dysfunction.    valACYclovir (VALTREX) 500 MG tablet TAKE ONE TABLET BY MOUTH TWICE DAILY FOR 3 DAYS AS NEEDED FOR HERPES FLARE   vitamin E 400 UNIT capsule Take 400 Units by mouth daily.   Zinc 50 MG CAPS Take 50 mg by mouth daily.   No current facility-administered medications for this visit. (Other)   REVIEW OF SYSTEMS: ROS   Positive for: Skin, Genitourinary, Eyes Negative for: Constitutional, Gastrointestinal, Neurological, Musculoskeletal, HENT, Endocrine, Cardiovascular, Respiratory, Psychiatric, Allergic/Imm, Heme/Lymph Last edited by Laddie Aquas, COA on 12/24/2022  9:37 AM.      ALLERGIES Allergies  Allergen Reactions   Bee Venom     Other reaction(s): Drowsy   Dust Mite Extract  UNSPECIFIED REACTION    Grass Extracts [Gramineae Pollens]     UNSPECIFIED REACTION    PAST MEDICAL HISTORY Past Medical History:  Diagnosis Date   Arthritis 03/28/2013   Back pain    Cancer (HCC) 2014   prostate cancer   Cataracts, bilateral    Dysrhythmia    occasional PAC- asymptomatic, per MD pt cut back on caffeine   ED (erectile dysfunction) 12/18/2021   due to arterial insufficency   Fungal nail infection    Hearing loss    wear hearing aids   HSV infection 2016   Hyperlipidemia    Hypertension    no meds, dx yrs ago per patient but normal last several yrs   Hypertensive retinopathy    OU    Hypothyroidism    Personal history of other diseases of the digestive system    Personal history of other diseases of the nervous system and sense organs 2014   Personal history of other endocrine, nutritional and metabolic disease 1610   Pneumonia    x 1   Primary testicular hypogonadism 2017   Retarded ejaculation 2021   Sleep apnea 2014   Past Surgical History:  Procedure Laterality Date   ankle surgery trauma Right    CATARACT EXTRACTION Left 11/29/2018   Dr. Nile Riggs   COLONOSCOPY     COSMETIC SURGERY     loose skin removed around neck area   eye lid surgery     EYE SURGERY     fungal nail     LASIK Bilateral    PARS PLANA VITRECTOMY Left 03/01/2019   Procedure: PARS PLANA VITRECTOMY WITH 25 GAUGE;  Surgeon: Rennis Chris, MD;  Location: [facility] OR;  Service: Ophthalmology;  Laterality: Left;   PROSTATE BIOPSY  06/21/2022   2020, 2018   REMOVAL RETAINED LENS Left 03/01/2019   Procedure: REMOVAL OF RETAINED LENS MATERIALS LEFT EYE;  Surgeon: Rennis Chris, MD;  Location: [facility] OR;  Service: Ophthalmology;  Laterality: Left;   VASECTOMY  1981   WISDOM TOOTH EXTRACTION     FAMILY HISTORY Family History  Problem Relation Age of Onset   Heart disease Mother    Liver disease Father    Alcohol abuse Father    Cancer Father        hepatic cancer ?primary   Arthritis Sister    COPD Sister    SOCIAL HISTORY Social History   Tobacco Use   Smoking status: Former    Types: Cigarettes    Quit date: 07/05/1981    Years since quitting: 41.5   Smokeless tobacco: Never  Vaping Use   Vaping Use: Never used  Substance Use Topics   Alcohol use: Yes    Alcohol/week: 3.0 - 4.0 standard drinks of alcohol    Types: 3 - 4 Glasses of wine per week   Drug use: No       OPHTHALMIC EXAM:  Base Eye Exam     Visual Acuity (Snellen - Linear)       Right Left   Dist [place] 20/25 +1 20/20   Dist ph [place] 20/20          Tonometry (Tonopen, 9:34 AM)       Right Left   Pressure 07 11          Pupils       Dark Light Shape React APD   Right 2 1 Round Brisk None   Left 3 2 Round Minimal None  Visual Fields (Counting fingers)       Left Right    Full Full         Extraocular Movement       Right Left    Full, Ortho Full, Ortho         Neuro/Psych     Oriented x3: Yes   Mood/Affect: Normal         Dilation     Both eyes: 1.0% Mydriacyl, 2.5% Phenylephrine @ 9:34 AM           Slit Lamp and Fundus Exam     External Exam       Right Left   External  Periorbital Ecchymosis         Slit Lamp Exam       Right Left   Lids/Lashes Dermatochalasis - upper lid Dermatochalasis - upper lid   Conjunctiva/Sclera Trace nasal Pinguecula White and quiet   Cornea mild arcus, well healed cataract wound, trace PEE, well healed lasik flap 1+ Punctate epithelial erosions, nasal pterygeium, lasik flap in good position, EBMD, 1+ fine endo pigment   Anterior Chamber Deep and quiet Deep and quiet   Iris Round and dilated Round and moderately dilated, mild TIDs at 0730-0800, ?pseudoexfoliative material pupil margin   Lens PC IOL in good position 3 piece sulcus IOL in good position and stable   Anterior Vitreous Vitreous syneresis post vitrectomy, clear, lens remnants gone         Fundus Exam       Right Left   Disc Pink and Sharp, Compact Pink and Sharp, focal PPP   C/D Ratio 0.2 0.3   Macula Flat, good foveal reflex, Retinal pigment epithelial mottling, No heme or edema Flat, good foveal reflex, mild ERM, mild RPE changes, No heme or edema   Vessels mild attenuation, mild tortuosity, mild AV crossing changes mild attenuation, mild tortuosity, mild copper wiring, mild AV crossing changes   Periphery Attached, No heme Attached, pigmented CR atrophy at 1030 / 0130, good laser changes           IMAGING AND PROCEDURES  Imaging and Procedures for @TODAY @  OCT, Retina - OU - Both Eyes       Right Eye Quality was good. Central Foveal  Thickness: 293. Progression has been stable. Findings include normal foveal contour, no IRF, no SRF, epiretinal membrane, macular pucker (Mild ERM and pucker -- stable).   Left Eye Quality was good. Central Foveal Thickness: 321. Progression has been stable. Findings include normal foveal contour, no IRF, no SRF (Trace ERM -- stable).   Notes *Images captured and stored on drive  Diagnosis / Impression:  OD: Mild ERM and pucker -- stable OS: NFP, no IRF, no SRF- trace ERM -- stable   Clinical management:  See below  Abbreviations: NFP - Normal foveal profile. CME - cystoid macular edema. PED - pigment epithelial detachment. IRF - intraretinal fluid. SRF - subretinal fluid. EZ - ellipsoid zone. ERM - epiretinal membrane. ORA - outer retinal atrophy. ORT - outer retinal tubulation. SRHM - subretinal hyper-reflective material             ASSESSMENT/PLAN:    ICD-10-CM   1. Retained lens material following cataract surgery of left eye  H59.022     2. Ocular hypertension, left  H40.052     3. Epiretinal membrane (ERM) of both eyes  H35.373 OCT, Retina - OU - Both Eyes    4. Essential hypertension  I10  5. Hypertensive retinopathy of both eyes  H35.033     6. Combined forms of age-related cataract of right eye  H25.811     7. Pseudophakia  Z96.1     8. Monocular diplopia of left eye  H53.2       1. Retained Lens Material with ocular hypertension OS  - complicated CE + sulcus IOL OS (5.27.20, Shapiro) -- posterior capsule rupture and anterior vitrectomy  - sulcus IOL in excellent position -- stable without pseudophakodonesis; +capsular phimosis/fibrosis  - of note, per Dr. Wells Guiles report, pt had a history of pseudoexfoliation syndrome OS  - pre-op: persistent small cortical fragments in vitreous; small piece in inferior angle resolved  - tMax 42 OS on POD2 in Dr. Ashley Royalty office -- improved to 22 on [place]  - s/p PPV/EL/FAX OS, 08.27.20             - doing well --  BCVA 20/20!  - retained lens material gone             - IOP okay at 13  - pt is cleared from a retina standpoint for release to Dr. Dione Booze and resumption of primary eye care  - letter tio Dr. Dione Booze  2. Ocular Hypertension OS  - IOP 35 OS noted at Dr. Wells Guiles office (10.05.20)   - ?Durezol steroid response?   - of note, per Dr. Wells Guiles report, pt had a history of pseudoexfoliation syndrome OS  - Gonio 12.18.20 showed 3+ pigment in TM  - IOP 11 OS today  3. Epiretinal membrane OU  - mild ERM OU  - asymptomatic, no metamorphopsia  - no indication for surgery at this time  - f/u 1 year, DFE, OCT  4,5. Hypertensive retinopathy OU  - discussed importance of tight BP control  - monitor  7. Pseudophakia OU  - s/p CE + sulcus IOL (5.27.20, Dr. Nile Riggs) -- OS  - IOL in good position  - retained lens material as above  - OD Dr. Kathie Rhodes. [place] (07.28.23)  8. Monocular diplopia OS - stably resolved  - pt reported horizontal "shadowing" OS  - likely related to IOL + history of LASIK  - pt saw Dr. Delaney Meigs, but is now following with Dr. Dione Booze  Ophthalmic Meds Ordered this visit:  No orders of the defined types were placed in this encounter.    Return if symptoms worsen or fail to improve.  There are no Patient Instructions on file for this visit.  This document serves as a record of services personally performed by Karie Chimera, MD, PhD. It was created on their behalf by Berlin Hun COT, an ophthalmic technician. The creation of this record is the provider's dictation and/or activities during the visit.    Electronically signed by: Berlin Hun COT TODAY 6.14.2024 2:27 AM  This document serves as a record of services personally performed by Karie Chimera, MD, PhD. It was created on their behalf by Glee Arvin. Manson Passey, OA an ophthalmic technician. The creation of this record is the provider's dictation and/or activities during the visit.    Electronically signed by:  Glee Arvin. Manson Passey, [place] 06.21.2024 2:27 AM  Karie Chimera, M.D., Ph.D. Diseases & Surgery of the Retina and Vitreous [facility] 12/24/2022   I have reviewed the above documentation for accuracy and completeness, and I agree with the above. Karie Chimera, M.D., Ph.D. 12/25/22 2:29 AM  Abbreviations: M myopia (nearsighted); A astigmatism; H hyperopia (farsighted); P presbyopia; Mrx  spectacle prescription;  CTL contact lenses; OD right eye; OS left eye; OU both eyes  XT exotropia; ET esotropia; PEK punctate epithelial keratitis; PEE punctate epithelial erosions; DES dry eye syndrome; MGD meibomian gland dysfunction; ATs artificial tears; PFAT's preservative free artificial tears; NSC nuclear sclerotic cataract; PSC posterior subcapsular cataract; ERM epi-retinal membrane; PVD posterior vitreous detachment; RD retinal detachment; DM diabetes mellitus; DR diabetic retinopathy; NPDR non-proliferative diabetic retinopathy; PDR proliferative diabetic retinopathy; CSME clinically significant macular edema; DME diabetic macular edema; dbh dot blot hemorrhages; CWS cotton wool spot; POAG primary open angle glaucoma; C/D cup-to-disc ratio; HVF humphrey visual field; GVF goldmann visual field; OCT optical coherence tomography; IOP intraocular pressure; BRVO Branch retinal vein occlusion; CRVO central retinal vein occlusion; CRAO central retinal artery occlusion; BRAO branch retinal artery occlusion; RT retinal tear; SB scleral buckle; PPV pars plana vitrectomy; VH Vitreous hemorrhage; PRP panretinal laser photocoagulation; IVK intravitreal kenalog; VMT vitreomacular traction; MH Macular hole;  NVD neovascularization of the disc; NVE neovascularization elsewhere; AREDS age related eye disease study; ARMD age related macular degeneration; POAG primary open angle glaucoma; EBMD epithelial/anterior basement membrane dystrophy; ACIOL anterior chamber intraocular lens; IOL intraocular lens; PCIOL  posterior chamber intraocular lens; Phaco/IOL phacoemulsification with intraocular lens placement; PRK photorefractive keratectomy; LASIK laser assisted in situ keratomileusis; HTN hypertension; DM diabetes mellitus; COPD chronic obstructive pulmonary disease

## 2022-12-24 ENCOUNTER — Ambulatory Visit (INDEPENDENT_AMBULATORY_CARE_PROVIDER_SITE_OTHER): Payer: Medicare Other | Admitting: Ophthalmology

## 2022-12-24 ENCOUNTER — Encounter (INDEPENDENT_AMBULATORY_CARE_PROVIDER_SITE_OTHER): Payer: Self-pay | Admitting: Ophthalmology

## 2022-12-24 DIAGNOSIS — H532 Diplopia: Secondary | ICD-10-CM | POA: Diagnosis not present

## 2022-12-24 DIAGNOSIS — H25811 Combined forms of age-related cataract, right eye: Secondary | ICD-10-CM

## 2022-12-24 DIAGNOSIS — H40052 Ocular hypertension, left eye: Secondary | ICD-10-CM

## 2022-12-24 DIAGNOSIS — Z961 Presence of intraocular lens: Secondary | ICD-10-CM

## 2022-12-24 DIAGNOSIS — H35033 Hypertensive retinopathy, bilateral: Secondary | ICD-10-CM | POA: Diagnosis not present

## 2022-12-24 DIAGNOSIS — H59022 Cataract (lens) fragments in eye following cataract surgery, left eye: Secondary | ICD-10-CM

## 2022-12-24 DIAGNOSIS — I10 Essential (primary) hypertension: Secondary | ICD-10-CM

## 2022-12-24 DIAGNOSIS — H35373 Puckering of macula, bilateral: Secondary | ICD-10-CM

## 2022-12-25 ENCOUNTER — Encounter (INDEPENDENT_AMBULATORY_CARE_PROVIDER_SITE_OTHER): Payer: Self-pay | Admitting: Ophthalmology

## 2022-12-29 ENCOUNTER — Ambulatory Visit (INDEPENDENT_AMBULATORY_CARE_PROVIDER_SITE_OTHER): Payer: Medicare Other | Admitting: Family Medicine

## 2022-12-29 VITALS — BP 120/66 | HR 78 | Temp 97.6°F | Ht 69.0 in | Wt 164.8 lb

## 2022-12-29 DIAGNOSIS — Z0000 Encounter for general adult medical examination without abnormal findings: Secondary | ICD-10-CM | POA: Diagnosis not present

## 2022-12-29 NOTE — Progress Notes (Signed)
Established Patient Office Visit  Subjective   Patient ID: Ryan [name], male    DOB: 08-07-1945  Age: 77 y.o. MRN: [id number]  Chief Complaint  Patient presents with   Medicare Wellness         HPI   Ryan [name] is here for Medicare subsequent annual wellness visit.  Past medical history was reviewed.  He has history of hypertension, hypothyroidism, history of prostate cancer, bilateral cataracts with prior surgery, hyperlipidemia.  He has had several prostate biopsies in recent years and Gleason score had gone up by biopsy last December.  He has been scheduled for radiation seed implants July 29.  He is at peace with this decision.  He had cataract surgeries both eyes within the past year and vision is stable.  Chronic bilateral hearing loss and has hearing aids through the [place].  Has been followed by cardiology and currently on atorvastatin 20 mg daily.  He had coronary calcium score of 267 which is all left anterior descending back in February.  No recent chest pains.  Exercises fairly regularly as below  1.  Risk factors based on Past Medical , Social, and Family history reviewed and as indicated above with no changes  Past Medical History:  Diagnosis Date   Arthritis 03/28/2013   Back pain    Cancer (HCC) 2014   prostate cancer   Cataracts, bilateral    Dysrhythmia    occasional PAC- asymptomatic, per MD pt cut back on caffeine   ED (erectile dysfunction) 12/18/2021   due to arterial insufficency   Fungal nail infection    Hearing loss    wear hearing aids   HSV infection 2016   Hyperlipidemia    Hypertension    no meds, dx yrs ago per patient but normal last several yrs   Hypertensive retinopathy    OU   Hypothyroidism    Personal history of other diseases of the digestive system    Personal history of other diseases of the nervous system and sense organs 2014   Personal history of other endocrine, nutritional and metabolic disease 0865   Pneumonia    x 1    Primary testicular hypogonadism 2017   Retarded ejaculation 2021   Sleep apnea 2014   Past Surgical History:  Procedure Laterality Date   ankle surgery trauma Right    CATARACT EXTRACTION Left 11/29/2018   Dr. Nile Riggs   COLONOSCOPY     COSMETIC SURGERY     loose skin removed around neck area   eye lid surgery     EYE SURGERY     fungal nail     LASIK Bilateral    PARS PLANA VITRECTOMY Left 03/01/2019   Procedure: PARS PLANA VITRECTOMY WITH 25 GAUGE;  Surgeon: Rennis Chris, MD;  Location: [facility] OR;  Service: Ophthalmology;  Laterality: Left;   PROSTATE BIOPSY  06/21/2022   2020, 2018   REMOVAL RETAINED LENS Left 03/01/2019   Procedure: REMOVAL OF RETAINED LENS MATERIALS LEFT EYE;  Surgeon: Rennis Chris, MD;  Location: [facility] OR;  Service: Ophthalmology;  Laterality: Left;   VASECTOMY  1981   WISDOM TOOTH EXTRACTION      reports that he quit smoking about 41 years ago. His smoking use included cigarettes. He has never used smokeless tobacco. He reports current alcohol use of about 3.0 - 4.0 standard drinks of alcohol per week. He reports that he does not use drugs. family history includes Alcohol abuse in his father; Arthritis in his sister; COPD  in his sister; Cancer in his father; Heart disease in his mother; Liver disease in his father. Allergies  Allergen Reactions   Bee Venom     Other reaction(s): Drowsy   Dust Mite Extract     UNSPECIFIED REACTION    Grass Extracts [Gramineae Pollens]     UNSPECIFIED REACTION     2.  Limitations in physical activities None.  No recent falls.  Belongs to [facility] and exercises at least few days per week.  Usually does 20 minutes on elliptical followed by some weight exercises  3.  Depression/mood No active depression or anxiety issues PHQ 2 equals 0  4.  Hearing bilateral hearing aids through [place].  5.  ADLs independent in all.  6.  Cognitive function (orientation to time and place, language, writing, speech,memory) no short or long term memory  issues.  Language and judgement intact.    7.  Home Safety no issues-discussed fall risk within the home.  They have taken good measures to reduce falls already  8.  Height, weight, and visual acuity.all stable. Has had bilateral cataract surgery and vision improved since then.  Vision screen today was 20/20 left eye, 20/16 right eye, and 20/16 bilateral  Wt Readings from Last 3 Encounters:  12/29/22 164 lb 12.8 oz (74.8 kg)  12/16/22 170 lb (77.1 kg)  11/02/22 168 lb (76.2 kg)    9.  Counseling discussed -Counseled regarding age and gender appropriate preventative screenings and immunizations.  10. Recommendation of preventive services.  Continue annual flu vaccine.  Other vaccines up-to-date  11. Labs based on risk factors-none indicated at this time.  He gets fairly regular lab work through the [place] and PSA being monitored through urology.  12. Care Plan-as below  13. Other Providers-Dr. Theodoro Clock, cardiology Dr. Cardell Peach, urology   Dr. Marina Goodell, GI  14. Written schedule of screening/prevention services given to patient.  Health Maintenance  Topic Date Due   COVID-19 Vaccine (3 - Moderna risk series) 06/14/2020   Medicare Annual Wellness (AWV)  11/19/2021   INFLUENZA VACCINE  02/03/2023   DTaP/Tdap/Td (5 - Td or Tdap) 09/03/2028   Colonoscopy  10/07/2032   Pneumonia Vaccine 63+ Years old  Completed   Hepatitis C Screening  Completed   Zoster Vaccines- Shingrix  Completed   HPV VACCINES  Aged Out     Review of Systems  Constitutional:  Negative for malaise/fatigue.  Eyes:  Negative for blurred vision.  Respiratory:  Negative for shortness of breath.   Cardiovascular:  Negative for chest pain.  Gastrointestinal:  Negative for abdominal pain.  Neurological:  Negative for dizziness, weakness and headaches.      Objective:     BP 120/66 (BP Location: Left Arm, Patient Position: Sitting, Cuff Size: Normal)   Pulse 78   Temp 97.6 F (36.4 C) (Oral)   Ht 5\' 9"  (1.753 m)   Wt  164 lb 12.8 oz (74.8 kg)   SpO2 98%   BMI 24.34 kg/m  BP Readings from Last 3 Encounters:  12/29/22 120/66  11/02/22 132/66  10/08/22 126/66   Wt Readings from Last 3 Encounters:  12/29/22 164 lb 12.8 oz (74.8 kg)  12/16/22 170 lb (77.1 kg)  11/02/22 168 lb (76.2 kg)      Physical Exam Vitals reviewed.  Constitutional:      Appearance: He is well-developed.  Eyes:     Pupils: Pupils are equal, round, and reactive to light.  Neck:     Thyroid: No thyromegaly.  Cardiovascular:  Rate and Rhythm: Normal rate and regular rhythm.  Pulmonary:     Effort: Pulmonary effort is normal. No respiratory distress.     Breath sounds: Normal breath sounds. No wheezing or rales.  Musculoskeletal:     Cervical back: Neck supple.     Right lower leg: No edema.     Left lower leg: No edema.  Neurological:     Mental Status: He is alert and oriented to person, place, and time.      No results found for any visits on 12/29/22.  Last CBC Lab Results  Component Value Date   WBC 5.4 08/22/2020   HGB 16.6 08/22/2020   HCT 49 08/22/2020   MCV 88.7 03/01/2019   MCH 29.5 03/01/2019   RDW 14.3 03/01/2019   PLT 224 08/22/2020   Last metabolic panel Lab Results  Component Value Date   GLUCOSE 94 03/01/2019   NA 135 (A) 08/22/2020   K 4.2 08/22/2020   CL 105 08/22/2020   CO2 27 (A) 08/22/2020   BUN 12 08/22/2020   CREATININE 1.0 08/22/2020   GFRNONAA >60 03/01/2019   CALCIUM 9.8 08/22/2020   PROT 6.8 11/04/2022   ALBUMIN 4.6 11/04/2022   BILITOT 0.7 11/04/2022   ALKPHOS 89 11/04/2022   AST 31 11/04/2022   ALT 27 11/04/2022   ANIONGAP 10 03/01/2019   Last lipids Lab Results  Component Value Date   CHOL 197 11/04/2022   HDL 68 11/04/2022   LDLCALC 110 (H) 11/04/2022   LDLDIRECT 122.5 07/02/2013   TRIG 107 11/04/2022   CHOLHDL 2.9 11/04/2022      The 10-year ASCVD risk score (Arnett DK, et al., 2019) is: 25.1%    Assessment & Plan:   Patient here for Medicare  subsequent annual wellness visit.  Recently diagnosed with prostate cancer.  Has pending radiation seed implants. -No labs due at this time -Continue annual flu vaccine -No other immunizations due at this time -Colonoscopy up-to-date -Continue exercise with recommended minimum of 150 minutes/week of moderate intensity exercise  Evelena Peat, MD

## 2023-01-04 ENCOUNTER — Other Ambulatory Visit: Payer: Self-pay | Admitting: Internal Medicine

## 2023-01-10 ENCOUNTER — Encounter (HOSPITAL_BASED_OUTPATIENT_CLINIC_OR_DEPARTMENT_OTHER): Payer: Self-pay | Admitting: Urology

## 2023-01-10 DIAGNOSIS — N390 Urinary tract infection, site not specified: Secondary | ICD-10-CM | POA: Diagnosis not present

## 2023-01-10 DIAGNOSIS — R972 Elevated prostate specific antigen [PSA]: Secondary | ICD-10-CM | POA: Diagnosis not present

## 2023-01-10 DIAGNOSIS — C61 Malignant neoplasm of prostate: Secondary | ICD-10-CM | POA: Diagnosis not present

## 2023-01-10 NOTE — Progress Notes (Signed)
Spoke w/ via phone for pre-op interview--- Ryan [name] and wife [name] Lab needs dos----   EKG  per anesthesia          Lab results------ COVID test -----patient states asymptomatic no test needed Arrive at -------0730 NPO after MN NO Solid Food.  Clear liquids from MN until---0630 Med rec completed Medications to take morning of surgery -----Synthroid, Metoprolol, Protonix, Cytomel Diabetic medication ----- Patient instructed no nail polish to be worn day of surgery Patient instructed to bring photo id and insurance card day of surgery Patient aware to have Driver (ride ) / caregiver Wife Gregroy Lahaie   for 24 hours after surgery  Patient Special Instructions -----FLEETS enema prior to procedure,pt verbalized understanding. Pre-Op special Instructions ----- Patient verbalized understanding of instructions that were given at this phone interview. Patient denies shortness of breath, chest pain, fever, cough at this phone interview.

## 2023-01-26 ENCOUNTER — Encounter: Payer: Self-pay | Admitting: Cardiovascular Disease

## 2023-01-28 ENCOUNTER — Telehealth: Payer: Self-pay | Admitting: *Deleted

## 2023-01-28 NOTE — Telephone Encounter (Signed)
xxxx 

## 2023-01-28 NOTE — Telephone Encounter (Signed)
Called patient to remind of procedure for 01-31-23, spoke with patient and he is aware of this procedure

## 2023-01-30 NOTE — Progress Notes (Signed)
  Radiation Oncology         [phone] ________________________________  Name: Ryan [name] MRN: [id number]  Date: 01/31/2023  DOB: June 28, 1946       Prostate Seed Implant  GU:[id number], Elberta Fortis, MD  No ref. provider found  DIAGNOSIS:   77 y.o. gentleman with Stage T1c adenocarcinoma of the prostate with Gleason score of 3+4, and PSA of 2.91.   Oncology History  Prostate cancer (HCC)  02/14/2014 Initial Diagnosis   Prostate cancer (HCC)   06/21/2022 Cancer Staging   Staging form: Prostate, AJCC 8th Edition - Clinical stage from 06/21/2022: Stage IIB (cT1c, cN0, cM0, PSA: 2.4, Grade Group: 2) - Signed by Marcello Fennel, PA-C on 08/03/2022 Histopathologic type: Adenocarcinoma, NOS Stage prefix: Initial diagnosis Prostate specific antigen (PSA) range: Less than 10 Gleason primary pattern: 3 Gleason secondary pattern: 4 Gleason score: 7 Histologic grading system: 5 grade system Number of biopsy cores examined: 20 Number of biopsy cores positive: 5 Location of positive needle core biopsies: Both sides       ICD-10-CM   1. Essential hypertension  I10 EKG 12 lead per protocol      PROCEDURE: Insertion of radioactive I-125 seeds into the prostate gland.  RADIATION DOSE: 145 Gy, definitive therapy.  TECHNIQUE: Ryan [name] was brought to the operating room with the urologist. He was placed in the dorsolithotomy position. He was catheterized and a rectal tube was inserted. The perineum was shaved, prepped and draped. The ultrasound probe was then introduced by me into the rectum to see the prostate gland.  TREATMENT DEVICE: I attached the needle grid to the ultrasound probe stand and anchor needles were placed.  3D PLANNING: The prostate was imaged in 3D using a sagittal sweep of the prostate probe. These images were transferred to the planning computer. There, the prostate, urethra and rectum were defined on each axial reconstructed image. Then, the software created an  optimized 3D plan and a few seed positions were adjusted. The quality of the plan was reviewed using [facility] information for the target and the following two organs at risk:  Urethra and Rectum.  Then the accepted plan was printed and handed off to the radiation therapist.  Under my supervision, the custom loading of the seeds and spacers was carried out using the quick loader.  These pre-loaded needles were then placed into the needle holder.Marland Kitchen  PROSTATE VOLUME STUDY:  Using transrectal ultrasound the volume of the prostate was verified to be 42 cc.  SPECIAL TREATMENT PROCEDURE/SUPERVISION AND HANDLING: The pre-loaded needles were then delivered by the urologist under sagittal guidance. A total of 18 needles were used to deposit 81 seeds in the prostate gland. The individual seed activity was 0.376 mCi.  SpaceOAR:  Yes  COMPLEX SIMULATION: At the end of the procedure, an anterior radiograph of the pelvis was obtained to document seed positioning and count. Cystoscopy was performed by the urologist to check the urethra and bladder.  MICRODOSIMETRY: At the end of the procedure, the patient was emitting 0.15 mR/hr at 1 meter. Accordingly, he was considered safe for hospital discharge.  PLAN: The patient will return to the radiation oncology clinic for post implant CT dosimetry in three weeks.   ________________________________  Artist Pais Kathrynn Running, M.D.

## 2023-01-31 ENCOUNTER — Ambulatory Visit (HOSPITAL_BASED_OUTPATIENT_CLINIC_OR_DEPARTMENT_OTHER)
Admission: RE | Admit: 2023-01-31 | Discharge: 2023-01-31 | Disposition: A | Discharge status: Home / Self Care | Payer: Medicare Other | Attending: Urology | Admitting: Urology

## 2023-01-31 ENCOUNTER — Ambulatory Visit (HOSPITAL_BASED_OUTPATIENT_CLINIC_OR_DEPARTMENT_OTHER): Payer: Medicare Other | Admitting: Anesthesiology

## 2023-01-31 ENCOUNTER — Encounter (HOSPITAL_BASED_OUTPATIENT_CLINIC_OR_DEPARTMENT_OTHER): Payer: Self-pay | Admitting: Urology

## 2023-01-31 ENCOUNTER — Encounter (HOSPITAL_BASED_OUTPATIENT_CLINIC_OR_DEPARTMENT_OTHER)
Admission: RE | Disposition: A | Discharge status: Home / Self Care | Payer: Self-pay | Source: Home / Self Care | Attending: Urology

## 2023-01-31 ENCOUNTER — Other Ambulatory Visit: Payer: Self-pay

## 2023-01-31 ENCOUNTER — Ambulatory Visit (HOSPITAL_COMMUNITY): Payer: Medicare Other

## 2023-01-31 DIAGNOSIS — E039 Hypothyroidism, unspecified: Secondary | ICD-10-CM | POA: Insufficient documentation

## 2023-01-31 DIAGNOSIS — K219 Gastro-esophageal reflux disease without esophagitis: Secondary | ICD-10-CM | POA: Diagnosis not present

## 2023-01-31 DIAGNOSIS — Z87891 Personal history of nicotine dependence: Secondary | ICD-10-CM | POA: Insufficient documentation

## 2023-01-31 DIAGNOSIS — Z79899 Other long term (current) drug therapy: Secondary | ICD-10-CM | POA: Insufficient documentation

## 2023-01-31 DIAGNOSIS — C61 Malignant neoplasm of prostate: Secondary | ICD-10-CM

## 2023-01-31 DIAGNOSIS — N529 Male erectile dysfunction, unspecified: Secondary | ICD-10-CM | POA: Diagnosis not present

## 2023-01-31 DIAGNOSIS — I10 Essential (primary) hypertension: Secondary | ICD-10-CM | POA: Insufficient documentation

## 2023-01-31 DIAGNOSIS — Z191 Hormone sensitive malignancy status: Secondary | ICD-10-CM | POA: Diagnosis not present

## 2023-01-31 HISTORY — PX: CYSTOSCOPY: SHX5120

## 2023-01-31 HISTORY — PX: SPACE OAR INSTILLATION: SHX6769

## 2023-01-31 HISTORY — DX: Gastro-esophageal reflux disease without esophagitis: K21.9

## 2023-01-31 HISTORY — PX: RADIOACTIVE SEED IMPLANT: SHX5150

## 2023-01-31 SURGERY — INSERTION, RADIATION SOURCE, PROSTATE
Anesthesia: General

## 2023-01-31 MED ORDER — MIDAZOLAM HCL 5 MG/5ML IJ SOLN
INTRAMUSCULAR | Status: DC | PRN
  Administered 2023-01-31: 1 mg via INTRAVENOUS

## 2023-01-31 MED ORDER — CIPROFLOXACIN IN D5W 400 MG/200ML IV SOLN
INTRAVENOUS | Status: AC
  Filled 2023-01-31: qty 200

## 2023-01-31 MED ORDER — FLEET ENEMA 7-19 GM/118ML RE ENEM: 1.0000 | ENEMA | Freq: Once | RECTAL | Status: DC

## 2023-01-31 MED ORDER — STERILE WATER FOR IRRIGATION IR SOLN
Status: DC | PRN
  Administered 2023-01-31: 500 mL

## 2023-01-31 MED ORDER — LACTATED RINGERS IV SOLN: INTRAVENOUS | Status: DC

## 2023-01-31 MED ORDER — CIPROFLOXACIN IN D5W 400 MG/200ML IV SOLN
400.0000 mg | INTRAVENOUS | Status: AC
  Administered 2023-01-31: 400 mg via INTRAVENOUS

## 2023-01-31 MED ORDER — IOHEXOL 300 MG/ML  SOLN
INTRAMUSCULAR | Status: DC | PRN
  Administered 2023-01-31: 7 mL

## 2023-01-31 MED ORDER — ARTIFICIAL TEARS OPHTHALMIC OINT
TOPICAL_OINTMENT | OPHTHALMIC | Status: AC
  Filled 2023-01-31: qty 3.5

## 2023-01-31 MED ORDER — DEXAMETHASONE SODIUM PHOSPHATE 10 MG/ML IJ SOLN
INTRAMUSCULAR | Status: DC | PRN
  Administered 2023-01-31: 4 mg via INTRAVENOUS

## 2023-01-31 MED ORDER — SODIUM CHLORIDE (PF) 0.9 % IJ SOLN
INTRAMUSCULAR | Status: DC | PRN
  Administered 2023-01-31: 10 mL via INTRAVENOUS

## 2023-01-31 MED ORDER — DEXAMETHASONE SODIUM PHOSPHATE 10 MG/ML IJ SOLN
INTRAMUSCULAR | Status: AC
  Filled 2023-01-31: qty 1

## 2023-01-31 MED ORDER — LIDOCAINE HCL (PF) 2 % IJ SOLN
INTRAMUSCULAR | Status: AC
  Filled 2023-01-31: qty 5

## 2023-01-31 MED ORDER — FENTANYL CITRATE (PF) 100 MCG/2ML IJ SOLN
25.0000 ug | INTRAMUSCULAR | Status: DC | PRN
  Administered 2023-01-31 (×2): 50 ug via INTRAVENOUS

## 2023-01-31 MED ORDER — KETOROLAC TROMETHAMINE 30 MG/ML IJ SOLN
INTRAMUSCULAR | Status: AC
  Filled 2023-01-31: qty 1

## 2023-01-31 MED ORDER — OXYCODONE-ACETAMINOPHEN 5-325 MG PO TABS: 1.0000 | ORAL_TABLET | ORAL | 0 refills | Status: DC | PRN

## 2023-01-31 MED ORDER — LIDOCAINE 2% (20 MG/ML) 5 ML SYRINGE
INTRAMUSCULAR | Status: DC | PRN
  Administered 2023-01-31: 60 mg via INTRAVENOUS

## 2023-01-31 MED ORDER — SUGAMMADEX SODIUM 200 MG/2ML IV SOLN
INTRAVENOUS | Status: DC | PRN
  Administered 2023-01-31: 150 mg via INTRAVENOUS

## 2023-01-31 MED ORDER — FENTANYL CITRATE (PF) 100 MCG/2ML IJ SOLN
INTRAMUSCULAR | Status: DC | PRN
  Administered 2023-01-31: 100 ug via INTRAVENOUS

## 2023-01-31 MED ORDER — ROCURONIUM BROMIDE 10 MG/ML (PF) SYRINGE
PREFILLED_SYRINGE | INTRAVENOUS | Status: DC | PRN
  Administered 2023-01-31: 45 mg via INTRAVENOUS

## 2023-01-31 MED ORDER — SODIUM CHLORIDE 0.9 % IR SOLN
Status: DC | PRN
  Administered 2023-01-31: 1000 mL via INTRAVESICAL

## 2023-01-31 MED ORDER — DOCUSATE SODIUM 100 MG PO CAPS
100.0000 mg | ORAL_CAPSULE | Freq: Every day | ORAL | 0 refills | Status: DC | PRN
Start: 2023-01-31 — End: 2023-08-03

## 2023-01-31 MED ORDER — ACETAMINOPHEN 500 MG PO TABS
1000.0000 mg | ORAL_TABLET | Freq: Once | ORAL | Status: AC
  Administered 2023-01-31: 1000 mg via ORAL

## 2023-01-31 MED ORDER — KETOROLAC TROMETHAMINE 30 MG/ML IJ SOLN
INTRAMUSCULAR | Status: DC | PRN
  Administered 2023-01-31: 15 mg via INTRAVENOUS

## 2023-01-31 MED ORDER — ONDANSETRON HCL 4 MG/2ML IJ SOLN
INTRAMUSCULAR | Status: DC | PRN
  Administered 2023-01-31: 4 mg via INTRAVENOUS

## 2023-01-31 MED ORDER — ACETAMINOPHEN 500 MG PO TABS
ORAL_TABLET | ORAL | Status: AC
  Filled 2023-01-31: qty 2

## 2023-01-31 MED ORDER — ONDANSETRON HCL 4 MG/2ML IJ SOLN
INTRAMUSCULAR | Status: AC
  Filled 2023-01-31: qty 2

## 2023-01-31 MED ORDER — FENTANYL CITRATE (PF) 100 MCG/2ML IJ SOLN
INTRAMUSCULAR | Status: AC
  Filled 2023-01-31: qty 2

## 2023-01-31 MED ORDER — PROPOFOL 10 MG/ML IV BOLUS
INTRAVENOUS | Status: DC | PRN
Start: 2023-01-31 — End: 2023-01-31
  Administered 2023-01-31: 125 mg via INTRAVENOUS

## 2023-01-31 MED ORDER — MIDAZOLAM HCL 2 MG/2ML IJ SOLN
INTRAMUSCULAR | Status: AC
  Filled 2023-01-31: qty 2

## 2023-01-31 MED ORDER — ROCURONIUM BROMIDE 10 MG/ML (PF) SYRINGE
PREFILLED_SYRINGE | INTRAVENOUS | Status: AC
  Filled 2023-01-31: qty 10

## 2023-01-31 SURGICAL SUPPLY — 45 items
BAG DRN RND TRDRP ANRFLXCHMBR (UROLOGICAL SUPPLIES) ×1
BAG URINE DRAIN 2000ML AR STRL (UROLOGICAL SUPPLIES) ×1 IMPLANT
BLADE CLIPPER SENSICLIP SURGIC (BLADE) ×1 IMPLANT
Bard Quicklink seeds IMPLANT
CATH FOLEY 2WAY SLVR 5CC 16FR (CATHETERS) ×1 IMPLANT
CATH ROBINSON RED A/P 16FR (CATHETERS) IMPLANT
CATH ROBINSON RED A/P 20FR (CATHETERS) ×1 IMPLANT
CLOTH BEACON ORANGE TIMEOUT ST (SAFETY) ×1 IMPLANT
CNTNR URN SCR LID CUP LEK RST (MISCELLANEOUS) ×1 IMPLANT
CONT SPEC 4OZ STRL OR WHT (MISCELLANEOUS) ×1
COVER BACK TABLE 60X90IN (DRAPES) ×1 IMPLANT
COVER MAYO STAND STRL (DRAPES) ×1 IMPLANT
DRSG TEGADERM 4X4.75 (GAUZE/BANDAGES/DRESSINGS) ×1 IMPLANT
DRSG TEGADERM 8X12 (GAUZE/BANDAGES/DRESSINGS) ×1 IMPLANT
GAUZE SPONGE 4X4 3PLY NS LF (GAUZE/BANDAGES/DRESSINGS) IMPLANT
GEL ULTRASOUND 20GR AQUASONIC (MISCELLANEOUS) ×2 IMPLANT
GLOVE BIO SURGEON STRL SZ 6.5 (GLOVE) IMPLANT
GLOVE BIO SURGEON STRL SZ7 (GLOVE) ×1 IMPLANT
GLOVE BIO SURGEON STRL SZ7.5 (GLOVE) ×1 IMPLANT
GLOVE BIO SURGEON STRL SZ8 (GLOVE) IMPLANT
GLOVE BIOGEL PI IND STRL 6.5 (GLOVE) IMPLANT
GLOVE SURG ORTHO 8.5 STRL (GLOVE) ×1 IMPLANT
GOWN STRL REUS W/TWL LRG LVL3 (GOWN DISPOSABLE) ×1 IMPLANT
GRID BRACH TEMP 18GA 2.8X3X.75 (MISCELLANEOUS) ×1 IMPLANT
HOLDER FOLEY CATH W/STRAP (MISCELLANEOUS) IMPLANT
IMPL SPACEOAR VUE SYSTEM (Spacer) ×1 IMPLANT
IMPLANT SPACEOAR VUE SYSTEM (Spacer) ×1 IMPLANT
IV NS 1000ML (IV SOLUTION) ×1
IV NS 1000ML BAXH (IV SOLUTION) ×1 IMPLANT
KIT TURNOVER CYSTO (KITS) ×1 IMPLANT
NDL BRACHY 18G 5PK (NEEDLE) ×4 IMPLANT
NDL BRACHY 18G SINGLE (NEEDLE) IMPLANT
NDL PK MORGANSTERN STABILIZ (NEEDLE) ×1 IMPLANT
NEEDLE BRACHY 18G 5PK (NEEDLE) ×4 IMPLANT
NEEDLE BRACHY 18G SINGLE (NEEDLE) IMPLANT
NEEDLE PK MORGANSTERN STABILIZ (NEEDLE) ×1 IMPLANT
PACK CYSTO (CUSTOM PROCEDURE TRAY) ×1 IMPLANT
SHEATH ULTRASOUND LF (SHEATH) IMPLANT
SHEATH ULTRASOUND LTX NONSTRL (SHEATH) IMPLANT
SLEEVE SCD COMPRESS KNEE MED (STOCKING) ×1 IMPLANT
SUT BONE WAX W31G (SUTURE) IMPLANT
SYR 10ML LL (SYRINGE) ×1 IMPLANT
TOWEL OR 17X24 6PK STRL BLUE (TOWEL DISPOSABLE) ×1 IMPLANT
UNDERPAD 30X36 HEAVY ABSORB (UNDERPADS AND DIAPERS) ×2 IMPLANT
WATER STERILE IRR 500ML POUR (IV SOLUTION) ×1 IMPLANT

## 2023-01-31 NOTE — Anesthesia Procedure Notes (Addendum)
Procedure Name: Intubation Date/Time: 01/31/2023 9:43 AM  Performed by: Briant Sites, CRNAPre-anesthesia Checklist: Patient identified, Emergency Drugs available, Suction available and Patient being monitored Patient Re-evaluated:Patient Re-evaluated prior to induction Oxygen Delivery Method: Circle system utilized Preoxygenation: Pre-oxygenation with 100% oxygen Induction Type: IV induction Ventilation: Mask ventilation without difficulty Laryngoscope Size: Mac and 4 Grade View: Grade I Tube type: Oral Tube size: 8.0 mm Number of attempts: 1 Airway Equipment and Method: Stylet Placement Confirmation: ETT inserted through vocal cords under direct vision, positive ETCO2 and breath sounds checked- equal and bilateral Secured at: 22 cm Tube secured with: Tape Dental Injury: Teeth and Oropharynx as per pre-operative assessment

## 2023-01-31 NOTE — Anesthesia Preprocedure Evaluation (Addendum)
Anesthesia Evaluation  Patient identified by MRN, date of birth, ID band Patient awake    Reviewed: Allergy & Precautions, H&P , NPO status , Patient's Chart, lab work & pertinent test results, reviewed documented beta blocker date and time   Airway Mallampati: II  TM Distance: >3 FB Neck ROM: Full    Dental no notable dental hx. (+) Teeth Intact, Dental Advisory Given   Pulmonary former smoker   Pulmonary exam normal breath sounds clear to auscultation       Cardiovascular hypertension, Pt. on medications and Pt. on home beta blockers + dysrhythmias  Rhythm:Regular Rate:Normal     Neuro/Psych negative neurological ROS  negative psych ROS   GI/Hepatic Neg liver ROS,GERD  Medicated,,  Endo/Other  Hypothyroidism    Renal/GU negative Renal ROS  negative genitourinary   Musculoskeletal  (+) Arthritis , Osteoarthritis,    Abdominal   Peds  Hematology negative hematology ROS (+)   Anesthesia Other Findings   Reproductive/Obstetrics negative OB ROS                             Anesthesia Physical Anesthesia Plan  ASA: 2  Anesthesia Plan: General   Post-op Pain Management: Tylenol PO (pre-op)*   Induction: Intravenous  PONV Risk Score and Plan: 3 and Ondansetron and Dexamethasone  Airway Management Planned: Oral ETT and LMA  Additional Equipment:   Intra-op Plan:   Post-operative Plan: Extubation in OR  Informed Consent: I have reviewed the patients History and Physical, chart, labs and discussed the procedure including the risks, benefits and alternatives for the proposed anesthesia with the patient or authorized representative who has indicated his/her understanding and acceptance.     Dental advisory given  Plan Discussed with: CRNA  Anesthesia Plan Comments:        Anesthesia Quick Evaluation

## 2023-01-31 NOTE — Anesthesia Postprocedure Evaluation (Signed)
Anesthesia Post Note  Patient: Ryan [name]  Procedure(s) Performed: RADIOACTIVE SEED IMPLANT/BRACHYTHERAPY IMPLANT SPACE OAR INSTILLATION CYSTOSCOPY     Patient location during evaluation: PACU Anesthesia Type: General Level of consciousness: awake and alert Pain management: pain level controlled Vital Signs Assessment: post-procedure vital signs reviewed and stable Respiratory status: spontaneous breathing, nonlabored ventilation and respiratory function stable Cardiovascular status: blood pressure returned to baseline and stable Postop Assessment: no apparent nausea or vomiting Anesthetic complications: no  No notable events documented.  Last Vitals:  Vitals:   01/31/23 1136 01/31/23 1215  BP:  121/65  Pulse: (!) 59 (!) 53  Resp: 18 18  Temp:  36.7 C  SpO2: 98% 97%    Last Pain:  Vitals:   01/31/23 1215  TempSrc:   PainSc: 2                  Jaydrien Wassenaar,W. EDMOND

## 2023-01-31 NOTE — Discharge Instructions (Addendum)
Activity:  You are encouraged to ambulate frequently (about every hour during waking hours) to help prevent blood clots from forming in your legs or lungs.    Diet: You should advance your diet as instructed by your physician.  It will be normal to have some bloating, nausea, and abdominal discomfort intermittently.  Prescriptions:  You will be provided a prescription for pain medication to take as needed.  If your pain is not severe enough to require the prescription pain medication, you may take extra strength Tylenol instead which will have less side effects.  You should also take a prescribed stool softener to avoid straining with bowel movements as the prescription pain medication may constipate you.  What to call us about: You should call the office ([phone]) if you develop fever > 101 or develop persistent vomiting. Activity:  You are encouraged to ambulate frequently (about every hour during waking hours) to help prevent blood clots from forming in your legs or lungs.   Post Anesthesia Home Care Instructions  Activity: Get plenty of rest for the remainder of the day. A responsible adult should stay with you for 24 hours following the procedure.  For the next 24 hours, DO NOT: -Drive a car -Operate machinery -Drink alcoholic beverages -Take any medication unless instructed by your physician -Make any legal decisions or sign important papers.  Meals: Start with liquid foods such as gelatin or soup. Progress to regular foods as tolerated. Avoid greasy, spicy, heavy foods. If nausea and/or vomiting occur, drink only clear liquids until the nausea and/or vomiting subsides. Call your physician if vomiting continues.  Special Instructions/Symptoms: Your throat may feel dry or sore from the anesthesia or the breathing tube placed in your throat during surgery. If this causes discomfort, gargle with warm salt water. The discomfort should disappear within 24 hours.     

## 2023-01-31 NOTE — Transfer of Care (Signed)
Immediate Anesthesia Transfer of Care Note  Patient: Ryan [name]  Procedure(s) Performed: RADIOACTIVE SEED IMPLANT/BRACHYTHERAPY IMPLANT SPACE OAR INSTILLATION CYSTOSCOPY  Patient Location: PACU  Anesthesia Type:General  Level of Consciousness: drowsy  Airway & Oxygen Therapy: Patient Spontanous Breathing and Patient connected to nasal cannula oxygen  Post-op Assessment: Report given to RN and Post -op Vital signs reviewed and stable  Post vital signs: Reviewed  Last Vitals:  Vitals Value Taken Time  BP 149/69   Temp    Pulse 61 01/31/23 1057  Resp 17 01/31/23 1057  SpO2 98 % 01/31/23 1057  Vitals shown include unfiled device data.  Last Pain:  Vitals:   01/31/23 0750  TempSrc: Oral  PainSc: 4       Patients Stated Pain Goal: 5 (01/31/23 0750)  Complications: No notable events documented.

## 2023-01-31 NOTE — Op Note (Signed)
PATIENT:  Ryan [name]  PRE-OPERATIVE DIAGNOSIS:  Adenocarcinoma of the prostate  POST-OPERATIVE DIAGNOSIS:  Same  PROCEDURE:  1. I-125 radioactive seed implantation 2. Cystoscopy  3. Placement of SpaceOAR  SURGEON:  Jettie Pagan, MD  Radiation oncologist: Margaretmary Dys, MD  ANESTHESIA:  General  EBL:  Minimal  DRAINS: None  INDICATION: [name]  Description of procedure: After informed consent the patient was brought to the major OR, placed on the table and administered general anesthesia. He was then moved to the modified lithotomy position with his perineum perpendicular to the floor. His perineum and genitalia were then sterilely prepped. An official timeout was then performed. A 16 French Foley catheter was then placed in the bladder and filled with dilute contrast, a rectal tube was placed in the rectum and the transrectal ultrasound probe was placed in the rectum and affixed to the stand. He was then sterilely draped.  Real time ultrasonography was used along with the seed planning software. This was used to develop the seed plan including the number of needles as well as number of seeds required for complete and adequate coverage. Real-time ultrasonography was then used along with the previously developed plan and the Nucletron device to implant a total of 81 seeds using 18 needles. This proceeded without difficulty or complication.   I then proceeded with placement of SpaceOAR by introducing a needle with the bevel angled inferiorly approximately 2 cm superior to the anus. This was angled downward and under direct ultrasound was placed within the space between the prostatic capsule and rectum. This was confirmed with a small amount of sterile saline injected and this was performed under direct ultrasound. I then attached the SpaceOAR to the needle and injected this in the space between the prostate and rectum with good placement noted.  A Foley catheter was then  removed as well as the transrectal ultrasound probe and rectal probe. Flexible cystoscopy was then performed using the 16 French flexible scope which revealed a normal urethra throughout its length down to the sphincter which appeared intact. The prostatic urethra revealed bilobar hypertrophy but no evidence of obstruction, seeds, spacers or lesions. The bladder was then entered and fully and systematically inspected. The ureteral orifices were noted to be of normal configuration and position. The mucosa revealed no evidence of tumors. There were also no stones identified within the bladder. I noted no seeds or spacers on the floor of the bladder and retroflexion of the scope revealed no seeds protruding from the base of the prostate.  The cystoscope was then removed and the patient was awakened and taken to recovery room in stable and satisfactory condition. He tolerated procedure well and there were no intraoperative complications.  Matt R. Timea Breed MD [facility]  Pager: [phone]

## 2023-01-31 NOTE — H&P (Signed)
Office Visit Report     01/10/2023   --------------------------------------------------------------------------------   Ryan [name]  MRN: [id number]  DOB: [birth year], 77 year old Male  SSN: [id number]   PRIMARY CARE:  Ryan Peat, MD  PRIMARY CARE FAX:  [phone]  REFERRING:  Ryan Hick, MD  PROVIDER:  Jettie [name], M.D.  TREATING:  Ryan Han, PA-C  LOCATION:  [facility] [phone]     --------------------------------------------------------------------------------   CC/HPI: Initial TRUS/Bx for an elevated PSA (4.14) with a positive PCA 3 test on 10.27.2014. Prostatic volume was 33.11 mL. PSAD 0.13. 4/12 cores returned adenocarcinoma, all GS 3+3 as follows:  Left base lateral, less than 5% of core  Left mid lateral, less than 5% of core with high-grade PIN  Right base medial, 5% of core  Right mid medial, 5% of core  High-grade PIN was found in the right apex.  He initially decided on active surveillance.  MRI performed on 2.18.2015 showed 3 areas of concern-there was a small nodule, 7 mm, in the right lateral base with restricted diffusion, suspicious for small focus of high-grade carcinoma. In the right apex, there was a focus of decreased intensity, consistent with either prostatitis or low-grade carcinoma. In the left lateral and anterior face, there was an area of perhaps 7 x 14 mm with restricted diffusion which could represent a focus of high-grade carcinoma.  Repeat/surveillance biopsy was performed on 3.15.2015.  2 cores were positive--1 each from the left base and the right base showed GS 3+3 pattern. We decided to stay on surveillance.  Repeat TRUS/Bx 7.1.2016.  4/12 cores positive--these were all GS 3+3--  right mid lateral, right base medial, left base lateral, left base medial.  Transposing his biopsy results, all 6 positive biopsies were in the base and mid prostate bilaterally. None were involving the apex. They were all GS 3+3    Biopsied tissue was sent for Oncotype DX, which corresponded with low risk prostate cancer. His GPS result was 24--likelihood of favorable pathology 76%. Likelihood of low-grade disease 86%. Likelihood of organ confined disease 83%.   7.31.2018: MRI of his prostate. This revealed no evidence of macroscopic high-grade disease.   8.22.2018: surveillance TRUS/Bx. Prostatic volume was 36.42 mL. PSA 2.73. PSA density 0.07. 3/12 cores revealed GS 3+3 pattern--positive cores were at the right base lateral, right base medial, left base lateral with 5, 20 and 30% of the cores involved with adenocarcinoma, respectively.   3.11.2020: PSA 3.29   8.12.2020: MRI prostate--no suspicious lesions. Prostate volume 40.73 ml.   8.19.2020: Surveillance TRUS/Bx. Prostate volume 30 ml. 2/12 cores revealed PCa--rt base medial, lt mid lateral--both revealing GS 3+3 pattern in < 5 and 10% of cores, respectively.   4.21.2021: PSA 2.82.  12.3.2021: PSA 4.12  6.8.2022: PSA 2.81.  12.14.2022: PSA 2.59.   6.16.2023: PSA 2.42.   12.4.2023: MRI prostate.  No evident extra prostatic abnormalities. Prostate volume 33.8 mL.  ROI 1-PI-RADS 4 lesion of left posterior medial/posterior lateral peripheral zone at the base and mid gland. 10 x 4 x 7 mm  ROI 2-PI-RADS 3 lesion of the left anterior peripheral zone at the apex. 15 x 6 x 5 mm   12.18.2023: Fusion biopsy. Prostate volume 32 mL.  3/4 cores from ROI 1 revealed GS 3+3 pattern in 20 or 30% of cores  All 4 cores from ROI 2 were benign  4/12 systematic cores were positive-2 cores( left mid lateral, left mid medial) revealed GS 3+3 pattern and 20  and 5% of cores respectively.  2 cores (left base lateral, right apex medial) revealed GS 3+4 pattern in 10% of cores   1.3.2023: He is here today for prostate cancer discussion. He has had no issues since his biopsy. His wife Ryan [name] comes with him.   12/01/2022:  1. Localized favorable intermediate risk prostate cancer:  -He  was initially diagnosed with low-volume grade group 1 prostate cancer in 04/2013 and has been on active surveillance since then. Most recently, he underwent MRI fusion biopsy in 06/07/2022 that demonstrated 2 lesions left peripheral zone, 1 PI-RADS 4 and another PI-RADS 3. Repeat PSA in 06/2022 was 2.91. MRI fusion biopsy revealed volume 31.8 cc. Out of the 20 core biopsies, several are positive. The maximum Gleason score was 3+4 and this is in the left base lateral and right apex. Additionally Gleason 3+3 within the left mid, left mid lateral and 3/4 samples from the MRI ROI lesion 1.  -He was initially scheduled undergo brachytherapy however canceled this is Dr. Retta [name] Is retiring. He is interested in undergoing brachytherapy.   2. Erectile dysfunction: He has a long history of erectile dysfunction and takes tadalafil 5 mg daily as needed. He denies side effects. He denies taking nitroglycerin.   01/10/2023:  Patient presents for preoperative clearance with his wife at his side for upcoming brachytherapy with SpaceOAR procedure as scheduled on 7/29. He maintains daily tadalafil. Since last seen, patient endorses no changes to voiding function or overall medical health. He has had no new diagnoses, procedures, or initiated new medications. Today, patient is urinating well, and denies irritative symptoms, gross hematuria, fever/chills, nausea/vomiting. He and his wife have several questions about upcoming procedure.     ALLERGIES: Dust Sildenafil Citrate TABS - Headache, light sensitivity    MEDICATIONS: Levothyroxine Sodium 50 mcg tablet  Metoprolol Tartrate 25 mg tablet  Acyclovir 400 MG Oral Tablet PRN  Atorvastatin Calcium 20 mg tablet tablet  B-Complex 400 mcg tablet Oral  Co Q-10 100 mg-5 unit capsule Oral  Cytomel 5 mcg tablet  Loratadine PRN  Magnesium Oxide 400 mg magnesium tablet Oral  Pantoprazole Sodium 40 mg tablet, delayed release 1 tablet PO Daily  Tadalafil 5 mg tablet 1 tablet  PO Daily  Vitamin C TABS Oral  Vitamin D3 50 mcg (2,000 unit) tablet Oral  Vitamin E TABS Oral  Zinc 15 mg tablet Oral     GU PSH: Prostate Needle Biopsy - 06/21/2022, 2020, 2018 Vasectomy - about 1981       PSH Notes: Cosmetic Surgery, Ankle Surgery, Surgery Of Male Genitalia Vasectomy, cyst removal from right shoulder blade   NON-GU PSH: Cataract surgery, Left, x 2 Surgical Pathology, Gross And Microscopic Examination For Prostate Needle - 06/21/2022, 2020, 2018 Visit Complexity (formerly GPC1X) - 12/01/2022     GU PMH: ED due to arterial insufficiency - 12/01/2022, He does well with Cialis, - 12/18/2021 (Stable), He does fairly well with 20 mg of Cialis, - 06/17/2021, doing well with Cialis., - 2022, Doing well on sildenafil, - 2021, On medical therapy., - 2020, - 2019 (Stable), He would like an alternative to sildenafil, - 2019, Erectile dysfunction due to arterial insufficiency, - 2017 Prostate Cancer - 12/01/2022, Grade group 2 prostate cancer, he has been followed with active surveillance for approximately 9 years. There is great progression based on his most recent biopsy., - 07/07/2022, - 06/21/2022, PSAVery low risk prostate cancer, grade group 1, on active surveillance. [place] decreasing. Stable benign exam today, - 12/18/2021, Low volume  low risk prostate cancer with stable DRE/PSA, - 06/17/2021, Very low risk prostate cancer, still on active surveillance with lower PSA and normal DRE today, - 2022, On AS w/ continued low risk dz, fairly stable PSA, - 2021, PSA stable. No significant voiding complaints aside from very rare leakage that is more than likely exacerbated by his high caffeine intake. We will continue with surveillance. , - 2021, - 2020, Low risk/relatively low volume prostate cancer on active surveillance with stable exam and fairly stable PSA curve , - 2020 (Stable), Low-grade/low risk prostate cancer, on active surveillance since initial diagnosis in October, 2014. Stable  exam/PSA/surveillance biopsies, - 2019, On active surveillance protocol. Low volume/low-grade disease. PSA actually declining. Exam today benign, - 2019, - 2018 (Stable), He is being followed with active surveillance. He has low risk disease, although 6 different areas of his prostate had shown Gleason 3+3 = 6 adenocarcinoma. His PSA is stable/lower., - 2018, - 2017, Adenocarcinoma of prostate, - 2017 Retarded ejaculation, Probably somewhat related to anxiety. He is on no medical therapy that would cause this - 2021 Primary hypogonadism, Hypogonadism, testicular - 2017 Elevated PSA, Elevated prostate specific antigen (PSA) - 2014 Prostate, Neoplasm of uncertain behavior, Neoplasm of uncertain behavior of prostate - 2014      PMH Notes:  2013-03-28 11:08:29 - Note: Arthritis   NON-GU PMH: Encounter for general adult medical examination without abnormal findings, Encounter for preventive health examination - 2016 Herpesviral infection of urogenital system, unspecified, Herpes, genital - 2016 Personal history of other diseases of the digestive system, History of esophageal reflux - 2014 Personal history of other diseases of the nervous system and sense organs, History of sleep apnea - 2014 Personal history of other endocrine, nutritional and metabolic disease, History of hypercholesterolemia - 2014, History of hypothyroidism, - 2014 Pneumonia, unspecified organism    Immunizations: None   FAMILY HISTORY: 1 Daughter - Daughter 5 sons - Son Cancer - Father Congestive Heart Failure - Mother Death In The Family Father - Father Death In The Family Mother - Mother liver cancer - Father Reported Previous Cardiac Problems - Runs In Family   SOCIAL HISTORY: Marital Status: Married Preferred Language: English; Ethnicity: Not Hispanic Or Latino; Race: White Current Smoking Status: Patient does not smoke anymore.  Does not use smokeless tobacco. Drinks 2 drinks per week. Types of alcohol consumed:  Wine.  Does not use drugs. Drinks 3 caffeinated drinks per day. Has not had a blood transfusion. Patient's occupation is/was Retired.     Notes: Former smoker, Occupation: Retired, Alcohol Use, Caffeine Use, Marital History - Currently Married   REVIEW OF SYSTEMS:    GU Review Male:   Patient denies frequent urination, hard to postpone urination, burning/ pain with urination, get up at night to urinate, leakage of urine, stream starts and stops, trouble starting your stream, have to strain to urinate , erection problems, and penile pain.  Gastrointestinal (Upper):   Patient denies nausea, vomiting, and indigestion/ heartburn.  Gastrointestinal (Lower):   Patient denies constipation and diarrhea.  Constitutional:   Patient denies fever, night sweats, weight loss, and fatigue.  Skin:   Patient denies skin rash/ lesion and itching.  Eyes:   Patient denies blurred vision and double vision.  Ears/ Nose/ Throat:   Patient denies sore throat and sinus problems.  Hematologic/Lymphatic:   Patient denies swollen glands and easy bruising.  Cardiovascular:   Patient denies leg swelling and chest pains.  Respiratory:   Patient denies cough and shortness of  breath.  Endocrine:   Patient denies excessive thirst.  Musculoskeletal:   Patient denies back pain and joint pain.  Neurological:   Patient denies headaches and dizziness.  Psychologic:   Patient denies depression and anxiety.   Notes: No concerns    VITAL SIGNS:      01/10/2023 11:36 AM  BP 148/72 mmHg  Pulse 74 /min  Temperature 97.3 F / 36.2 C   MULTI-SYSTEM PHYSICAL EXAMINATION:    Constitutional: Well-nourished. No physical deformities. Normally developed. Good grooming. Patient is pleasant, in no acute discomfort, distress.  Respiratory: No labored breathing, no use of accessory muscles. Lungs clear to auscultation bilaterally. No wheezing, rales.  Cardiovascular: Normal temperature, normal extremity pulses, no swelling, no  varicosities. Regular rhythm. No murmurs, gallops.  Neurologic / Psychiatric: Oriented to time, oriented to place, oriented to person. No depression, no anxiety, no agitation.   Gastrointestinal: No mass, no suprapubic tenderness, no rigidity, non obese abdomen.      Complexity of Data:  Source Of History:  Patient, Family/Caregiver, Medical Record Summary  Records Review:   Previous Doctor Records, Previous Patient Records  Urine Test Review:   Urinalysis   11/24/22 06/16/22 12/09/21 06/10/21 12/02/20 05/27/20 10/17/19 02/13/19  PSA  Total PSA 3.05 ng/mL 2.91 ng/mL 2.42 ng/mL 2.59 ng/mL 2.81 ng/mL 4.12 ng/mL 2.82 ng/mL 2.60 ng/mL    12/02/20 05/20/06 12/28/04  Hormones  Testosterone, Total 708.7 ng/dL 1.30  8.65     78/46/96  Urinalysis  Urine Appearance Clear   Urine Color Yellow   Urine Glucose Neg mg/dL  Urine Bilirubin Neg mg/dL  Urine Ketones Neg mg/dL  Urine Specific Gravity 1.015   Urine Blood Neg ery/uL  Urine pH 6.0   Urine Protein Neg mg/dL  Urine Urobilinogen 0.2 mg/dL  Urine Nitrites Neg   Urine Leukocyte Esterase Neg leu/uL   PROCEDURES:          Urinalysis Dipstick Dipstick Cont'd  Color: Yellow Bilirubin: Neg mg/dL  Appearance: Clear Ketones: Neg mg/dL  Specific Gravity: 2.952 Blood: Neg ery/uL  pH: 6.0 Protein: Neg mg/dL  Glucose: Neg mg/dL Urobilinogen: 0.2 mg/dL    Nitrites: Neg    Leukocyte Esterase: Neg leu/uL    ASSESSMENT:      ICD-10 Details  1 GU:   Prostate Cancer - C61 Chronic, Threat to Bodily Function  2   Elevated PSA - R97.20 Chronic, Stable   PLAN:            Medications Stop Meds: Diazepam 10 mg tablet 1 tablet PO 2 hours before procedure  Start: 06/11/2022  Discontinue: 01/10/2023  - Reason: The medication cycle was completed.  Diazepam 10 mg tablet 1 PO 1 hr prior to biopsy  Start: 12/22/2018  Discontinue: 01/10/2023  - Reason: The medication cycle was completed.            Orders Labs Urine Culture           Schedule Return Visit/Planned Activity: Keep Scheduled Appointment             Note: Brachytherapy with SpaceOAR on 7/29          Document Letter(s):  Created for Patient: Clinical Summary         Notes:   Today, UA WNL. We will send for presurgical clearance and follow-up with patient as appropriate. Patient is at baseline voiding function. He has had no changes since last seen to general health or baseline voiding function.   We reviewed with patient and his  wife preoperative requirements, procedure, and postoperative follow-up in detail. We answered patient and his wife's questions to the best of our abilities. They had no further concerns or questions at conclusion of appointment.   Maintain upcoming surgical appointment for brachytherapy and SpaceOAR. Patient knows to inform this office of any interim changes to voiding function or general medical history. Continue on tadalafil as directed by preoperative admissions and anesthesia. Patient and his wife both voiced understanding and are amenable to this plan.        Next Appointment:      Next Appointment: 01/31/2023 09:30 AM    Appointment Type: Surgery     Location: [facility] [phone]    Provider: Jettie [name], M.D.    Reason for Visit: NE/OP BRACHYTHERAPY, SPACE POAR, CYSTO    Urology Preoperative H&P   Chief Complaint: Prostate cancer  History of Present Illness: Ryan [name] is a 77 y.o. male with a history of prostate cancer here for brachytherapy with SpaceOAR. Denies fevers, chills, dysuria.    Past Medical History:  Diagnosis Date   Arthritis 03/28/2013   Back pain    Cancer (HCC) 2014   prostate cancer   Cataracts, bilateral    Dysrhythmia    occasional PAC- asymptomatic, per MD pt cut back on caffeine   ED (erectile dysfunction) 12/18/2021   due to arterial insufficency   Fungal nail infection    GERD (gastroesophageal reflux disease)    Hearing loss    wear hearing aids   HSV  infection 2016   Hyperlipidemia    Hypertension    no meds, dx yrs ago per patient but normal last several yrs   Hypertensive retinopathy    OU   Hypothyroidism    Personal history of other diseases of the digestive system    Personal history of other diseases of the nervous system and sense organs 2014   Personal history of other endocrine, nutritional and metabolic disease 9528   Pneumonia    x 1   Primary testicular hypogonadism 2017   Retarded ejaculation 2021   Sleep apnea 2014   history of, did not qualify for cpap    Past Surgical History:  Procedure Laterality Date   ankle surgery trauma Right    CATARACT EXTRACTION Left 11/29/2018   Dr. Nile Riggs   COLONOSCOPY     COSMETIC SURGERY     loose skin removed around neck area   eye lid surgery     EYE SURGERY     fungal nail     LASIK Bilateral    PARS PLANA VITRECTOMY Left 03/01/2019   Procedure: PARS PLANA VITRECTOMY WITH 25 GAUGE;  Surgeon: Rennis Chris, MD;  Location: [facility] OR;  Service: Ophthalmology;  Laterality: Left;   PROSTATE BIOPSY  06/21/2022   2020, 2018   REMOVAL RETAINED LENS Left 03/01/2019   Procedure: REMOVAL OF RETAINED LENS MATERIALS LEFT EYE;  Surgeon: Rennis Chris, MD;  Location: [facility] OR;  Service: Ophthalmology;  Laterality: Left;   VASECTOMY  1981   WISDOM TOOTH EXTRACTION      Allergies:  Allergies  Allergen Reactions   Bee Venom     Other reaction(s): Drowsy   Dust Mite Extract     UNSPECIFIED REACTION    Grass Extracts [Gramineae Pollens]     UNSPECIFIED REACTION     Family History  Problem Relation Age of Onset   Heart disease Mother    Liver disease Father    Alcohol abuse Father  Cancer Father        hepatic cancer ?primary   Arthritis Sister    COPD Sister     Social History:  reports that he quit smoking about 41 years ago. His smoking use included cigarettes. He has never used smokeless tobacco. He reports current alcohol use of about 3.0 - 4.0 standard drinks of alcohol  per week. He reports that he does not use drugs.  ROS: A complete review of systems was performed.  All systems are negative except for pertinent findings as noted.  Physical Exam:  Vital signs in last 24 hours: Temp:  [97.4 F (36.3 C)] 97.4 F (36.3 C) (07/29 0750) Pulse Rate:  [59] 59 (07/29 0750) Resp:  [16] 16 (07/29 0750) BP: (142)/(68) 142/68 (07/29 0750) SpO2:  [99 %] 99 % (07/29 0750) Weight:  [75.2 kg] 75.2 kg (07/29 0750) Constitutional:  Alert and oriented, No acute distress Cardiovascular: Regular rate and rhythm Respiratory: Normal respiratory effort, Lungs clear bilaterally GI: Abdomen is soft, nontender, nondistended, no abdominal masses GU: No CVA tenderness Lymphatic: No lymphadenopathy Neurologic: Grossly intact, no focal deficits Psychiatric: Normal mood and affect  Laboratory Data:  No results for input(s): "WBC", "HGB", "HCT", "PLT" in the last 72 hours.  No results for input(s): "NA", "K", "CL", "GLUCOSE", "BUN", "CALCIUM", "CREATININE" in the last 72 hours.  Invalid input(s): "CO3"   No results found for this or any previous visit (from the past 24 hour(s)). No results found for this or any previous visit (from the past 240 hour(s)).  Renal Function: No results for input(s): "CREATININE" in the last 168 hours. CrCl cannot be calculated (Patient's most recent lab result is older than the maximum 21 days allowed.).  Radiologic Imaging: No results found.  I independently reviewed the above imaging studies.  Assessment and Plan Ryan [name] is a 77 y.o. male with ith a history of prostate cancer here for brachytherapy with SpaceOAR.   Brachytherapy/space OAR consent- The patient was counseled about the natural history of prostate cancer and the standard treatment options that are available for prostate cancer. It was explained to him how his age and life expectancy, clinical stage, Gleason score, and PSA affect his prognosis, the decision to  proceed with additional staging studies, as well as how that information influences recommended treatment strategies. We discussed the roles for active surveillance, radiation therapy, surgical therapy, androgen deprivation, as well as ablative therapy options for the treatment of prostate cancer as appropriate to his individual cancer situation. We discussed the risks and benefits of these options with regard to their impact on cancer control and also in terms of potential adverse events, complications, and impact on quality of life particularly related to urinary and sexual function. The patient was encouraged to ask questions throughout the discussion today and all questions were answered to his stated satisfaction. In addition, the patient was provided with and/or directed to appropriate resources and literature for further education about prostate cancer and treatment options.   The patient has decided to proceed with brachytherapy and SpaceOAR placement as primary treatment of his intermediate risk prostate cancer.  The risks, benefits and alternatives of the aforementioned procedures was discussed in detail.  Risks include, bur are not limited to worsening LUTS, erectile dysfunction, rectal irritation, urethral stricture formation, fistula formation, cancer recurrence, MI, CVA, PE, DVT and the inherent risk of general anesthesia.  He voices understanding and wishes to proceed.   Matt R. Myalynn Lingle MD 01/31/2023, 8:25 AM  [facility] Pager: [phone]

## 2023-02-01 ENCOUNTER — Encounter (HOSPITAL_BASED_OUTPATIENT_CLINIC_OR_DEPARTMENT_OTHER): Payer: Self-pay | Admitting: Urology

## 2023-02-02 MED ORDER — METOPROLOL TARTRATE 25 MG PO TABS: 25.0000 mg | ORAL_TABLET | Freq: Two times a day (BID) | ORAL | 3 refills | Status: DC

## 2023-02-03 ENCOUNTER — Other Ambulatory Visit: Payer: Self-pay

## 2023-02-03 ENCOUNTER — Encounter (HOSPITAL_COMMUNITY): Payer: Self-pay | Admitting: *Deleted

## 2023-02-03 ENCOUNTER — Emergency Department (HOSPITAL_COMMUNITY)
Admission: EM | Admit: 2023-02-03 | Discharge: 2023-02-03 | Disposition: A | Discharge status: Home / Self Care | Payer: No Typology Code available for payment source | Attending: Emergency Medicine | Admitting: Emergency Medicine

## 2023-02-03 DIAGNOSIS — Z8546 Personal history of malignant neoplasm of prostate: Secondary | ICD-10-CM | POA: Diagnosis not present

## 2023-02-03 DIAGNOSIS — R1030 Lower abdominal pain, unspecified: Secondary | ICD-10-CM | POA: Diagnosis not present

## 2023-02-03 DIAGNOSIS — R300 Dysuria: Secondary | ICD-10-CM | POA: Diagnosis present

## 2023-02-03 DIAGNOSIS — R339 Retention of urine, unspecified: Secondary | ICD-10-CM | POA: Diagnosis not present

## 2023-02-03 LAB — URINALYSIS, ROUTINE W REFLEX MICROSCOPIC
Bacteria, UA: NONE SEEN
Bilirubin Urine: NEGATIVE
Glucose, UA: NEGATIVE mg/dL
Ketones, ur: NEGATIVE mg/dL
Nitrite: NEGATIVE
Protein, ur: NEGATIVE mg/dL
RBC / HPF: >50 RBC/hpf (ref 0–5)
Specific Gravity, Urine: 1.009 (ref 1.005–1.030)
pH: 8 (ref 5.0–8.0)

## 2023-02-03 MED ORDER — TAMSULOSIN HCL 0.4 MG PO CAPS: 0.4000 mg | ORAL_CAPSULE | Freq: Every day | ORAL | 0 refills | Status: DC

## 2023-02-03 MED ORDER — LIDOCAINE HCL URETHRAL/MUCOSAL 2 % EX GEL
1.0000 | Freq: Once | CUTANEOUS | Status: AC
  Administered 2023-02-03: 1 via URETHRAL
  Filled 2023-02-03: qty 11

## 2023-02-03 NOTE — Discharge Instructions (Addendum)
You have been evaluated for your symptoms.  A Foley has been inserted.  Please take Flomax daily as prescribed and call urology office to follow-up in 1 week.  Return if you have any concern.

## 2023-02-03 NOTE — ED Triage Notes (Signed)
Pt had radiation seeds implanted to Prostate on Monday, Pt has not been able to void normal since, now has pain, frequency, hematuria with blood clots.

## 2023-02-03 NOTE — ED Notes (Signed)
Pt was able to void 200 mL in the last past hour.

## 2023-02-03 NOTE — ED Provider Notes (Signed)
[place] EMERGENCY DEPARTMENT AT [facility] Provider Note   CSN: [id number] Arrival date & time: 02/03/23  1402     History  Chief Complaint  Patient presents with   Dysuria    Ryan [name] is a 77 y.o. male.  The history is provided by the patient, medical records and the spouse. No language interpreter was used.  Dysuria Presenting symptoms: dysuria      77 year old male significant history of prostate cancer recently had brachytherapy with SpaceOAR done by urologist Dr. Cardell Peach on 7/29 presenting with concerns of urinary retention.  Since the procedure patient states he has difficulty urinating and endorse sensation of incomplete this.  He is only able to urinate a small amount each time and he feels quite bit of pressure in his bladder area.  Symptoms seem to persist and becoming progressively worse prompting this ER visit.  He also noticed occasional blood in his urine.  His last bowel movement was today and it was normal.  Endorsed mild nausea without vomiting no fever or chills no chest pain no shortness of breath.  He did reach out to urology office who encouraged patient to come to the ER for further assessment.  Home Medications Prior to Admission medications   Medication Sig Start Date End Date Taking? Authorizing Provider  Ascorbic Acid (VITAMIN C) 1000 MG tablet Take 1,000 mg by mouth daily.    [provider]  atorvastatin (LIPITOR) 20 MG tablet Take 1 tablet (20 mg total) by mouth daily. 11/02/22   Wendall Stade, MD  Cholecalciferol (VITAMIN D) 1000 UNITS capsule Take 1,000 Units by mouth daily.    [provider]  Coenzyme Q10 100 MG capsule Take 100 mg by mouth daily.    [provider]  Cyanocobalamin (VITAMIN B-12 CR) 1500 MCG TBCR Take 1,500 mcg by mouth daily.    [provider]  docusate sodium (COLACE) 100 MG capsule Take 1 capsule (100 mg total) by mouth daily as needed for up to 30 doses. 01/31/23   Jannifer Hick, MD  EPINEPHrine 0.3 mg/0.3 mL IJ SOAJ injection INJECT 0.3 ML INTRAMUSCULARLY ONCE AS NEEDED AS DIRECTED 03/04/21   [provider]  fluticasone (FLONASE) 50 MCG/ACT nasal spray Place 1 spray into both nostrils daily as needed for allergies or rhinitis.    [provider]  levothyroxine (SYNTHROID) 50 MCG tablet TAKE ONE TABLET BY MOUTH DAILY FOR HYPOTHYROIDISM 09/04/21   [provider]  liothyronine (CYTOMEL) 5 MCG tablet Take 5 mcg by mouth daily.    [provider]  loratadine (CLARITIN) 10 MG tablet Take 10 mg by mouth daily as needed for allergies.     [provider]  MAGNESIUM CITRATE PO Take 400 mg by mouth daily.     [provider]  metoprolol tartrate (LOPRESSOR) 25 MG tablet Take 1 tablet (25 mg total) by mouth 2 (two) times daily. 02/02/23   Wendall Stade, MD  oxyCODONE-acetaminophen (PERCOCET) 5-325 MG tablet Take 1 tablet by mouth every 4 (four) hours as needed for up to 18 doses. 01/31/23   Jannifer Hick, MD  pantoprazole (PROTONIX) 40 MG tablet TAKE 1 TABLET BY MOUTH EVERY DAY 01/04/23   Hilarie Fredrickson, MD  RESTASIS 0.05 % ophthalmic emulsion Place 1 drop into the left eye 2 (two) times daily. 10/30/20   [provider]  tadalafil (CIALIS) 5 MG tablet Take 5 mg by mouth daily as needed for erectile dysfunction.  08/01/18  [provider]  valACYclovir (VALTREX) 500 MG tablet TAKE ONE TABLET BY MOUTH TWICE DAILY FOR 3 DAYS AS NEEDED FOR HERPES FLARE 08/24/21   Burchette, Elberta Fortis, MD  vitamin E 400 UNIT capsule Take 400 Units by mouth daily.    [provider]  Zinc 50 MG CAPS Take 50 mg by mouth daily.    [provider]      Allergies    Bee venom, Dust mite extract, and Grass extracts [gramineae pollens]    Review of Systems   Review of Systems  Genitourinary:  Positive for dysuria.  All other systems reviewed and are negative.   Physical Exam Updated Vital Signs BP (!) 155/69  (BP Location: Left Arm)   Pulse 61   Temp 97.9 F (36.6 C) (Oral)   Resp 18   Ht 5\' 9"  (1.753 m)   Wt 74.8 kg   SpO2 100%   BMI 24.37 kg/m  Physical Exam Vitals and nursing note reviewed.  Constitutional:      General: He is not in acute distress.    Appearance: He is well-developed.  HENT:     Head: Atraumatic.  Eyes:     Conjunctiva/sclera: Conjunctivae normal.  Cardiovascular:     Rate and Rhythm: Normal rate and regular rhythm.     Pulses: Normal pulses.     Heart sounds: Normal heart sounds.  Pulmonary:     Effort: Pulmonary effort is normal.     Breath sounds: Normal breath sounds.  Abdominal:     Tenderness: There is abdominal tenderness (Tenderness to suprapubic region with some fullness noted.).  Musculoskeletal:     Cervical back: Neck supple.  Skin:    Findings: No rash.  Neurological:     Mental Status: He is alert.     ED Results / Procedures / Treatments   Labs (all labs ordered are listed, but only abnormal results are displayed) Labs Reviewed  URINALYSIS, ROUTINE W REFLEX MICROSCOPIC - Abnormal; Notable for the following components:      Result Value   Color, Urine AMBER (*)    APPearance CLOUDY (*)    Hgb urine dipstick LARGE (*)    Leukocytes,Ua TRACE (*)    All other components within normal limits    EKG None  Radiology No results found.  Procedures Procedures    Medications Ordered in ED Medications  lidocaine (XYLOCAINE) 2 % jelly 1 Application (1 Application Urethral Given 02/03/23 1723)    ED Course/ Medical Decision Making/ A&P                                 Medical Decision Making Amount and/or Complexity of Data Reviewed Labs: ordered.  Risk Prescription drug management.   BP (!) 155/69 (BP Location: Left Arm)   Pulse 61   Temp 97.9 F (36.6 C) (Oral)   Resp 18   Ht 5\' 9"  (1.753 m)   Wt 74.8 kg   SpO2 100%   BMI 24.37 kg/m   54:48 PM  77 year old male significant history of prostate cancer recently had  brachytherapy with SpaceOAR done by urologist Dr. Cardell Peach on 7/29 presenting with concerns of urinary retention.  Since the procedure patient states he has difficulty urinating and endorse sensation of incomplete this.  He is only able to urinate a small amount each time and he feels quite bit of pressure in his bladder area.  Symptoms seem to persist  and becoming progressively worse prompting this ER visit.  He also noticed occasional blood in his urine.  His last bowel movement was today and it was normal.  Endorsed mild nausea without vomiting no fever or chills no chest pain no shortness of breath.  He did reach out to urology office who encouraged patient to come to the ER for further assessment.  On exam this is a well-groomed male sitting in the chair appears slightly uncomfortable.  Heart with normal rate and rhythm, lungs clear to auscultation bilaterally abdomen is tender to suprapubic region with some bladder fullness noted.  Postvoid bladder scan performed showing 599 ml of retained urine.  Will insert foley and will consult urology.  5:15 PM Appreciate consultation from urologist Dr. Cardell Peach who request patient to have a Foley catheter 64 [place].  Patient to be started on tamsulosin daily for 1 week and he will need to follow-up in the office in 1 week.  -Labs ordered, independently viewed and interpreted by me.  Labs remarkable for UA showing large Hgb but no UTI.  Foley was inserted and irrigated. -The patient was maintained on a cardiac monitor.  I personally viewed and interpreted the cardiac monitored which showed an underlying rhythm of: NSR -Imaging including cystoscopy was considered but not perform in the ER setting -This patient presents to the ED for concern of acute urinary retention, this involves an extensive number of treatment options, and is a complaint that carries with it a high risk of complications and morbidity.  The differential diagnosis includes post procedure complication,  urethral stricture, medication induce, blood clot -Co morbidities that complicate the patient evaluation includes prostate CA -Treatment includes foley insertion -Reevaluation of the patient after these medicines showed that the patient improved -PCP office notes or outside notes reviewed -Discussion with specialist urologist Dr. Cardell Peach -Escalation to admission/observation considered: patients feels much better, is comfortable with discharge, and will follow up with urology in 1 week -Prescription medication considered, patient comfortable with flomax -Social Determinant of Health considered which includes tobacco use          Final Clinical Impression(s) / ED Diagnoses Final diagnoses:  Urinary retention    Rx / DC Orders ED Discharge Orders     None         Fayrene Helper, PA-C 02/03/23 1814    Rolan Bucco, MD 02/03/23 2247

## 2023-02-11 ENCOUNTER — Telehealth: Payer: Self-pay | Admitting: *Deleted

## 2023-02-11 NOTE — Telephone Encounter (Signed)
Transition Care Management Unsuccessful Follow-up Telephone Call  Date of discharge and from where:  [place] ed 12/04/2022  Attempts:  2nd Attempt  Reason for unsuccessful TCM follow-up call: patient declined

## 2023-02-20 ENCOUNTER — Encounter (HOSPITAL_COMMUNITY): Payer: Self-pay

## 2023-02-20 ENCOUNTER — Emergency Department (HOSPITAL_COMMUNITY)
Admission: EM | Admit: 2023-02-20 | Discharge: 2023-02-20 | Disposition: A | Discharge status: Home / Self Care | Payer: Medicare Other | Source: Home / Self Care | Attending: Emergency Medicine | Admitting: Emergency Medicine

## 2023-02-20 DIAGNOSIS — R300 Dysuria: Secondary | ICD-10-CM | POA: Diagnosis present

## 2023-02-20 DIAGNOSIS — I10 Essential (primary) hypertension: Secondary | ICD-10-CM | POA: Diagnosis not present

## 2023-02-20 DIAGNOSIS — Z466 Encounter for fitting and adjustment of urinary device: Secondary | ICD-10-CM | POA: Diagnosis not present

## 2023-02-20 DIAGNOSIS — E039 Hypothyroidism, unspecified: Secondary | ICD-10-CM | POA: Diagnosis not present

## 2023-02-20 MED ORDER — OXYBUTYNIN CHLORIDE ER 5 MG PO TB24: 5.0000 mg | ORAL_TABLET | Freq: Every day | ORAL | 0 refills | Status: DC

## 2023-02-20 MED ORDER — LIDOCAINE HCL URETHRAL/MUCOSAL 2 % EX GEL
1.0000 | Freq: Once | CUTANEOUS | Status: AC
  Administered 2023-02-20: 1 via URETHRAL
  Filled 2023-02-20: qty 11

## 2023-02-20 MED ORDER — OXYCODONE-ACETAMINOPHEN 5-325 MG PO TABS
1.0000 | ORAL_TABLET | Freq: Once | ORAL | Status: AC
  Administered 2023-02-20: 1 via ORAL
  Filled 2023-02-20: qty 1

## 2023-02-20 MED ORDER — OXYBUTYNIN CHLORIDE 5 MG PO TABS
5.0000 mg | ORAL_TABLET | Freq: Three times a day (TID) | ORAL | Status: DC
  Administered 2023-02-20: 5 mg via ORAL
  Filled 2023-02-20: qty 1

## 2023-02-20 NOTE — ED Provider Notes (Signed)
[place] EMERGENCY DEPARTMENT AT [facility] Provider Note   CSN: [id number] Arrival date & time: 02/20/23  5784     History  Chief Complaint  Patient presents with   Dysuria    Ryan [name] is a 77 y.o. male with PMHx HLD, HTN, Hypothyroidism, chronic urinary catheter d/t brachytherapy who presents to ED concerned with pain from urinary catheter. Patient had catheter placed on 02/03/2023 and has been concerned with pain and irritation at foley insertion site. Pain worsened this morning around 4:30AM.  Of note, patient talked with his Urologist Dr. Wilson Singer while in triage who stated patient would benefit from replacing catheter with 55F silastic silicone catheter and prescription for oxybutin.  Denies fever, dyspnea, abdominal pain, nausea, vomiting, diarrhea.   Dysuria Presenting symptoms: dysuria        Home Medications Prior to Admission medications   Medication Sig Start Date End Date Taking? Authorizing Provider  oxybutynin (DITROPAN XL) 5 MG 24 hr tablet Take 1 tablet (5 mg total) by mouth at bedtime. 02/20/23  Yes Valrie Hart F, PA-C  Ascorbic Acid (VITAMIN C) 1000 MG tablet Take 1,000 mg by mouth daily.    [provider]  atorvastatin (LIPITOR) 20 MG tablet Take 1 tablet (20 mg total) by mouth daily. 11/02/22   Wendall Stade, MD  Cholecalciferol (VITAMIN D) 1000 UNITS capsule Take 1,000 Units by mouth daily.    [provider]  Coenzyme Q10 100 MG capsule Take 100 mg by mouth daily.    [provider]  Cyanocobalamin (VITAMIN B-12 CR) 1500 MCG TBCR Take 1,500 mcg by mouth daily.    [provider]  docusate sodium (COLACE) 100 MG capsule Take 1 capsule (100 mg total) by mouth daily as needed for up to 30 doses. 01/31/23   Jannifer Hick, MD  EPINEPHrine 0.3 mg/0.3 mL IJ SOAJ injection INJECT 0.3 ML INTRAMUSCULARLY ONCE AS NEEDED AS DIRECTED 03/04/21   [provider]  fluticasone (FLONASE) 50 MCG/ACT nasal  spray Place 1 spray into both nostrils daily as needed for allergies or rhinitis.    [provider]  levothyroxine (SYNTHROID) 50 MCG tablet TAKE ONE TABLET BY MOUTH DAILY FOR HYPOTHYROIDISM 09/04/21   [provider]  liothyronine (CYTOMEL) 5 MCG tablet Take 5 mcg by mouth daily.    [provider]  loratadine (CLARITIN) 10 MG tablet Take 10 mg by mouth daily as needed for allergies.     [provider]  MAGNESIUM CITRATE PO Take 400 mg by mouth daily.     [provider]  metoprolol tartrate (LOPRESSOR) 25 MG tablet Take 1 tablet (25 mg total) by mouth 2 (two) times daily. 02/02/23   Wendall Stade, MD  oxyCODONE-acetaminophen (PERCOCET) 5-325 MG tablet Take 1 tablet by mouth every 4 (four) hours as needed for up to 18 doses. 01/31/23   Jannifer Hick, MD  pantoprazole (PROTONIX) 40 MG tablet TAKE 1 TABLET BY MOUTH EVERY DAY 01/04/23   Hilarie Fredrickson, MD  RESTASIS 0.05 % ophthalmic emulsion Place 1 drop into the left eye 2 (two) times daily. 10/30/20   [provider]  tadalafil (CIALIS) 5 MG tablet Take 5 mg by mouth daily as needed for erectile dysfunction.  08/01/18   [provider]  tamsulosin (FLOMAX) 0.4 MG CAPS capsule Take 1 capsule (0.4 mg total) by mouth daily. 02/03/23   Fayrene Helper, PA-C  valACYclovir (VALTREX) 500 MG tablet TAKE ONE TABLET BY MOUTH TWICE DAILY  FOR 3 DAYS AS NEEDED FOR HERPES FLARE 08/24/21   Burchette, Elberta Fortis, MD  vitamin E 400 UNIT capsule Take 400 Units by mouth daily.    [provider]  Zinc 50 MG CAPS Take 50 mg by mouth daily.    [provider]      Allergies    Bee venom, Dust mite extract, and Grass extracts [gramineae pollens]    Review of Systems   Review of Systems  Genitourinary:  Positive for dysuria.    Physical Exam Updated Vital Signs BP (!) 168/83 (BP Location: Left Arm)   Pulse 82   Temp 97.6 F (36.4 C) (Oral)   Resp 16   Ht 5\' 9"  (1.753 m)   Wt 74.8 kg   SpO2  97%   BMI 24.37 kg/m  Physical Exam Vitals and nursing note reviewed.  Constitutional:      General: He is not in acute distress.    Appearance: He is not ill-appearing or toxic-appearing.  HENT:     Head: Normocephalic and atraumatic.     Mouth/Throat:     Mouth: Mucous membranes are moist.  Eyes:     General: No scleral icterus.       Right eye: No discharge.        Left eye: No discharge.     Conjunctiva/sclera: Conjunctivae normal.  Cardiovascular:     Rate and Rhythm: Normal rate.     Pulses: Normal pulses.     Heart sounds: Normal heart sounds. No murmur heard. Pulmonary:     Effort: Pulmonary effort is normal.  Abdominal:     General: Abdomen is flat. Bowel sounds are normal.     Palpations: Abdomen is soft.     Tenderness: There is no abdominal tenderness.     Comments: Mild tenderness to palpation when pressing over suprapubic area which patient states is baseline for him  Genitourinary:    Comments: No rash, erythema, or discharge appreciated of penis during catheter insertion. Musculoskeletal:     Right lower leg: No edema.     Left lower leg: No edema.  Skin:    General: Skin is warm and dry.     Findings: No rash.  Neurological:     General: No focal deficit present.     Mental Status: He is alert. Mental status is at baseline.  Psychiatric:        Mood and Affect: Mood normal.        Behavior: Behavior normal.     ED Results / Procedures / Treatments   Labs (all labs ordered are listed, but only abnormal results are displayed) Labs Reviewed - No data to display  EKG None  Radiology No results found.  Procedures Procedures    Medications Ordered in ED Medications  oxybutynin (DITROPAN) tablet 5 mg (5 mg Oral Given 02/20/23 1113)  oxyCODONE-acetaminophen (PERCOCET/ROXICET) 5-325 MG per tablet 1 tablet (1 tablet Oral Given 02/20/23 1037)  lidocaine (XYLOCAINE) 2 % jelly 1 Application (1 Application Urethral Given 02/20/23 1250)    ED Course/  Medical Decision Making/ A&P                                 Medical Decision Making Risk Prescription drug management.   This patient presents to the ED for concern of catheter pain, this involves an extensive number of treatment options, and is a complaint that carries with it a high  risk of complications and morbidity.     Co morbidities that complicate the patient evaluation   HLD, HTN, Hypothyroidism, chronic urinary catheter d/t brachytherapy     Problem List / ED Course / Critical interventions / Medication management  Patient presents to ED concern for catheter pain.  Patient talked to urologist while in triage who recommended 52F Silastic silicone catheter and a prescription for oxybutynin.  Provided patient with Percocet for pain control.  Nurse attempted insertion with 52F catheter without success.  Dr. Jacqulyn Bath was able to place 62F catheter.  I appreciate his help.  Urine draining appropriately into bag.  Patient stating that he feels a lot better.  I attempted to call patient's urologist for follow-up.  Talked to his secretary who states that she will let the urologist know that I am requesting a phone call.  Urologist has not returned my phone call.  Patient stating that he does not want a wait for the phone call and would like to go home. Prescription for oxybutynin to patient's pharmacy.  Recommended patient follow-up with urologist-patient verbally endorsed understanding of plan. I have reviewed the patients home medicines and have made adjustments as needed Patient afebrile with stable vitals.  Discharged in good condition.  Provided return precautions.   Social Determinants of Health:  none           Final Clinical Impression(s) / ED Diagnoses Final diagnoses:  Catheter (urine) change required    Rx / DC Orders ED Discharge Orders          Ordered    oxybutynin (DITROPAN XL) 5 MG 24 hr tablet  Daily at bedtime        02/20/23 1101               Dorthy Cooler, [place] 02/20/23 1354    Maia Plan, MD 02/21/23 [phone]

## 2023-02-20 NOTE — Discharge Instructions (Signed)
It was a pleasure caring for you today. We place a 39F catheter today. I have sent a prescription to your pharmacy for Oxybutin which does not appear to have contraindications to Flomax. Please follow up with urologist tomorrow. Seek emergency care if experiencing any new or worsening symptoms.

## 2023-02-20 NOTE — ED Provider Notes (Signed)
Medical screening examination/treatment/procedure(s) were conducted as a shared visit with non-physician practitioner(s) and myself.  I personally evaluated the patient during the encounter.  Patient sent in by Urology with plan for exchange of foley. There was difficulty in placing the silicone foley 46F by nursing and I was asked to assist.  I appreciated some resistance at the level of the prostate.  The 46 [place] would not pass.  I checked with the OR and they do not have a coud, silicone catheter but did send down a 16 [place] catheter which I was able to place without significant difficulty using sterile technique and after obtaining patient's verbal consent. Balloon inflated without pain.  Urine return is yellow without gross blood. Patient tolerated the procedure well without immediate complication.   Alona Bene, MD Emergency Medicine    Daris Harkins, Arlyss Repress, MD 02/20/23 1257

## 2023-02-20 NOTE — ED Triage Notes (Signed)
Pt arrived via POV, had catheter placed on 02/03/23 has had issues tolerating foley. Irritation and pain at foley insertion site, bladder spasms, some small instances of blood.   Dr Wilson Singer urology on phone in triage. States pt would benefit from catheter removal and replaced with 29F silastic silicone catheter and rx of oxybutynin.

## 2023-02-22 ENCOUNTER — Telehealth: Payer: Self-pay | Admitting: *Deleted

## 2023-02-22 NOTE — Telephone Encounter (Signed)
Called patient to remind of post seed appts. for 02-23-23, spoke with patient and he is aware of these appts.

## 2023-02-22 NOTE — Progress Notes (Signed)
Radiation Oncology         [phone] ________________________________  Name: Ryan [name] MRN: [id number]  Date: 02/23/2023  DOB: 04/12/1946  Post-Seed Follow-Up Visit Note  CC: Kristian Covey, MD  Marcine Matar, MD  Diagnosis:   77 y.o. gentleman with Stage T1c adenocarcinoma of the prostate with Gleason score of 3+4, and PSA of 2.91.     ICD-10-CM   1. Prostate cancer (HCC)  C61       Interval Since Last Radiation:  3 weeks 01/31/23:  Insertion of radioactive I-125 seeds into the prostate gland; 145 Gy, definitive therapy with placement of SpaceOAR gel.  Narrative:  The patient returns today for routine follow-up.  He is complaining of increased urinary frequency and urinary hesitation symptoms. His pre-implant score was 15. He denies any abdominal pain or bowel symptoms. He did go into urinary retention 02/04/23 and had to have a foley catheter placed in the ED. He was seen in the urology office 02/14/23 but had to go back to the ED 02/20/23 due to severe pain associated with the indwelling foley catheter. He was prescribed oxybutynin to help with bladder spasms at that time and has continued taking Flomax and Cialis daily with plans for a voiding trial 03/03/23 with Buzzy Han, PA-C. He is frustrated with his postoperative course.  ALLERGIES:  is allergic to bee venom, dust mite extract, and grass extracts [gramineae pollens].  Meds: Current Outpatient Medications  Medication Sig Dispense Refill   Ascorbic Acid (VITAMIN C) 1000 MG tablet Take 1,000 mg by mouth daily.     atorvastatin (LIPITOR) 20 MG tablet Take 1 tablet (20 mg total) by mouth daily. 90 tablet 3   Cholecalciferol (VITAMIN D) 1000 UNITS capsule Take 1,000 Units by mouth daily.     Coenzyme Q10 100 MG capsule Take 100 mg by mouth daily.     Cyanocobalamin (VITAMIN B-12 CR) 1500 MCG TBCR Take 1,500 mcg by mouth daily.     docusate sodium (COLACE) 100 MG capsule Take 1 capsule (100 mg total) by mouth  daily as needed for up to 30 doses. 30 capsule 0   EPINEPHrine 0.3 mg/0.3 mL IJ SOAJ injection INJECT 0.3 ML INTRAMUSCULARLY ONCE AS NEEDED AS DIRECTED     fluticasone (FLONASE) 50 MCG/ACT nasal spray Place 1 spray into both nostrils daily as needed for allergies or rhinitis.     levothyroxine (SYNTHROID) 50 MCG tablet TAKE ONE TABLET BY MOUTH DAILY FOR HYPOTHYROIDISM     liothyronine (CYTOMEL) 5 MCG tablet Take 5 mcg by mouth daily.     loratadine (CLARITIN) 10 MG tablet Take 10 mg by mouth daily as needed for allergies.      MAGNESIUM CITRATE PO Take 400 mg by mouth daily.      metoprolol tartrate (LOPRESSOR) 25 MG tablet Take 1 tablet (25 mg total) by mouth 2 (two) times daily. 180 tablet 3   oxybutynin (DITROPAN XL) 5 MG 24 hr tablet Take 1 tablet (5 mg total) by mouth at bedtime. 7 tablet 0   oxyCODONE-acetaminophen (PERCOCET) 5-325 MG tablet Take 1 tablet by mouth every 4 (four) hours as needed for up to 18 doses. 18 tablet 0   pantoprazole (PROTONIX) 40 MG tablet TAKE 1 TABLET BY MOUTH EVERY DAY 90 tablet 1   RESTASIS 0.05 % ophthalmic emulsion Place 1 drop into the left eye 2 (two) times daily.     tadalafil (CIALIS) 5 MG tablet Take 5 mg by mouth daily as needed for  erectile dysfunction.      tamsulosin (FLOMAX) 0.4 MG CAPS capsule Take 1 capsule (0.4 mg total) by mouth daily. 10 capsule 0   valACYclovir (VALTREX) 500 MG tablet TAKE ONE TABLET BY MOUTH TWICE DAILY FOR 3 DAYS AS NEEDED FOR HERPES FLARE 180 tablet 0   vitamin E 400 UNIT capsule Take 400 Units by mouth daily.     Zinc 50 MG CAPS Take 50 mg by mouth daily.     No current facility-administered medications for this visit.    Physical Findings: In general this is a well appearing Caucasian male in no acute distress. He's alert and oriented x4 and appropriate throughout the examination. Cardiopulmonary assessment is negative for acute distress and he exhibits normal effort.   Lab Findings: Lab Results  Component Value Date    WBC 5.4 08/22/2020   HGB 16.6 08/22/2020   HCT 49 08/22/2020   MCV 88.7 03/01/2019   PLT 224 08/22/2020    Radiographic Findings:  Patient underwent CT imaging in our clinic for post implant dosimetry. The CT will be reviewed by Dr. Kathrynn Running to confirm there is an adequate distribution of radioactive seeds throughout the prostate gland and ensure that there are no seeds in or near the rectum.  We suspect the final radiation plan and dosimetry will show appropriate coverage of the prostate gland. He understands that we will call and inform him of any unexpected findings on further review of his imaging and dosimetry.  Impression/Plan: 76 y.o. gentleman with Stage T1c adenocarcinoma of the prostate with Gleason score of 3+4, and PSA of 2.91.  The patient is recovering from the effects of radiation. He has an indwelling foley catheter due to post-procedure urinary retention (changed in the ED 02/21/23). He is taking Oxybutynin, Flomax and Cialis daily as prescribed. We discussed stopping the oxybutynin for 48 hours prior to voiding trial but to continue taking the Flomax and Cialis daily until his follow up with Dr. Cardell Peach in 05/2023.  We also discussed the importance of keeping his BMs regular. We are hopeful that he will have a successful voiding trial on 03/03/23 and his urinary symptoms should gradually improve over the next 4-6 months. We talked about this today and he is otherwise pleased with his outcome. We also talked about long-term follow-up for prostate cancer following seed implant. He understands that ongoing PSA determinations and digital rectal exams will help perform surveillance to rule out disease recurrence. He has a follow up appointment scheduled with Buzzy Han, PA-C on 03/03/23 for voiding trial and then will follow up with Dr. Cardell Peach in 05/2023 for his first post-treatment PSA. He understands what to expect with his PSA measures. Patient was also educated today about some of the  long-term effects from radiation including a small risk for rectal bleeding and possibly erectile dysfunction. We talked about some of the general management approaches to these potential complications. However, I did encourage the patient to contact our office or return at any point if he has questions or concerns related to his previous radiation and prostate cancer.    Marguarite Arbour, PA-C

## 2023-02-22 NOTE — Progress Notes (Signed)
  Radiation Oncology         [phone] ________________________________  Name: Ryan [name] MRN: [id number]  Date: 02/23/2023  DOB: 06-20-1946  COMPLEX SIMULATION NOTE  NARRATIVE:  The patient was brought to the CT Simulation planning suite today following prostate seed implantation approximately one month ago.  Identity was confirmed.  All relevant records and images related to the planned course of therapy were reviewed.  Then, the patient was set-up supine.  CT images were obtained.  The CT images were loaded into the planning software.  Then the prostate and rectum were contoured.  Treatment planning then occurred.  The implanted iodine 125 seeds were identified by the physics staff for projection of radiation distribution  I have requested : 3D Simulation  I have requested a DVH of the following structures: Prostate and rectum.    ________________________________  Artist Pais Kathrynn Running, M.D.

## 2023-02-23 ENCOUNTER — Ambulatory Visit
Admission: RE | Admit: 2023-02-23 | Discharge: 2023-02-23 | Disposition: A | Discharge status: Home / Self Care | Payer: Medicare Other | Source: Ambulatory Visit | Attending: Urology | Admitting: Urology

## 2023-02-23 ENCOUNTER — Ambulatory Visit
Admission: RE | Admit: 2023-02-23 | Discharge: 2023-02-23 | Disposition: A | Discharge status: Home / Self Care | Payer: Medicare Other | Source: Ambulatory Visit | Attending: Radiation Oncology | Admitting: Radiation Oncology

## 2023-02-23 ENCOUNTER — Encounter: Payer: Self-pay | Admitting: Urology

## 2023-02-23 VITALS — BP 124/65 | HR 62 | Temp 97.3°F | Resp 18 | Ht 69.0 in | Wt 166.0 lb

## 2023-02-23 DIAGNOSIS — R339 Retention of urine, unspecified: Secondary | ICD-10-CM | POA: Insufficient documentation

## 2023-02-23 DIAGNOSIS — Z79899 Other long term (current) drug therapy: Secondary | ICD-10-CM | POA: Insufficient documentation

## 2023-02-23 DIAGNOSIS — Z79624 Long term (current) use of inhibitors of nucleotide synthesis: Secondary | ICD-10-CM | POA: Insufficient documentation

## 2023-02-23 DIAGNOSIS — Z79890 Hormone replacement therapy (postmenopausal): Secondary | ICD-10-CM | POA: Insufficient documentation

## 2023-02-23 DIAGNOSIS — C61 Malignant neoplasm of prostate: Secondary | ICD-10-CM | POA: Insufficient documentation

## 2023-02-23 DIAGNOSIS — Z923 Personal history of irradiation: Secondary | ICD-10-CM | POA: Insufficient documentation

## 2023-02-23 MED ORDER — OXYBUTYNIN CHLORIDE ER 5 MG PO TB24: 5.0000 mg | ORAL_TABLET | Freq: Every day | ORAL | 0 refills | Status: DC

## 2023-02-23 NOTE — Progress Notes (Signed)
Post-seed nursing interview for a diagnosis of Stage T1c adenocarcinoma of the prostate with Gleason score of 3+4, and PSA of 2.91.  Patient identity verified x2. Patient currently has a urinary catheter. Patient reports dysuria 10/10, fatigue, and polyuria. No other issues conveyed at this time.  Meaningful use complete.  I-PSS score- N/A- Current urinary catheter in place.  SHIM score- 14 Urinary Management medication(s) Tamsulosin Urology appointment date- 03/03/2023 with Dr. Cardell Peach at [facility] [place]  Vitals- BP 124/65 (BP Location: Left Arm, Patient Position: Sitting, Cuff Size: Normal)   Pulse 62   Temp (!) 97.3 F (36.3 C) (Temporal)   Resp 18   Ht 5\' 9"  (1.753 m)   Wt 166 lb (75.3 kg)   SpO2 96%   BMI 24.51 kg/m   This concludes the interaction.  Ruel Favors, LPN

## 2023-02-24 ENCOUNTER — Encounter: Payer: Self-pay | Admitting: *Deleted

## 2023-02-24 NOTE — Progress Notes (Signed)
Called pt to introduce Survivorship and checking to see if pt is able to urinate on his own since treatment. Pt has indwelling catheter at the present, but is scheduled to have a voiding trial on 03/03/23 with Ray Church at [place].

## 2023-02-28 ENCOUNTER — Telehealth: Payer: Self-pay

## 2023-02-28 ENCOUNTER — Telehealth: Payer: Self-pay | Admitting: *Deleted

## 2023-02-28 ENCOUNTER — Encounter: Payer: Self-pay | Admitting: Radiation Oncology

## 2023-02-28 DIAGNOSIS — C61 Malignant neoplasm of prostate: Secondary | ICD-10-CM | POA: Diagnosis not present

## 2023-02-28 DIAGNOSIS — Z191 Hormone sensitive malignancy status: Secondary | ICD-10-CM | POA: Diagnosis not present

## 2023-02-28 NOTE — Radiation Completion Notes (Addendum)
 Patient Name: Ryan [name], BARNA MRN: [id number] Date of Birth: 17-Mar-1946 Referring Physician: GARNETTE SHACK, M.D. Date of Service: 2023-02-28 Radiation Oncologist: Adina Barge, M.D. [facility] [place]                             RADIATION ONCOLOGY END OF TREATMENT NOTE     Diagnosis: C61 Malignant neoplasm of prostate Staging on 2022-06-21: Prostate cancer (HCC) T=cT1c, N=cN0, M=cM0 Intent: Curative     ==========DELIVERED PLANS==========  Prostate Seed Implant Date: 2023-01-31   Plan Name: Prostate Seed Implant Site: Prostate Technique: Radioactive Seed Implant I-125 Mode: Brachytherapy Dose Per Fraction: 145 Gy Prescribed Dose (Delivered / Prescribed): 145 Gy / 145 Gy Prescribed Fxs (Delivered / Prescribed): 1 / 1     ==========ON TREATMENT VISIT DATES========== 2023-01-31   The patient will continue follow up with just, Dr. SHACK, as well.  ------------------------------------------------   Donnice Barge, MD [facility]  Radiation Oncology Direct Dial: [phone]  Fax: [phone] [email or web address]  Skype  LinkedIn

## 2023-02-28 NOTE — Telephone Encounter (Signed)
Transition Care Management Unsuccessful Follow-up Telephone Call  Date of discharge and from where:  Ryan [name] 8/18  Attempts:  1st Attempt  Reason for unsuccessful TCM follow-up call:  No answer/busy   Lenard Forth [place]  [facility], [facility] Guide, Phone: [phone] Website: [email or web address]

## 2023-02-28 NOTE — Telephone Encounter (Signed)
RETURNED PATIENT'S PHONE CALL, SPOKE WITH PATIENT. ?

## 2023-02-28 NOTE — Progress Notes (Signed)
RN spoke with patient and answered questions related to tamsulosin and tadalafil.  No additional needs at this time.

## 2023-02-28 NOTE — Progress Notes (Signed)
  Radiation Oncology         [phone] ________________________________  Name: Ryan [name] MRN: [id number]  Date: 02/28/2023  DOB: July 25, 1945  3D Planning Note   Prostate Brachytherapy Post-Implant Dosimetry  Diagnosis: 77 y.o. gentleman with Stage T1c adenocarcinoma of the prostate with Gleason score of 3+4, and PSA of 2.91.   Narrative: On a previous date, Ryan [name] returned following prostate seed implantation for post implant planning. He underwent CT scan complex simulation to delineate the three-dimensional structures of the pelvis and demonstrate the radiation distribution.  Since that time, the seed localization, and complex isodose planning with dose volume histograms have now been completed.  Results:   Prostate Coverage - The dose of radiation delivered to the 90% or more of the prostate gland (D90) was 110.39% of the prescription dose. This exceeds our goal of greater than 90%. Rectal Sparing - The volume of rectal tissue receiving the prescription dose or higher was 0.0 cc. This falls under our thresholds tolerance of 1.0 cc.  Impression: The prostate seed implant appears to show adequate target coverage and appropriate rectal sparing.  Plan:  The patient will continue to follow with urology for ongoing PSA determinations. I would anticipate a high likelihood for local tumor control with minimal risk for rectal morbidity.  ________________________________  Artist Pais Kathrynn Running, M.D.

## 2023-03-01 ENCOUNTER — Telehealth: Payer: Self-pay

## 2023-03-01 NOTE — Telephone Encounter (Signed)
Transition Care Management Unsuccessful Follow-up Telephone Call  Date of discharge and from where:  Ryan [name] 8/18  Attempts:  2nd Attempt  Reason for unsuccessful TCM follow-up call:  No answer/busy   Ryan [name] [place]  [facility], [facility] Guide, Phone: [phone] Website: [email or web address]

## 2023-03-10 ENCOUNTER — Encounter: Payer: Self-pay | Admitting: *Deleted

## 2023-03-10 NOTE — Progress Notes (Signed)
Called to f/u with pt to see how voiding trial went at [facility] on Aug.29th. Pt said he was successful in being able to void on his own after a few failed attempts prior. Pt says he still has a great amount of burning and pain with voids. Pt is taking Advil and AZO for pain. Pt says he has to wait several minutes after each void to empty bladder completely. Pt was advised to call in a month to [facility] if the discomfort is not resolving. Pt will have PSA labs done in November at [facility]. This nurse will continue to follow-up with patient and schedule SCP appt once things are a little better.

## 2023-03-16 ENCOUNTER — Telehealth: Payer: Self-pay | Admitting: Family Medicine

## 2023-03-16 ENCOUNTER — Ambulatory Visit
Admission: RE | Admit: 2023-03-16 | Discharge: 2023-03-16 | Disposition: A | Discharge status: Home / Self Care | Payer: Medicare Other | Source: Ambulatory Visit | Attending: Radiation Oncology | Admitting: Radiation Oncology

## 2023-03-16 ENCOUNTER — Other Ambulatory Visit: Payer: Self-pay | Admitting: Urology

## 2023-03-16 ENCOUNTER — Other Ambulatory Visit: Payer: Self-pay

## 2023-03-16 ENCOUNTER — Telehealth: Payer: Self-pay

## 2023-03-16 DIAGNOSIS — R310 Gross hematuria: Secondary | ICD-10-CM | POA: Diagnosis not present

## 2023-03-16 DIAGNOSIS — C61 Malignant neoplasm of prostate: Secondary | ICD-10-CM

## 2023-03-16 LAB — URINALYSIS, COMPLETE (UACMP) WITH MICROSCOPIC
Bilirubin Urine: NEGATIVE
Glucose, UA: NEGATIVE mg/dL
Ketones, ur: NEGATIVE mg/dL
Nitrite: POSITIVE — AB
Protein, ur: 100 mg/dL — AB
RBC / HPF: >50 RBC/hpf (ref 0–5)
Specific Gravity, Urine: 1.014 (ref 1.005–1.030)
WBC, UA: >50 WBC/hpf (ref 0–5)
pH: 6 (ref 5.0–8.0)

## 2023-03-16 MED ORDER — PHENAZOPYRIDINE HCL 200 MG PO TABS: 200.0000 mg | ORAL_TABLET | Freq: Three times a day (TID) | ORAL | 0 refills | Status: DC | PRN

## 2023-03-16 MED ORDER — SULFAMETHOXAZOLE-TRIMETHOPRIM 800-160 MG PO TABS: 1.0000 | ORAL_TABLET | Freq: Two times a day (BID) | ORAL | 0 refills | Status: DC

## 2023-03-16 MED ORDER — TAMSULOSIN HCL 0.4 MG PO CAPS: 0.4000 mg | ORAL_CAPSULE | Freq: Every day | ORAL | 0 refills | Status: AC

## 2023-03-16 MED ORDER — OXYBUTYNIN CHLORIDE ER 5 MG PO TB24: 5.0000 mg | ORAL_TABLET | Freq: Every day | ORAL | 0 refills | Status: DC

## 2023-03-16 NOTE — Telephone Encounter (Signed)
RN called patient back to inform him that if he is unable to see his PCP for a urinalysis today to come in to the Cancer center and we will get urinalysis.  Refill order he requested will be sent to his pharmacy per [employer], PA-C.  Mr. Mccullick and wife was appreciative of the call and at this moment are waiting to heard back from PCP, but will keep [place] posted as to if they will come in for UA or not.  RN gave call back number and advised if they are coming to Cancer center they will needed to get her before 3:30 pm for lab.

## 2023-03-16 NOTE — Telephone Encounter (Signed)
Patient's wife called this morning to getting a urinalysis done per the Cancer Center's request.  She called back and states the Cancer Center is having him come there to get it done.  She said tell Dr. Caryl Never thank and that she really appreciates it a lot.

## 2023-03-16 NOTE — Telephone Encounter (Signed)
RN returned call patient has seed implant 01/31/2023 and was feeling some effects of dysuria, nocturia xq1-2 hrs, hematuria, and cloudy urine.  Discomfort is intense and not getting sleep.  Reports has been taking AZO along with his oxybutynin and Tamsulosin 0.8 mg with little effect.   Last seen in ED 02/03/2023 for urinary retention had catheter placed and on 02/20/2023 catheter was removed on 03/03/2023.  Reports is able to void on own.  Reports has been trying to get someone at [facility] office since Monday 03/14/2023 but no one has returned his calls.  Advised patient if he want to come in to give urine specimen and wife answered stating they have appointment at [facility] tomorrow 03/17/2023, but will call PCP to see if they can get in today for urinalysis.  RN advised to tell nurse at PCP office that it's urgent so they can be seen right away.  Wife asked if they can get refill on Tamsulosin and Oxybutynin.  Will inform Ashlyn Bruning, PA-C of refill request.

## 2023-03-16 NOTE — Telephone Encounter (Signed)
RN called patient back to update him on UA results and inform him of medications sent in for UTI and dysuria per Ashlyn Bruning, PA-C.  Rx for Pyridium to help with the dysuria but he should not take this with AZO. Just one or the other. Urinalysis looks like UTI so I am also sending an Rx for Septra DS that he should start today. We will call him with the final culture results when they are available likely Friday, to confirm he is on the correct antibiotic or make a change if necessary based on C&S.  Patient was very appreciative for the assistance today.

## 2023-03-16 NOTE — Telephone Encounter (Signed)
Requesting a urinalysis, says the Cancer Center is requesting to rule out infection

## 2023-03-16 NOTE — Telephone Encounter (Signed)
Please see most recent encounter

## 2023-03-16 NOTE — Telephone Encounter (Signed)
Noted  

## 2023-03-18 ENCOUNTER — Telehealth: Payer: Self-pay

## 2023-03-18 ENCOUNTER — Other Ambulatory Visit: Payer: Self-pay | Admitting: Urology

## 2023-03-18 LAB — URINE CULTURE: Culture: >=100000 — AB

## 2023-03-18 MED ORDER — CIPROFLOXACIN HCL 500 MG PO TABS: 500.0000 mg | ORAL_TABLET | Freq: Two times a day (BID) | ORAL | 0 refills | Status: DC

## 2023-03-18 NOTE — Telephone Encounter (Signed)
RN called patient per Ryan Fennel, PA-C to inform him of urine culture results show that the particular bacteria causing his UTI is not sensitive to Septra so she will send a Rx for Cipro to his pharmacy to start today. He should discontinue the Septra DS but continue taking the tamsulosin, oxybutynin and Pyridium prn.  Wife said they were appreciative of the call and will pick up new medication and start today.

## 2023-03-22 ENCOUNTER — Encounter: Payer: Self-pay | Admitting: Family Medicine

## 2023-03-22 DIAGNOSIS — C61 Malignant neoplasm of prostate: Secondary | ICD-10-CM

## 2023-03-23 ENCOUNTER — Encounter: Payer: Self-pay | Admitting: Family Medicine

## 2023-03-23 ENCOUNTER — Telehealth (INDEPENDENT_AMBULATORY_CARE_PROVIDER_SITE_OTHER): Payer: Medicare Other | Admitting: Family Medicine

## 2023-03-23 VITALS — Wt 160.0 lb

## 2023-03-23 DIAGNOSIS — R197 Diarrhea, unspecified: Secondary | ICD-10-CM

## 2023-03-23 DIAGNOSIS — C61 Malignant neoplasm of prostate: Secondary | ICD-10-CM | POA: Diagnosis not present

## 2023-03-23 DIAGNOSIS — N3289 Other specified disorders of bladder: Secondary | ICD-10-CM

## 2023-03-23 NOTE — Progress Notes (Unsigned)
Patient ID: Ryan [name], male   DOB: 1945/12/04, 77 y.o.   MRN: [id number]   Virtual Visit via Video Note  I connected with Ryan [name] on 03/23/23 at  5:00 PM EDT by a video enabled telemedicine application and verified that I am speaking with the correct person using two identifiers.  Location patient: home Location provider:work or home office Persons participating in the virtual visit: patient, provider  I discussed the limitations of evaluation and management by telemedicine and the availability of in person appointments. The patient expressed understanding and agreed to proceed.   HPI: Ryan [name] has history of hypertension, hypothyroidism, prostate cancer, hyperlipidemia.  He has been followed by urology for years.  He had recent brachytherapy with radiation seed implants July 29.  He was discharged and August 1 had decreased urine flow and had catheter placed.  He had some ongoing pain and concern for possible infection.  Was eventually seen August 18 in the ER and catheter was removed and they had some difficulties reinserting catheter.  Eventually urologist was able to get smaller caliber catheter in place.  He continued to have some pain.  Was seen back in follow-up August 29 and urologist removed catheter.  He had some ongoing pain with urination.  He states he left a message with his urologist on the ninth of this month regarding his ongoing pain and does not have follow-up scheduled visit until the 24th.  He did not get any response from urology office and consulted with his oncologist on the 11th and urinalysis was done which suggested possible infection.  Subsequent culture grew out Pseudomonas species.  Was initially placed on Septra prior to culture return and then switched to Cipro 6 days ago.  He is having some diarrhea at this time.  Still having some slow urinary stream and intermittent suspected bladder spasms.  His spasms hit several times per day.  He suspects he may have  some urethral stricture.  He is taking tamsulosin, oxybutynin, and Pyridium regularly without much improvement.  As above, he does have scheduled follow-up with his urologist September 24.  He had sent message earlier in the week requesting referral to [facility] and has pending appointment there October 3.  No reported fevers at this time.  Past Medical History:  Diagnosis Date   Arthritis 03/28/2013   Back pain    Cancer (HCC) 2014   prostate cancer   Cataracts, bilateral    Dysrhythmia    occasional PAC- asymptomatic, per MD pt cut back on caffeine   ED (erectile dysfunction) 12/18/2021   due to arterial insufficency   Fungal nail infection    GERD (gastroesophageal reflux disease)    Hearing loss    wear hearing aids   HSV infection 2016   Hyperlipidemia    Hypertension    no meds, dx yrs ago per patient but normal last several yrs   Hypertensive retinopathy    OU   Hypothyroidism    Personal history of other diseases of the digestive system    Personal history of other diseases of the nervous system and sense organs 2014   Personal history of other endocrine, nutritional and metabolic disease 4854   Pneumonia    x 1   Primary testicular hypogonadism 2017   Retarded ejaculation 2021   Sleep apnea 2014   history of, did not qualify for cpap   Past Surgical History:  Procedure Laterality Date   ankle surgery trauma Right    CATARACT EXTRACTION  Left 11/29/2018   Dr. Nile Riggs   COLONOSCOPY     COSMETIC SURGERY     loose skin removed around neck area   CYSTOSCOPY N/A 01/31/2023   Procedure: CYSTOSCOPY;  Surgeon: Jannifer Hick, MD;  Location: [facility];  Service: Urology;  Laterality: N/A;   eye lid surgery     EYE SURGERY     fungal nail     LASIK Bilateral    PARS PLANA VITRECTOMY Left 03/01/2019   Procedure: PARS PLANA VITRECTOMY WITH 25 GAUGE;  Surgeon: Rennis Chris, MD;  Location: [facility] OR;  Service: Ophthalmology;  Laterality: Left;   PROSTATE  BIOPSY  06/21/2022   2020, 2018   RADIOACTIVE SEED IMPLANT N/A 01/31/2023   Procedure: RADIOACTIVE SEED IMPLANT/BRACHYTHERAPY IMPLANT;  Surgeon: Jannifer Hick, MD;  Location[facility];  Service: Urology;  Laterality: N/A;  90 MINUTES NEEDED FOR CASE   REMOVAL RETAINED LENS Left 03/01/2019   Procedure: REMOVAL OF RETAINED LENS MATERIALS LEFT EYE;  Surgeon: Rennis Chris, MD;  Location: [facility] OR;  Service: Ophthalmology;  Laterality: Left;   SPACE OAR INSTILLATION N/A 01/31/2023   Procedure: SPACE OAR INSTILLATION;  Surgeon: Jannifer Hick, MD;  Location: [facility];  Service: Urology;  Laterality: N/A;   VASECTOMY  1981   WISDOM TOOTH EXTRACTION      reports that he quit smoking about 41 years ago. His smoking use included cigarettes. He has never used smokeless tobacco. He reports current alcohol use of about 3.0 - 4.0 standard drinks of alcohol per week. He reports that he does not use drugs. family history includes Alcohol abuse in his father; Arthritis in his sister; COPD in his sister; Cancer in his father; Heart disease in his mother; Liver disease in his father. Allergies  Allergen Reactions   Bee Venom     Other reaction(s): Drowsy   Dust Mite Extract     UNSPECIFIED REACTION    Grass Extracts [Gramineae Pollens]     UNSPECIFIED REACTION       ROS: See pertinent positives and negatives per HPI.  Past Medical History:  Diagnosis Date   Arthritis 03/28/2013   Back pain    Cancer (HCC) 2014   prostate cancer   Cataracts, bilateral    Dysrhythmia    occasional PAC- asymptomatic, per MD pt cut back on caffeine   ED (erectile dysfunction) 12/18/2021   due to arterial insufficency   Fungal nail infection    GERD (gastroesophageal reflux disease)    Hearing loss    wear hearing aids   HSV infection 2016   Hyperlipidemia    Hypertension    no meds, dx yrs ago per patient but normal last several yrs   Hypertensive retinopathy    OU    Hypothyroidism    Personal history of other diseases of the digestive system    Personal history of other diseases of the nervous system and sense organs 2014   Personal history of other endocrine, nutritional and metabolic disease 4270   Pneumonia    x 1   Primary testicular hypogonadism 2017   Retarded ejaculation 2021   Sleep apnea 2014   history of, did not qualify for cpap    Past Surgical History:  Procedure Laterality Date   ankle surgery trauma Right    CATARACT EXTRACTION Left 11/29/2018   Dr. Nile Riggs   COLONOSCOPY     COSMETIC SURGERY     loose skin removed around neck area  CYSTOSCOPY N/A 01/31/2023   Procedure: CYSTOSCOPY;  Surgeon: Jannifer Hick, MD;  Location: [facility];  Service: Urology;  Laterality: N/A;   eye lid surgery     EYE SURGERY     fungal nail     LASIK Bilateral    PARS PLANA VITRECTOMY Left 03/01/2019   Procedure: PARS PLANA VITRECTOMY WITH 25 GAUGE;  Surgeon: Rennis Chris, MD;  Location: [facility] OR;  Service: Ophthalmology;  Laterality: Left;   PROSTATE BIOPSY  06/21/2022   2020, 2018   RADIOACTIVE SEED IMPLANT N/A 01/31/2023   Procedure: RADIOACTIVE SEED IMPLANT/BRACHYTHERAPY IMPLANT;  Surgeon: Jannifer Hick, MD;  Location: [facility];  Service: Urology;  Laterality: N/A;  90 MINUTES NEEDED FOR CASE   REMOVAL RETAINED LENS Left 03/01/2019   Procedure: REMOVAL OF RETAINED LENS MATERIALS LEFT EYE;  Surgeon: Rennis Chris, MD;  Location: [facility] OR;  Service: Ophthalmology;  Laterality: Left;   SPACE OAR INSTILLATION N/A 01/31/2023   Procedure: SPACE OAR INSTILLATION;  Surgeon: Jannifer Hick, MD;  Location: [facility];  Service: Urology;  Laterality: N/A;   VASECTOMY  1981   WISDOM TOOTH EXTRACTION      Family History  Problem Relation Age of Onset   Heart disease Mother    Liver disease Father    Alcohol abuse Father    Cancer Father        hepatic cancer ?primary   Arthritis Sister    COPD Sister      SOCIAL HX: Non-smoker   Current Outpatient Medications:    Ascorbic Acid (VITAMIN C) 1000 MG tablet, Take 1,000 mg by mouth daily., Disp: , Rfl:    atorvastatin (LIPITOR) 20 MG tablet, Take 1 tablet (20 mg total) by mouth daily., Disp: 90 tablet, Rfl: 3   Cholecalciferol (VITAMIN D) 1000 UNITS capsule, Take 1,000 Units by mouth daily., Disp: , Rfl:    ciprofloxacin (CIPRO) 500 MG tablet, Take 1 tablet (500 mg total) by mouth 2 (two) times daily., Disp: 20 tablet, Rfl: 0   Coenzyme Q10 100 MG capsule, Take 100 mg by mouth daily., Disp: , Rfl:    Cyanocobalamin (VITAMIN B-12 CR) 1500 MCG TBCR, Take 1,500 mcg by mouth daily., Disp: , Rfl:    docusate sodium (COLACE) 100 MG capsule, Take 1 capsule (100 mg total) by mouth daily as needed for up to 30 doses., Disp: 30 capsule, Rfl: 0   EPINEPHrine 0.3 mg/0.3 mL IJ SOAJ injection, INJECT 0.3 ML INTRAMUSCULARLY ONCE AS NEEDED AS DIRECTED, Disp: , Rfl:    fluticasone (FLONASE) 50 MCG/ACT nasal spray, Place 1 spray into both nostrils daily as needed for allergies or rhinitis., Disp: , Rfl:    levothyroxine (SYNTHROID) 50 MCG tablet, TAKE ONE TABLET BY MOUTH DAILY FOR HYPOTHYROIDISM, Disp: , Rfl:    liothyronine (CYTOMEL) 5 MCG tablet, Take 5 mcg by mouth daily., Disp: , Rfl:    loratadine (CLARITIN) 10 MG tablet, Take 10 mg by mouth daily as needed for allergies. , Disp: , Rfl:    MAGNESIUM CITRATE PO, Take 400 mg by mouth daily. , Disp: , Rfl:    metoprolol tartrate (LOPRESSOR) 25 MG tablet, Take 1 tablet (25 mg total) by mouth 2 (two) times daily., Disp: 180 tablet, Rfl: 3   oxybutynin (DITROPAN XL) 5 MG 24 hr tablet, Take 1 tablet (5 mg total) by mouth at bedtime., Disp: 30 tablet, Rfl: 0   pantoprazole (PROTONIX) 40 MG tablet, TAKE 1 TABLET BY MOUTH EVERY  DAY, Disp: 90 tablet, Rfl: 1   phenazopyridine (PYRIDIUM) 200 MG tablet, Take 1 tablet (200 mg total) by mouth 3 (three) times daily as needed for pain., Disp: 30 tablet, Rfl: 0   RESTASIS 0.05  % ophthalmic emulsion, Place 1 drop into the left eye 2 (two) times daily., Disp: , Rfl:    tadalafil (CIALIS) 5 MG tablet, Take 5 mg by mouth daily as needed for erectile dysfunction. , Disp: , Rfl:    tamsulosin (FLOMAX) 0.4 MG CAPS capsule, Take 1 capsule (0.4 mg total) by mouth daily., Disp: 30 capsule, Rfl: 0   valACYclovir (VALTREX) 500 MG tablet, TAKE ONE TABLET BY MOUTH TWICE DAILY FOR 3 DAYS AS NEEDED FOR HERPES FLARE, Disp: 180 tablet, Rfl: 0   vitamin E 400 UNIT capsule, Take 400 Units by mouth daily., Disp: , Rfl:    Zinc 50 MG CAPS, Take 50 mg by mouth daily., Disp: , Rfl:   EXAM:  VITALS per patient if applicable:  GENERAL: alert, oriented, appears well and in no acute distress  HEENT: atraumatic, conjunttiva clear, no obvious abnormalities on inspection of external nose and ears  NECK: normal movements of the head and neck  LUNGS: on inspection no signs of respiratory distress, breathing rate appears normal, no obvious gross SOB, gasping or wheezing  CV: no obvious cyanosis  MS: moves all visible extremities without noticeable abnormality  PSYCH/NEURO: pleasant and cooperative, no obvious depression or anxiety, speech and thought processing grossly intact  ASSESSMENT AND PLAN:  Discussed the following assessment and plan:  77 year old male with history of prostate cancer with recent radiation seed implants July 29.  He has had some issues with urinary retention and had prolonged catheter for about 29 days.  Currently is able to urinate without catheter but having ongoing pain with urination and suspected bladder spasms.  Was also recently diagnosed as above with Pseudomonas UTI and currently on Cipro.  Has developed some diarrhea which may be direct side effect from Cipro.  We did mention possibility of C. difficile if diarrhea persist  -Stressed importance of adequate hydration -Continue tamsulosin and Pyridium as instructed by urology -He is encouraged to keep his  follow-up appointment September 24 with his urologist here. -consider C dif PCR if diarrhea persists. -follow up immediately for any fever.        I discussed the assessment and treatment plan with the patient. The patient was provided an opportunity to ask questions and all were answered. The patient agreed with the plan and demonstrated an understanding of the instructions.   The patient was advised to call back or seek an in-person evaluation if the symptoms worsen or if the condition fails to improve as anticipated.     Evelena Peat, MD

## 2023-03-25 ENCOUNTER — Telehealth: Payer: Self-pay

## 2023-03-25 NOTE — Telephone Encounter (Signed)
RN received call from patient wanted to give update after having positive results of UTI.  Reports completed antibiotic therapy and feels good, has some dysuria and urinary frequency but not as bad as before.  Reports is taking Pyridium helps but totally getting rid of the discomfort but is tolerable takes Advil 4 tabs as needed for pain.  Reports it's been 2 weeks since he first reached out to [facility] and no one has responded to his call.  He is grateful to our staff for helping him with his urinary issues and is happy that we were able to get him the care and medication needed for UTI.  Patient reports has appointment with [facility] on Tuesday 03/29/2023.  Very appreciative to the radiation staff.

## 2023-03-29 DIAGNOSIS — R300 Dysuria: Secondary | ICD-10-CM | POA: Diagnosis not present

## 2023-04-07 ENCOUNTER — Other Ambulatory Visit: Payer: Self-pay | Admitting: Adult Health

## 2023-04-07 ENCOUNTER — Telehealth: Payer: Self-pay | Admitting: Family Medicine

## 2023-04-07 DIAGNOSIS — R197 Diarrhea, unspecified: Secondary | ICD-10-CM

## 2023-04-07 DIAGNOSIS — R300 Dysuria: Secondary | ICD-10-CM | POA: Diagnosis not present

## 2023-04-07 DIAGNOSIS — C61 Malignant neoplasm of prostate: Secondary | ICD-10-CM | POA: Diagnosis not present

## 2023-04-07 NOTE — Telephone Encounter (Signed)
Spoke with the patient's wife, informed her of the message below and she agreed to come pick up the kit.

## 2023-04-07 NOTE — Telephone Encounter (Signed)
Message sent to [facility] for review.

## 2023-04-07 NOTE — Telephone Encounter (Signed)
Mrs Lama called back and stated the patient had a virtual visit 1 week ago with Dr Caryl Never, he is aware of the patients condition and was referred to urology, due to issues since his cancer treatment.  Stated the patient was given Rx for Cipro, has had recurrent diarrhea and Dr Caryl Never told him he may need to be tested for C-diff.  Stated the patient had a visit with the urologist and he advised the same test.  Mrs Granville is aware the message will be sent to Dr Caryl Never to review on Monday when he returns to the office.

## 2023-04-07 NOTE — Telephone Encounter (Signed)
Patient came in and would like CDIFF labs ordered.  Please advise at [phone]

## 2023-04-07 NOTE — Telephone Encounter (Signed)
I left a detailed message at the patient's home number requesting the reason he is asking for the lab test below as PCP is out of the office.  Message was left for the patient to call back and schedule an appointment if he is sick today.

## 2023-04-08 ENCOUNTER — Other Ambulatory Visit: Payer: Medicare Other

## 2023-04-11 ENCOUNTER — Other Ambulatory Visit: Payer: Medicare Other

## 2023-04-11 DIAGNOSIS — R197 Diarrhea, unspecified: Secondary | ICD-10-CM | POA: Diagnosis not present

## 2023-04-14 NOTE — Progress Notes (Signed)
CARDIOLOGY CONSULT NOTE       Patient ID: Ryan [name] MRN: [id number] DOB/AGE: 75-03-1946 75 y.o.  Admit date: (Not on file) Referring Physician: Clifton Custard Primary Physician: Kristian Covey, MD Primary Cardiologist: Eden Emms Reason for Consultation: Tachycardia   HPI:  77 y.o. referred by Dr Helayne Seminole for tachycardia. First seen by me 08/04/22 Patient has noted elevated HRls for a few months. He works out at gym and notes HR can remain elevated for 2-4 hours. Zio monitor ordered by primary reviewed and no dangerous arrhythmias. Some SVT but longest only 11 seconds and ? Atrial tachycardia. TTE done 06/09/22 showed normal EF and mild AR. ETT showed no pre excitation and no ischemia. He is retired from [employer] where he met his current wife.  He sees Dr Sander Radon but gets a lot of care at [place] He is retired Retail banker. Denies chest pain dyspnea, or syncope Active at gym and doing yard work   Concerned about beta blockers and EF Was started on lopressor   Coronary calcium score 267 which was 54 th percentile for age/sex Has some myalgias with lipitor LDL had been 133 and taking statin daily down to 101  Discussed getting updated labs  Still working out at [employer] Can get HR up to 148 but baseline nice at 65. He has 3 children and wife had two when they met all are fairly local   Prostate cancer. Rx brachyRx July 2024 Urinary retention with need for foley and Pseudomonas infection Rx Cipro Using flomax,oxybutynin and Pyridium Switched from [facility] to Dr Earlene Plater with [facility] and had adhesions lysed   Has not been very compliant with his higher dose lipitor due to urology issues   ROS All other systems reviewed and negative except as noted above  Past Medical History:  Diagnosis Date   Arthritis 03/28/2013   Back pain    Cancer (HCC) 2014   prostate cancer   Cataracts, bilateral    Dysrhythmia    occasional PAC- asymptomatic, per MD pt cut back on  caffeine   ED (erectile dysfunction) 12/18/2021   due to arterial insufficency   Fungal nail infection    GERD (gastroesophageal reflux disease)    Hearing loss    wear hearing aids   HSV infection 2016   Hyperlipidemia    Hypertension    no meds, dx yrs ago per patient but normal last several yrs   Hypertensive retinopathy    OU   Hypothyroidism    Personal history of other diseases of the digestive system    Personal history of other diseases of the nervous system and sense organs 2014   Personal history of other endocrine, nutritional and metabolic disease 4098   Pneumonia    x 1   Primary testicular hypogonadism 2017   Retarded ejaculation 2021   Sleep apnea 2014   history of, did not qualify for cpap    Family History  Problem Relation Age of Onset   Heart disease Mother    Liver disease Father    Alcohol abuse Father    Cancer Father        hepatic cancer ?primary   Arthritis Sister    COPD Sister     Social History   Socioeconomic History   Marital status: Married    Spouse name: Not on file   Number of children: Not on file   Years of education: Not on file   Highest education level: Associate degree:  occupational, Scientist, product/process development, or vocational program  Occupational History   Not on file  Tobacco Use   Smoking status: Former    Current packs/day: 0.00    Types: Cigarettes    Quit date: 07/05/1981    Years since quitting: 41.8   Smokeless tobacco: Never  Vaping Use   Vaping status: Never Used  Substance and Sexual Activity   Alcohol use: Yes    Alcohol/week: 3.0 - 4.0 standard drinks of alcohol    Types: 3 - 4 Glasses of wine per week   Drug use: No   Sexual activity: Not on file  Other Topics Concern   Not on file  Social History Narrative   Not on file   Social Determinants of Health   Financial Resource Strain: Low Risk  (12/29/2022)   Overall Financial Resource Strain (CARDIA)    Difficulty of Paying Living Expenses: Not hard at all  Food  Insecurity: Low Risk  (04/07/2023)   Received from [facility]   Hunger Vital Sign    Worried About Running Out of Food in the Last Year: Never true    Ran Out of Food in the Last Year: Never true  Transportation Needs: No Transportation Needs (04/07/2023)   Received from [facility]    In the past 12 months, has lack of reliable transportation kept you from medical appointments, meetings, work or from getting things needed for daily living? : No  Physical Activity: Sufficiently Active (12/29/2022)   Exercise Vital Sign    Days of Exercise per Week: 3 days    Minutes of Exercise per Session: 90 min  Stress: No Stress Concern Present (12/29/2022)   [employer] of Occupational Health - Occupational Stress Questionnaire    Feeling of Stress : Not at all  Social Connections: Socially Integrated (12/29/2022)   Social Connection and Isolation Panel [NHANES]    Frequency of Communication with Friends and Family: Twice a week    Frequency of Social Gatherings with Friends and Family: Twice a week    Attends Religious Services: 1 to 4 times per year    Active Member of [employer] or Organizations: Yes    Attends Banker Meetings: 1 to 4 times per year    Marital Status: Married  Catering manager Violence: Not At Risk (02/23/2023)   Humiliation, Afraid, Rape, and Kick questionnaire    Fear of Current or Ex-Partner: No    Emotionally Abused: No    Physically Abused: No    Sexually Abused: No    Past Surgical History:  Procedure Laterality Date   ankle surgery trauma Right    CATARACT EXTRACTION Left 11/29/2018   Dr. Nile Riggs   COLONOSCOPY     COSMETIC SURGERY     loose skin removed around neck area   CYSTOSCOPY N/A 01/31/2023   Procedure: CYSTOSCOPY;  Surgeon: Jannifer Hick, MD;  Location: [facility];  Service: Urology;  Laterality: N/A;   eye lid surgery     EYE SURGERY     fungal nail     LASIK Bilateral    PARS PLANA VITRECTOMY Left  03/01/2019   Procedure: PARS PLANA VITRECTOMY WITH 25 GAUGE;  Surgeon: Rennis Chris, MD;  Location: [facility] OR;  Service: Ophthalmology;  Laterality: Left;   PROSTATE BIOPSY  06/21/2022   2020, 2018   RADIOACTIVE SEED IMPLANT N/A 01/31/2023   Procedure: RADIOACTIVE SEED IMPLANT/BRACHYTHERAPY IMPLANT;  Surgeon: Jannifer Hick, MD;  Location: [facility];  Service: Urology;  Laterality: N/A;  90 MINUTES NEEDED FOR CASE   REMOVAL RETAINED LENS Left 03/01/2019   Procedure: REMOVAL OF RETAINED LENS MATERIALS LEFT EYE;  Surgeon: Rennis Chris, MD;  Location: [facility] OR;  Service: Ophthalmology;  Laterality: Left;   SPACE OAR INSTILLATION N/A 01/31/2023   Procedure: SPACE OAR INSTILLATION;  Surgeon: Jannifer Hick, MD;  Location: [facility];  Service: Urology;  Laterality: N/A;   VASECTOMY  1981   WISDOM TOOTH EXTRACTION        Current Outpatient Medications:    Ascorbic Acid (VITAMIN C) 1000 MG tablet, Take 1,000 mg by mouth daily., Disp: , Rfl:    atorvastatin (LIPITOR) 20 MG tablet, Take 1 tablet (20 mg total) by mouth daily., Disp: 90 tablet, Rfl: 3   Cholecalciferol (VITAMIN D) 1000 UNITS capsule, Take 1,000 Units by mouth daily., Disp: , Rfl:    Coenzyme Q10 100 MG capsule, Take 100 mg by mouth daily., Disp: , Rfl:    Cyanocobalamin (VITAMIN B-12 CR) 1500 MCG TBCR, Take 1,500 mcg by mouth daily., Disp: , Rfl:    EPINEPHrine 0.3 mg/0.3 mL IJ SOAJ injection, INJECT 0.3 ML INTRAMUSCULARLY ONCE AS NEEDED AS DIRECTED, Disp: , Rfl:    fluticasone (FLONASE) 50 MCG/ACT nasal spray, Place 1 spray into both nostrils daily as needed for allergies or rhinitis., Disp: , Rfl:    levothyroxine (SYNTHROID) 50 MCG tablet, TAKE ONE TABLET BY MOUTH DAILY FOR HYPOTHYROIDISM, Disp: , Rfl:    liothyronine (CYTOMEL) 5 MCG tablet, Take 5 mcg by mouth daily., Disp: , Rfl:    loratadine (CLARITIN) 10 MG tablet, Take 10 mg by mouth daily as needed for allergies. , Disp: , Rfl:    MAGNESIUM CITRATE  PO, Take 400 mg by mouth daily. , Disp: , Rfl:    metoprolol tartrate (LOPRESSOR) 25 MG tablet, Take 1 tablet (25 mg total) by mouth 2 (two) times daily., Disp: 180 tablet, Rfl: 3   oxybutynin (DITROPAN XL) 5 MG 24 hr tablet, Take 1 tablet (5 mg total) by mouth at bedtime., Disp: 30 tablet, Rfl: 0   pantoprazole (PROTONIX) 40 MG tablet, TAKE 1 TABLET BY MOUTH EVERY DAY, Disp: 90 tablet, Rfl: 1   phenazopyridine (PYRIDIUM) 200 MG tablet, Take 1 tablet (200 mg total) by mouth 3 (three) times daily as needed for pain., Disp: 30 tablet, Rfl: 0   RESTASIS 0.05 % ophthalmic emulsion, Place 1 drop into the left eye 2 (two) times daily., Disp: , Rfl:    tadalafil (CIALIS) 5 MG tablet, Take 5 mg by mouth daily as needed for erectile dysfunction. , Disp: , Rfl:    tamsulosin (FLOMAX) 0.4 MG CAPS capsule, Take 1 capsule (0.4 mg total) by mouth daily., Disp: 30 capsule, Rfl: 0   valACYclovir (VALTREX) 500 MG tablet, TAKE ONE TABLET BY MOUTH TWICE DAILY FOR 3 DAYS AS NEEDED FOR HERPES FLARE, Disp: 180 tablet, Rfl: 0   vitamin E 400 UNIT capsule, Take 400 Units by mouth daily., Disp: , Rfl:    Zinc 50 MG CAPS, Take 50 mg by mouth daily., Disp: , Rfl:    ciprofloxacin (CIPRO) 500 MG tablet, Take 1 tablet (500 mg total) by mouth 2 (two) times daily. (Patient not taking: Reported on 04/26/2023), Disp: 20 tablet, Rfl: 0   docusate sodium (COLACE) 100 MG capsule, Take 1 capsule (100 mg total) by mouth daily as needed for up to 30 doses. (Patient not taking: Reported on 04/26/2023), Disp: 30 capsule, Rfl: 0  Physical Exam: BP 110/60   Pulse 64   Ht 5\' 8"  (1.727 m)   Wt 160 lb (72.6 kg)   SpO2 94%   BMI 24.33 kg/m    Affect appropriate Healthy:  appears stated age HEENT: hearing aids in place  Neck supple with no adenopathy JVP normal no bruits no thyromegaly Lungs clear with no wheezing and good diaphragmatic motion Heart:  S1/S2 no murmur, no rub, gallop or click PMI normal Abdomen: benighn, BS  positve, no tenderness, no AAA no bruit.  No HSM or HJR Distal pulses intact with no bruits No edema Neuro non-focal Skin warm and dry No muscular weakness   Labs:   Lab Results  Component Value Date   WBC 5.4 08/22/2020   HGB 16.6 08/22/2020   HCT 49 08/22/2020   MCV 88.7 03/01/2019   PLT 224 08/22/2020   No results for input(s): "NA", "K", "CL", "CO2", "BUN", "CREATININE", "CALCIUM", "PROT", "BILITOT", "ALKPHOS", "ALT", "AST", "GLUCOSE" in the last 168 hours.  Invalid input(s): "LABALBU" No results found for: "CKTOTAL", "CKMB", "CKMBINDEX", "TROPONINI"  Lab Results  Component Value Date   CHOL 197 11/04/2022   CHOL 193 08/22/2020   CHOL 198 03/01/2018   Lab Results  Component Value Date   HDL 68 11/04/2022   HDL 77 (A) 08/22/2020   HDL 65.50 03/01/2018   Lab Results  Component Value Date   LDLCALC 110 (H) 11/04/2022   LDLCALC 90 08/22/2020   LDLCALC 109 (H) 03/01/2018   Lab Results  Component Value Date   TRIG 107 11/04/2022   TRIG 130 08/22/2020   TRIG 116.0 03/01/2018   Lab Results  Component Value Date   CHOLHDL 2.9 11/04/2022   CHOLHDL 3 03/01/2018   CHOLHDL 3 08/20/2016   Lab Results  Component Value Date   LDLDIRECT 122.5 07/02/2013   LDLDIRECT 122.8 01/01/2013   LDLDIRECT 173.8 04/28/2012      Radiology: No results found.  EKG: NSR normal    ASSESSMENT AND PLAN:   Tachcyardia:  benign monitor with self limited SVT/atrial tachycardia. Continue lopressor TE with normal EF and normal ETT HLD:  on statin high calcium score for age check labs likely try to increase lipitor to 20 mg daily Synthroid:  on synthroid replacement f/u labs with primary  Prostate:  cancer post brachyRx complicated by retention and infection  F/U with Dr Earlene Plater now   Lipitor 20 mg Lipid/Liver 3 months   F/U in 6 months   Signed: Charlton Haws 04/26/2023, 10:29 AM

## 2023-04-15 ENCOUNTER — Telehealth: Payer: Self-pay | Admitting: Family Medicine

## 2023-04-15 NOTE — Telephone Encounter (Signed)
Spoke with patient regarding results/recommendations.  

## 2023-04-15 NOTE — Telephone Encounter (Signed)
Pt would like stool result

## 2023-04-16 LAB — CDIFF NAA+O+P+STOOL CULTURE
E coli, Shiga toxin Assay: NEGATIVE
Toxigenic C. Difficile by PCR: NEGATIVE

## 2023-04-17 ENCOUNTER — Other Ambulatory Visit: Payer: Self-pay | Admitting: Urology

## 2023-04-20 ENCOUNTER — Other Ambulatory Visit: Payer: Self-pay | Admitting: Urology

## 2023-04-20 NOTE — Telephone Encounter (Signed)
Refill request should be directed to Dr. Gaynelle Arabian at [facility].

## 2023-04-20 NOTE — Telephone Encounter (Signed)
Patient is now under the care of Dr. Gaynelle Arabian, [facility]. Please request refills through him.

## 2023-04-21 DIAGNOSIS — R300 Dysuria: Secondary | ICD-10-CM | POA: Diagnosis not present

## 2023-04-21 DIAGNOSIS — C61 Malignant neoplasm of prostate: Secondary | ICD-10-CM | POA: Diagnosis not present

## 2023-04-21 DIAGNOSIS — N320 Bladder-neck obstruction: Secondary | ICD-10-CM | POA: Diagnosis not present

## 2023-04-22 ENCOUNTER — Telehealth: Payer: Self-pay | Admitting: Internal Medicine

## 2023-04-22 ENCOUNTER — Other Ambulatory Visit: Payer: Self-pay

## 2023-04-22 NOTE — Telephone Encounter (Signed)
Spoke with pts wife and she is aware of recommendations. Script called into pharmacy. Order in epic for lab. Asked pt to call back on Monday with an update. Wife verbalized understanding.

## 2023-04-22 NOTE — Telephone Encounter (Signed)
Please advise patient to do stat C.diff. Send Rx for Lomotil 1 tab q8h prn. Start Lomotil if C.diff negative. Use Benefiber 1-2 tablespoon TID with meals

## 2023-04-22 NOTE — Telephone Encounter (Signed)
Inbound call from patient's wife, states patient is experieicning diarrhea due to a medication prescribed by his oncologist. They advised patient to call us and advise if we knew of any antibiotic that wouldn't cause diarrhea. Please advise.

## 2023-04-22 NOTE — Telephone Encounter (Signed)
Ryan [name] had Rad seed implants for prostate ca the end of July. He had a catheter for 30 days and was on cipro for UTI. Yesterday he had a cystoscope with Dr. Earlene [name] and was placed on Septra. Dr. Earlene [name] told him he should contact GI to see if we know of an antibiotic he could take that would not cause diarrhea. [name] was tested for cdiff on 10/7 by PCP and was negative. He has a soft stool in the am, then a looser one and then just water. Passing 3-4 stools per day. They have tried Imodium, fiber, and nothing seems to help. Please advise as DOD.

## 2023-04-26 ENCOUNTER — Encounter: Payer: Self-pay | Admitting: Cardiovascular Disease

## 2023-04-26 ENCOUNTER — Ambulatory Visit: Payer: Medicare Other | Attending: Cardiovascular Disease | Admitting: Cardiovascular Disease

## 2023-04-26 VITALS — BP 110/60 | HR 64 | Ht 68.0 in | Wt 160.0 lb

## 2023-04-26 DIAGNOSIS — E785 Hyperlipidemia, unspecified: Secondary | ICD-10-CM | POA: Diagnosis not present

## 2023-04-26 DIAGNOSIS — I471 Supraventricular tachycardia, unspecified: Secondary | ICD-10-CM | POA: Insufficient documentation

## 2023-04-26 DIAGNOSIS — I10 Essential (primary) hypertension: Secondary | ICD-10-CM | POA: Insufficient documentation

## 2023-04-26 NOTE — Patient Instructions (Signed)
Medication Instructions:  Your physician recommends that you continue on your current medications as directed. Please refer to the Current Medication list given to you today.  *If you need a refill on your cardiac medications before your next appointment, please call your pharmacy*  Lab Work: Your physician recommends that you return for lab work in: 3 months for fasting lipid and liver panel.   If you have labs (blood work) drawn today and your tests are completely normal, you will receive your results only by: MyChart Message (if you have MyChart) OR A paper copy in the mail If you have any lab test that is abnormal or we need to change your treatment, we will call you to review the results.  Testing/Procedures: None ordered today.  Follow-Up: At [facility], you and your health needs are our priority.  As part of our continuing mission to provide you with exceptional heart care, we have created designated Provider Care Teams.  These Care Teams include your primary Cardiologist (physician) and Advanced Practice Providers (APPs -  Physician Assistants and Nurse Practitioners) who all work together to provide you with the care you need, when you need it.  We recommend signing up for the patient portal called "MyChart".  Sign up information is provided on this After Visit Summary.  MyChart is used to connect with patients for Virtual Visits (Telemedicine).  Patients are able to view lab/test results, encounter notes, upcoming appointments, etc.  Non-urgent messages can be sent to your provider as well.   To learn more about what you can do with MyChart, go to [email or web address].    Your next appointment:   1 year   Provider:   Charlton Haws, MD

## 2023-04-28 DIAGNOSIS — R918 Other nonspecific abnormal finding of lung field: Secondary | ICD-10-CM | POA: Diagnosis not present

## 2023-04-28 DIAGNOSIS — N281 Cyst of kidney, acquired: Secondary | ICD-10-CM | POA: Diagnosis not present

## 2023-04-28 DIAGNOSIS — C61 Malignant neoplasm of prostate: Secondary | ICD-10-CM | POA: Diagnosis not present

## 2023-04-28 DIAGNOSIS — I700 Atherosclerosis of aorta: Secondary | ICD-10-CM | POA: Diagnosis not present

## 2023-05-23 DIAGNOSIS — M47816 Spondylosis without myelopathy or radiculopathy, lumbar region: Secondary | ICD-10-CM | POA: Diagnosis not present

## 2023-06-01 DIAGNOSIS — R350 Frequency of micturition: Secondary | ICD-10-CM | POA: Diagnosis not present

## 2023-06-01 DIAGNOSIS — R300 Dysuria: Secondary | ICD-10-CM | POA: Diagnosis not present

## 2023-06-01 DIAGNOSIS — C61 Malignant neoplasm of prostate: Secondary | ICD-10-CM | POA: Diagnosis not present

## 2023-06-16 ENCOUNTER — Emergency Department (HOSPITAL_COMMUNITY)
Admission: EM | Admit: 2023-06-16 | Discharge: 2023-06-16 | Disposition: A | Discharge status: Home / Self Care | Payer: Medicare Other | Attending: Emergency Medicine | Admitting: Emergency Medicine

## 2023-06-16 ENCOUNTER — Encounter (HOSPITAL_COMMUNITY): Payer: Self-pay

## 2023-06-16 DIAGNOSIS — R339 Retention of urine, unspecified: Secondary | ICD-10-CM

## 2023-06-16 DIAGNOSIS — N3000 Acute cystitis without hematuria: Secondary | ICD-10-CM | POA: Diagnosis not present

## 2023-06-16 LAB — CBC WITH DIFFERENTIAL/PLATELET
Abs Immature Granulocytes: 0.24 10*3/uL — ABNORMAL HIGH (ref 0.00–0.07)
Basophils Absolute: 0.1 10*3/uL (ref 0.0–0.1)
Basophils Relative: 1 %
Eosinophils Absolute: 0.2 10*3/uL (ref 0.0–0.5)
Eosinophils Relative: 3 %
HCT: 40.5 % (ref 39.0–52.0)
Hemoglobin: 13.2 g/dL (ref 13.0–17.0)
Immature Granulocytes: 3 %
Lymphocytes Relative: 17 %
Lymphs Abs: 1.2 10*3/uL (ref 0.7–4.0)
MCH: 29.2 pg (ref 26.0–34.0)
MCHC: 32.6 g/dL (ref 30.0–36.0)
MCV: 89.6 fL (ref 80.0–100.0)
Monocytes Absolute: 0.6 10*3/uL (ref 0.1–1.0)
Monocytes Relative: 9 %
Neutro Abs: 4.7 10*3/uL (ref 1.7–7.7)
Neutrophils Relative %: 67 %
Platelets: 301 10*3/uL (ref 150–400)
RBC: 4.52 MIL/uL (ref 4.22–5.81)
RDW: 14.5 % (ref 11.5–15.5)
WBC: 7.1 10*3/uL (ref 4.0–10.5)
nRBC: 0 % (ref 0.0–0.2)

## 2023-06-16 LAB — URINALYSIS, ROUTINE W REFLEX MICROSCOPIC
Bacteria, UA: NONE SEEN
Bilirubin Urine: NEGATIVE
Glucose, UA: NEGATIVE mg/dL
Ketones, ur: NEGATIVE mg/dL
Nitrite: POSITIVE — AB
Protein, ur: 100 mg/dL — AB
Specific Gravity, Urine: 1.013 (ref 1.005–1.030)
WBC, UA: >50 WBC/hpf (ref 0–5)
pH: 6 (ref 5.0–8.0)

## 2023-06-16 LAB — BASIC METABOLIC PANEL
Anion gap: 13 (ref 5–15)
BUN: 21 mg/dL (ref 8–23)
CO2: 19 mmol/L — ABNORMAL LOW (ref 22–32)
Calcium: 9.6 mg/dL (ref 8.9–10.3)
Chloride: 99 mmol/L (ref 98–111)
Creatinine, Ser: 1.32 mg/dL — ABNORMAL HIGH (ref 0.61–1.24)
GFR, Estimated: 56 mL/min — ABNORMAL LOW (ref >60)
Glucose, Bld: 100 mg/dL — ABNORMAL HIGH (ref 70–99)
Potassium: 4 mmol/L (ref 3.5–5.1)
Sodium: 131 mmol/L — ABNORMAL LOW (ref 135–145)

## 2023-06-16 MED ORDER — CEPHALEXIN 500 MG PO CAPS: 500.0000 mg | ORAL_CAPSULE | Freq: Four times a day (QID) | ORAL | 0 refills | Status: DC

## 2023-06-16 MED ORDER — SODIUM CHLORIDE 0.9 % IV SOLN
2.0000 g | Freq: Once | INTRAVENOUS | Status: AC
  Administered 2023-06-16: 2 g via INTRAVENOUS
  Filled 2023-06-16: qty 20

## 2023-06-16 MED ORDER — HYDROMORPHONE HCL 1 MG/ML IJ SOLN
0.5000 mg | Freq: Once | INTRAMUSCULAR | Status: AC
  Administered 2023-06-16: 0.5 mg via INTRAVENOUS
  Filled 2023-06-16: qty 1

## 2023-06-16 MED ORDER — LORAZEPAM 2 MG/ML IJ SOLN
0.5000 mg | Freq: Once | INTRAMUSCULAR | Status: AC
  Administered 2023-06-16: 0.5 mg via INTRAVENOUS
  Filled 2023-06-16: qty 1

## 2023-06-16 NOTE — ED Triage Notes (Signed)
Pt presents with c/o urinary retention. Pt reports it has been weeks since he feels like he has completely emptied his bladder. Pt reports he did go to the bathroom, just a trickle, at 4 am this morning. Pt reports when he walked into the ER today, he had a bladder spasm and urinated on himself.

## 2023-06-16 NOTE — Discharge Instructions (Signed)
Follow-up with [facility] next week.  Call them today to make an appointment for next week.  Tell them the patient went to the emergency department and we have placed a Foley and you

## 2023-06-16 NOTE — ED Provider Notes (Signed)
[facility] Emergency Department Provider Note ___________________________________   Event Date/Time   First MD Initiated Contact with Patient 06/16/23 1012     (approximate)  I have reviewed the triage vital signs and the nursing notes.   HISTORY  Chief Complaint Urinary Retention  Past Medical History:  Diagnosis Date   Arthritis 03/28/2013   Back pain    Cancer (HCC) 2014   prostate cancer   Cataracts, bilateral    Dysrhythmia    occasional PAC- asymptomatic, per MD pt cut back on caffeine   ED (erectile dysfunction) 12/18/2021   due to arterial insufficency   Fungal nail infection    GERD (gastroesophageal reflux disease)    Hearing loss    wear hearing aids   HSV infection 2016   Hyperlipidemia    Hypertension    no meds, dx yrs ago per patient but normal last several yrs   Hypertensive retinopathy    OU   Hypothyroidism    Personal history of other diseases of the digestive system    Personal history of other diseases of the nervous system and sense organs 2014   Personal history of other endocrine, nutritional and metabolic disease 1610   Pneumonia    x 1   Primary testicular hypogonadism 2017   Retarded ejaculation 2021   Sleep apnea 2014   history of, did not qualify for cpap    Patient Active Problem List   Diagnosis Date Noted   History of herpes genitalis 09/16/2020   Cataracts, bilateral 09/05/2018   Incomplete right bundle branch block (RBBB) 09/05/2018   Prostate cancer (HCC) 02/14/2014   Hypogonadism male 06/10/2011   PROSTATE SPECIFIC ANTIGEN, ELEVATED 07/13/2010   Hypothyroidism 05/27/2008   CARCINOMA, BASAL CELL, NOSE 10/05/2007   ERECTILE DYSFUNCTION, MILD 10/05/2007   LOSS, MIXED HEARING, BILATERAL 02/01/2007   BACK PAIN 02/01/2007   Hyperlipidemia 01/20/2007   Essential hypertension 01/20/2007    Past Surgical History:  Procedure Laterality Date   ankle surgery trauma Right    CATARACT EXTRACTION Left  11/29/2018   Dr. Nile Riggs   COLONOSCOPY     COSMETIC SURGERY     loose skin removed around neck area   CYSTOSCOPY N/A 01/31/2023   Procedure: CYSTOSCOPY;  Surgeon: Jannifer Hick, MD;  Location: [facility];  Service: Urology;  Laterality: N/A;   eye lid surgery     EYE SURGERY     fungal nail     LASIK Bilateral    PARS PLANA VITRECTOMY Left 03/01/2019   Procedure: PARS PLANA VITRECTOMY WITH 25 GAUGE;  Surgeon: Rennis Chris, MD;  Location: [facility] OR;  Service: Ophthalmology;  Laterality: Left;   PROSTATE BIOPSY  06/21/2022   2020, 2018   RADIOACTIVE SEED IMPLANT N/A 01/31/2023   Procedure: RADIOACTIVE SEED IMPLANT/BRACHYTHERAPY IMPLANT;  Surgeon: Jannifer Hick, MD;  Location: [facility];  Service: Urology;  Laterality: N/A;  90 MINUTES NEEDED FOR CASE   REMOVAL RETAINED LENS Left 03/01/2019   Procedure: REMOVAL OF RETAINED LENS MATERIALS LEFT EYE;  Surgeon: Rennis Chris, MD;  Location: [facility] OR;  Service: Ophthalmology;  Laterality: Left;   SPACE OAR INSTILLATION N/A 01/31/2023   Procedure: SPACE OAR INSTILLATION;  Surgeon: Jannifer Hick, MD;  Location: [facility];  Service: Urology;  Laterality: N/A;   VASECTOMY  1981   WISDOM TOOTH EXTRACTION      Prior to Admission medications   Medication Sig Start Date End Date Taking? Authorizing Provider  cephALEXin (KEFLEX) 500 MG capsule Take 1 capsule (500 mg total) by mouth 4 (four) times daily. 06/16/23  Yes Bethann Berkshire, MD  Ascorbic Acid (VITAMIN C) 1000 MG tablet Take 1,000 mg by mouth daily.    [provider]  atorvastatin (LIPITOR) 20 MG tablet Take 1 tablet (20 mg total) by mouth daily. 11/02/22   Wendall Stade, MD  Cholecalciferol (VITAMIN D) 1000 UNITS capsule Take 1,000 Units by mouth daily.    [provider]  ciprofloxacin (CIPRO) 500 MG tablet Take 1 tablet (500 mg total) by mouth 2 (two) times daily. Patient not taking: Reported on 04/26/2023 03/18/23   Bruning,  Ashlyn, PA-C  Coenzyme Q10 100 MG capsule Take 100 mg by mouth daily.    [provider]  Cyanocobalamin (VITAMIN B-12 CR) 1500 MCG TBCR Take 1,500 mcg by mouth daily.    [provider]  docusate sodium (COLACE) 100 MG capsule Take 1 capsule (100 mg total) by mouth daily as needed for up to 30 doses. Patient not taking: Reported on 04/26/2023 01/31/23   Jannifer Hick, MD  EPINEPHrine 0.3 mg/0.3 mL IJ SOAJ injection INJECT 0.3 ML INTRAMUSCULARLY ONCE AS NEEDED AS DIRECTED 03/04/21   [provider]  fluticasone (FLONASE) 50 MCG/ACT nasal spray Place 1 spray into both nostrils daily as needed for allergies or rhinitis.    [provider]  levothyroxine (SYNTHROID) 50 MCG tablet TAKE ONE TABLET BY MOUTH DAILY FOR HYPOTHYROIDISM 09/04/21   [provider]  liothyronine (CYTOMEL) 5 MCG tablet Take 5 mcg by mouth daily.    [provider]  loratadine (CLARITIN) 10 MG tablet Take 10 mg by mouth daily as needed for allergies.     [provider]  MAGNESIUM CITRATE PO Take 400 mg by mouth daily.     [provider]  metoprolol tartrate (LOPRESSOR) 25 MG tablet Take 1 tablet (25 mg total) by mouth 2 (two) times daily. 02/02/23   Wendall Stade, MD  oxybutynin (DITROPAN XL) 5 MG 24 hr tablet Take 1 tablet (5 mg total) by mouth at bedtime. 03/16/23   Bruning, Ashlyn, PA-C  pantoprazole (PROTONIX) 40 MG tablet TAKE 1 TABLET BY MOUTH EVERY DAY 01/04/23   Hilarie Fredrickson, MD  phenazopyridine (PYRIDIUM) 200 MG tablet Take 1 tablet (200 mg total) by mouth 3 (three) times daily as needed for pain. 03/16/23   Bruning, Ashlyn, PA-C  RESTASIS 0.05 % ophthalmic emulsion Place 1 drop into the left eye 2 (two) times daily. 10/30/20   [provider]  tadalafil (CIALIS) 5 MG tablet Take 5 mg by mouth daily as needed for erectile dysfunction.  08/01/18   [provider]  tamsulosin (FLOMAX) 0.4 MG CAPS capsule Take 1 capsule (0.4 mg total) by  mouth daily. 03/16/23   Bruning, Ashlyn, PA-C  valACYclovir (VALTREX) 500 MG tablet TAKE ONE TABLET BY MOUTH TWICE DAILY FOR 3 DAYS AS NEEDED FOR HERPES FLARE 08/24/21   Burchette, Elberta Fortis, MD  vitamin E 400 UNIT capsule Take 400 Units by mouth daily.    [provider]  Zinc 50 MG CAPS Take 50 mg by mouth daily.    [provider]    Allergies Bee venom, Dust mite extract, and Grass extracts [gramineae pollens]  Family History  Problem Relation Age of Onset   Heart disease Mother    Liver disease Father    Alcohol abuse Father    Cancer Father  hepatic cancer ?primary   Arthritis Sister    COPD Sister     Social History Social History   Tobacco Use   Smoking status: Former    Current packs/day: 0.00    Types: Cigarettes    Quit date: 07/05/1981    Years since quitting: 41.9   Smokeless tobacco: Never  Vaping Use   Vaping status: Never Used  Substance Use Topics   Alcohol use: Yes    Alcohol/week: 3.0 - 4.0 standard drinks of alcohol    Types: 3 - 4 Glasses of wine per week   Drug use: No    Review of Systems  Eyes: No visual changes. ENT: No sore throat. Cardiovascular: Denies chest pain. Respiratory: Denies shortness of breath. Gastrointestinal: No abdominal pain.  No nausea, no vomiting.  No diarrhea.  No constipation. Genitourinary: Negative for dysuria. Musculoskeletal: Negative for back pain. Skin: Negative for rash. Neurological: Negative for headaches, focal weakness or numbness.  ____________________________________________   PHYSICAL EXAM:  VITAL SIGNS: ED Triage Vitals  Encounter Vitals Group     BP 06/16/23 1004 (!) 160/105     Systolic BP Percentile --      Diastolic BP Percentile --      Pulse Rate 06/16/23 1004 (!) 118     Resp 06/16/23 1004 18     Temp 06/16/23 1004 97.7 F (36.5 C)     Temp Source 06/16/23 1004 Oral     SpO2 06/16/23 1004 99 %     Weight --      Height --      Head Circumference --      Peak  Flow --      Pain Score 06/16/23 1003 10     Pain Loc --      Pain Education --      Exclude from Growth Chart --    lert and oriented. Well appearing and in no acute distress. Eyes: Conjunctivae are normal. PERRL. EOMI. Head: Atraumatic. Nose: No congestion/rhinnorhea. Mouth/Throat: Mucous membranes are moist.  Oropharynx non-erythematous. Neck: No stridor.   Cardiovascular: Normal rate, regular rhythm. Grossly normal heart sounds.  Good peripheral circulation. Respiratory: Normal respiratory effort.  No retractions. Lungs CTAB. Gastrointestinal: Soft and nontender. No distention. No abdominal bruits. No CVA tenderness. Musculoskeletal: No lower extremity tenderness nor edema.  No joint effusions. Neurologic:  Normal speech and language. No gross focal neurologic deficits are appreciated. No gait instability. Skin:  Skin is warm, dry and intact. No rash noted. Psychiatric: Mood and affect are normal. Speech and behavior are normal.  ____________________________________________   LABS (all labs ordered are listed, but only abnormal results are displayed)  Labs Reviewed  CBC WITH DIFFERENTIAL/PLATELET - Abnormal; Notable for the following components:      Result Value   Abs Immature Granulocytes 0.24 (*)    All other components within normal limits  BASIC METABOLIC PANEL - Abnormal; Notable for the following components:   Sodium 131 (*)    CO2 19 (*)    Glucose, Bld 100 (*)    Creatinine, Ser 1.32 (*)    GFR, Estimated 56 (*)    All other components within normal limits  URINALYSIS, ROUTINE W REFLEX MICROSCOPIC - Abnormal; Notable for the following components:   Color, Urine AMBER (*)    APPearance TURBID (*)    Hgb urine dipstick SMALL (*)    Protein, ur 100 (*)    Nitrite POSITIVE (*)    Leukocytes,Ua LARGE (*)    All  other components within normal limits  URINE CULTURE      ____________________________________________   FINAL CLINICAL IMPRESSION(S) / ED  DIAGNOSES  Final diagnoses:  Urinary retention  Acute cystitis without hematuria     ED Discharge Orders          Ordered    cephALEXin (KEFLEX) 500 MG capsule  4 times daily        06/16/23 1359             Note:  This document was prepared using Dragon voice recognition software and may include unintentional dictation errors.     Bethann Berkshire, MD 06/20/23 [phone]

## 2023-06-16 NOTE — ED Provider Notes (Signed)
[facility] EMERGENCY DEPARTMENT AT [facility] Provider Note   CSN: [id number] Arrival date & time: 06/16/23  4098     History  Chief Complaint  Patient presents with   Urinary Retention    Ryan [name] is a 77 y.o. male.  Patient has a history of prostate cancer.  He states he is having difficulty urinating  The history is provided by the patient and medical records. No language interpreter was used.  Abdominal Pain Pain location:  Periumbilical Pain quality: aching   Pain radiates to:  Does not radiate Pain severity:  Mild Onset quality:  Sudden Timing:  Constant Progression:  Worsening Chronicity:  New Context: not alcohol use   Relieved by:  Nothing Worsened by:  Nothing Associated symptoms: no chest pain, no cough, no diarrhea, no fatigue and no hematuria        Home Medications Prior to Admission medications   Medication Sig Start Date End Date Taking? Authorizing Provider  cephALEXin (KEFLEX) 500 MG capsule Take 1 capsule (500 mg total) by mouth 4 (four) times daily. 06/16/23  Yes Bethann Berkshire, MD  Ascorbic Acid (VITAMIN C) 1000 MG tablet Take 1,000 mg by mouth daily.    [provider]  atorvastatin (LIPITOR) 20 MG tablet Take 1 tablet (20 mg total) by mouth daily. 11/02/22   Wendall Stade, MD  Cholecalciferol (VITAMIN D) 1000 UNITS capsule Take 1,000 Units by mouth daily.    [provider]  ciprofloxacin (CIPRO) 500 MG tablet Take 1 tablet (500 mg total) by mouth 2 (two) times daily. Patient not taking: Reported on 04/26/2023 03/18/23   Bruning, Ashlyn, PA-C  Coenzyme Q10 100 MG capsule Take 100 mg by mouth daily.    [provider]  Cyanocobalamin (VITAMIN B-12 CR) 1500 MCG TBCR Take 1,500 mcg by mouth daily.    [provider]  docusate sodium (COLACE) 100 MG capsule Take 1 capsule (100 mg total) by mouth daily as needed for up to 30 doses. Patient not taking: Reported on 04/26/2023 01/31/23   Jannifer Hick, MD  EPINEPHrine 0.3 mg/0.3 mL IJ SOAJ injection INJECT 0.3 ML INTRAMUSCULARLY ONCE AS NEEDED AS DIRECTED 03/04/21   [provider]  fluticasone (FLONASE) 50 MCG/ACT nasal spray Place 1 spray into both nostrils daily as needed for allergies or rhinitis.    [provider]  levothyroxine (SYNTHROID) 50 MCG tablet TAKE ONE TABLET BY MOUTH DAILY FOR HYPOTHYROIDISM 09/04/21   [provider]  liothyronine (CYTOMEL) 5 MCG tablet Take 5 mcg by mouth daily.    [provider]  loratadine (CLARITIN) 10 MG tablet Take 10 mg by mouth daily as needed for allergies.     [provider]  MAGNESIUM CITRATE PO Take 400 mg by mouth daily.     [provider]  metoprolol tartrate (LOPRESSOR) 25 MG tablet Take 1 tablet (25 mg total) by mouth 2 (two) times daily. 02/02/23   Wendall Stade, MD  oxybutynin (DITROPAN XL) 5 MG 24 hr tablet Take 1 tablet (5 mg total) by mouth at bedtime. 03/16/23   Bruning, Ashlyn, PA-C  pantoprazole (PROTONIX) 40 MG tablet TAKE 1 TABLET BY MOUTH EVERY DAY 01/04/23   Hilarie Fredrickson, MD  phenazopyridine (PYRIDIUM) 200 MG tablet Take 1 tablet (200 mg total) by mouth 3 (three) times daily as needed for pain. 03/16/23   Bruning, Ashlyn, PA-C  RESTASIS 0.05 % ophthalmic emulsion Place 1 drop into the left eye 2 (two) times  daily. 10/30/20   [provider]  tadalafil (CIALIS) 5 MG tablet Take 5 mg by mouth daily as needed for erectile dysfunction.  08/01/18   [provider]  tamsulosin (FLOMAX) 0.4 MG CAPS capsule Take 1 capsule (0.4 mg total) by mouth daily. 03/16/23   Bruning, Ashlyn, PA-C  valACYclovir (VALTREX) 500 MG tablet TAKE ONE TABLET BY MOUTH TWICE DAILY FOR 3 DAYS AS NEEDED FOR HERPES FLARE 08/24/21   Burchette, Elberta Fortis, MD  vitamin E 400 UNIT capsule Take 400 Units by mouth daily.    [provider]  Zinc 50 MG CAPS Take 50 mg by mouth daily.    [provider]      Allergies    Bee venom,  Dust mite extract, and Grass extracts [gramineae pollens]    Review of Systems   Review of Systems  Constitutional:  Negative for appetite change and fatigue.  HENT:  Negative for congestion, ear discharge and sinus pressure.   Eyes:  Negative for discharge.  Respiratory:  Negative for cough.   Cardiovascular:  Negative for chest pain.  Gastrointestinal:  Positive for abdominal pain. Negative for diarrhea.  Genitourinary:  Negative for frequency and hematuria.  Musculoskeletal:  Negative for back pain.  Skin:  Negative for rash.  Neurological:  Negative for seizures and headaches.  Psychiatric/Behavioral:  Negative for hallucinations.     Physical Exam Updated Vital Signs BP 134/75   Pulse 83   Temp 97.7 F (36.5 C) (Oral)   Resp 18   SpO2 96%  Physical Exam Vitals and nursing note reviewed.  Constitutional:      Appearance: He is well-developed.  HENT:     Head: Normocephalic.     Nose: Nose normal.  Eyes:     General: No scleral icterus.    Conjunctiva/sclera: Conjunctivae normal.  Neck:     Thyroid: No thyromegaly.  Cardiovascular:     Rate and Rhythm: Normal rate and regular rhythm.     Heart sounds: No murmur heard.    No friction rub. No gallop.  Pulmonary:     Breath sounds: No stridor. No wheezing or rales.  Chest:     Chest wall: No tenderness.  Abdominal:     General: There is no distension.     Tenderness: There is abdominal tenderness. There is no rebound.  Musculoskeletal:        General: Normal range of motion.     Cervical back: Neck supple.  Lymphadenopathy:     Cervical: No cervical adenopathy.  Skin:    Findings: No erythema or rash.  Neurological:     Mental Status: He is alert and oriented to person, place, and time.     Motor: No abnormal muscle tone.     Coordination: Coordination normal.  Psychiatric:        Behavior: Behavior normal.     ED Results / Procedures / Treatments   Labs (all labs ordered are listed, but only abnormal  results are displayed) Labs Reviewed  CBC WITH DIFFERENTIAL/PLATELET - Abnormal; Notable for the following components:      Result Value   Abs Immature Granulocytes 0.24 (*)    All other components within normal limits  BASIC METABOLIC PANEL - Abnormal; Notable for the following components:   Sodium 131 (*)    CO2 19 (*)    Glucose, Bld 100 (*)    Creatinine, Ser 1.32 (*)    GFR, Estimated 56 (*)    All other components  within normal limits  URINALYSIS, ROUTINE W REFLEX MICROSCOPIC - Abnormal; Notable for the following components:   Color, Urine AMBER (*)    APPearance TURBID (*)    Hgb urine dipstick SMALL (*)    Protein, ur 100 (*)    Nitrite POSITIVE (*)    Leukocytes,Ua LARGE (*)    All other components within normal limits  URINE CULTURE    EKG None  Radiology No results found.  Procedures Procedures    Medications Ordered in ED Medications  HYDROmorphone (DILAUDID) injection 0.5 mg (0.5 mg Intravenous Given 06/16/23 1053)  LORazepam (ATIVAN) injection 0.5 mg (0.5 mg Intravenous Given 06/16/23 1054)  cefTRIAXone (ROCEPHIN) 2 g in sodium chloride 0.9 % 100 mL IVPB (2 g Intravenous New Bag/Given 06/16/23 1157)    ED Course/ Medical Decision Making/ A&P                                 Medical Decision Making Amount and/or Complexity of Data Reviewed Labs: ordered.  Risk Prescription drug management.   Patient with urinary retention.  Patient had relief with insertion of Foley.  He also has a UTI.  He is placed on Keflex and will continue to use a Foley and follow-up with urology        Final Clinical Impression(s) / ED Diagnoses Final diagnoses:  Urinary retention  Acute cystitis without hematuria    Rx / DC Orders ED Discharge Orders          Ordered    cephALEXin (KEFLEX) 500 MG capsule  4 times daily        06/16/23 1359              Bethann Berkshire, MD 06/20/23 1706

## 2023-06-17 LAB — URINE CULTURE: Culture: <10000 — AB

## 2023-06-20 DIAGNOSIS — R300 Dysuria: Secondary | ICD-10-CM | POA: Diagnosis not present

## 2023-06-20 DIAGNOSIS — R339 Retention of urine, unspecified: Secondary | ICD-10-CM | POA: Diagnosis not present

## 2023-06-20 DIAGNOSIS — C61 Malignant neoplasm of prostate: Secondary | ICD-10-CM | POA: Diagnosis not present

## 2023-07-03 ENCOUNTER — Other Ambulatory Visit: Payer: Self-pay | Admitting: Internal Medicine

## 2023-07-12 DIAGNOSIS — R339 Retention of urine, unspecified: Secondary | ICD-10-CM | POA: Diagnosis not present

## 2023-07-12 DIAGNOSIS — R300 Dysuria: Secondary | ICD-10-CM | POA: Diagnosis not present

## 2023-07-12 DIAGNOSIS — C61 Malignant neoplasm of prostate: Secondary | ICD-10-CM | POA: Diagnosis not present

## 2023-07-22 ENCOUNTER — Other Ambulatory Visit: Payer: Self-pay

## 2023-07-22 DIAGNOSIS — E785 Hyperlipidemia, unspecified: Secondary | ICD-10-CM

## 2023-07-22 DIAGNOSIS — I10 Essential (primary) hypertension: Secondary | ICD-10-CM

## 2023-07-22 DIAGNOSIS — I471 Supraventricular tachycardia, unspecified: Secondary | ICD-10-CM

## 2023-07-23 LAB — LIPID PANEL
Chol/HDL Ratio: 2.7 {ratio} (ref 0.0–5.0)
Cholesterol, Total: 173 mg/dL (ref 100–199)
HDL: 64 mg/dL (ref >39)
LDL Chol Calc (NIH): 94 mg/dL (ref 0–99)
Triglycerides: 78 mg/dL (ref 0–149)
VLDL Cholesterol Cal: 15 mg/dL (ref 5–40)

## 2023-07-23 LAB — HEPATIC FUNCTION PANEL
ALT: 43 [IU]/L (ref 0–44)
AST: 39 [IU]/L (ref 0–40)
Albumin: 4.2 g/dL (ref 3.8–4.8)
Alkaline Phosphatase: 151 [IU]/L — ABNORMAL HIGH (ref 44–121)
Bilirubin Total: 0.5 mg/dL (ref 0.0–1.2)
Bilirubin, Direct: 0.21 mg/dL (ref 0.00–0.40)
Total Protein: 6.4 g/dL (ref 6.0–8.5)

## 2023-07-25 ENCOUNTER — Telehealth: Payer: Self-pay

## 2023-07-25 DIAGNOSIS — I10 Essential (primary) hypertension: Secondary | ICD-10-CM

## 2023-07-25 DIAGNOSIS — I471 Supraventricular tachycardia, unspecified: Secondary | ICD-10-CM

## 2023-07-25 DIAGNOSIS — E785 Hyperlipidemia, unspecified: Secondary | ICD-10-CM

## 2023-07-25 MED ORDER — ATORVASTATIN CALCIUM 40 MG PO TABS: 40.0000 mg | ORAL_TABLET | Freq: Every day | ORAL | 3 refills | Status: DC

## 2023-07-25 NOTE — Telephone Encounter (Signed)
-----   Message from Charlton Haws sent at 07/23/2023  8:50 PM EST ----- LDL above goal Has he been taking lipitor 20 mg if so increase to 40 mg and repeat labs in 3 months

## 2023-08-01 DIAGNOSIS — N3020 Other chronic cystitis without hematuria: Secondary | ICD-10-CM | POA: Diagnosis not present

## 2023-08-01 DIAGNOSIS — Z79899 Other long term (current) drug therapy: Secondary | ICD-10-CM | POA: Diagnosis not present

## 2023-08-01 DIAGNOSIS — N3289 Other specified disorders of bladder: Secondary | ICD-10-CM | POA: Diagnosis not present

## 2023-08-01 DIAGNOSIS — R339 Retention of urine, unspecified: Secondary | ICD-10-CM | POA: Diagnosis not present

## 2023-08-01 DIAGNOSIS — I10 Essential (primary) hypertension: Secondary | ICD-10-CM | POA: Diagnosis not present

## 2023-08-01 DIAGNOSIS — E039 Hypothyroidism, unspecified: Secondary | ICD-10-CM | POA: Diagnosis not present

## 2023-08-03 ENCOUNTER — Inpatient Hospital Stay (HOSPITAL_COMMUNITY)
Admission: EM | Admit: 2023-08-03 | Discharge: 2023-08-08 | DRG: 698 | Disposition: A | Discharge status: Home Health | Payer: Medicare Other | Attending: Internal Medicine | Admitting: Internal Medicine

## 2023-08-03 ENCOUNTER — Other Ambulatory Visit: Payer: Self-pay

## 2023-08-03 ENCOUNTER — Emergency Department (HOSPITAL_COMMUNITY): Payer: Medicare Other

## 2023-08-03 DIAGNOSIS — B965 Pseudomonas (aeruginosa) (mallei) (pseudomallei) as the cause of diseases classified elsewhere: Secondary | ICD-10-CM | POA: Diagnosis present

## 2023-08-03 DIAGNOSIS — Z87891 Personal history of nicotine dependence: Secondary | ICD-10-CM

## 2023-08-03 DIAGNOSIS — M5416 Radiculopathy, lumbar region: Secondary | ICD-10-CM | POA: Diagnosis present

## 2023-08-03 DIAGNOSIS — Z8261 Family history of arthritis: Secondary | ICD-10-CM

## 2023-08-03 DIAGNOSIS — R531 Weakness: Secondary | ICD-10-CM

## 2023-08-03 DIAGNOSIS — Z811 Family history of alcohol abuse and dependence: Secondary | ICD-10-CM

## 2023-08-03 DIAGNOSIS — Z66 Do not resuscitate: Secondary | ICD-10-CM | POA: Diagnosis present

## 2023-08-03 DIAGNOSIS — G8929 Other chronic pain: Secondary | ICD-10-CM | POA: Diagnosis not present

## 2023-08-03 DIAGNOSIS — Z8546 Personal history of malignant neoplasm of prostate: Secondary | ICD-10-CM | POA: Diagnosis not present

## 2023-08-03 DIAGNOSIS — N3001 Acute cystitis with hematuria: Principal | ICD-10-CM

## 2023-08-03 DIAGNOSIS — E785 Hyperlipidemia, unspecified: Secondary | ICD-10-CM | POA: Diagnosis present

## 2023-08-03 DIAGNOSIS — Z79890 Hormone replacement therapy (postmenopausal): Secondary | ICD-10-CM

## 2023-08-03 DIAGNOSIS — Y828 Other medical devices associated with adverse incidents: Secondary | ICD-10-CM | POA: Diagnosis present

## 2023-08-03 DIAGNOSIS — N390 Urinary tract infection, site not specified: Principal | ICD-10-CM | POA: Diagnosis present

## 2023-08-03 DIAGNOSIS — Z9109 Other allergy status, other than to drugs and biological substances: Secondary | ICD-10-CM

## 2023-08-03 DIAGNOSIS — T83518A Infection and inflammatory reaction due to other urinary catheter, initial encounter: Principal | ICD-10-CM | POA: Diagnosis present

## 2023-08-03 DIAGNOSIS — E860 Dehydration: Secondary | ICD-10-CM | POA: Diagnosis present

## 2023-08-03 DIAGNOSIS — R509 Fever, unspecified: Secondary | ICD-10-CM | POA: Diagnosis not present

## 2023-08-03 DIAGNOSIS — Z1152 Encounter for screening for COVID-19: Secondary | ICD-10-CM

## 2023-08-03 DIAGNOSIS — N3289 Other specified disorders of bladder: Secondary | ICD-10-CM | POA: Diagnosis present

## 2023-08-03 DIAGNOSIS — K219 Gastro-esophageal reflux disease without esophagitis: Secondary | ICD-10-CM | POA: Diagnosis present

## 2023-08-03 DIAGNOSIS — Z8249 Family history of ischemic heart disease and other diseases of the circulatory system: Secondary | ICD-10-CM

## 2023-08-03 DIAGNOSIS — Z800 Family history of malignant neoplasm of digestive organs: Secondary | ICD-10-CM | POA: Diagnosis not present

## 2023-08-03 DIAGNOSIS — H9190 Unspecified hearing loss, unspecified ear: Secondary | ICD-10-CM | POA: Diagnosis present

## 2023-08-03 DIAGNOSIS — C61 Malignant neoplasm of prostate: Secondary | ICD-10-CM | POA: Diagnosis not present

## 2023-08-03 DIAGNOSIS — A419 Sepsis, unspecified organism: Secondary | ICD-10-CM | POA: Diagnosis present

## 2023-08-03 DIAGNOSIS — Z6820 Body mass index (BMI) 20.0-20.9, adult: Secondary | ICD-10-CM

## 2023-08-03 DIAGNOSIS — N179 Acute kidney failure, unspecified: Secondary | ICD-10-CM | POA: Diagnosis not present

## 2023-08-03 DIAGNOSIS — Y842 Radiological procedure and radiotherapy as the cause of abnormal reaction of the patient, or of later complication, without mention of misadventure at the time of the procedure: Secondary | ICD-10-CM | POA: Diagnosis present

## 2023-08-03 DIAGNOSIS — Z960 Presence of urogenital implants: Secondary | ICD-10-CM | POA: Diagnosis present

## 2023-08-03 DIAGNOSIS — I10 Essential (primary) hypertension: Secondary | ICD-10-CM | POA: Diagnosis present

## 2023-08-03 DIAGNOSIS — Z91030 Bee allergy status: Secondary | ICD-10-CM

## 2023-08-03 DIAGNOSIS — E039 Hypothyroidism, unspecified: Secondary | ICD-10-CM | POA: Diagnosis present

## 2023-08-03 DIAGNOSIS — Z9889 Other specified postprocedural states: Secondary | ICD-10-CM

## 2023-08-03 DIAGNOSIS — Z79899 Other long term (current) drug therapy: Secondary | ICD-10-CM

## 2023-08-03 DIAGNOSIS — R54 Age-related physical debility: Secondary | ICD-10-CM | POA: Diagnosis present

## 2023-08-03 DIAGNOSIS — I471 Supraventricular tachycardia, unspecified: Secondary | ICD-10-CM | POA: Diagnosis present

## 2023-08-03 DIAGNOSIS — Z8639 Personal history of other endocrine, nutritional and metabolic disease: Secondary | ICD-10-CM

## 2023-08-03 DIAGNOSIS — E876 Hypokalemia: Secondary | ICD-10-CM | POA: Diagnosis not present

## 2023-08-03 DIAGNOSIS — M549 Dorsalgia, unspecified: Secondary | ICD-10-CM | POA: Diagnosis not present

## 2023-08-03 DIAGNOSIS — I959 Hypotension, unspecified: Secondary | ICD-10-CM | POA: Diagnosis present

## 2023-08-03 DIAGNOSIS — R000 Tachycardia, unspecified: Secondary | ICD-10-CM | POA: Diagnosis not present

## 2023-08-03 DIAGNOSIS — Z825 Family history of asthma and other chronic lower respiratory diseases: Secondary | ICD-10-CM

## 2023-08-03 DIAGNOSIS — T50995A Adverse effect of other drugs, medicaments and biological substances, initial encounter: Secondary | ICD-10-CM | POA: Diagnosis present

## 2023-08-03 DIAGNOSIS — R5082 Postprocedural fever: Secondary | ICD-10-CM | POA: Diagnosis present

## 2023-08-03 LAB — CBC WITH DIFFERENTIAL/PLATELET
Abs Immature Granulocytes: 0.12 10*3/uL — ABNORMAL HIGH (ref 0.00–0.07)
Basophils Absolute: 0.1 10*3/uL (ref 0.0–0.1)
Basophils Relative: 0 %
Eosinophils Absolute: 0.1 10*3/uL (ref 0.0–0.5)
Eosinophils Relative: 0 %
HCT: 42.9 % (ref 39.0–52.0)
Hemoglobin: 14 g/dL (ref 13.0–17.0)
Immature Granulocytes: 1 %
Lymphocytes Relative: 7 %
Lymphs Abs: 1 10*3/uL (ref 0.7–4.0)
MCH: 29.4 pg (ref 26.0–34.0)
MCHC: 32.6 g/dL (ref 30.0–36.0)
MCV: 90.1 fL (ref 80.0–100.0)
Monocytes Absolute: 1 10*3/uL (ref 0.1–1.0)
Monocytes Relative: 7 %
Neutro Abs: 11.9 10*3/uL — ABNORMAL HIGH (ref 1.7–7.7)
Neutrophils Relative %: 85 %
Platelets: 201 10*3/uL (ref 150–400)
RBC: 4.76 MIL/uL (ref 4.22–5.81)
RDW: 14.7 % (ref 11.5–15.5)
WBC: 14.1 10*3/uL — ABNORMAL HIGH (ref 4.0–10.5)
nRBC: 0 % (ref 0.0–0.2)

## 2023-08-03 LAB — URINALYSIS, W/ REFLEX TO CULTURE (INFECTION SUSPECTED)
Bilirubin Urine: NEGATIVE
Glucose, UA: NEGATIVE mg/dL
Ketones, ur: NEGATIVE mg/dL
Nitrite: POSITIVE — AB
Protein, ur: 100 mg/dL — AB
RBC / HPF: >50 RBC/hpf (ref 0–5)
Specific Gravity, Urine: 1.014 (ref 1.005–1.030)
WBC, UA: >50 WBC/hpf (ref 0–5)
pH: 5 (ref 5.0–8.0)

## 2023-08-03 LAB — RESP PANEL BY RT-PCR (RSV, FLU A&B, COVID)  RVPGX2
Influenza A by PCR: NEGATIVE
Influenza B by PCR: NEGATIVE
Resp Syncytial Virus by PCR: NEGATIVE
SARS Coronavirus 2 by RT PCR: NEGATIVE

## 2023-08-03 LAB — I-STAT CHEM 8, ED
BUN: 22 mg/dL (ref 8–23)
BUN: 23 mg/dL (ref 8–23)
Calcium, Ion: 1.23 mmol/L (ref 1.15–1.40)
Calcium, Ion: 1.16 mmol/L (ref 1.15–1.40)
Chloride: 101 mmol/L (ref 98–111)
Chloride: 103 mmol/L (ref 98–111)
Creatinine, Ser: 1.9 mg/dL — ABNORMAL HIGH (ref 0.61–1.24)
Creatinine, Ser: 1.7 mg/dL — ABNORMAL HIGH (ref 0.61–1.24)
Glucose, Bld: 95 mg/dL (ref 70–99)
Glucose, Bld: 99 mg/dL (ref 70–99)
HCT: 43 % (ref 39.0–52.0)
HCT: 40 % (ref 39.0–52.0)
Hemoglobin: 14.6 g/dL (ref 13.0–17.0)
Hemoglobin: 13.6 g/dL (ref 13.0–17.0)
Potassium: 3.8 mmol/L (ref 3.5–5.1)
Potassium: 3.8 mmol/L (ref 3.5–5.1)
Sodium: 138 mmol/L (ref 135–145)
Sodium: 137 mmol/L (ref 135–145)
TCO2: 24 mmol/L (ref 22–32)
TCO2: 25 mmol/L (ref 22–32)

## 2023-08-03 LAB — COMPREHENSIVE METABOLIC PANEL
ALT: 65 U/L — ABNORMAL HIGH (ref 0–44)
AST: 90 U/L — ABNORMAL HIGH (ref 15–41)
Albumin: 4.2 g/dL (ref 3.5–5.0)
Alkaline Phosphatase: 113 U/L (ref 38–126)
Anion gap: 10 (ref 5–15)
BUN: 23 mg/dL (ref 8–23)
CO2: 26 mmol/L (ref 22–32)
Calcium: 9.6 mg/dL (ref 8.9–10.3)
Chloride: 100 mmol/L (ref 98–111)
Creatinine, Ser: 1.67 mg/dL — ABNORMAL HIGH (ref 0.61–1.24)
GFR, Estimated: 42 mL/min — ABNORMAL LOW (ref >60)
Glucose, Bld: 97 mg/dL (ref 70–99)
Potassium: 4 mmol/L (ref 3.5–5.1)
Sodium: 136 mmol/L (ref 135–145)
Total Bilirubin: 1.5 mg/dL — ABNORMAL HIGH (ref 0.0–1.2)
Total Protein: 7.2 g/dL (ref 6.5–8.1)

## 2023-08-03 LAB — PROTIME-INR
INR: 1 (ref 0.8–1.2)
Prothrombin Time: 13.7 s (ref 11.4–15.2)

## 2023-08-03 LAB — I-STAT CG4 LACTIC ACID, ED
Lactic Acid, Venous: 1.8 mmol/L (ref 0.5–1.9)
Lactic Acid, Venous: 1.2 mmol/L (ref 0.5–1.9)

## 2023-08-03 LAB — APTT: aPTT: 28 s (ref 24–36)

## 2023-08-03 MED ORDER — OXYCODONE HCL 5 MG PO TABS
5.0000 mg | ORAL_TABLET | Freq: Three times a day (TID) | ORAL | Status: DC | PRN
  Administered 2023-08-04 – 2023-08-07 (×7): 5 mg via ORAL
  Filled 2023-08-03 (×7): qty 1

## 2023-08-03 MED ORDER — LEVOTHYROXINE SODIUM 50 MCG PO TABS
50.0000 ug | ORAL_TABLET | Freq: Every day | ORAL | Status: DC
  Administered 2023-08-04 – 2023-08-08 (×5): 50 ug via ORAL
  Filled 2023-08-03 (×5): qty 1

## 2023-08-03 MED ORDER — KETOROLAC TROMETHAMINE 15 MG/ML IJ SOLN
15.0000 mg | Freq: Once | INTRAMUSCULAR | Status: AC
Start: 2023-08-03 — End: 2023-08-03
  Administered 2023-08-03: 15 mg via INTRAVENOUS
  Filled 2023-08-03: qty 1

## 2023-08-03 MED ORDER — ONDANSETRON HCL 4 MG/2ML IJ SOLN
4.0000 mg | Freq: Once | INTRAMUSCULAR | Status: AC
  Administered 2023-08-03: 4 mg via INTRAVENOUS
  Filled 2023-08-03: qty 2

## 2023-08-03 MED ORDER — ONDANSETRON HCL 4 MG PO TABS: 4.0000 mg | ORAL_TABLET | Freq: Four times a day (QID) | ORAL | Status: DC | PRN

## 2023-08-03 MED ORDER — TAMSULOSIN HCL 0.4 MG PO CAPS
0.4000 mg | ORAL_CAPSULE | Freq: Two times a day (BID) | ORAL | Status: DC
  Administered 2023-08-03 – 2023-08-08 (×10): 0.4 mg via ORAL
  Filled 2023-08-03 (×10): qty 1

## 2023-08-03 MED ORDER — ATORVASTATIN CALCIUM 40 MG PO TABS
40.0000 mg | ORAL_TABLET | Freq: Every day | ORAL | Status: DC
  Administered 2023-08-03 – 2023-08-07 (×5): 40 mg via ORAL
  Filled 2023-08-03 (×5): qty 1

## 2023-08-03 MED ORDER — CYCLOSPORINE 0.05 % OP EMUL
1.0000 [drp] | Freq: Two times a day (BID) | OPHTHALMIC | Status: DC
  Administered 2023-08-03 – 2023-08-08 (×10): 1 [drp] via OPHTHALMIC
  Filled 2023-08-03 (×10): qty 30

## 2023-08-03 MED ORDER — SODIUM CHLORIDE 0.9 % IV BOLUS
1000.0000 mL | Freq: Once | INTRAVENOUS | Status: AC
  Administered 2023-08-03: 1000 mL via INTRAVENOUS

## 2023-08-03 MED ORDER — SODIUM CHLORIDE 0.9 % IV SOLN
1.0000 g | Freq: Once | INTRAVENOUS | Status: AC
  Administered 2023-08-03: 1 g via INTRAVENOUS
  Filled 2023-08-03: qty 10

## 2023-08-03 MED ORDER — LIOTHYRONINE SODIUM 5 MCG PO TABS
5.0000 ug | ORAL_TABLET | Freq: Every morning | ORAL | Status: DC
  Administered 2023-08-04 – 2023-08-08 (×5): 5 ug via ORAL
  Filled 2023-08-03 (×6): qty 1

## 2023-08-03 MED ORDER — OXYBUTYNIN CHLORIDE ER 10 MG PO TB24
10.0000 mg | ORAL_TABLET | Freq: Every day | ORAL | Status: DC
  Administered 2023-08-04 – 2023-08-06 (×3): 10 mg via ORAL
  Filled 2023-08-03 (×3): qty 1

## 2023-08-03 MED ORDER — ACETAMINOPHEN 650 MG RE SUPP: 650.0000 mg | Freq: Four times a day (QID) | RECTAL | Status: DC | PRN

## 2023-08-03 MED ORDER — ACETAMINOPHEN 325 MG PO TABS
650.0000 mg | ORAL_TABLET | Freq: Four times a day (QID) | ORAL | Status: DC | PRN
  Administered 2023-08-04 – 2023-08-07 (×3): 650 mg via ORAL
  Filled 2023-08-03 (×3): qty 2

## 2023-08-03 MED ORDER — PHENAZOPYRIDINE HCL 100 MG PO TABS: 100.0000 mg | ORAL_TABLET | Freq: Three times a day (TID) | ORAL | Status: DC | PRN

## 2023-08-03 MED ORDER — URELLE 81 MG PO TABS
1.0000 | ORAL_TABLET | Freq: Four times a day (QID) | ORAL | Status: DC | PRN
  Administered 2023-08-05 – 2023-08-07 (×4): 81 mg via ORAL
  Filled 2023-08-03 (×5): qty 1

## 2023-08-03 MED ORDER — SENNOSIDES-DOCUSATE SODIUM 8.6-50 MG PO TABS
1.0000 | ORAL_TABLET | Freq: Every evening | ORAL | Status: DC | PRN
  Administered 2023-08-05: 1 via ORAL
  Filled 2023-08-03: qty 1

## 2023-08-03 MED ORDER — PANTOPRAZOLE SODIUM 40 MG PO TBEC
40.0000 mg | DELAYED_RELEASE_TABLET | Freq: Every day | ORAL | Status: DC
  Administered 2023-08-04 – 2023-08-08 (×5): 40 mg via ORAL
  Filled 2023-08-03 (×5): qty 1

## 2023-08-03 MED ORDER — MORPHINE SULFATE (PF) 4 MG/ML IV SOLN
4.0000 mg | Freq: Once | INTRAVENOUS | Status: AC
  Administered 2023-08-03: 4 mg via INTRAVENOUS
  Filled 2023-08-03: qty 1

## 2023-08-03 MED ORDER — METOPROLOL TARTRATE 25 MG PO TABS
25.0000 mg | ORAL_TABLET | Freq: Two times a day (BID) | ORAL | Status: DC
  Administered 2023-08-03 – 2023-08-08 (×10): 25 mg via ORAL
  Filled 2023-08-03 (×10): qty 1

## 2023-08-03 MED ORDER — ONDANSETRON HCL 4 MG/2ML IJ SOLN: 4.0000 mg | Freq: Four times a day (QID) | INTRAMUSCULAR | Status: DC | PRN

## 2023-08-03 MED ORDER — ENOXAPARIN SODIUM 40 MG/0.4ML IJ SOSY
40.0000 mg | PREFILLED_SYRINGE | INTRAMUSCULAR | Status: DC
  Administered 2023-08-03 – 2023-08-07 (×5): 40 mg via SUBCUTANEOUS
  Filled 2023-08-03 (×5): qty 0.4

## 2023-08-03 MED ORDER — GABAPENTIN 300 MG PO CAPS
300.0000 mg | ORAL_CAPSULE | Freq: Three times a day (TID) | ORAL | Status: DC
  Administered 2023-08-03 – 2023-08-08 (×14): 300 mg via ORAL
  Filled 2023-08-03 (×14): qty 1

## 2023-08-03 MED ORDER — URO-MP 118 MG PO CAPS: 1.0000 | ORAL_CAPSULE | Freq: Four times a day (QID) | ORAL | Status: DC | PRN

## 2023-08-03 MED ORDER — ACETAMINOPHEN 500 MG PO TABS
1000.0000 mg | ORAL_TABLET | Freq: Once | ORAL | Status: AC
  Administered 2023-08-03: 1000 mg via ORAL
  Filled 2023-08-03: qty 2

## 2023-08-03 NOTE — ED Provider Notes (Signed)
Accepted handoff at shift change from [facility]. Please see prior provider note for more detail.   Briefly: Patient is 78 y.o.   DDX: concern for Post op cystoscopy 2/2 chronic suprapubic pain. Suprapubic cath new. Fever chills. Suspect cystitis. Need to check urine. Likely okay to discharge on ABX -- if persistently hypotensive may need obs admission.  Plan: UA with nitrates, leukocytes, greater than 50 white blood cells and rare bacteria, white blood cell clumps, all consistent with acute urinary tract infection, RVP shows negative for COVID, flu, RSV., blood cultures and urine cultures are in process.  On reassessment the patient feels comfortable with discharge, will plan to discharge with antibiotics, and urology follow-up closely.  Patient understands agrees to plan.  Initially somewhat hypotensive, blood pressure 87/48, but improved on reassessment, 110/56.  On additional reassessment patient having some desaturation of oxygen on room air, may be slightly positional/related to sleeping, but did have to be placed on 2 L nasal cannula.  He is also persistently tachycardic around the low 100s.  Given combination of significant UTI, recent surgical procedure, hypotension on arrival I do think that he would benefit from hospital admission.  Spoke with Dr. Kirke Corin who agrees to admission.     [place] 08/03/23 1651    Glendora Score, MD 08/04/23 [phone]

## 2023-08-03 NOTE — H&P (Signed)
History and Physical  Ryan [name]:[id number] DOB: 10/01/1945 DOA: 08/03/2023  PCP: Kristian Covey, MD   Chief Complaint: Suprapubic pain, body aches  HPI: Ryan [name] is a 78 y.o. male with medical history significant for prostate cancer s/p cystoscopy with suprapubic catheter placement on 1/27, GERD, HLD, HTN, hypothyroidism, SVT, tachycardia, and lumbar radiculopathy who presented to the ED for evaluation of generalized weakness, chills and suprapubic pain.  Per spouse, patient had cystoscopy 2 days ago that showed signs of bladder ulcerations so she was discharged home with pyridine as well as oxycodone for pain. States patient became very lethargic after taking the pain medication.  Last night, he started having chills, muscle aches and fever.  Patient with also endorsed suprapubic pain, dysuria and generalized fatigue but no shortness of breath, chest pain, headache or dizziness.  ED Course: Found to be afebrile but hypotensive with BP of 87/48 on admission.  Labs significant for mild AKI with creatinine of 1.67, WBC of 14.1, normal lactic acid, negative COVID, flu and RSV test and UA showing large hemoglobinuria, moderate proteinuria, positive nitrite, small leuks, RBC >50, WBC >50, rare bacteria, mucus and WBC clumps.  Patient received a total of 1 L IV NS bolus x 3, Tylenol 1 g, IV Rocephin, IV Zofran 4 mg x 1, IV morphine 4 mg x 1 and IV Toradol 50 mg x 1. TRH was consulted for admission  Review of Systems: Please see HPI for pertinent positives and negatives. A complete 10 system review of systems are otherwise negative.  Past Medical History:  Diagnosis Date   Arthritis 03/28/2013   Back pain    Cancer (HCC) 2014   prostate cancer   Cataracts, bilateral    Dysrhythmia    occasional PAC- asymptomatic, per MD pt cut back on caffeine   ED (erectile dysfunction) 12/18/2021   due to arterial insufficency   Fungal nail infection    GERD (gastroesophageal reflux  disease)    Hearing loss    wear hearing aids   HSV infection 2016   Hyperlipidemia    Hypertension    no meds, dx yrs ago per patient but normal last several yrs   Hypertensive retinopathy    OU   Hypothyroidism    Personal history of other diseases of the digestive system    Personal history of other diseases of the nervous system and sense organs 2014   Personal history of other endocrine, nutritional and metabolic disease 8119   Pneumonia    x 1   Primary testicular hypogonadism 2017   Retarded ejaculation 2021   Sleep apnea 2014   history of, did not qualify for cpap   Past Surgical History:  Procedure Laterality Date   ankle surgery trauma Right    CATARACT EXTRACTION Left 11/29/2018   Dr. Nile Riggs   COLONOSCOPY     COSMETIC SURGERY     loose skin removed around neck area   CYSTOSCOPY N/A 01/31/2023   Procedure: CYSTOSCOPY;  Surgeon: Jannifer Hick, MD;  Location: [facility];  Service: Urology;  Laterality: N/A;   eye lid surgery     EYE SURGERY     fungal nail     LASIK Bilateral    PARS PLANA VITRECTOMY Left 03/01/2019   Procedure: PARS PLANA VITRECTOMY WITH 25 GAUGE;  Surgeon: Rennis Chris, MD;  Location: [facility] OR;  Service: Ophthalmology;  Laterality: Left;   PROSTATE BIOPSY  06/21/2022   2020, 2018   RADIOACTIVE SEED  IMPLANT N/A 01/31/2023   Procedure: RADIOACTIVE SEED IMPLANT/BRACHYTHERAPY IMPLANT;  Surgeon: Jannifer Hick, MD;  Location: [facility];  Service: Urology;  Laterality: N/A;  90 MINUTES NEEDED FOR CASE   REMOVAL RETAINED LENS Left 03/01/2019   Procedure: REMOVAL OF RETAINED LENS MATERIALS LEFT EYE;  Surgeon: Rennis Chris, MD;  Location: [facility] OR;  Service: Ophthalmology;  Laterality: Left;   SPACE OAR INSTILLATION N/A 01/31/2023   Procedure: SPACE OAR INSTILLATION;  Surgeon: Jannifer Hick, MD;  Location: [facility];  Service: Urology;  Laterality: N/A;   VASECTOMY  1981   WISDOM TOOTH EXTRACTION     Social  History:  reports that he quit smoking about 42 years ago. His smoking use included cigarettes. He has never used smokeless tobacco. He reports current alcohol use of about 3.0 - 4.0 standard drinks of alcohol per week. He reports that he does not use drugs.  Allergies  Allergen Reactions   Bee Venom     Other reaction(s): Drowsy   Dust Mite Extract     UNSPECIFIED REACTION    Grass Extracts [Gramineae Pollens]     UNSPECIFIED REACTION     Family History  Problem Relation Age of Onset   Heart disease Mother    Liver disease Father    Alcohol abuse Father    Cancer Father        hepatic cancer ?primary   Arthritis Sister    COPD Sister      Prior to Admission medications   Medication Sig Start Date End Date Taking? Authorizing Provider  Ascorbic Acid (VITAMIN C) 1000 MG tablet Take 1,000 mg by mouth daily.    [provider]  atorvastatin (LIPITOR) 40 MG tablet Take 1 tablet (40 mg total) by mouth daily. 07/25/23   Wendall Stade, MD  cephALEXin (KEFLEX) 500 MG capsule Take 1 capsule (500 mg total) by mouth 4 (four) times daily. 06/16/23   Bethann Berkshire, MD  Cholecalciferol (VITAMIN D) 1000 UNITS capsule Take 1,000 Units by mouth daily.    [provider]  ciprofloxacin (CIPRO) 500 MG tablet Take 1 tablet (500 mg total) by mouth 2 (two) times daily. Patient not taking: Reported on 04/26/2023 03/18/23   Bruning, Ashlyn, PA-C  Coenzyme Q10 100 MG capsule Take 100 mg by mouth daily.    [provider]  Cyanocobalamin (VITAMIN B-12 CR) 1500 MCG TBCR Take 1,500 mcg by mouth daily.    [provider]  docusate sodium (COLACE) 100 MG capsule Take 1 capsule (100 mg total) by mouth daily as needed for up to 30 doses. Patient not taking: Reported on 04/26/2023 01/31/23   Jannifer Hick, MD  EPINEPHrine 0.3 mg/0.3 mL IJ SOAJ injection INJECT 0.3 ML INTRAMUSCULARLY ONCE AS NEEDED AS DIRECTED 03/04/21   [provider]  fluticasone (FLONASE) 50 MCG/ACT  nasal spray Place 1 spray into both nostrils daily as needed for allergies or rhinitis.    [provider]  levothyroxine (SYNTHROID) 50 MCG tablet TAKE ONE TABLET BY MOUTH DAILY FOR HYPOTHYROIDISM 09/04/21   [provider]  liothyronine (CYTOMEL) 5 MCG tablet Take 5 mcg by mouth daily.    [provider]  loratadine (CLARITIN) 10 MG tablet Take 10 mg by mouth daily as needed for allergies.     [provider]  MAGNESIUM CITRATE PO Take 400 mg by mouth daily.     [provider]  metoprolol tartrate (LOPRESSOR) 25 MG tablet Take 1 tablet (25  mg total) by mouth 2 (two) times daily. 02/02/23   Wendall Stade, MD  oxybutynin (DITROPAN XL) 5 MG 24 hr tablet Take 1 tablet (5 mg total) by mouth at bedtime. 03/16/23   Bruning, Ashlyn, PA-C  pantoprazole (PROTONIX) 40 MG tablet TAKE 1 TABLET BY MOUTH EVERY DAY 07/04/23   Hilarie Fredrickson, MD  phenazopyridine (PYRIDIUM) 200 MG tablet Take 1 tablet (200 mg total) by mouth 3 (three) times daily as needed for pain. 03/16/23   Bruning, Ashlyn, PA-C  RESTASIS 0.05 % ophthalmic emulsion Place 1 drop into the left eye 2 (two) times daily. 10/30/20   [provider]  tadalafil (CIALIS) 5 MG tablet Take 5 mg by mouth daily as needed for erectile dysfunction.  08/01/18   [provider]  tamsulosin (FLOMAX) 0.4 MG CAPS capsule Take 1 capsule (0.4 mg total) by mouth daily. 03/16/23   Bruning, Ashlyn, PA-C  valACYclovir (VALTREX) 500 MG tablet TAKE ONE TABLET BY MOUTH TWICE DAILY FOR 3 DAYS AS NEEDED FOR HERPES FLARE 08/24/21   Burchette, Elberta Fortis, MD  vitamin E 400 UNIT capsule Take 400 Units by mouth daily.    [provider]  Zinc 50 MG CAPS Take 50 mg by mouth daily.    [provider]    Physical Exam: BP 125/61   Pulse (!) 112   Temp (!) 102.7 F (39.3 C) (Oral)   Resp (!) 25   Ht 5\' 8"  (1.727 m)   Wt 62.3 kg   SpO2 96%   BMI 20.89 kg/m  General: Pleasant, acutely ill elderly man  laying in bed. No acute distress. HEENT: [place]/AT. Anicteric sclera. Dry mucous membrane. CV: Tachycardic.  Regular rhythm. No murmurs, rubs, or gallops. No LE edema Pulmonary: Lungs CTAB. Normal effort. No wheezing or rales. Abdominal: Soft, nondistended.  Mild TTP of the suprapubic area. Normal bowel sounds. Extremities: Palpable radial and DP pulses. Normal ROM. GU: Suprapubic catheter stable in place draining pinkish urine Skin: Warm and dry. No obvious rash or lesions.  Decreased skin turgor. Neuro: A&Ox3. Moves all extremities. Normal sensation to light touch. No focal deficit. Psych: Normal mood and affect          Labs on Admission:  Basic Metabolic Panel: Recent Labs  Lab 08/03/23 1228 08/03/23 1238 08/03/23 1459  NA 136 138 137  K 4.0 3.8 3.8  CL 100 101 103  CO2 26  --   --   GLUCOSE 97 95 99  BUN 23 22 23   CREATININE 1.67* 1.90* 1.70*  CALCIUM 9.6  --   --    Liver Function Tests: Recent Labs  Lab 08/03/23 1228  AST 90*  ALT 65*  ALKPHOS 113  BILITOT 1.5*  PROT 7.2  ALBUMIN 4.2   No results for input(s): "LIPASE", "AMYLASE" in the last 168 hours. No results for input(s): "AMMONIA" in the last 168 hours. CBC: Recent Labs  Lab 08/03/23 1228 08/03/23 1238 08/03/23 1459  WBC 14.1*  --   --   NEUTROABS 11.9*  --   --   HGB 14.0 14.6 13.6  HCT 42.9 43.0 40.0  MCV 90.1  --   --   PLT 201  --   --    Cardiac Enzymes: No results for input(s): "CKTOTAL", "CKMB", "CKMBINDEX", "TROPONINI" in the last 168 hours. BNP (last 3 results) No results for input(s): "BNP" in the last 8760 hours.  ProBNP (last 3 results) No results for input(s): "PROBNP" in the last 8760 hours.  CBG: No results for input(s): "GLUCAP" in the last 168 hours.  Radiological Exams on Admission: DG Chest Port 1 View Result Date: 08/03/2023 CLINICAL DATA:  Sepsis, fever EXAM: PORTABLE CHEST 1 VIEW COMPARISON:  08/21/2021, 08/13/2022 FINDINGS: The heart size and mediastinal contours are  within normal limits. No focal airspace consolidation, pleural effusion, or pneumothorax. The visualized skeletal structures are unremarkable. IMPRESSION: No active disease. Electronically Signed   By: Duanne Guess D.O.   On: 08/03/2023 14:24   Assessment/Plan Ryan [name] is a 78 y.o. male with medical history significant for  prostate cancer s/p cystoscopy with suprapubic catheter placement on 1/27, GERD, HLD, HTN, hypothyroidism, SVT, tachycardia, and lumbar radiculopathy who presented to the ED for evaluation of generalized weakness, chills and suprapubic pain and admitted for complicated UTI.  # Complicated UTI # Chronic suprapubic pain Patient with history of prostate cancer status post recent cystoscopy now presenting with suprapubic pain and systemic signs of infection.  UA shows evidence of infection.  Spiked a fever of 102.7 while in the ED. -Continue IV Rocephin -Continue suprapubic catheter -Follow-up blood culture, urine culture -Continue oxybutynin -Resume home PRN Pyridium and oxycodone -Trend CBC, fever curve  # AKI Slight bump in creatinine to 1.67 from 1.32 28-month ago. This is in the setting of dehydration from his urinary infection. Has received 3 L of IV fluids in the ED.  BP improved with SBP in the 100s to 120s. -Follow-up morning BMP -Avoid nephrotoxic's agents  # Prostate cancer Had a recent cystoscopy status post placement of a suprapubic catheter. -Continue tamsulosin -Follow-up with urology in the outpatient  # Hypothyroidism -Continue Synthroid and Cytomel -Follow-up repeat TSH  # GERD -Continue Protonix  # Hx of SVT # Tachycardia EKG today shows sinus tachycardia with a rate of 109. -Telemetry -Continue Toprol-XL  # HLD -Continue atorvastatin  # Lumbar radiculopathy -Continue gabapentin   DVT prophylaxis: Lovenox     Code Status: Limited: Do not attempt resuscitation (DNR) -DNR-LIMITED -Do Not Intubate/DNI   Consults called:  None  Family Communication: Discussed admission with spouse on the phone  Severity of Illness: The appropriate patient status for this patient is OBSERVATION. Observation status is judged to be reasonable and necessary in order to provide the required intensity of service to ensure the patient's safety. The patient's presenting symptoms, physical exam findings, and initial radiographic and laboratory data in the context of their medical condition is felt to place them at decreased risk for further clinical deterioration. Furthermore, it is anticipated that the patient will be medically stable for discharge from the hospital within 2 midnights of admission.   Level of care: Telemetry   Steffanie Rainwater, MD 08/03/2023, 5:43 PM [facility] Pager: [phone] Isaiah 41:10   If 7PM-7AM, please contact night-coverage [email or web address] Password [email or web address]

## 2023-08-03 NOTE — ED Provider Notes (Signed)
[place] EMERGENCY DEPARTMENT AT [facility] Provider Note   CSN: [id number] Arrival date & time: 08/03/23  1155     History  Chief Complaint  Patient presents with   Post-op Problem    Ryan [name] is a 78 y.o. male patient with past medical history of prostate cancer and status post cystoscopy with suprapubic catheter placement 2 days ago presenting to emergency room with fevers, muscle aches for 1 day.  He endorses suprapubic pain which is not greatly changed since July.  On cystoscopy, sign of bladder ulcerations.  He was given Pyridium as well as oxycodone.  Continues to endorse suprapubic pain.  Has not noticed any drainage or changes with the incision.  Denies any flulike symptoms chest pain cough or shortness of breath.  HPI     Home Medications Prior to Admission medications   Medication Sig Start Date End Date Taking? Authorizing Provider  Ascorbic Acid (VITAMIN C) 1000 MG tablet Take 1,000 mg by mouth daily.    [provider]  atorvastatin (LIPITOR) 40 MG tablet Take 1 tablet (40 mg total) by mouth daily. 07/25/23   Wendall Stade, MD  cephALEXin (KEFLEX) 500 MG capsule Take 1 capsule (500 mg total) by mouth 4 (four) times daily. 06/16/23   Bethann Berkshire, MD  Cholecalciferol (VITAMIN D) 1000 UNITS capsule Take 1,000 Units by mouth daily.    [provider]  ciprofloxacin (CIPRO) 500 MG tablet Take 1 tablet (500 mg total) by mouth 2 (two) times daily. Patient not taking: Reported on 04/26/2023 03/18/23   Bruning, Ashlyn, PA-C  Coenzyme Q10 100 MG capsule Take 100 mg by mouth daily.    [provider]  Cyanocobalamin (VITAMIN B-12 CR) 1500 MCG TBCR Take 1,500 mcg by mouth daily.    [provider]  docusate sodium (COLACE) 100 MG capsule Take 1 capsule (100 mg total) by mouth daily as needed for up to 30 doses. Patient not taking: Reported on 04/26/2023 01/31/23   Jannifer Hick, MD  EPINEPHrine 0.3 mg/0.3 mL IJ SOAJ  injection INJECT 0.3 ML INTRAMUSCULARLY ONCE AS NEEDED AS DIRECTED 03/04/21   [provider]  fluticasone (FLONASE) 50 MCG/ACT nasal spray Place 1 spray into both nostrils daily as needed for allergies or rhinitis.    [provider]  levothyroxine (SYNTHROID) 50 MCG tablet TAKE ONE TABLET BY MOUTH DAILY FOR HYPOTHYROIDISM 09/04/21   [provider]  liothyronine (CYTOMEL) 5 MCG tablet Take 5 mcg by mouth daily.    [provider]  loratadine (CLARITIN) 10 MG tablet Take 10 mg by mouth daily as needed for allergies.     [provider]  MAGNESIUM CITRATE PO Take 400 mg by mouth daily.     [provider]  metoprolol tartrate (LOPRESSOR) 25 MG tablet Take 1 tablet (25 mg total) by mouth 2 (two) times daily. 02/02/23   Wendall Stade, MD  oxybutynin (DITROPAN XL) 5 MG 24 hr tablet Take 1 tablet (5 mg total) by mouth at bedtime. 03/16/23   Bruning, Ashlyn, PA-C  pantoprazole (PROTONIX) 40 MG tablet TAKE 1 TABLET BY MOUTH EVERY DAY 07/04/23   Hilarie Fredrickson, MD  phenazopyridine (PYRIDIUM) 200 MG tablet Take 1 tablet (200 mg total) by mouth 3 (three) times daily as needed for pain. 03/16/23   Bruning, Ashlyn, PA-C  RESTASIS 0.05 % ophthalmic emulsion Place 1 drop into the left eye 2 (two) times daily. 10/30/20   [provider]  tadalafil (CIALIS)  5 MG tablet Take 5 mg by mouth daily as needed for erectile dysfunction.  08/01/18   [provider]  tamsulosin (FLOMAX) 0.4 MG CAPS capsule Take 1 capsule (0.4 mg total) by mouth daily. 03/16/23   Bruning, Ashlyn, PA-C  valACYclovir (VALTREX) 500 MG tablet TAKE ONE TABLET BY MOUTH TWICE DAILY FOR 3 DAYS AS NEEDED FOR HERPES FLARE 08/24/21   Burchette, Elberta Fortis, MD  vitamin E 400 UNIT capsule Take 400 Units by mouth daily.    [provider]  Zinc 50 MG CAPS Take 50 mg by mouth daily.    [provider]      Allergies    Bee venom, Dust mite extract, and Grass extracts [gramineae  pollens]    Review of Systems   Review of Systems  Constitutional:  Positive for chills.    Physical Exam Updated Vital Signs BP (!) 87/48 (BP Location: Left Arm)   Pulse 91   Temp 98.1 F (36.7 C) (Oral)   Resp 18   Ht 5\' 8"  (1.727 m)   Wt 62.3 kg   SpO2 94%   BMI 20.89 kg/m  Physical Exam Vitals and nursing note reviewed.  Constitutional:      General: He is not in acute distress.    Appearance: He is not toxic-appearing.  HENT:     Head: Normocephalic and atraumatic.  Eyes:     General: No scleral icterus.    Conjunctiva/sclera: Conjunctivae normal.  Cardiovascular:     Rate and Rhythm: Normal rate and regular rhythm.     Pulses: Normal pulses.     Heart sounds: Normal heart sounds.  Pulmonary:     Effort: Pulmonary effort is normal. No respiratory distress.     Breath sounds: Normal breath sounds.  Abdominal:     General: Abdomen is flat. Bowel sounds are normal.     Palpations: Abdomen is soft.     Tenderness: There is no abdominal tenderness.  Skin:    General: Skin is warm and dry.     Findings: No lesion.     Comments: Prepubic catheter is in place draining, dark orange urine.  Incision is clean dry with no drainage.  Neurological:     General: No focal deficit present.     Mental Status: He is alert and oriented to person, place, and time. Mental status is at baseline.     ED Results / Procedures / Treatments   Labs (all labs ordered are listed, but only abnormal results are displayed) Labs Reviewed  COMPREHENSIVE METABOLIC PANEL - Abnormal; Notable for the following components:      Result Value   Creatinine, Ser 1.67 (*)    AST 90 (*)    ALT 65 (*)    Total Bilirubin 1.5 (*)    GFR, Estimated 42 (*)    All other components within normal limits  CBC WITH DIFFERENTIAL/PLATELET - Abnormal; Notable for the following components:   WBC 14.1 (*)    Neutro Abs 11.9 (*)    Abs Immature Granulocytes 0.12 (*)    All other components within normal  limits  URINALYSIS, W/ REFLEX TO CULTURE (INFECTION SUSPECTED) - Abnormal; Notable for the following components:   Color, Urine AMBER (*)    APPearance CLOUDY (*)    Hgb urine dipstick LARGE (*)    Protein, ur 100 (*)    Nitrite POSITIVE (*)    Leukocytes,Ua SMALL (*)    Bacteria, UA RARE (*)    Non Squamous  Epithelial 0-5 (*)    All other components within normal limits  I-STAT CHEM 8, ED - Abnormal; Notable for the following components:   Creatinine, Ser 1.90 (*)    All other components within normal limits  I-STAT CHEM 8, ED - Abnormal; Notable for the following components:   Creatinine, Ser 1.70 (*)    All other components within normal limits  CULTURE, BLOOD (ROUTINE X 2)  RESP PANEL BY RT-PCR (RSV, FLU A&B, COVID)  RVPGX2  CULTURE, BLOOD (ROUTINE X 2)  URINE CULTURE  PROTIME-INR  APTT  I-STAT CG4 LACTIC ACID, ED  I-STAT CG4 LACTIC ACID, ED    EKG None  Radiology DG Chest Port 1 View Result Date: 08/03/2023 CLINICAL DATA:  Sepsis, fever EXAM: PORTABLE CHEST 1 VIEW COMPARISON:  08/21/2021, 08/13/2022 FINDINGS: The heart size and mediastinal contours are within normal limits. No focal airspace consolidation, pleural effusion, or pneumothorax. The visualized skeletal structures are unremarkable. IMPRESSION: No active disease. Electronically Signed   By: Duanne Guess D.O.   On: 08/03/2023 14:24    Procedures Procedures    Medications Ordered in ED Medications  sodium chloride 0.9 % bolus 1,000 mL (has no administration in time range)  morphine (PF) 4 MG/ML injection 4 mg (has no administration in time range)  ondansetron (ZOFRAN) injection 4 mg (has no administration in time range)  cefTRIAXone (ROCEPHIN) 1 g in sodium chloride 0.9 % 100 mL IVPB (has no administration in time range)  sodium chloride 0.9 % bolus 1,000 mL (1,000 mLs Intravenous New Bag/Given 08/03/23 1235)    ED Course/ Medical Decision Making/ A&P Clinical Course as of 08/03/23 1802  Wed Aug 03, 2023  1340 BP 110 systolic at this time [MT]  1342 This is a 78 year old male presenting to the ED 2 days after suprapubic catheter placement, complaining of chills, fevers, chronic suprapubic pain.  Patient reports had ulcerations on his bladder and chronic pain preceding this, for which she has taken oxycodone.  However they are concerned about his chills today.  Patient arrives afebrile in the ED, heart rate normal, initial blood pressure was 87/48, but improved with fluids to 110 systolic, close to baseline.  Labs show a normal lactate.  White blood cell count is elevated 14.1.  UA and urine culture pending but the patient does take Pyridium which may affect urinalysis results.  Urine appears dark orange.  Clinic I think it would be reasonable to treat for potential bladder infection with the symptoms of chills at home.  From his last positive urine culture, Pseudomonas and 2024, this was susceptible to the cephalosporin family.  Patient and his family would strongly prefer cephalosporins as ciprofloxacin tends to cause bad diarrhea.  We will start him on Rocephin in the ED. [MT]  1344 Blood cultures were ordered from initial assessment due to low blood pressure, and can be followed up on.  Options were discussed for observation stay in the hospital versus close follow-up - my suspicion for bacteremia remains relatively low with normal lactate and BP normalized, but cannot be completely excluded [MT]  1345 Flu/covid test also pending  [MT]    Clinical Course User Index [MT] Trifan, Kermit Balo, MD                                 Medical Decision Making Amount and/or Complexity of Data Reviewed Labs: ordered. Radiology: ordered.  Risk OTC drugs. Prescription drug management.  Decision regarding hospitalization.   Aeric E Stauffer 78 y.o. presented today for suprapubic pain. Working Ddx: includes, but not limited to, gastroenteritis, colitis, appendicitis, pancreatitis, nephrolithiasis, UTI,  pyelonephritis, testicular torsion, dehydration, electrolyte abnormalities   R/o DDx: These are considered less likely than current impression due to history of present illness, physical exam, labs/imaging findings.  Review of prior external notes: 07/31/22  Pmhx: prostate cancer, suprapubic cath placement   Unique Tests and My Interpretation:  CBC with leukocytosis of 14, no anemia.  CMP with creatinine 1.67 which appears to be at patient's baseline.  No anion gap.  Lactic is 1.8.  Urine needs to be collected will send for urine culture.  Blood cultures obtained.  Respiratory panel pending.  Imaging:  Chest x-ray to eval for source of fever  Problem List / ED Course / Critical interventions / Medication management  Patient presenting with post-op fever. Hypotensive on arrival, with positive response to 1L. Not tachycardic, no current fever. Patient well appearing. Patients wife reports that he was given Tylenol at 9am and fever went away. When reassessed at 3pm, patient starting to have low grade fever again. Given mild leukocytosis, initial hypotension, did obtain blood cultures due to probable UTI post suprapubic cath placement. Initial lactic 1.8 Given patients initial positive response to fluids discussed with patient and family discharge version observation admission, patient and family prefer to go home, however would like to obtain UA result and reassess blood pressure prior to discharge. Patient signed off to oncoming PA pending UA and reassessment.  I ordered medication including morphine, zofran  Reevaluation of the patient after these medicines showed that the patient improved Patients vitals assessed. Upon arrival patient is hypotensive, responded to 1L fluid bolus.  I have reviewed the patients home medicines and have made adjustments as needed   Consult: None  Plan: Admit versus discharge pending reassessment of vitals and BP Signed off to [facility] PA-c         Final  Clinical Impression(s) / ED Diagnoses Final diagnoses:  None    Rx / DC Orders ED Discharge Orders     None         Smitty Knudsen, PA-C 08/03/23 1811    Terald Sleeper, MD 08/04/23 [phone]

## 2023-08-03 NOTE — ED Provider Triage Note (Signed)
Emergency Medicine Provider Triage Evaluation Note  EDGER HUSAIN , a 78 y.o. male  was evaluated in triage.  Pt complains of status post suprapubic catheter placement 08/01/2023 at [facility].  He has had having fever yesterday, with temperature of 102 F.  Had fever this morning as well that went down with Advil and Tylenol.  He reports generalized weakness, generalized muscle aches fevers and chills.  He is also having some suprapubic pain.  Does not know any URI-like symptoms chest pain or shortness of breath.  On arrival to emergency room he is hypotensive.  Given fevers at home initiated sepsis order set.  Review of Systems  Positive: Fevers Negative: Chest pain, cough  Physical Exam  BP (!) 87/48 (BP Location: Left Arm)   Pulse 91   Temp 98.1 F (36.7 C) (Oral)   Resp 18   Ht 5\' 8"  (1.727 m)   Wt 62.3 kg   SpO2 94%   BMI 20.89 kg/m  Gen:   Awake, no distress   Resp:  Normal effort  MSK:   Moves extremities without difficulty  Other:  Suprapubic tenderness, wound is clean dry and intact  Medical Decision Making  Medically screening exam initiated at 12:28 PM.  Appropriate orders placed.  Estanislado Spire Wirz was informed that the remainder of the evaluation will be completed by another provider, this initial triage assessment does not replace that evaluation, and the importance of remaining in the ED until their evaluation is complete.     Smitty Knudsen, PA-C 08/03/23 1236

## 2023-08-03 NOTE — ED Notes (Signed)
If any information is needed or any updates please call his wife, Elihu Milstein [phone] cell phone

## 2023-08-03 NOTE — ED Notes (Signed)
Pt placed on O2 nasal cannula 2 lpm

## 2023-08-04 ENCOUNTER — Encounter (HOSPITAL_COMMUNITY): Payer: Self-pay | Admitting: Student

## 2023-08-04 DIAGNOSIS — M5416 Radiculopathy, lumbar region: Secondary | ICD-10-CM | POA: Diagnosis present

## 2023-08-04 DIAGNOSIS — Z800 Family history of malignant neoplasm of digestive organs: Secondary | ICD-10-CM | POA: Diagnosis not present

## 2023-08-04 DIAGNOSIS — E785 Hyperlipidemia, unspecified: Secondary | ICD-10-CM | POA: Diagnosis present

## 2023-08-04 DIAGNOSIS — Y828 Other medical devices associated with adverse incidents: Secondary | ICD-10-CM | POA: Diagnosis present

## 2023-08-04 DIAGNOSIS — E860 Dehydration: Secondary | ICD-10-CM | POA: Diagnosis present

## 2023-08-04 DIAGNOSIS — N3001 Acute cystitis with hematuria: Secondary | ICD-10-CM | POA: Diagnosis present

## 2023-08-04 DIAGNOSIS — Z79890 Hormone replacement therapy (postmenopausal): Secondary | ICD-10-CM | POA: Diagnosis not present

## 2023-08-04 DIAGNOSIS — E039 Hypothyroidism, unspecified: Secondary | ICD-10-CM | POA: Diagnosis present

## 2023-08-04 DIAGNOSIS — Z66 Do not resuscitate: Secondary | ICD-10-CM | POA: Diagnosis present

## 2023-08-04 DIAGNOSIS — A419 Sepsis, unspecified organism: Secondary | ICD-10-CM | POA: Diagnosis present

## 2023-08-04 DIAGNOSIS — Z8546 Personal history of malignant neoplasm of prostate: Secondary | ICD-10-CM | POA: Diagnosis not present

## 2023-08-04 DIAGNOSIS — H9190 Unspecified hearing loss, unspecified ear: Secondary | ICD-10-CM | POA: Diagnosis present

## 2023-08-04 DIAGNOSIS — M549 Dorsalgia, unspecified: Secondary | ICD-10-CM | POA: Diagnosis not present

## 2023-08-04 DIAGNOSIS — G8929 Other chronic pain: Secondary | ICD-10-CM | POA: Diagnosis present

## 2023-08-04 DIAGNOSIS — N3289 Other specified disorders of bladder: Secondary | ICD-10-CM | POA: Diagnosis present

## 2023-08-04 DIAGNOSIS — Z1152 Encounter for screening for COVID-19: Secondary | ICD-10-CM | POA: Diagnosis not present

## 2023-08-04 DIAGNOSIS — N179 Acute kidney failure, unspecified: Secondary | ICD-10-CM

## 2023-08-04 DIAGNOSIS — B965 Pseudomonas (aeruginosa) (mallei) (pseudomallei) as the cause of diseases classified elsewhere: Secondary | ICD-10-CM | POA: Diagnosis present

## 2023-08-04 DIAGNOSIS — T83518A Infection and inflammatory reaction due to other urinary catheter, initial encounter: Secondary | ICD-10-CM | POA: Diagnosis present

## 2023-08-04 DIAGNOSIS — Z8249 Family history of ischemic heart disease and other diseases of the circulatory system: Secondary | ICD-10-CM | POA: Diagnosis not present

## 2023-08-04 DIAGNOSIS — R531 Weakness: Secondary | ICD-10-CM | POA: Diagnosis not present

## 2023-08-04 DIAGNOSIS — Y842 Radiological procedure and radiotherapy as the cause of abnormal reaction of the patient, or of later complication, without mention of misadventure at the time of the procedure: Secondary | ICD-10-CM | POA: Diagnosis present

## 2023-08-04 DIAGNOSIS — C61 Malignant neoplasm of prostate: Secondary | ICD-10-CM | POA: Diagnosis not present

## 2023-08-04 DIAGNOSIS — I10 Essential (primary) hypertension: Secondary | ICD-10-CM | POA: Diagnosis present

## 2023-08-04 DIAGNOSIS — N390 Urinary tract infection, site not specified: Secondary | ICD-10-CM | POA: Diagnosis not present

## 2023-08-04 DIAGNOSIS — E876 Hypokalemia: Secondary | ICD-10-CM | POA: Diagnosis not present

## 2023-08-04 DIAGNOSIS — I471 Supraventricular tachycardia, unspecified: Secondary | ICD-10-CM | POA: Diagnosis present

## 2023-08-04 DIAGNOSIS — Z811 Family history of alcohol abuse and dependence: Secondary | ICD-10-CM | POA: Diagnosis not present

## 2023-08-04 DIAGNOSIS — K219 Gastro-esophageal reflux disease without esophagitis: Secondary | ICD-10-CM | POA: Diagnosis present

## 2023-08-04 DIAGNOSIS — Z87891 Personal history of nicotine dependence: Secondary | ICD-10-CM | POA: Diagnosis not present

## 2023-08-04 LAB — BASIC METABOLIC PANEL
Anion gap: 7 (ref 5–15)
BUN: 20 mg/dL (ref 8–23)
CO2: 22 mmol/L (ref 22–32)
Calcium: 8 mg/dL — ABNORMAL LOW (ref 8.9–10.3)
Chloride: 106 mmol/L (ref 98–111)
Creatinine, Ser: 1.45 mg/dL — ABNORMAL HIGH (ref 0.61–1.24)
GFR, Estimated: 50 mL/min — ABNORMAL LOW (ref >60)
Glucose, Bld: 110 mg/dL — ABNORMAL HIGH (ref 70–99)
Potassium: 3.5 mmol/L (ref 3.5–5.1)
Sodium: 135 mmol/L (ref 135–145)

## 2023-08-04 LAB — CBC
HCT: 36.4 % — ABNORMAL LOW (ref 39.0–52.0)
Hemoglobin: 11.7 g/dL — ABNORMAL LOW (ref 13.0–17.0)
MCH: 29.1 pg (ref 26.0–34.0)
MCHC: 32.1 g/dL (ref 30.0–36.0)
MCV: 90.5 fL (ref 80.0–100.0)
Platelets: 133 10*3/uL — ABNORMAL LOW (ref 150–400)
RBC: 4.02 MIL/uL — ABNORMAL LOW (ref 4.22–5.81)
RDW: 14.8 % (ref 11.5–15.5)
WBC: 9.3 10*3/uL (ref 4.0–10.5)
nRBC: 0 % (ref 0.0–0.2)

## 2023-08-04 LAB — LACTIC ACID, PLASMA: Lactic Acid, Venous: 0.9 mmol/L (ref 0.5–1.9)

## 2023-08-04 LAB — TSH: TSH: 0.255 u[IU]/mL — ABNORMAL LOW (ref 0.350–4.500)

## 2023-08-04 MED ORDER — ENSURE ENLIVE PO LIQD
237.0000 mL | Freq: Two times a day (BID) | ORAL | Status: DC
  Administered 2023-08-05 – 2023-08-08 (×6): 237 mL via ORAL

## 2023-08-04 MED ORDER — SODIUM CHLORIDE 0.9 % IV BOLUS
500.0000 mL | Freq: Once | INTRAVENOUS | Status: AC
  Administered 2023-08-04: 500 mL via INTRAVENOUS

## 2023-08-04 MED ORDER — POLYVINYL ALCOHOL 1.4 % OP SOLN: 1.0000 [drp] | OPHTHALMIC | Status: DC | PRN

## 2023-08-04 MED ORDER — ACETAMINOPHEN 500 MG PO TABS
1000.0000 mg | ORAL_TABLET | ORAL | Status: AC
  Administered 2023-08-04: 1000 mg via ORAL
  Filled 2023-08-04: qty 2

## 2023-08-04 MED ORDER — SODIUM CHLORIDE 0.9 % IV SOLN
1.0000 g | INTRAVENOUS | Status: DC
  Administered 2023-08-04: 1 g via INTRAVENOUS
  Filled 2023-08-04: qty 10

## 2023-08-04 NOTE — ED Notes (Signed)
Provider made aware of patient temperature

## 2023-08-04 NOTE — Progress Notes (Signed)
PROGRESS NOTE  Ryan [name] RUE:[id number] DOB: 1945-09-23 DOA: 08/03/2023 PCP: Kristian Covey, MD  Brief History   The patient is a 78 yr old man who presented to [facility] on 08/03/2023 with complaints of generalized weaknes, chills, and suprapubic pain. The patient had undergone cystoscopy on 08/01/2023 that demonstrated ulcerations of the bladder. He was discharge to home with pyridium and oxycodone. The patient became very lethargic after taking the oxycodone. The patient began having fever, chills, and myalgias on the night of 08/02/2023.   The patient carries a medical history significant for prostate cancer s/p cystoscopy with suprapubic catheter placement on 08/01/2023, GERD, HLD, HTN, Hypothyroidism, SVT, Tachycardia, and lumbar radiculopathy.  He was admitted to [facility] by Dr. Kirke Corin. UA is positive for UTI The patient is receiving IV rocephin.   A & P  Complicated UTI Chronic suprapubic pain Patient with history of prostate cancer status post recent cystoscopy now presenting with suprapubic pain and systemic signs of infection.  UA shows evidence of infection.  Spiked a fever of 102.7 while in the ED. -Continue IV Rocephin -Continue suprapubic catheter -Follow-up blood culture, urine culture -Continue oxybutynin -Resume home PRN Pyridium and oxycodone -Trend CBC, fever curve   AKI Slight bump in creatinine to 1.67 from 1.32 16-month ago. This is in the setting of dehydration from his urinary infection. Has received 3 L of IV fluids in the ED.  BP improved with SBP in the 100s to 120s. -Creatinine is improved from 1.7 to 1.45.  -Continue following creatinine, electrolytes, and volume status -Avoid nephrotoxic's agents   # Prostate cancer Had a recent cystoscopy status post placement of a suprapubic catheter. -Continue tamsulosin -Follow-up with urology in the outpatient   # Hypothyroidism -Continue Synthroid and Cytomel -Follow-up repeat TSH   # GERD -Continue Protonix    # Hx of SVT # Tachycardia EKG today shows sinus tachycardia with a rate of 109. -Telemetry -Continue Toprol-XL   # HLD -Continue atorvastatin   # Lumbar radiculopathy -Continue gabapentin    DVT prophylaxis: Lovenox      Code Status: Limited: Do not attempt resuscitation (DNR) -DNR-LIMITED -Do Not Intubate/DNI    Consults called: None   Family Communication: Discussed admission with spouse on the phone  I have seen and examined this patient myself. I have spent 34 minutes in his evaluation and care.  DVT prophylaxis: Lovenox Code Status: DNR-limited Family Communication: None available Disposition Plan: tbd    Marieme Mcmackin, DO [facility] Direct contact: see [email or web address]  7PM-7AM contact night coverage as above 08/04/2023, 1:57 PM  LOS: 0 days   Consultants  None  Procedures  None  Antibiotics   Anti-infectives (From admission, onward)    Start     Dose/Rate Route Frequency Ordered Stop   08/04/23 1400  cefTRIAXone (ROCEPHIN) 1 g in sodium chloride 0.9 % 100 mL IVPB        1 g 200 mL/hr over 30 Minutes Intravenous Every 24 hours 08/04/23 1353     08/03/23 2145  Urelle (URELLE/URISED) 81 MG tablet 81 mg        1 tablet Oral 4 times daily PRN 08/03/23 2145     08/03/23 1929  Uro-MP 118 MG CAPS 118 mg  Status:  Discontinued        1 capsule Oral 4 times daily PRN 08/03/23 1931 08/03/23 2145   08/03/23 1345  cefTRIAXone (ROCEPHIN) 1 g in sodium chloride 0.9 % 100 mL IVPB  1 g 200 mL/hr over 30 Minutes Intravenous  Once 08/03/23 1339 08/03/23 1526        Interval History/Subjective  The patient is lying quietly on the gurney in the ED. He is waiting for a room to become available upstairs.  Objective   Vitals:  Vitals:   08/04/23 1007 08/04/23 1343  BP: 108/60   Pulse: (!) 103   Resp: (!) 24   Temp: 97.9 F (36.6 C) (!) 103 F (39.4 C)  SpO2: 100%     Exam:  Constitutional:  The patient is awake, alert, and oriented x 3. No acute  distress. Respiratory:  No increased work of breathing. No wheezes, rales, or rhonchi No tactile fremitus Cardiovascular:  Regular rate and rhythm No murmurs, ectopy, or gallups. No lateral PMI. No thrills. Abdomen:  Abdomen is soft, non-tender, non-distended No hernias, masses, or organomegaly Normoactive bowel sounds.  Musculoskeletal:  No cyanosis, clubbing, or edema Skin:  No rashes, lesions, ulcers palpation of skin: no induration or nodules Neurologic:  CN 2-12 intact Sensation all 4 extremities intact Psychiatric:  Mental status Mood, affect appropriate Orientation to person, place, time  judgment and insight appear intact   I have personally reviewed the following:   Today's Data   Vitals:   08/04/23 1007 08/04/23 1343  BP: 108/60   Pulse: (!) 103   Resp: (!) 24   Temp: 97.9 F (36.6 C) (!) 103 F (39.4 C)  SpO2: 100%      Lab Data  CBC    Component Value Date/Time   WBC 9.3 08/04/2023 0421   RBC 4.02 (L) 08/04/2023 0421   HGB 11.7 (L) 08/04/2023 0421   HCT 36.4 (L) 08/04/2023 0421   PLT 133 (L) 08/04/2023 0421   MCV 90.5 08/04/2023 0421   MCH 29.1 08/04/2023 0421   MCHC 32.1 08/04/2023 0421   RDW 14.8 08/04/2023 0421   LYMPHSABS 1.0 08/03/2023 1228   MONOABS 1.0 08/03/2023 1228   EOSABS 0.1 08/03/2023 1228   BASOSABS 0.1 08/03/2023 1228      Latest Ref Rng & Units 08/04/2023    4:21 AM 08/03/2023    2:59 PM 08/03/2023   12:38 PM  BMP  Glucose 70 - 99 mg/dL 621  99  95   BUN 8 - 23 mg/dL 20  23  22    Creatinine 0.61 - 1.24 mg/dL 3.08  6.57  8.46   Sodium 135 - 145 mmol/L 135  137  138   Potassium 3.5 - 5.1 mmol/L 3.5  3.8  3.8   Chloride 98 - 111 mmol/L 106  103  101   CO2 22 - 32 mmol/L 22     Calcium 8.9 - 10.3 mg/dL 8.0        Micro Data   Results for orders placed or performed during the hospital encounter of 08/03/23  Blood Culture (routine x 2)     Status: None (Preliminary result)   Collection Time: 08/03/23 12:27 PM    Specimen: BLOOD RIGHT ARM  Result Value Ref Range Status   Specimen Description   Final    BLOOD RIGHT ARM Performed at [facility], [address] [place], [place] [address]    Special Requests   Final    BOTTLES DRAWN AEROBIC AND ANAEROBIC Blood Culture results may not be optimal due to an inadequate volume of blood received in culture bottles Performed at [facility], [address], [place], [place] [address]    Culture  Final    NO GROWTH < 24 HOURS Performed at [facility], [address] [place], [place] [address]    Report Status PENDING  Incomplete  Blood Culture (routine x 2)     Status: None (Preliminary result)   Collection Time: 08/03/23  2:30 PM   Specimen: BLOOD  Result Value Ref Range Status   Specimen Description   Final    BLOOD SITE NOT SPECIFIED Performed at [facility], [address] [place], [place] [address]    Special Requests   Final    BOTTLES DRAWN AEROBIC AND ANAEROBIC Blood Culture adequate volume Performed at [facility], [address], [place], [place] [address]    Culture   Final    NO GROWTH < 24 HOURS Performed at [facility], [address] [place], [place] [address]    Report Status PENDING  Incomplete  Urine Culture     Status: Abnormal (Preliminary result)   Collection Time: 08/03/23  3:16 PM   Specimen: Urine, Suprapubic  Result Value Ref Range Status   Specimen Description   Final    URINE, SUPRAPUBIC Performed at [facility], [address] [place], [place] [address]    Special Requests   Final    NONE Performed at [facility], [address], [place], [place] [address]    Culture 50,000 COLONIES/mL GRAM NEGATIVE RODS (A)  Final   Report Status PENDING  Incomplete  Resp panel by RT-PCR (RSV, Flu A&B, Covid) Peripheral     Status: None   Collection Time: 08/03/23  3:34 PM   Specimen: Peripheral; Nasal  Swab  Result Value Ref Range Status   SARS Coronavirus 2 by RT PCR NEGATIVE NEGATIVE Final    Comment: (NOTE) SARS-CoV-2 target nucleic acids are NOT DETECTED.  The SARS-CoV-2 RNA is generally detectable in upper respiratory specimens during the acute phase of infection. The lowest concentration of SARS-CoV-2 viral copies this assay can detect is 138 copies/mL. A negative result does not preclude SARS-Cov-2 infection and should not be used as the sole basis for treatment or other patient management decisions. A negative result may occur with  improper specimen collection/handling, submission of specimen other than nasopharyngeal swab, presence of viral mutation(s) within the areas targeted by this assay, and inadequate number of viral copies(<138 copies/mL). A negative result must be combined with clinical observations, patient history, and epidemiological information. The expected result is Negative.  Fact Sheet for Patients:  BloggerCourse.com  Fact Sheet for Healthcare Providers:  SeriousBroker.it  This test is no t yet approved or cleared by the [place] FDA and  has been authorized for detection and/or diagnosis of SARS-CoV-2 by FDA under an Emergency Use Authorization (EUA). This EUA will remain  in effect (meaning this test can be used) for the duration of the COVID-19 declaration under Section 564(b)(1) of the Act, 21 U.S.C.section 360bbb-3(b)(1), unless the authorization is terminated  or revoked sooner.       Influenza A by PCR NEGATIVE NEGATIVE Final   Influenza B by PCR NEGATIVE NEGATIVE Final    Comment: (NOTE) The Xpert Xpress SARS-CoV-2/FLU/RSV plus assay is intended as an aid in the diagnosis of influenza from Nasopharyngeal swab specimens and should not be used as a sole basis for treatment. Nasal washings and aspirates are unacceptable for Xpert Xpress SARS-CoV-2/FLU/RSV testing.  Fact Sheet for  Patients: BloggerCourse.com  Fact Sheet for Healthcare Providers: SeriousBroker.it  This test is  not yet approved or cleared by the [place] and has been authorized for detection and/or diagnosis of SARS-CoV-2 by FDA under an Emergency Use Authorization (EUA). This EUA will remain in effect (meaning this test can be used) for the duration of the COVID-19 declaration under Section 564(b)(1) of the Act, 21 U.S.C. section 360bbb-3(b)(1), unless the authorization is terminated or revoked.     Resp Syncytial Virus by PCR NEGATIVE NEGATIVE Final    Comment: (NOTE) Fact Sheet for Patients: BloggerCourse.com  Fact Sheet for Healthcare Providers: SeriousBroker.it  This test is not yet approved or cleared by the [place] FDA and has been authorized for detection and/or diagnosis of SARS-CoV-2 by FDA under an Emergency Use Authorization (EUA). This EUA will remain in effect (meaning this test can be used) for the duration of the COVID-19 declaration under Section 564(b)(1) of the Act, 21 U.S.C. section 360bbb-3(b)(1), unless the authorization is terminated or revoked.  Performed at [facility], [address] [place], [place] [address]      Scheduled Meds:  atorvastatin  40 mg Oral QHS   cycloSPORINE  1 drop Both Eyes BID   enoxaparin (LOVENOX) injection  40 mg Subcutaneous Q24H   gabapentin  300 mg Oral TID   levothyroxine  50 mcg Oral QAC breakfast   liothyronine  5 mcg Oral q AM   metoprolol tartrate  25 mg Oral BID   oxybutynin  10 mg Oral Daily   pantoprazole  40 mg Oral Q1200   tamsulosin  0.4 mg Oral BID   Continuous Infusions:  cefTRIAXone (ROCEPHIN)  IV      Principal Problem:   Complicated UTI (urinary tract infection) Active Problems:   AKI (acute kidney injury) (HCC)   LOS: 0 days

## 2023-08-05 DIAGNOSIS — N390 Urinary tract infection, site not specified: Secondary | ICD-10-CM | POA: Diagnosis not present

## 2023-08-05 LAB — BASIC METABOLIC PANEL
Anion gap: 10 (ref 5–15)
BUN: 18 mg/dL (ref 8–23)
CO2: 23 mmol/L (ref 22–32)
Calcium: 8.9 mg/dL (ref 8.9–10.3)
Chloride: 108 mmol/L (ref 98–111)
Creatinine, Ser: 1.23 mg/dL (ref 0.61–1.24)
GFR, Estimated: >60 mL/min (ref >60)
Glucose, Bld: 83 mg/dL (ref 70–99)
Potassium: 3.7 mmol/L (ref 3.5–5.1)
Sodium: 141 mmol/L (ref 135–145)

## 2023-08-05 LAB — CBC WITH DIFFERENTIAL/PLATELET
Abs Immature Granulocytes: 0.04 10*3/uL (ref 0.00–0.07)
Basophils Absolute: 0 10*3/uL (ref 0.0–0.1)
Basophils Relative: 0 %
Eosinophils Absolute: 0.1 10*3/uL (ref 0.0–0.5)
Eosinophils Relative: 1 %
HCT: 39.6 % (ref 39.0–52.0)
Hemoglobin: 12.7 g/dL — ABNORMAL LOW (ref 13.0–17.0)
Immature Granulocytes: 1 %
Lymphocytes Relative: 10 %
Lymphs Abs: 0.8 10*3/uL (ref 0.7–4.0)
MCH: 29.1 pg (ref 26.0–34.0)
MCHC: 32.1 g/dL (ref 30.0–36.0)
MCV: 90.8 fL (ref 80.0–100.0)
Monocytes Absolute: 0.8 10*3/uL (ref 0.1–1.0)
Monocytes Relative: 10 %
Neutro Abs: 6.3 10*3/uL (ref 1.7–7.7)
Neutrophils Relative %: 78 %
Platelets: 114 10*3/uL — ABNORMAL LOW (ref 150–400)
RBC: 4.36 MIL/uL (ref 4.22–5.81)
RDW: 14.9 % (ref 11.5–15.5)
WBC: 8.1 10*3/uL (ref 4.0–10.5)
nRBC: 0 % (ref 0.0–0.2)

## 2023-08-05 LAB — URINE CULTURE: Culture: 50000 — AB

## 2023-08-05 MED ORDER — SODIUM CHLORIDE 0.9 % IV SOLN
2.0000 g | Freq: Two times a day (BID) | INTRAVENOUS | Status: DC
  Administered 2023-08-05 – 2023-08-08 (×7): 2 g via INTRAVENOUS
  Filled 2023-08-05 (×7): qty 12.5

## 2023-08-05 MED ORDER — CHLORHEXIDINE GLUCONATE CLOTH 2 % EX PADS
6.0000 | MEDICATED_PAD | Freq: Every day | CUTANEOUS | Status: DC
  Administered 2023-08-05 – 2023-08-08 (×4): 6 via TOPICAL

## 2023-08-05 NOTE — Progress Notes (Signed)
PROGRESS NOTE  Ryan [name]:[id number] DOB: [birth year] DOA: 08/03/2023 PCP: Kristian Covey, MD  Brief History   The patient is a 78 yr old man who presented to [facility] on 08/03/2023 with complaints of generalized weaknes, chills, and suprapubic pain. The patient had undergone cystoscopy on 08/01/2023 that demonstrated ulcerations of the bladder. He was discharge to home with pyridium and oxycodone. The patient became very lethargic after taking the oxycodone. The patient began having fever, chills, and myalgias on the night of 08/02/2023.   The patient carries a medical history significant for prostate cancer s/p cystoscopy with suprapubic catheter placement on 08/01/2023, GERD, HLD, HTN, Hypothyroidism, SVT, Tachycardia, and lumbar radiculopathy.  He was admitted to [facility] by Dr. Kirke Corin. UA is positive for UTI The patient is receiving IV rocephin.   The patient's urine culture has grown out pseudomonas. For this reason his antibiotics have been changed to cefepime. He continues to complain of pain from bladder spasm. Oxybutinin is available.  A & P  Complicated UTI Chronic suprapubic pain Patient with history of prostate cancer status post recent cystoscopy now presenting with suprapubic pain and systemic signs of infection.  UA shows evidence of infection.  Spiked a fever of 102.7 while in the ED. -Continue IV Cefepime -Continue suprapubic catheter -Follow-up blood culture, urine culture -Continue oxybutynin -Resume home PRN Pyridium and oxycodone -Trend CBC, fever curve   AKI Slight bump in creatinine to 1.67 from 1.32 19-month ago. This is in the setting of dehydration from his urinary infection. Has received 3 L of IV fluids in the ED.  BP improved with SBP in the 100s to 120s. -Creatinine is improved from 1.45to 1.23.  -Continue following creatinine, electrolytes, and volume status -Avoid nephrotoxic's agents   # Prostate cancer Had a recent cystoscopy status post placement of  a suprapubic catheter. -Continue tamsulosin -Follow-up with urology in the outpatient   # Hypothyroidism -Continue Synthroid and Cytomel -Follow-up repeat TSH   # GERD -Continue Protonix   # Hx of SVT # Tachycardia EKG today shows sinus tachycardia with a rate of 109. -Telemetry -Continue Toprol-XL   # HLD -Continue atorvastatin   # Lumbar radiculopathy -Continue gabapentin    DVT prophylaxis: Lovenox      Code Status: Limited: Do not attempt resuscitation (DNR) -DNR-LIMITED -Do Not Intubate/DNI    Consults called: None   Family Communication: Discussed admission with spouse on the phone  I have seen and examined this patient myself. I have spent 32 minutes in his evaluation and care.  DVT prophylaxis: Lovenox Code Status: DNR-limited Family Communication: None available Disposition Plan: tbd    Cordera Stineman, DO [facility] Direct contact: see [email or web address]  7PM-7AM contact night coverage as above 08/05/2023, 5:11 PM  LOS: 1 day   Consultants  None  Procedures  None  Antibiotics   Anti-infectives (From admission, onward)    Start     Dose/Rate Route Frequency Ordered Stop   08/05/23 1215  ceFEPIme (MAXIPIME) 2 g in sodium chloride 0.9 % 100 mL IVPB        2 g 200 mL/hr over 30 Minutes Intravenous Every 12 hours 08/05/23 1123     08/04/23 1400  cefTRIAXone (ROCEPHIN) 1 g in sodium chloride 0.9 % 100 mL IVPB  Status:  Discontinued        1 g 200 mL/hr over 30 Minutes Intravenous Every 24 hours 08/04/23 1353 08/05/23 1123   08/03/23 2145  Urelle (URELLE/URISED) 81 MG tablet 81 mg  1 tablet Oral 4 times daily PRN 08/03/23 2145     08/03/23 1929  Uro-MP 118 MG CAPS 118 mg  Status:  Discontinued        1 capsule Oral 4 times daily PRN 08/03/23 1931 08/03/23 2145   08/03/23 1345  cefTRIAXone (ROCEPHIN) 1 g in sodium chloride 0.9 % 100 mL IVPB        1 g 200 mL/hr over 30 Minutes Intravenous  Once 08/03/23 1339 08/03/23 1526        Interval  History/Subjective  The patient is lying quietly on the gurney in the ED. He is waiting for a room to become available upstairs.  Objective   Vitals:  Vitals:   08/05/23 0811 08/05/23 1229  BP: (!) 158/72 (!) 114/56  Pulse: (!) 106 99  Resp: 18 16  Temp: 100.2 F (37.9 C) 98.8 F (37.1 C)  SpO2: 100% 94%    Exam:  Constitutional:  The patient is awake, alert, and oriented x 3. No acute distress. Respiratory:  No increased work of breathing. No wheezes, rales, or rhonchi No tactile fremitus Cardiovascular:  Regular rate and rhythm No murmurs, ectopy, or gallups. No lateral PMI. No thrills. Abdomen:  Abdomen is soft, non-tender, non-distended No hernias, masses, or organomegaly Normoactive bowel sounds.  Musculoskeletal:  No cyanosis, clubbing, or edema Skin:  No rashes, lesions, ulcers palpation of skin: no induration or nodules Neurologic:  CN 2-12 intact Sensation all 4 extremities intact Psychiatric:  Mental status Mood, affect appropriate Orientation to person, place, time  judgment and insight appear intact   I have personally reviewed the following:   Today's Data   Vitals:   08/05/23 0811 08/05/23 1229  BP: (!) 158/72 (!) 114/56  Pulse: (!) 106 99  Resp: 18 16  Temp: 100.2 F (37.9 C) 98.8 F (37.1 C)  SpO2: 100% 94%     Lab Data  CBC    Component Value Date/Time   WBC 8.1 08/05/2023 0917   RBC 4.36 08/05/2023 0917   HGB 12.7 (L) 08/05/2023 0917   HCT 39.6 08/05/2023 0917   PLT 114 (L) 08/05/2023 0917   MCV 90.8 08/05/2023 0917   MCH 29.1 08/05/2023 0917   MCHC 32.1 08/05/2023 0917   RDW 14.9 08/05/2023 0917   LYMPHSABS 0.8 08/05/2023 0917   MONOABS 0.8 08/05/2023 0917   EOSABS 0.1 08/05/2023 0917   BASOSABS 0.0 08/05/2023 0917      Latest Ref Rng & Units 08/04/2023    4:21 AM 08/03/2023    2:59 PM 08/03/2023   12:38 PM  BMP  Glucose 70 - 99 mg/dL 161  99  95   BUN 8 - 23 mg/dL 20  23  22    Creatinine 0.61 - 1.24 mg/dL 0.96   0.45  4.09   Sodium 135 - 145 mmol/L 135  137  138   Potassium 3.5 - 5.1 mmol/L 3.5  3.8  3.8   Chloride 98 - 111 mmol/L 106  103  101   CO2 22 - 32 mmol/L 22     Calcium 8.9 - 10.3 mg/dL 8.0        Micro Data   Results for orders placed or performed during the hospital encounter of 08/03/23  Blood Culture (routine x 2)     Status: None (Preliminary result)   Collection Time: 08/03/23 12:27 PM   Specimen: BLOOD RIGHT ARM  Result Value Ref Range Status   Specimen Description   Final    BLOOD  RIGHT ARM Performed at [facility], [address], [place], [place] [address]    Special Requests   Final    BOTTLES DRAWN AEROBIC AND ANAEROBIC Blood Culture results may not be optimal due to an inadequate volume of blood received in culture bottles Performed at [facility], [address] [place], [place] [address]    Culture   Final    NO GROWTH 2 DAYS Performed at [facility], [address], [place], [place] [address]    Report Status PENDING  Incomplete  Blood Culture (routine x 2)     Status: None (Preliminary result)   Collection Time: 08/03/23  2:30 PM   Specimen: BLOOD  Result Value Ref Range Status   Specimen Description   Final    BLOOD SITE NOT SPECIFIED Performed at [facility], [address], [place], [place] [address]    Special Requests   Final    BOTTLES DRAWN AEROBIC AND ANAEROBIC Blood Culture adequate volume Performed at [facility], [address], [place], [place] [address]    Culture   Final    NO GROWTH 2 DAYS Performed at [facility], [address] [place], [place] [address]    Report Status PENDING  Incomplete  Urine Culture     Status: Abnormal   Collection Time: 08/03/23  3:16 PM   Specimen: Urine, Suprapubic  Result Value Ref Range Status   Specimen Description   Final    URINE, SUPRAPUBIC Performed at [facility], [address],  [place], [place] [address]    Special Requests   Final    NONE Performed at [facility], [address], [place], [place] [address]    Culture 50,000 COLONIES/mL PSEUDOMONAS AERUGINOSA (A)  Final   Report Status 08/05/2023 FINAL  Final   Organism ID, Bacteria PSEUDOMONAS AERUGINOSA (A)  Final      Susceptibility   Pseudomonas aeruginosa - MIC*    CEFTAZIDIME <=1 SENSITIVE Sensitive     CIPROFLOXACIN <=0.25 SENSITIVE Sensitive     GENTAMICIN <=1 SENSITIVE Sensitive     IMIPENEM 1 SENSITIVE Sensitive     PIP/TAZO <=4 SENSITIVE Sensitive ug/mL    CEFEPIME 2 SENSITIVE Sensitive     * 50,000 COLONIES/mL PSEUDOMONAS AERUGINOSA  Resp panel by RT-PCR (RSV, Flu A&B, Covid) Peripheral     Status: None   Collection Time: 08/03/23  3:34 PM   Specimen: Peripheral; Nasal Swab  Result Value Ref Range Status   SARS Coronavirus 2 by RT PCR NEGATIVE NEGATIVE Final    Comment: (NOTE) SARS-CoV-2 target nucleic acids are NOT DETECTED.  The SARS-CoV-2 RNA is generally detectable in upper respiratory specimens during the acute phase of infection. The lowest concentration of SARS-CoV-2 viral copies this assay can detect is 138 copies/mL. A negative result does not preclude SARS-Cov-2 infection and should not be used as the sole basis for treatment or other patient management decisions. A negative result may occur with  improper specimen collection/handling, submission of specimen other than nasopharyngeal swab, presence of viral mutation(s) within the areas targeted by this assay, and inadequate number of viral copies(<138 copies/mL). A negative result must be combined with clinical observations, patient history, and epidemiological information. The expected result is Negative.  Fact Sheet for Patients:  BloggerCourse.com  Fact Sheet for Healthcare Providers:  SeriousBroker.it  This test is no t yet approved or cleared by the [place] and  has been authorized for detection and/or diagnosis of SARS-CoV-2 by FDA under an Emergency Use Authorization (EUA). This EUA will remain  in effect (meaning this test can be used) for the duration of the COVID-19 declaration under Section 564(b)(1) of the Act, 21 U.S.C.section 360bbb-3(b)(1), unless the authorization is terminated  or revoked sooner.       Influenza A by PCR NEGATIVE NEGATIVE Final   Influenza B by PCR NEGATIVE NEGATIVE Final    Comment: (NOTE) The Xpert Xpress SARS-CoV-2/FLU/RSV plus assay is intended as an aid in the diagnosis of influenza from Nasopharyngeal swab specimens and should not be used as a sole basis for treatment. Nasal washings and aspirates are unacceptable for Xpert Xpress SARS-CoV-2/FLU/RSV testing.  Fact Sheet for Patients: BloggerCourse.com  Fact Sheet for Healthcare Providers: SeriousBroker.it  This test is not yet approved or cleared by the [place] FDA and has been authorized for detection and/or diagnosis of SARS-CoV-2 by FDA under an Emergency Use Authorization (EUA). This EUA will remain in effect (meaning this test can be used) for the duration of the COVID-19 declaration under Section 564(b)(1) of the Act, 21 U.S.C. section 360bbb-3(b)(1), unless the authorization is terminated or revoked.     Resp Syncytial Virus by PCR NEGATIVE NEGATIVE Final    Comment: (NOTE) Fact Sheet for Patients: BloggerCourse.com  Fact Sheet for Healthcare Providers: SeriousBroker.it  This test is not yet approved or cleared by the [place] FDA and has been authorized for detection and/or diagnosis of SARS-CoV-2 by FDA under an Emergency Use Authorization (EUA). This EUA will remain in effect (meaning this test can be used) for the duration of the COVID-19 declaration under Section 564(b)(1) of the Act, 21 U.S.C. section  360bbb-3(b)(1), unless the authorization is terminated or revoked.  Performed at [facility], [address], [place], [place] [address]      Scheduled Meds:  atorvastatin  40 mg Oral QHS   Chlorhexidine Gluconate Cloth  6 each Topical Daily   cycloSPORINE  1 drop Both Eyes BID   enoxaparin (LOVENOX) injection  40 mg Subcutaneous Q24H   feeding supplement  237 mL Oral BID BM   gabapentin  300 mg Oral TID   levothyroxine  50 mcg Oral QAC breakfast   liothyronine  5 mcg Oral q AM   metoprolol tartrate  25 mg Oral BID   oxybutynin  10 mg Oral Daily   pantoprazole  40 mg Oral Q1200   tamsulosin  0.4 mg Oral BID   Continuous Infusions:  ceFEPime (MAXIPIME) IV 2 g (08/05/23 1234)    Principal Problem:   Complicated UTI (urinary tract infection) Active Problems:   AKI (acute kidney injury) (HCC)   LOS: 1 day

## 2023-08-06 DIAGNOSIS — N390 Urinary tract infection, site not specified: Secondary | ICD-10-CM | POA: Diagnosis not present

## 2023-08-06 LAB — CBC WITH DIFFERENTIAL/PLATELET
Abs Immature Granulocytes: 0.02 10*3/uL (ref 0.00–0.07)
Basophils Absolute: 0 10*3/uL (ref 0.0–0.1)
Basophils Relative: 0 %
Eosinophils Absolute: 0.1 10*3/uL (ref 0.0–0.5)
Eosinophils Relative: 2 %
HCT: 34.2 % — ABNORMAL LOW (ref 39.0–52.0)
Hemoglobin: 11.1 g/dL — ABNORMAL LOW (ref 13.0–17.0)
Immature Granulocytes: 0 %
Lymphocytes Relative: 12 %
Lymphs Abs: 0.8 10*3/uL (ref 0.7–4.0)
MCH: 29.1 pg (ref 26.0–34.0)
MCHC: 32.5 g/dL (ref 30.0–36.0)
MCV: 89.5 fL (ref 80.0–100.0)
Monocytes Absolute: 0.5 10*3/uL (ref 0.1–1.0)
Monocytes Relative: 8 %
Neutro Abs: 5.1 10*3/uL (ref 1.7–7.7)
Neutrophils Relative %: 78 %
Platelets: 124 10*3/uL — ABNORMAL LOW (ref 150–400)
RBC: 3.82 MIL/uL — ABNORMAL LOW (ref 4.22–5.81)
RDW: 14.8 % (ref 11.5–15.5)
WBC: 6.6 10*3/uL (ref 4.0–10.5)
nRBC: 0 % (ref 0.0–0.2)

## 2023-08-06 LAB — BASIC METABOLIC PANEL
Anion gap: 9 (ref 5–15)
BUN: 18 mg/dL (ref 8–23)
CO2: 21 mmol/L — ABNORMAL LOW (ref 22–32)
Calcium: 8.4 mg/dL — ABNORMAL LOW (ref 8.9–10.3)
Chloride: 106 mmol/L (ref 98–111)
Creatinine, Ser: 1.09 mg/dL (ref 0.61–1.24)
GFR, Estimated: >60 mL/min (ref >60)
Glucose, Bld: 120 mg/dL — ABNORMAL HIGH (ref 70–99)
Potassium: 3.1 mmol/L — ABNORMAL LOW (ref 3.5–5.1)
Sodium: 136 mmol/L (ref 135–145)

## 2023-08-06 MED ORDER — POTASSIUM CHLORIDE 10 MEQ/100ML IV SOLN
10.0000 meq | INTRAVENOUS | Status: AC
Start: 2023-08-06 — End: 2023-08-06
  Administered 2023-08-06 (×4): 10 meq via INTRAVENOUS
  Filled 2023-08-06 (×4): qty 100

## 2023-08-06 MED ORDER — OXYBUTYNIN CHLORIDE ER 5 MG PO TB24
15.0000 mg | ORAL_TABLET | Freq: Every day | ORAL | Status: DC
  Administered 2023-08-07 – 2023-08-08 (×2): 15 mg via ORAL
  Filled 2023-08-06: qty 3
  Filled 2023-08-06: qty 1
  Filled 2023-08-06: qty 3

## 2023-08-06 MED ORDER — OXYBUTYNIN CHLORIDE 5 MG PO TABS
5.0000 mg | ORAL_TABLET | Freq: Two times a day (BID) | ORAL | Status: AC
  Administered 2023-08-06 (×2): 5 mg via ORAL
  Filled 2023-08-06 (×2): qty 1

## 2023-08-06 NOTE — Progress Notes (Signed)
PROGRESS NOTE  Ryan [name] ZOX:[id number] DOB: [birth year] DOA: 08/03/2023 PCP: Kristian Covey, MD  Brief History   The patient is a 78 yr old man who presented to [facility] on 08/03/2023 with complaints of generalized weaknes, chills, and suprapubic pain. The patient had undergone cystoscopy on 08/01/2023 that demonstrated ulcerations of the bladder. He was discharge to home with pyridium and oxycodone. The patient became very lethargic after taking the oxycodone. The patient began having fever, chills, and myalgias on the night of 08/02/2023.   The patient carries a medical history significant for prostate cancer s/p cystoscopy with suprapubic catheter placement on 08/01/2023, GERD, HLD, HTN, Hypothyroidism, SVT, Tachycardia, and lumbar radiculopathy.  He was admitted to [facility] by Dr. Kirke Corin. UA is positive for UTI The patient is receiving IV rocephin.   The patient's urine culture has grown out pseudomonas. For this reason his antibiotics have been changed to cefepime. He continues to complain of pain from bladder spasm. Oxybutinin is available.  On the morning of 08/06/2023 the patient was hypokalemic with a potassium of 3.1. His WBC has normalized at 6.6.  He continues to complain of pain from bladder spasms. His oxybutinin has been increased from 10 mg to 15. As thisis for daily administration, he will receive extra doses of 5 mg tablets today.  A & P  Complicated UTI Chronic suprapubic pain Patient with history of prostate cancer status post recent cystoscopy now presenting with suprapubic pain and systemic signs of infection.  UA shows evidence of infection.  Spiked a fever of 102.7 while in the ED. -Continue IV Cefepime -Continue suprapubic catheter -Blood cultures have had no growth -Urine culture has grown out pseudomonas that is pan sensitive. -Increase oxybutynin -Resume home PRN Pyridium and oxycodone -WBC has normalized, but patient had a temp of 100.1 early this afternoon.    AKI Slight bump in creatinine to 1.67 from 1.32 25-month ago. This is in the setting of dehydration from his urinary infection. Has received 3 L of IV fluids in the ED.  BP improved with SBP in the 100s to 120s. -Creatinine is improved from 1.70 to 1.09. This appears to be his baseline. -Continue following creatinine, electrolytes, and volume status -Avoid nephrotoxic's agents   # Prostate cancer Had a recent cystoscopy status post placement of a suprapubic catheter. -Continue tamsulosin -Follow-up with urology in the outpatient   # Hypothyroidism -Continue Synthroid and Cytomel -Follow-up repeat TSH   # GERD -Continue Protonix   # Hx of SVT # Tachycardia EKG today shows sinus tachycardia with a rate of 109. -Telemetry -Continue Toprol-XL   # HLD -Continue atorvastatin   # Lumbar radiculopathy -Continue gabapentin    DVT prophylaxis: Lovenox      Code Status: Limited: Do not attempt resuscitation (DNR) -DNR-LIMITED -Do Not Intubate/DNI    Consults called: None   Family Communication: Discussed admission with spouse on the phone  I have seen and examined this patient myself. I have spent 32 minutes in his evaluation and care.  DVT prophylaxis: Lovenox Code Status: DNR-limited Family Communication: None available Disposition Plan: tbd    Akil Hoos, DO [facility] Direct contact: see [email or web address]  7PM-7AM contact night coverage as above 08/06/2023, 6:05 PM  LOS: 2 days   Consultants  None  Procedures  None  Antibiotics   Anti-infectives (From admission, onward)    Start     Dose/Rate Route Frequency Ordered Stop   08/05/23 1215  ceFEPIme (MAXIPIME) 2 g in sodium chloride 0.9 %  100 mL IVPB        2 g 200 mL/hr over 30 Minutes Intravenous Every 12 hours 08/05/23 1123     08/04/23 1400  cefTRIAXone (ROCEPHIN) 1 g in sodium chloride 0.9 % 100 mL IVPB  Status:  Discontinued        1 g 200 mL/hr over 30 Minutes Intravenous Every 24 hours 08/04/23  1353 08/05/23 1123   08/03/23 2145  Urelle (URELLE/URISED) 81 MG tablet 81 mg        1 tablet Oral 4 times daily PRN 08/03/23 2145     08/03/23 1929  Uro-MP 118 MG CAPS 118 mg  Status:  Discontinued        1 capsule Oral 4 times daily PRN 08/03/23 1931 08/03/23 2145   08/03/23 1345  cefTRIAXone (ROCEPHIN) 1 g in sodium chloride 0.9 % 100 mL IVPB        1 g 200 mL/hr over 30 Minutes Intravenous  Once 08/03/23 1339 08/03/23 1526        Interval History/Subjective  The patient is resting quietly. His wife is at bedside.  Objective   Vitals:  Vitals:   08/06/23 0452 08/06/23 1236  BP: 124/70 122/65  Pulse: 88 91  Resp: 18 16  Temp: 99 F (37.2 C) 100.1 F (37.8 C)  SpO2: 94% 98%    Exam:  Constitutional:  The patient is awake, alert, and oriented x 3. He is complaining of pain from bladder spasms. Respiratory:  No increased work of breathing. No wheezes, rales, or rhonchi No tactile fremitus Cardiovascular:  Regular rate and rhythm No murmurs, ectopy, or gallups. No lateral PMI. No thrills. Abdomen:  Abdomen is soft, non-tender, non-distended No hernias, masses, or organomegaly Normoactive bowel sounds.  Musculoskeletal:  No cyanosis, clubbing, or edema Skin:  No rashes, lesions, ulcers palpation of skin: no induration or nodules Neurologic:  CN 2-12 intact Sensation all 4 extremities intact Psychiatric:  Mental status Mood, affect appropriate Orientation to person, place, time  judgment and insight appear intact   I have personally reviewed the following:   Today's Data   Vitals:   08/06/23 0452 08/06/23 1236  BP: 124/70 122/65  Pulse: 88 91  Resp: 18 16  Temp: 99 F (37.2 C) 100.1 F (37.8 C)  SpO2: 94% 98%     Lab Data  CBC    Component Value Date/Time   WBC 6.6 08/06/2023 0634   RBC 3.82 (L) 08/06/2023 0634   HGB 11.1 (L) 08/06/2023 0634   HCT 34.2 (L) 08/06/2023 0634   PLT 124 (L) 08/06/2023 0634   MCV 89.5 08/06/2023 0634   MCH  29.1 08/06/2023 0634   MCHC 32.5 08/06/2023 0634   RDW 14.8 08/06/2023 0634   LYMPHSABS 0.8 08/06/2023 0634   MONOABS 0.5 08/06/2023 0634   EOSABS 0.1 08/06/2023 0634   BASOSABS 0.0 08/06/2023 0634      Latest Ref Rng & Units 08/06/2023    6:34 AM 08/05/2023    9:17 AM 08/04/2023    4:21 AM  BMP  Glucose 70 - 99 mg/dL 409  83  811   BUN 8 - 23 mg/dL 18  18  20    Creatinine 0.61 - 1.24 mg/dL 9.14  7.82  9.56   Sodium 135 - 145 mmol/L 136  141  135   Potassium 3.5 - 5.1 mmol/L 3.1  3.7  3.5   Chloride 98 - 111 mmol/L 106  108  106   CO2 22 - 32 mmol/L  21  23  22    Calcium 8.9 - 10.3 mg/dL 8.4  8.9  8.0      Micro Data   Results for orders placed or performed during the hospital encounter of 08/03/23  Blood Culture (routine x 2)     Status: None (Preliminary result)   Collection Time: 08/03/23 12:27 PM   Specimen: BLOOD RIGHT ARM  Result Value Ref Range Status   Specimen Description   Final    BLOOD RIGHT ARM Performed at [facility], [address] [place], [place] [address]    Special Requests   Final    BOTTLES DRAWN AEROBIC AND ANAEROBIC Blood Culture results may not be optimal due to an inadequate volume of blood received in culture bottles Performed at [facility], [address], [place], [place] [address]    Culture   Final    NO GROWTH 3 DAYS Performed at [facility], [address] [place], [place] [address]    Report Status PENDING  Incomplete  Blood Culture (routine x 2)     Status: None (Preliminary result)   Collection Time: 08/03/23  2:30 PM   Specimen: BLOOD  Result Value Ref Range Status   Specimen Description   Final    BLOOD SITE NOT SPECIFIED Performed at [facility], [address], [place], [place] [address]    Special Requests   Final    BOTTLES DRAWN AEROBIC AND ANAEROBIC Blood Culture adequate volume Performed at [facility], [address] [place], [place] [address]     Culture   Final    NO GROWTH 3 DAYS Performed at [facility], [address], [place], [place] [address]    Report Status PENDING  Incomplete  Urine Culture     Status: Abnormal   Collection Time: 08/03/23  3:16 PM   Specimen: Urine, Suprapubic  Result Value Ref Range Status   Specimen Description   Final    URINE, SUPRAPUBIC Performed at [facility], [address], [place], [place] [address]    Special Requests   Final    NONE Performed at [facility], [address] [place], [place] [address]    Culture 50,000 COLONIES/mL PSEUDOMONAS AERUGINOSA (A)  Final   Report Status 08/05/2023 FINAL  Final   Organism ID, Bacteria PSEUDOMONAS AERUGINOSA (A)  Final      Susceptibility   Pseudomonas aeruginosa - MIC*    CEFTAZIDIME <=1 SENSITIVE Sensitive     CIPROFLOXACIN <=0.25 SENSITIVE Sensitive     GENTAMICIN <=1 SENSITIVE Sensitive     IMIPENEM 1 SENSITIVE Sensitive     PIP/TAZO <=4 SENSITIVE Sensitive ug/mL    CEFEPIME 2 SENSITIVE Sensitive     * 50,000 COLONIES/mL PSEUDOMONAS AERUGINOSA  Resp panel by RT-PCR (RSV, Flu A&B, Covid) Peripheral     Status: None   Collection Time: 08/03/23  3:34 PM   Specimen: Peripheral; Nasal Swab  Result Value Ref Range Status   SARS Coronavirus 2 by RT PCR NEGATIVE NEGATIVE Final    Comment: (NOTE) SARS-CoV-2 target nucleic acids are NOT DETECTED.  The SARS-CoV-2 RNA is generally detectable in upper respiratory specimens during the acute phase of infection. The lowest concentration of SARS-CoV-2 viral copies this assay can detect is 138 copies/mL. A negative result does not preclude SARS-Cov-2 infection and should not be used as the sole basis for treatment or other patient management decisions. A negative result may  occur with  improper specimen collection/handling, submission of specimen other than nasopharyngeal swab, presence of viral mutation(s) within the areas targeted by this assay,  and inadequate number of viral copies(<138 copies/mL). A negative result must be combined with clinical observations, patient history, and epidemiological information. The expected result is Negative.  Fact Sheet for Patients:  BloggerCourse.com  Fact Sheet for Healthcare Providers:  SeriousBroker.it  This test is no t yet approved or cleared by the [place] FDA and  has been authorized for detection and/or diagnosis of SARS-CoV-2 by FDA under an Emergency Use Authorization (EUA). This EUA will remain  in effect (meaning this test can be used) for the duration of the COVID-19 declaration under Section 564(b)(1) of the Act, 21 U.S.C.section 360bbb-3(b)(1), unless the authorization is terminated  or revoked sooner.       Influenza A by PCR NEGATIVE NEGATIVE Final   Influenza B by PCR NEGATIVE NEGATIVE Final    Comment: (NOTE) The Xpert Xpress SARS-CoV-2/FLU/RSV plus assay is intended as an aid in the diagnosis of influenza from Nasopharyngeal swab specimens and should not be used as a sole basis for treatment. Nasal washings and aspirates are unacceptable for Xpert Xpress SARS-CoV-2/FLU/RSV testing.  Fact Sheet for Patients: BloggerCourse.com  Fact Sheet for Healthcare Providers: SeriousBroker.it  This test is not yet approved or cleared by the [place] FDA and has been authorized for detection and/or diagnosis of SARS-CoV-2 by FDA under an Emergency Use Authorization (EUA). This EUA will remain in effect (meaning this test can be used) for the duration of the COVID-19 declaration under Section 564(b)(1) of the Act, 21 U.S.C. section 360bbb-3(b)(1), unless the authorization is terminated or revoked.     Resp Syncytial Virus by PCR NEGATIVE NEGATIVE Final    Comment: (NOTE) Fact Sheet for Patients: BloggerCourse.com  Fact Sheet for  Healthcare Providers: SeriousBroker.it  This test is not yet approved or cleared by the [place] FDA and has been authorized for detection and/or diagnosis of SARS-CoV-2 by FDA under an Emergency Use Authorization (EUA). This EUA will remain in effect (meaning this test can be used) for the duration of the COVID-19 declaration under Section 564(b)(1) of the Act, 21 U.S.C. section 360bbb-3(b)(1), unless the authorization is terminated or revoked.  Performed at [facility], [address], [place], [place] [address]      Scheduled Meds:  atorvastatin  40 mg Oral QHS   Chlorhexidine Gluconate Cloth  6 each Topical Daily   cycloSPORINE  1 drop Both Eyes BID   enoxaparin (LOVENOX) injection  40 mg Subcutaneous Q24H   feeding supplement  237 mL Oral BID BM   gabapentin  300 mg Oral TID   levothyroxine  50 mcg Oral QAC breakfast   liothyronine  5 mcg Oral q AM   metoprolol tartrate  25 mg Oral BID   [START ON 08/07/2023] oxybutynin  15 mg Oral Daily   oxybutynin  5 mg Oral BID   pantoprazole  40 mg Oral Q1200   tamsulosin  0.4 mg Oral BID   Continuous Infusions:  ceFEPime (MAXIPIME) IV 2 g (08/06/23 1002)    Principal Problem:   Complicated UTI (urinary tract infection) Active Problems:   AKI (acute kidney injury) (HCC)   LOS: 2 days

## 2023-08-06 NOTE — Plan of Care (Signed)

## 2023-08-06 NOTE — Plan of Care (Signed)
  Problem: Education: Goal: Knowledge of General Education information will improve Description: Including pain rating scale, medication(s)/side effects and non-pharmacologic comfort measures Outcome: Progressing   Problem: Health Behavior/Discharge Planning: Goal: Ability to manage health-related needs will improve Outcome: Progressing   Problem: Clinical Measurements: Goal: Ability to maintain clinical measurements within normal limits will improve Outcome: Progressing Goal: Will remain free from infection Outcome: Progressing Goal: Diagnostic test results will improve Outcome: Progressing   Problem: Activity: Goal: Risk for activity intolerance will decrease Outcome: Progressing   Problem: Nutrition: Goal: Adequate nutrition will be maintained Outcome: Progressing   Problem: Coping: Goal: Level of anxiety will decrease Outcome: Progressing   Problem: Elimination: Goal: Will not experience complications related to bowel motility Outcome: Progressing   Problem: Pain Managment: Goal: General experience of comfort will improve and/or be controlled Outcome: Progressing   Problem: Safety: Goal: Ability to remain free from injury will improve Outcome: Progressing   Problem: Skin Integrity: Goal: Risk for impaired skin integrity will decrease Outcome: Progressing

## 2023-08-07 DIAGNOSIS — N179 Acute kidney failure, unspecified: Secondary | ICD-10-CM | POA: Diagnosis not present

## 2023-08-07 LAB — CBC WITH DIFFERENTIAL/PLATELET
Abs Immature Granulocytes: 0.05 10*3/uL (ref 0.00–0.07)
Basophils Absolute: 0 10*3/uL (ref 0.0–0.1)
Basophils Relative: 1 %
Eosinophils Absolute: 0.2 10*3/uL (ref 0.0–0.5)
Eosinophils Relative: 3 %
HCT: 34.3 % — ABNORMAL LOW (ref 39.0–52.0)
Hemoglobin: 11.2 g/dL — ABNORMAL LOW (ref 13.0–17.0)
Immature Granulocytes: 1 %
Lymphocytes Relative: 15 %
Lymphs Abs: 0.8 10*3/uL (ref 0.7–4.0)
MCH: 28.9 pg (ref 26.0–34.0)
MCHC: 32.7 g/dL (ref 30.0–36.0)
MCV: 88.4 fL (ref 80.0–100.0)
Monocytes Absolute: 0.6 10*3/uL (ref 0.1–1.0)
Monocytes Relative: 11 %
Neutro Abs: 3.8 10*3/uL (ref 1.7–7.7)
Neutrophils Relative %: 69 %
Platelets: 132 10*3/uL — ABNORMAL LOW (ref 150–400)
RBC: 3.88 MIL/uL — ABNORMAL LOW (ref 4.22–5.81)
RDW: 14.6 % (ref 11.5–15.5)
WBC: 5.5 10*3/uL (ref 4.0–10.5)
nRBC: 0 % (ref 0.0–0.2)

## 2023-08-07 LAB — BASIC METABOLIC PANEL
Anion gap: 8 (ref 5–15)
BUN: 20 mg/dL (ref 8–23)
CO2: 25 mmol/L (ref 22–32)
Calcium: 8.5 mg/dL — ABNORMAL LOW (ref 8.9–10.3)
Chloride: 105 mmol/L (ref 98–111)
Creatinine, Ser: 1.15 mg/dL (ref 0.61–1.24)
GFR, Estimated: >60 mL/min (ref >60)
Glucose, Bld: 94 mg/dL (ref 70–99)
Potassium: 3.2 mmol/L — ABNORMAL LOW (ref 3.5–5.1)
Sodium: 138 mmol/L (ref 135–145)

## 2023-08-07 LAB — MAGNESIUM: Magnesium: 2.2 mg/dL (ref 1.7–2.4)

## 2023-08-07 MED ORDER — HYOSCYAMINE SULFATE 0.125 MG SL SUBL: 0.1250 mg | SUBLINGUAL_TABLET | Freq: Four times a day (QID) | SUBLINGUAL | Status: DC | PRN

## 2023-08-07 MED ORDER — OXYCODONE HCL 5 MG PO TABS
5.0000 mg | ORAL_TABLET | ORAL | Status: DC | PRN
  Administered 2023-08-07 – 2023-08-08 (×3): 5 mg via ORAL
  Filled 2023-08-07 (×3): qty 1

## 2023-08-07 MED ORDER — POTASSIUM CHLORIDE CRYS ER 20 MEQ PO TBCR
40.0000 meq | EXTENDED_RELEASE_TABLET | Freq: Once | ORAL | Status: AC
  Administered 2023-08-07: 40 meq via ORAL
  Filled 2023-08-07: qty 2

## 2023-08-07 NOTE — Plan of Care (Signed)

## 2023-08-07 NOTE — Progress Notes (Signed)
PROGRESS NOTE  Ryan [name]:[id number] DOB: [birth year] DOA: 08/03/2023 PCP: Kristian Covey, MD  Brief History   The patient is a 78 yr old man who presented to [facility] on 08/03/2023 with complaints of generalized weaknes, chills, and suprapubic pain. The patient had undergone cystoscopy on 08/01/2023 that demonstrated ulcerations of the bladder. He was discharge to home with pyridium and oxycodone. The patient became very lethargic after taking the oxycodone. The patient began having fever, chills, and myalgias on the night of 08/02/2023.   The patient carries a medical history significant for prostate cancer s/p cystoscopy with suprapubic catheter placement on 08/01/2023, GERD, HLD, HTN, Hypothyroidism, SVT, Tachycardia, and lumbar radiculopathy.  He was admitted to [facility] by Dr. Kirke Corin. UA is positive for UTI The patient is receiving IV rocephin.   The patient's urine culture has grown out pseudomonas. For this reason his antibiotics have been changed to cefepime. He continues to complain of pain from bladder spasm. Oxybutinin is available.  On the morning of 08/06/2023 the patient was hypokalemic with a potassium of 3.1. His WBC has normalized at 6.6.  He continues to complain of pain from bladder spasms. His oxybutinin has been increased from 10 mg to 15. As thisis for daily administration, he will receive extra doses of 5 mg tablets today.  2/2: Fever of 100.1 recorded over the past 24-hour, mild hypokalemia at 3.2 with normal magnesium, potassium was repleted.  Continue to have suprapubic pain which has been going on for a while.  History of postradiation bladder ulceration which need to be followed up as outpatient.  Continuing IV cefepime, if remained afebrile over the past 24-hour can be discharged on ciprofloxacin tomorrow.  A & P  Complicated UTI Chronic suprapubic pain Patient with history of prostate cancer status post recent cystoscopy now presenting with suprapubic pain and  systemic signs of infection.  UA shows evidence of infection.  Urine cultures with Pseudomonas aeruginosa so antibiotics were switched with cefepime -Continue IV Cefepime -Continue suprapubic catheter -Blood cultures have had no growth -Increase oxybutynin -Resume home PRN Pyridium and oxycodone -WBC has normalized, but patient had a temp of 100.1 over the past 24-hour  AKI -Creatinine is improved from 1.70 to 1.09. This appears to be his baseline. -Continue following creatinine, electrolytes, and volume status -Avoid nephrotoxic's agents   # Prostate cancer Had a recent cystoscopy status post placement of a suprapubic catheter. -Continue tamsulosin -Follow-up with urology in the outpatient   # Hypothyroidism -Continue Synthroid and Cytomel   # GERD -Continue Protonix   # Hx of SVT # Tachycardia -Continue Toprol-XL   # HLD -Continue atorvastatin   # Lumbar radiculopathy -Continue gabapentin    DVT prophylaxis: Lovenox      Code Status: Limited: Do not attempt resuscitation (DNR) -DNR-LIMITED -Do Not Intubate/DNI    Consults called: None   Family Communication: Discussed with son at bed side I have seen and examined this patient myself. I have spent 40 minutes in his evaluation and care.  DVT prophylaxis: Lovenox Code Status: DNR-limited  Disposition Plan: Likely home tomorrow if remained afebrile    Consultants  None  Procedures  None  Antibiotics   Anti-infectives (From admission, onward)    Start     Dose/Rate Route Frequency Ordered Stop   08/05/23 1215  ceFEPIme (MAXIPIME) 2 g in sodium chloride 0.9 % 100 mL IVPB        2 g 200 mL/hr over 30 Minutes Intravenous Every 12 hours 08/05/23 1123  08/04/23 1400  cefTRIAXone (ROCEPHIN) 1 g in sodium chloride 0.9 % 100 mL IVPB  Status:  Discontinued        1 g 200 mL/hr over 30 Minutes Intravenous Every 24 hours 08/04/23 1353 08/05/23 1123   08/03/23 2145  Urelle (URELLE/URISED) 81 MG tablet 81 mg         1 tablet Oral 4 times daily PRN 08/03/23 2145     08/03/23 1929  Uro-MP 118 MG CAPS 118 mg  Status:  Discontinued        1 capsule Oral 4 times daily PRN 08/03/23 1931 08/03/23 2145   08/03/23 1345  cefTRIAXone (ROCEPHIN) 1 g in sodium chloride 0.9 % 100 mL IVPB        1 g 200 mL/hr over 30 Minutes Intravenous  Once 08/03/23 1339 08/03/23 1526        Interval History/Subjective  Patient will continue to have suprapubic discomfort.  Objective   Vitals:  Vitals:   08/06/23 1906 08/07/23 0510  BP: 135/66 (!) 148/72  Pulse: 72 85  Resp: 16 18  Temp: 98.6 F (37 C) 98.3 F (36.8 C)  SpO2: 100% 95%    Exam:  General.  Frail elderly man, in no acute distress. Pulmonary.  Lungs clear bilaterally, normal respiratory effort. CV.  Regular rate and rhythm, no JVD, rub or murmur. Abdomen.  Soft, nontender, nondistended, BS positive. CNS.  Alert and oriented .  No focal neurologic deficit. Extremities.  No edema, no cyanosis, pulses intact and symmetrical. Psychiatry.  Judgment and insight appears normal.   I have personally reviewed the following:   Today's Data   Vitals:   08/06/23 1906 08/07/23 0510  BP: 135/66 (!) 148/72  Pulse: 72 85  Resp: 16 18  Temp: 98.6 F (37 C) 98.3 F (36.8 C)  SpO2: 100% 95%     Lab Data  CBC    Component Value Date/Time   WBC 5.5 08/07/2023 0552   RBC 3.88 (L) 08/07/2023 0552   HGB 11.2 (L) 08/07/2023 0552   HCT 34.3 (L) 08/07/2023 0552   PLT 132 (L) 08/07/2023 0552   MCV 88.4 08/07/2023 0552   MCH 28.9 08/07/2023 0552   MCHC 32.7 08/07/2023 0552   RDW 14.6 08/07/2023 0552   LYMPHSABS 0.8 08/07/2023 0552   MONOABS 0.6 08/07/2023 0552   EOSABS 0.2 08/07/2023 0552   BASOSABS 0.0 08/07/2023 0552      Latest Ref Rng & Units 08/07/2023    5:52 AM 08/06/2023    6:34 AM 08/05/2023    9:17 AM  BMP  Glucose 70 - 99 mg/dL 94  161  83   BUN 8 - 23 mg/dL 20  18  18    Creatinine 0.61 - 1.24 mg/dL 0.96  0.45  4.09   Sodium 135 - 145  mmol/L 138  136  141   Potassium 3.5 - 5.1 mmol/L 3.2  3.1  3.7   Chloride 98 - 111 mmol/L 105  106  108   CO2 22 - 32 mmol/L 25  21  23    Calcium 8.9 - 10.3 mg/dL 8.5  8.4  8.9      Micro Data   Results for orders placed or performed during the hospital encounter of 08/03/23  Blood Culture (routine x 2)     Status: None (Preliminary result)   Collection Time: 08/03/23 12:27 PM   Specimen: BLOOD RIGHT ARM  Result Value Ref Range Status   Specimen Description   Final  BLOOD RIGHT ARM Performed at [facility], [address] [place], [place] [address]    Special Requests   Final    BOTTLES DRAWN AEROBIC AND ANAEROBIC Blood Culture results may not be optimal due to an inadequate volume of blood received in culture bottles Performed at [facility], [address], [place], [place] [address]    Culture   Final    NO GROWTH 4 DAYS Performed at [facility], [address] [place], [place] [address]    Report Status PENDING  Incomplete  Blood Culture (routine x 2)     Status: None (Preliminary result)   Collection Time: 08/03/23  2:30 PM   Specimen: BLOOD  Result Value Ref Range Status   Specimen Description   Final    BLOOD SITE NOT SPECIFIED Performed at [facility], [address], [place], [place] [address]    Special Requests   Final    BOTTLES DRAWN AEROBIC AND ANAEROBIC Blood Culture adequate volume Performed at [facility], [address] [place], [place] [address]    Culture   Final    NO GROWTH 4 DAYS Performed at [facility], [address], [place], [place] [address]    Report Status PENDING  Incomplete  Urine Culture     Status: Abnormal   Collection Time: 08/03/23  3:16 PM   Specimen: Urine, Suprapubic  Result Value Ref Range Status   Specimen Description   Final    URINE, SUPRAPUBIC Performed at [facility], [address] [place], [place] [address]     Special Requests   Final    NONE Performed at [facility], [address], [place], [place] [address]    Culture 50,000 COLONIES/mL PSEUDOMONAS AERUGINOSA (A)  Final   Report Status 08/05/2023 FINAL  Final   Organism ID, Bacteria PSEUDOMONAS AERUGINOSA (A)  Final      Susceptibility   Pseudomonas aeruginosa - MIC*    CEFTAZIDIME <=1 SENSITIVE Sensitive     CIPROFLOXACIN <=0.25 SENSITIVE Sensitive     GENTAMICIN <=1 SENSITIVE Sensitive     IMIPENEM 1 SENSITIVE Sensitive     PIP/TAZO <=4 SENSITIVE Sensitive ug/mL    CEFEPIME 2 SENSITIVE Sensitive     * 50,000 COLONIES/mL PSEUDOMONAS AERUGINOSA  Resp panel by RT-PCR (RSV, Flu A&B, Covid) Peripheral     Status: None   Collection Time: 08/03/23  3:34 PM   Specimen: Peripheral; Nasal Swab  Result Value Ref Range Status   SARS Coronavirus 2 by RT PCR NEGATIVE NEGATIVE Final    Comment: (NOTE) SARS-CoV-2 target nucleic acids are NOT DETECTED.  The SARS-CoV-2 RNA is generally detectable in upper respiratory specimens during the acute phase of infection. The lowest concentration of SARS-CoV-2 viral copies this assay can detect is 138 copies/mL. A negative result does not preclude SARS-Cov-2 infection and should not be used as the sole basis for treatment or other patient management decisions. A negative result may occur with  improper specimen collection/handling, submission of specimen other than nasopharyngeal swab, presence of viral mutation(s) within the areas targeted by this assay, and inadequate number of viral copies(<138 copies/mL). A negative result must be combined with clinical observations, patient history, and epidemiological information. The expected result is Negative.  Fact Sheet for Patients:  BloggerCourse.com  Fact Sheet for Healthcare Providers:  SeriousBroker.it  This test is no t yet approved or cleared by the [place] and  has been  authorized for detection and/or diagnosis of SARS-CoV-2 by FDA under an Emergency Use Authorization (EUA). This EUA will remain  in effect (meaning this test can be used) for the duration of the COVID-19 declaration under Section 564(b)(1) of the Act, 21 U.S.C.section 360bbb-3(b)(1), unless the authorization is terminated  or revoked sooner.       Influenza A by PCR NEGATIVE NEGATIVE Final   Influenza B by PCR NEGATIVE NEGATIVE Final    Comment: (NOTE) The Xpert Xpress SARS-CoV-2/FLU/RSV plus assay is intended as an aid in the diagnosis of influenza from Nasopharyngeal swab specimens and should not be used as a sole basis for treatment. Nasal washings and aspirates are unacceptable for Xpert Xpress SARS-CoV-2/FLU/RSV testing.  Fact Sheet for Patients: BloggerCourse.com  Fact Sheet for Healthcare Providers: SeriousBroker.it  This test is not yet approved or cleared by the [place] FDA and has been authorized for detection and/or diagnosis of SARS-CoV-2 by FDA under an Emergency Use Authorization (EUA). This EUA will remain in effect (meaning this test can be used) for the duration of the COVID-19 declaration under Section 564(b)(1) of the Act, 21 U.S.C. section 360bbb-3(b)(1), unless the authorization is terminated or revoked.     Resp Syncytial Virus by PCR NEGATIVE NEGATIVE Final    Comment: (NOTE) Fact Sheet for Patients: BloggerCourse.com  Fact Sheet for Healthcare Providers: SeriousBroker.it  This test is not yet approved or cleared by the [place] FDA and has been authorized for detection and/or diagnosis of SARS-CoV-2 by FDA under an Emergency Use Authorization (EUA). This EUA will remain in effect (meaning this test can be used) for the duration of the COVID-19 declaration under Section 564(b)(1) of the Act, 21 U.S.C. section 360bbb-3(b)(1), unless the  authorization is terminated or revoked.  Performed at [facility], [address] [place], [place] [address]      Scheduled Meds:  atorvastatin  40 mg Oral QHS   Chlorhexidine Gluconate Cloth  6 each Topical Daily   cycloSPORINE  1 drop Both Eyes BID   enoxaparin (LOVENOX) injection  40 mg Subcutaneous Q24H   feeding supplement  237 mL Oral BID BM   gabapentin  300 mg Oral TID   levothyroxine  50 mcg Oral QAC breakfast   liothyronine  5 mcg Oral q AM   metoprolol tartrate  25 mg Oral BID   oxybutynin  15 mg Oral Daily   pantoprazole  40 mg Oral Q1200   tamsulosin  0.4 mg Oral BID   Continuous Infusions:  ceFEPime (MAXIPIME) IV 2 g (08/07/23 1035)    Arnetha Courser, MD [facility] Direct contact: see [email or web address]  7PM-7AM contact night coverage as above 08/07/2023, 11:44 AM  LOS: 3 days

## 2023-08-07 NOTE — Plan of Care (Signed)
  Problem: Nutrition: Goal: Adequate nutrition will be maintained Outcome: Progressing   Problem: Safety: Goal: Ability to remain free from injury will improve Outcome: Progressing   Problem: Skin Integrity: Goal: Risk for impaired skin integrity will decrease Outcome: Progressing   

## 2023-08-08 DIAGNOSIS — M549 Dorsalgia, unspecified: Secondary | ICD-10-CM

## 2023-08-08 DIAGNOSIS — N3001 Acute cystitis with hematuria: Secondary | ICD-10-CM | POA: Diagnosis not present

## 2023-08-08 DIAGNOSIS — R531 Weakness: Secondary | ICD-10-CM

## 2023-08-08 DIAGNOSIS — C61 Malignant neoplasm of prostate: Secondary | ICD-10-CM | POA: Diagnosis not present

## 2023-08-08 DIAGNOSIS — G8929 Other chronic pain: Secondary | ICD-10-CM

## 2023-08-08 LAB — CULTURE, BLOOD (ROUTINE X 2)
Culture: NO GROWTH
Culture: NO GROWTH
Special Requests: ADEQUATE

## 2023-08-08 LAB — BASIC METABOLIC PANEL
Anion gap: 8 (ref 5–15)
BUN: 20 mg/dL (ref 8–23)
CO2: 24 mmol/L (ref 22–32)
Calcium: 8.8 mg/dL — ABNORMAL LOW (ref 8.9–10.3)
Chloride: 106 mmol/L (ref 98–111)
Creatinine, Ser: 1 mg/dL (ref 0.61–1.24)
GFR, Estimated: >60 mL/min (ref >60)
Glucose, Bld: 108 mg/dL — ABNORMAL HIGH (ref 70–99)
Potassium: 3.7 mmol/L (ref 3.5–5.1)
Sodium: 138 mmol/L (ref 135–145)

## 2023-08-08 LAB — MAGNESIUM: Magnesium: 2.1 mg/dL (ref 1.7–2.4)

## 2023-08-08 MED ORDER — HYOSCYAMINE SULFATE 0.125 MG SL SUBL: 0.1250 mg | SUBLINGUAL_TABLET | Freq: Four times a day (QID) | SUBLINGUAL | 0 refills | Status: DC | PRN

## 2023-08-08 MED ORDER — ENSURE ENLIVE PO LIQD: 237.0000 mL | Freq: Two times a day (BID) | ORAL | 12 refills | Status: DC

## 2023-08-08 MED ORDER — DIPHENHYDRAMINE HCL 25 MG PO CAPS
25.0000 mg | ORAL_CAPSULE | Freq: Four times a day (QID) | ORAL | Status: DC | PRN
  Administered 2023-08-08: 25 mg via ORAL
  Filled 2023-08-08: qty 1

## 2023-08-08 MED ORDER — CIPROFLOXACIN HCL 500 MG PO TABS: 500.0000 mg | ORAL_TABLET | Freq: Two times a day (BID) | ORAL | 0 refills | Status: AC

## 2023-08-08 NOTE — Plan of Care (Signed)

## 2023-08-08 NOTE — TOC CM/SW Note (Signed)
 [facility] Quality rating ???? Patient survey rating ????   [facility] Quality rating ????? Patient survey rating???   [facility] [phone] Quality rating???? Patient survey rating????   [facility] [phone] Quality rating???? Patient survey rating????   [facility] [phone] Quality rating ???? Patient survey rating???   [facility] Quality rating??? Patient survey rating???   [facility] [phone] Quality rating??? Patient survey rating ????   [facility] [phone] Quality rating ????   [facility] Quality rating ??? Patient survey rating ????   [facility] Quality rating ??? Patient survey rating???   [facility] Quality rating ????? Patient survey rating ???   [facility] [phone] Quality rating ????? Patient survey rating ????

## 2023-08-08 NOTE — Discharge Summary (Signed)
Physician Discharge Summary   Patient: Ryan [name] MRN: [id number] DOB: [birth year]  Admit date:     08/03/2023  Discharge date: 08/08/23  Discharge Physician: Arnetha Courser   PCP: Kristian Covey, MD   Recommendations at discharge:  Please obtain CBC and BMP on follow-up Please ensure the completion of antibiotic Follow-up with urology in 1 to 2 weeks Follow-up with primary care provider  Discharge Diagnoses: Principal Problem:   Complicated UTI (urinary tract infection) Active Problems:   AKI (acute kidney injury) (HCC)   Acute cystitis with hematuria   Generalized weakness   Hospital Course: The patient is a 78 yr old man who presented to [facility] on 08/03/2023 with complaints of generalized weaknes, chills, and suprapubic pain. The patient had undergone cystoscopy on 08/01/2023 that demonstrated ulcerations of the bladder. He was discharge to home with pyridium and oxycodone. The patient became very lethargic after taking the oxycodone. The patient began having fever, chills, and myalgias on the night of 08/02/2023.    The patient carries a medical history significant for prostate cancer s/p cystoscopy with suprapubic catheter placement on 08/01/2023, GERD, HLD, HTN, Hypothyroidism, SVT, Tachycardia, and lumbar radiculopathy.  Patient was admitted for concern of complicated UTI, history of bladder irritation and ulceration after getting radiation for prostate cancer.  For which he follows closely with urology and has suprapubic catheter in place.  Patient continued to have significant lower abdominal discomfort and bladder spasms which seems chronic and is being going on for few months.  Patient initially received ceftriaxone, urine culture growing Pseudomonas so antibiotics were switched to cefepime, he received 5 days of antibiotics while in the hospital and is being discharged on 5 more days of ciprofloxacin based on susceptibility for later prolonged course of antibiotic.   Hyoscyamine was also added to see if that will help with frequent bladder spasms.  Patient need to follow-up closely with his urologist for further recommendations.  His mild hypokalemia during hospitalization was corrected. PT evaluated him and recommended home health which was ordered.  On presentation he had mild AKI which was resolved with IV fluid before discharge.  Patient will continue on current medications and need to have a close follow-up with his providers for further management.   Pain control - [employer] Controlled Substance Reporting System database was reviewed. and patient was instructed, not to drive, operate heavy machinery, perform activities at heights, swimming or participation in water activities or provide baby-sitting services while on Pain, Sleep and Anxiety Medications; until their outpatient Physician has advised to do so again. Also recommended to not to take more than prescribed Pain, Sleep and Anxiety Medications.  Consultants: None Procedures performed: None Disposition: Home health Diet recommendation:  Discharge Diet Orders (From admission, onward)     Start     Ordered   08/08/23 0000  Diet - low sodium heart healthy        08/08/23 1222           Regular diet DISCHARGE MEDICATION: Allergies as of 08/08/2023       Reactions   Bee Venom Anaphylaxis, Other (See Comments)   Drowsiness, too   Dust Mite Extract Itching, Other (See Comments)   Sneezing, runny nose, itchy eyes   Grass Extracts [gramineae Pollens] Itching, Other (See Comments)   Sneezing, runny nose, itchy eyes        Medication List     STOP taking these medications    aspirin EC 325 MG tablet  TAKE these medications    Advil 200 MG tablet Generic drug: ibuprofen Take 400 mg by mouth 2 (two) times daily as needed (for pain- rotating with 650 mg of EC aspirin).   atorvastatin 40 MG tablet Commonly known as: LIPITOR Take 1 tablet (40 mg total) by mouth  daily. What changed: when to take this   ciprofloxacin 500 MG tablet Commonly known as: Cipro Take 1 tablet (500 mg total) by mouth 2 (two) times daily for 5 days.   diphenoxylate-atropine 2.5-0.025 MG tablet Commonly known as: LOMOTIL Take 1 tablet by mouth every 8 (eight) hours as needed for diarrhea or loose stools.   EPINEPHrine 0.3 mg/0.3 mL Soaj injection Commonly known as: EPI-PEN Inject 0.3 mg into the muscle as needed for anaphylaxis (AS DIRECTED).   feeding supplement Liqd Take 237 mLs by mouth 2 (two) times daily between meals.   fluticasone 50 MCG/ACT nasal spray Commonly known as: FLONASE Place 1 spray into both nostrils daily as needed (FOR SEASONAL ALLERGIES).   gabapentin 300 MG capsule Commonly known as: NEURONTIN Take 300 mg by mouth 3 (three) times daily.   hyoscyamine 0.125 MG SL tablet Commonly known as: LEVSIN SL Place 1 tablet (0.125 mg total) under the tongue every 6 (six) hours as needed (Bladder spasms).   levothyroxine 50 MCG tablet Commonly known as: SYNTHROID Take 50 mcg by mouth daily before breakfast.   liothyronine 5 MCG tablet Commonly known as: CYTOMEL Take 5 mcg by mouth in the morning.   loratadine 10 MG tablet Commonly known as: CLARITIN Take 10 mg by mouth daily as needed for allergies.   metoprolol tartrate 25 MG tablet Commonly known as: LOPRESSOR Take 1 tablet (25 mg total) by mouth 2 (two) times daily.   oxybutynin 10 MG 24 hr tablet Commonly known as: DITROPAN-XL Take 10 mg by mouth daily after breakfast.   oxyCODONE 5 MG immediate release tablet Commonly known as: Oxy IR/ROXICODONE Take 5 mg by mouth every 8 (eight) hours as needed (for pain).   pantoprazole 40 MG tablet Commonly known as: PROTONIX TAKE 1 TABLET BY MOUTH EVERY DAY What changed:  when to take this additional instructions   phenazopyridine 100 MG tablet Commonly known as: PYRIDIUM Take 100 mg by mouth 3 (three) times daily as needed for pain.    Restasis 0.05 % ophthalmic emulsion Generic drug: cycloSPORINE Place 1 drop into both eyes 2 (two) times daily.   tamsulosin 0.4 MG Caps capsule Commonly known as: FLOMAX Take 1 capsule (0.4 mg total) by mouth daily. What changed: when to take this   Uro-MP 118 MG Caps Take 1 capsule by mouth 4 (four) times daily as needed (for bladder spasms).               Durable Medical Equipment  (From admission, onward)           Start     Ordered   08/08/23 0000  For home use only DME 4 wheeled rolling walker with seat       Question:  Patient needs a walker to treat with the following condition  Answer:  Generalized weakness   08/08/23 1222            Follow-up Information     Burchette, Elberta Fortis, MD. Schedule an appointment as soon as possible for a visit in 1 week(s).   Specialty: Family Medicine Contact information: [address] [place] [address] [phone]  Discharge Exam: Filed Weights   08/03/23 1206  Weight: 62.3 kg   General.  Frail elderly man, in no acute distress. Pulmonary.  Lungs clear bilaterally, normal respiratory effort. CV.  Regular rate and rhythm, no JVD, rub or murmur. Abdomen.  Soft, nontender, nondistended, BS positive. CNS.  Alert and oriented .  No focal neurologic deficit. Extremities.  No edema, no cyanosis, pulses intact and symmetrical. Psychiatry.  Judgment and insight appears normal.   Condition at discharge: stable  The results of significant diagnostics from this hospitalization (including imaging, microbiology, ancillary and laboratory) are listed below for reference.   Imaging Studies: DG Chest Port 1 View Result Date: 08/03/2023 CLINICAL DATA:  Sepsis, fever EXAM: PORTABLE CHEST 1 VIEW COMPARISON:  08/21/2021, 08/13/2022 FINDINGS: The heart size and mediastinal contours are within normal limits. No focal airspace consolidation, pleural effusion, or pneumothorax. The visualized skeletal  structures are unremarkable. IMPRESSION: No active disease. Electronically Signed   By: Duanne Guess D.O.   On: 08/03/2023 14:24    Microbiology: Results for orders placed or performed during the hospital encounter of 08/03/23  Blood Culture (routine x 2)     Status: None   Collection Time: 08/03/23 12:27 PM   Specimen: BLOOD RIGHT ARM  Result Value Ref Range Status   Specimen Description   Final    BLOOD RIGHT ARM Performed at [facility], [address], [place], [place] [address]    Special Requests   Final    BOTTLES DRAWN AEROBIC AND ANAEROBIC Blood Culture results may not be optimal due to an inadequate volume of blood received in culture bottles Performed at [facility], [address] [place], [place] [address]    Culture   Final    NO GROWTH 5 DAYS Performed at [facility], [address] [place], [place] [address]    Report Status 08/08/2023 FINAL  Final  Blood Culture (routine x 2)     Status: None   Collection Time: 08/03/23  2:30 PM   Specimen: BLOOD  Result Value Ref Range Status   Specimen Description   Final    BLOOD SITE NOT SPECIFIED Performed at [facility], [address] [place], [place] [address]    Special Requests   Final    BOTTLES DRAWN AEROBIC AND ANAEROBIC Blood Culture adequate volume Performed at [facility], [address] [place], [place] [address]    Culture   Final    NO GROWTH 5 DAYS Performed at [facility], [address], [place], [place] [address]    Report Status 08/08/2023 FINAL  Final  Urine Culture     Status: Abnormal   Collection Time: 08/03/23  3:16 PM   Specimen: Urine, Suprapubic  Result Value Ref Range Status   Specimen Description   Final    URINE, SUPRAPUBIC Performed at [facility], [address], [place], [place] [address]    Special Requests   Final    NONE Performed at [facility], [address], [place], [place] [address]    Culture 50,000 COLONIES/mL PSEUDOMONAS AERUGINOSA (A)  Final   Report Status 08/05/2023 FINAL  Final   Organism ID, Bacteria PSEUDOMONAS AERUGINOSA (A)  Final      Susceptibility   Pseudomonas aeruginosa - MIC*    CEFTAZIDIME <=1 SENSITIVE Sensitive     CIPROFLOXACIN <=0.25 SENSITIVE Sensitive     GENTAMICIN <=1 SENSITIVE Sensitive  IMIPENEM 1 SENSITIVE Sensitive     PIP/TAZO <=4 SENSITIVE Sensitive ug/mL    CEFEPIME 2 SENSITIVE Sensitive     * 50,000 COLONIES/mL PSEUDOMONAS AERUGINOSA  Resp panel by RT-PCR (RSV, Flu A&B, Covid) Peripheral     Status: None   Collection Time: 08/03/23  3:34 PM   Specimen: Peripheral; Nasal Swab  Result Value Ref Range Status   SARS Coronavirus 2 by RT PCR NEGATIVE NEGATIVE Final    Comment: (NOTE) SARS-CoV-2 target nucleic acids are NOT DETECTED.  The SARS-CoV-2 RNA is generally detectable in upper respiratory specimens during the acute phase of infection. The lowest concentration of SARS-CoV-2 viral copies this assay can detect is 138 copies/mL. A negative result does not preclude SARS-Cov-2 infection and should not be used as the sole basis for treatment or other patient management decisions. A negative result may occur with  improper specimen collection/handling, submission of specimen other than nasopharyngeal swab, presence of viral mutation(s) within the areas targeted by this assay, and inadequate number of viral copies(<138 copies/mL). A negative result must be combined with clinical observations, patient history, and epidemiological information. The expected result is Negative.  Fact Sheet for Patients:  BloggerCourse.com  Fact Sheet for Healthcare Providers:  SeriousBroker.it  This test is no t yet approved or cleared by the [place] FDA and  has been authorized for detection and/or diagnosis of SARS-CoV-2 by FDA under an Emergency  Use Authorization (EUA). This EUA will remain  in effect (meaning this test can be used) for the duration of the COVID-19 declaration under Section 564(b)(1) of the Act, 21 U.S.C.section 360bbb-3(b)(1), unless the authorization is terminated  or revoked sooner.       Influenza A by PCR NEGATIVE NEGATIVE Final   Influenza B by PCR NEGATIVE NEGATIVE Final    Comment: (NOTE) The Xpert Xpress SARS-CoV-2/FLU/RSV plus assay is intended as an aid in the diagnosis of influenza from Nasopharyngeal swab specimens and should not be used as a sole basis for treatment. Nasal washings and aspirates are unacceptable for Xpert Xpress SARS-CoV-2/FLU/RSV testing.  Fact Sheet for Patients: BloggerCourse.com  Fact Sheet for Healthcare Providers: SeriousBroker.it  This test is not yet approved or cleared by the [place] FDA and has been authorized for detection and/or diagnosis of SARS-CoV-2 by FDA under an Emergency Use Authorization (EUA). This EUA will remain in effect (meaning this test can be used) for the duration of the COVID-19 declaration under Section 564(b)(1) of the Act, 21 U.S.C. section 360bbb-3(b)(1), unless the authorization is terminated or revoked.     Resp Syncytial Virus by PCR NEGATIVE NEGATIVE Final    Comment: (NOTE) Fact Sheet for Patients: BloggerCourse.com  Fact Sheet for Healthcare Providers: SeriousBroker.it  This test is not yet approved or cleared by the [place] FDA and has been authorized for detection and/or diagnosis of SARS-CoV-2 by FDA under an Emergency Use Authorization (EUA). This EUA will remain in effect (meaning this test can be used) for the duration of the COVID-19 declaration under Section 564(b)(1) of the Act, 21 U.S.C. section 360bbb-3(b)(1), unless the authorization is terminated or revoked.  Performed at [facility],  [address], [place], [place] [address]     Labs: CBC: Recent Labs  Lab 08/03/23 1228 08/03/23 1238 08/03/23 1459 08/04/23 0421 08/05/23 0917 08/06/23 0634 08/07/23 0552  WBC 14.1*  --   --  9.3 8.1 6.6 5.5  NEUTROABS 11.9*  --   --   --  6.3 5.1 3.8  HGB 14.0   < > 13.6 11.7* 12.7* 11.1* 11.2*  HCT 42.9   < > 40.0 36.4* 39.6 34.2* 34.3*  MCV 90.1  --   --  90.5 90.8 89.5 88.4  PLT 201  --   --  133* 114* 124* 132*   < > = values in this interval not displayed.   Basic Metabolic Panel: Recent Labs  Lab 08/04/23 0421 08/05/23 0917 08/06/23 0634 08/07/23 0552 08/08/23 0520  NA 135 141 136 138 138  K 3.5 3.7 3.1* 3.2* 3.7  CL 106 108 106 105 106  CO2 22 23 21* 25 24  GLUCOSE 110* 83 120* 94 108*  BUN 20 18 18 20 20   CREATININE 1.45* 1.23 1.09 1.15 1.00  CALCIUM 8.0* 8.9 8.4* 8.5* 8.8*  MG  --   --   --  2.2 2.1   Liver Function Tests: Recent Labs  Lab 08/03/23 1228  AST 90*  ALT 65*  ALKPHOS 113  BILITOT 1.5*  PROT 7.2  ALBUMIN 4.2   CBG: No results for input(s): "GLUCAP" in the last 168 hours.  Discharge time spent: greater than 30 minutes.  This record has been created using Conservation officer, historic buildings. Errors have been sought and corrected,but may not always be located. Such creation errors do not reflect on the standard of care.   Signed: Arnetha Courser, MD [facility] 08/08/2023

## 2023-08-08 NOTE — Evaluation (Signed)
Physical Therapy Evaluation Patient Details Name: Ryan [name] MRN: [id number] DOB: 12/07/1945 Today's Date: 08/08/2023  History of Present Illness  78 yo male admitted with complicated UTI, weakness. Hx of prostate ca, suprapubic catheter, SVT, lumbar radiculopathy  Clinical Impression  On eval, pt was CGA for mobility. He walked ~125 feet with a RW. Pt presents with general weakness, decreased activity tolerance, and impaired gait and balance. Will recommend HHPT f/u and RW use for ambulation safety. Will plan pt during hospital stay.        If plan is discharge home, recommend the following: A little help with walking and/or transfers;A little help with bathing/dressing/bathroom;Assistance with cooking/housework;Assist for transportation;Help with stairs or ramp for entrance   Can travel by private vehicle        Equipment Recommendations Rolling walker (2 wheels)  Recommendations for Other Services       Functional Status Assessment Patient has had a recent decline in their functional status and demonstrates the ability to make significant improvements in function in a reasonable and predictable amount of time.     Precautions / Restrictions Precautions Precautions: Fall Restrictions Weight Bearing Restrictions Per Provider Order: No      Mobility  Bed Mobility Overal bed mobility: Needs Assistance Bed Mobility: Supine to Sit     Supine to sit: Contact guard, HOB elevated, Used rails     General bed mobility comments: Increased time and effort. Cues provided.    Transfers Overall transfer level: Needs assistance Equipment used: Rolling walker (2 wheels) Transfers: Sit to/from Stand Sit to Stand: From elevated surface, Contact guard assist           General transfer comment: Increased time and effort. Cues provided    Ambulation/Gait Ambulation/Gait assistance: Contact guard assist Gait Distance (Feet): 125 Feet Assistive device: Rolling walker (2  wheels) Gait Pattern/deviations: Step-through pattern, Decreased stride length       General Gait Details: No LOB with RW. Tolerated distance well.  Stairs            Wheelchair Mobility     Tilt Bed    Modified Rankin (Stroke Patients Only)       Balance Overall balance assessment: Needs assistance         Standing balance support: Reliant on assistive device for balance, During functional activity Standing balance-Leahy Scale: Fair                               Pertinent Vitals/Pain Pain Assessment Pain Assessment: Faces Faces Pain Scale: Hurts even more Pain Location: lower abdomen Pain Descriptors / Indicators: Aching, Spasm Pain Intervention(s): Monitored during session    Home Living Family/patient expects to be discharged to:: Private residence Living Arrangements: Spouse/significant other   Type of Home: House Home Access: Stairs to enter   Secretary/administrator of Steps: garage-4; front-5 + handrail   Home Layout: One level Home Equipment: None      Prior Function Prior Level of Function : Independent/Modified Independent                     Extremity/Trunk Assessment   Upper Extremity Assessment Upper Extremity Assessment: Generalized weakness    Lower Extremity Assessment Lower Extremity Assessment: Generalized weakness    Cervical / Trunk Assessment Cervical / Trunk Assessment: Normal  Communication   Communication Communication: Hearing impairment  Cognition Arousal: Alert Behavior During Therapy: WFL for tasks assessed/performed Overall Cognitive Status:  Within Functional Limits for tasks assessed                                          General Comments      Exercises     Assessment/Plan    PT Assessment Patient needs continued PT services  PT Problem List Decreased strength;Decreased range of motion;Decreased activity tolerance;Decreased balance;Decreased mobility;Decreased  knowledge of use of DME;Pain       PT Treatment Interventions DME instruction;Gait training;Functional mobility training;Therapeutic activities;Therapeutic exercise;Patient/family education;Balance training    PT Goals (Current goals can be found in the Care Plan section)  Acute Rehab PT Goals Patient Stated Goal: to regain PLOF PT Goal Formulation: With patient Time For Goal Achievement: 08/22/23 Potential to Achieve Goals: Good    Frequency Min 1X/week     Co-evaluation               AM-PAC PT "6 Clicks" Mobility  Outcome Measure Help needed turning from your back to your side while in a flat bed without using bedrails?: A Little Help needed moving from lying on your back to sitting on the side of a flat bed without using bedrails?: A Little Help needed moving to and from a bed to a chair (including a wheelchair)?: A Little Help needed standing up from a chair using your arms (e.g., wheelchair or bedside chair)?: A Little Help needed to walk in hospital room?: A Little Help needed climbing 3-5 steps with a railing? : A Little 6 Click Score: 18    End of Session Equipment Utilized During Treatment: Gait belt Activity Tolerance: Patient tolerated treatment well Patient left: with call bell/phone within reach (in bathroom with instructions to pull call light string)   PT Visit Diagnosis: Difficulty in walking, not elsewhere classified (R26.2);Muscle weakness (generalized) (M62.81)    Time: 6213-0865 PT Time Calculation (min) (ACUTE ONLY): 21 min   Charges:   PT Evaluation $PT Eval Low Complexity: 1 Low   PT General Charges $$ ACUTE PT VISIT: 1 Visit            Faye Ramsay, PT Acute Rehabilitation  Office: [phone]

## 2023-08-08 NOTE — TOC Transition Note (Signed)
Transition of Care [facility]) - Discharge Note   Patient Details  Name: Ryan [name] MRN: [id number] Date of Birth: February 14, 1946  [facility] of [facility]) CM/SW Contact:  Diona Browner, LCSW Phone Number: 08/08/2023, 12:59 PM   Clinical Narrative:    Met with pt at bedside. Pt recommended to HHPT and RW. Pt accepting of HHPT services. HHPT set up w/ [facility]. RW to be delivered to room prior to d/c from [facility]. No further TOC needs.   Final next level of care: Home w Home Health Services Barriers to Discharge: No Barriers Identified   Patient Goals and CMS Choice Patient states their goals for this hospitalization and ongoing recovery are:: return to baseline CMS Medicare.gov Compare Post Acute Care list provided to:: Patient Choice offered to / list presented to : Patient [place] ownership interest in [facility].provided to::  (NA)    Discharge Placement                       Discharge Plan and Services Additional resources added to the After Visit Summary for   In-house Referral: Clinical Social Work Discharge Planning Services: NA Post Acute Care Choice: Home Health          DME Arranged: Dan Humphreys rolling DME Agency: [employer] Date DME Agency Contacted: 08/08/23 Time DME Agency Contacted: 1245 Representative spoke with at DME Agency: Vaughan Basta HH Arranged: PT HH Agency: [facility] Date [facility] Agency Contacted: 08/08/23 Time HH Agency Contacted: 1245 Representative spoke with at [facility] Agency: Kandee Keen  Social Drivers of Health (SDOH) Interventions SDOH Screenings   Food Insecurity: No Food Insecurity (08/04/2023)  Housing: Low Risk  (08/04/2023)  Transportation Needs: No Transportation Needs (08/04/2023)  Utilities: Not At Risk (08/04/2023)  Alcohol Screen: Low Risk  (12/29/2022)  Depression (PHQ2-9): Low Risk  (12/29/2022)  Financial Resource Strain: Low Risk  (12/29/2022)  Physical Activity: Sufficiently Active (12/29/2022)  Social  Connections: Socially Integrated (08/04/2023)  Stress: No Stress Concern Present (12/29/2022)  Tobacco Use: Medium Risk (08/04/2023)     Readmission Risk Interventions     No data to display

## 2023-08-09 ENCOUNTER — Telehealth: Payer: Self-pay

## 2023-08-09 NOTE — Transitions of Care (Post Inpatient/ED Visit) (Signed)
 08/09/2023  Name: Ryan [name] MRN: [id number] DOB: 1946/01/16  Today's TOC FU Call Status: Today's TOC FU Call Status:: Successful TOC FU Call Completed TOC FU Call Complete Date: 08/09/23 Patient's Name and Date of Birth confirmed.  Transition Care Management Follow-up Telephone Call Date of Discharge: 08/08/23 Discharge Facility: [facility]) Type of Discharge: Inpatient Admission Primary Inpatient Discharge Diagnosis:: cystitis How have you been since you were released from the hospital?: Better Any questions or concerns?: No  Items Reviewed: Did you receive and understand the discharge instructions provided?: Yes Medications obtained,verified, and reconciled?: Yes (Medications Reviewed) Any new allergies since your discharge?: No Dietary orders reviewed?: Yes Do you have support at home?: Yes People in Home: spouse  Medications Reviewed Today: Medications Reviewed Today     Reviewed by Emmitt Pan, LPN (Licensed Practical Nurse) on 08/09/23 at 1049  Med List Status: <None>   Medication Order Taking? Sig Documenting Provider Last Dose Status Informant  ADVIL 200 MG tablet [id number] No Take 400 mg by mouth 2 (two) times daily as needed (for pain- rotating with 650 mg of EC aspirin). [provider] Past Week Active Spouse/Significant Other  atorvastatin  (LIPITOR) 40 MG tablet [id number] No Take 1 tablet (40 mg total) by mouth daily.  Patient taking differently: Take 40 mg by mouth at bedtime.   Delford Maude BROCKS, MD 08/01/2023 Active Spouse/Significant Other  ciprofloxacin  (CIPRO ) 500 MG tablet [id number]  Take 1 tablet (500 mg total) by mouth 2 (two) times daily for 5 days. Amin, Sumayya, MD  Active   diphenoxylate-atropine  (LOMOTIL) 2.5-0.025 MG tablet [id number]  Take 1 tablet by mouth every 8 (eight) hours as needed for diarrhea or loose stools. [provider]  Active Spouse/Significant Other  EPINEPHrine  0.3 mg/0.3 mL IJ SOAJ injection  [id number] No Inject 0.3 mg into the muscle as needed for anaphylaxis (AS DIRECTED). [provider] Taking Active Spouse/Significant Other  feeding supplement (ENSURE ENLIVE / ENSURE PLUS) LIQD [id number]  Take 237 mLs by mouth 2 (two) times daily between meals. Amin, Sumayya, MD  Active   fluticasone [facility]) 50 MCG/ACT nasal spray [id number] No Place 1 spray into both nostrils daily as needed (FOR SEASONAL ALLERGIES). [provider] Taking Active Spouse/Significant Other  gabapentin  (NEURONTIN ) 300 MG capsule [id number] No Take 300 mg by mouth 3 (three) times daily. [provider] 08/02/2023 Active Spouse/Significant Other  hyoscyamine  (LEVSIN  SL) 0.125 MG SL tablet [id number]  Place 1 tablet (0.125 mg total) under the tongue every 6 (six) hours as needed (Bladder spasms). Amin, Sumayya, MD  Active   levothyroxine  (SYNTHROID ) 50 MCG tablet [id number] No Take 50 mcg by mouth daily before breakfast. [provider] 08/03/2023 Active Spouse/Significant Other  liothyronine  (CYTOMEL ) 5 MCG tablet [id number] No Take 5 mcg by mouth in the morning. [provider] 08/03/2023 Active Spouse/Significant Other  loratadine (CLARITIN) 10 MG tablet [id number] No Take 10 mg by mouth daily as needed for allergies.  [provider] Taking Active Spouse/Significant Other  Meth-Hyo-M Bl-Na Phos-Ph Sal (URO-MP ) 118 MG CAPS [id number] No Take 1 capsule by mouth 4 (four) times daily as needed (for bladder spasms). [provider] Past Week Active Spouse/Significant Other  metoprolol  tartrate (LOPRESSOR ) 25 MG tablet [id number] No Take 1 tablet (25 mg total) by mouth 2 (two) times daily. Nishan, Peter C, MD 08/03/2023  9:30 AM Active Spouse/Significant Other  oxybutynin  (DITROPAN -XL) 10 MG 24 hr tablet [id number] No Take 10 mg by mouth daily after breakfast. [provider] 08/02/2023  Morning Active Spouse/Significant Other  oxyCODONE  (OXY IR/ROXICODONE ) 5 MG  immediate release tablet [id number] No Take 5 mg by mouth every 8 (eight) hours as needed (for pain). [provider] Past Week Active Spouse/Significant Other  pantoprazole  (PROTONIX ) 40 MG tablet [id number] No TAKE 1 TABLET BY MOUTH EVERY DAY  Patient taking differently: Take 40 mg by mouth See admin instructions. Take 40 mg by mouth MIDDAY   Abran Norleen SAILOR, MD 08/02/2023 12:00 PM Active Spouse/Significant Other  phenazopyridine  (PYRIDIUM ) 100 MG tablet [id number] No Take 100 mg by mouth 3 (three) times daily as needed for pain. [provider] 08/02/2023 Active Spouse/Significant Other  RESTASIS  0.05 % ophthalmic emulsion [id number] No Place 1 drop into both eyes 2 (two) times daily. [provider] 08/02/2023 Active Spouse/Significant Other  tamsulosin  (FLOMAX ) 0.4 MG CAPS capsule [id number] No Take 1 capsule (0.4 mg total) by mouth daily.  Patient taking differently: Take 0.4 mg by mouth 2 (two) times daily.   Sherwood Rise, PA-C 08/02/2023 Active Spouse/Significant Other            Home Care and Equipment/Supplies: Were Home Health Services Ordered?: Yes Name of Home Health Agency:: unknown Has Agency set up a time to come to your home?: No Any new equipment or medical supplies ordered?: Yes Name of Medical supply agency?: unknown Were you able to get the equipment/medical supplies?: Yes Do you have any questions related to the use of the equipment/supplies?: No  Functional Questionnaire: Do you need assistance with bathing/showering or dressing?: No Do you need assistance with meal preparation?: No Do you need assistance with eating?: No Do you have difficulty maintaining continence: No Do you need assistance with getting out of bed/getting out of a chair/moving?: No Do you have difficulty managing or taking your medications?: Yes  Follow up appointments reviewed: PCP Follow-up appointment confirmed?: Yes Date of PCP follow-up appointment?:  08/12/23 Follow-up Provider: [facility] Follow-up appointment confirmed?: Yes Date of Specialist follow-up appointment?: 08/15/23 Follow-Up Specialty Provider:: uro Do you need transportation to your follow-up appointment?: No Do you understand care options if your condition(s) worsen?: Yes-patient verbalized understanding    SIGNATURE Julian Lemmings, LPN [facility] Nurse Health Advisor Direct Dial [phone]

## 2023-08-12 ENCOUNTER — Encounter: Payer: Self-pay | Admitting: Family Medicine

## 2023-08-12 ENCOUNTER — Ambulatory Visit (INDEPENDENT_AMBULATORY_CARE_PROVIDER_SITE_OTHER): Payer: Medicare Other | Admitting: Family Medicine

## 2023-08-12 VITALS — BP 120/60 | HR 85 | Temp 97.5°F | Wt 160.7 lb

## 2023-08-12 DIAGNOSIS — N390 Urinary tract infection, site not specified: Secondary | ICD-10-CM

## 2023-08-12 DIAGNOSIS — Z23 Encounter for immunization: Secondary | ICD-10-CM

## 2023-08-12 DIAGNOSIS — N3289 Other specified disorders of bladder: Secondary | ICD-10-CM

## 2023-08-12 DIAGNOSIS — E039 Hypothyroidism, unspecified: Secondary | ICD-10-CM

## 2023-08-12 MED ORDER — TRAMADOL HCL 50 MG PO TABS: 50.0000 mg | ORAL_TABLET | Freq: Four times a day (QID) | ORAL | 0 refills | Status: AC | PRN

## 2023-08-12 NOTE — Patient Instructions (Signed)
 I will send in Tramadol  to your pharmacy to try and give me some feedback.

## 2023-08-12 NOTE — Progress Notes (Signed)
 Established Patient Office Visit  Subjective   Patient ID: Ryan [name], male    DOB: 15-May-1946  Age: 78 y.o. MRN: [id number]  No chief complaint on file.   HPI   Ryan [name] is here for hospital follow-up.  He has history of hypertension, hypothyroidism, low testosterone , prostate cancer, hyperlipidemia.  He underwent radiation seed implants last year and has had complications since then.  Has had severely ulcerated bladder and underwent cystoscopy along with suprapubic catheter placement on the 27th.  He was discharged home with Pyridium  and oxycodone .  He became very lethargic after takes the oxycodone  and subsequently developed fever, chills, myalgias on the night of the 28th.  Was admitted with concerns for complicated UTI.  He initially received ceftriaxone  and urine culture grew out Pseudomonas so antibiotics switched to cefepime .  He was then discharged after 5 days of that on 5 more days of Cipro .  Also had hyoscyamine  added for bladder spasms.  He has basically since July had severe intermittent bladder pains presumably related to his severe ulcerative disease.  He is on multiple antispasm medications without much relief.  He had some initial acute kidney injury secondary to dehydration but this improved with hydration.  He had slightly low potassium which corrected with replacement.  Hemoglobin after hydration 11.2.  TSH was slightly low 0.25.  This is followed through the [facility].  Currently on levothyroxine  50 mcg daily.  Overall stable.  Still has fatigue and intermittent pain.  His main complaint is intermittent severe bladder spasm pain.  He only has 3 oxycodone  left and would like to try to find something slightly milder in terms of risk of side effects.  No further fever.  Appetite gradually improving.  He has follow-up with his urologist Monday.  He does have walker for home use and has physical therapist coming out once a week  Past Medical History:  Diagnosis Date   Arthritis  03/28/2013   Back pain    Cancer (HCC) 2014   prostate cancer   Cataracts, bilateral    Dysrhythmia    occasional PAC- asymptomatic, per MD pt cut back on caffeine   ED (erectile dysfunction) 12/18/2021   due to arterial insufficency   Fungal nail infection    GERD (gastroesophageal reflux disease)    Hearing loss    wear hearing aids   HSV infection 2016   Hyperlipidemia    Hypertension    no meds, dx yrs ago per patient but normal last several yrs   Hypertensive retinopathy    OU   Hypothyroidism    Personal history of other diseases of the digestive system    Personal history of other diseases of the nervous system and sense organs 2014   Personal history of other endocrine, nutritional and metabolic disease 7985   Pneumonia    x 1   Primary testicular hypogonadism 2017   Retarded ejaculation 2021   Sleep apnea 2014   history of, did not qualify for cpap   Past Surgical History:  Procedure Laterality Date   ankle surgery trauma Right    CATARACT EXTRACTION Left 11/29/2018   Dr. Roz   COLONOSCOPY     COSMETIC SURGERY     loose skin removed around neck area   CYSTOSCOPY N/A 01/31/2023   Procedure: CYSTOSCOPY;  Surgeon: Selma Donnice SAUNDERS, MD;  Location[facility];  Service: Urology;  Laterality: N/A;   eye lid surgery     EYE SURGERY  fungal nail     LASIK Bilateral    PARS PLANA VITRECTOMY Left 03/01/2019   Procedure: PARS PLANA VITRECTOMY WITH 25 GAUGE;  Surgeon: Valdemar Rogue, MD;  Location: [facility] OR;  Service: Ophthalmology;  Laterality: Left;   PROSTATE BIOPSY  06/21/2022   2020, 2018   RADIOACTIVE SEED IMPLANT N/A 01/31/2023   Procedure: RADIOACTIVE SEED IMPLANT/BRACHYTHERAPY IMPLANT;  Surgeon: Selma Donnice SAUNDERS, MD;  Location[facility];  Service: Urology;  Laterality: N/A;  90 MINUTES NEEDED FOR CASE   REMOVAL RETAINED LENS Left 03/01/2019   Procedure: REMOVAL OF RETAINED LENS MATERIALS LEFT EYE;  Surgeon: Valdemar Rogue, MD;   [facility];  Service: Ophthalmology;  Laterality: Left;   SPACE OAR INSTILLATION N/A 01/31/2023   Procedure: SPACE OAR INSTILLATION;  Surgeon: Selma Donnice SAUNDERS, MD;  Location: [facility];  Service: Urology;  Laterality: N/A;   VASECTOMY  1981   WISDOM TOOTH EXTRACTION      reports that he quit smoking about 42 years ago. His smoking use included cigarettes. He has never used smokeless tobacco. He reports current alcohol  use of about 3.0 - 4.0 standard drinks of alcohol  per week. He reports that he does not use drugs. family history includes Alcohol  abuse in his father; Arthritis in his sister; COPD in his sister; Cancer in his father; Heart disease in his mother; Liver disease in his father. Allergies  Allergen Reactions   Bee Venom Anaphylaxis and Other (See Comments)    Drowsiness, too   Dust Mite Extract Itching and Other (See Comments)    Sneezing, runny nose, itchy eyes   Grass Extracts [Gramineae Pollens] Itching and Other (See Comments)    Sneezing, runny nose, itchy eyes    Review of Systems  Constitutional:  Positive for malaise/fatigue. Negative for chills and fever.  Respiratory:  Negative for shortness of breath.   Cardiovascular:  Negative for chest pain.      Objective:     BP 120/60 (BP Location: Left Arm, Patient Position: Sitting, Cuff Size: Normal)   Pulse 85   Temp (!) 97.5 F (36.4 C) (Oral)   Wt 160 lb 11.2 oz (72.9 kg)   SpO2 95%   BMI 24.43 kg/m  BP Readings from Last 3 Encounters:  08/12/23 120/60  08/08/23 116/70  06/16/23 132/73   Wt Readings from Last 3 Encounters:  08/12/23 160 lb 11.2 oz (72.9 kg)  08/03/23 137 lb 6.4 oz (62.3 kg)  04/26/23 160 lb (72.6 kg)      Physical Exam Vitals reviewed.  Constitutional:      General: He is not in acute distress.    Appearance: He is not ill-appearing.  Cardiovascular:     Rate and Rhythm: Normal rate and regular rhythm.  Pulmonary:     Effort: Pulmonary effort is normal.      Breath sounds: Normal breath sounds.  Musculoskeletal:     Right lower leg: No edema.     Left lower leg: No edema.  Neurological:     Mental Status: He is alert.      No results found for any visits on 08/12/23.  Last CBC Lab Results  Component Value Date   WBC 5.5 08/07/2023   HGB 11.2 (L) 08/07/2023   HCT 34.3 (L) 08/07/2023   MCV 88.4 08/07/2023   MCH 28.9 08/07/2023   RDW 14.6 08/07/2023   PLT 132 (L) 08/07/2023   Last metabolic panel Lab Results  Component Value Date   GLUCOSE 108 (  H) 08/08/2023   NA 138 08/08/2023   K 3.7 08/08/2023   CL 106 08/08/2023   CO2 24 08/08/2023   BUN 20 08/08/2023   CREATININE 1.00 08/08/2023   GFRNONAA >60 08/08/2023   CALCIUM  8.8 (L) 08/08/2023   PROT 7.2 08/03/2023   ALBUMIN 4.2 08/03/2023   BILITOT 1.5 (H) 08/03/2023   ALKPHOS 113 08/03/2023   AST 90 (H) 08/03/2023   ALT 65 (H) 08/03/2023   ANIONGAP 8 08/08/2023   Last thyroid  functions Lab Results  Component Value Date   TSH 0.255 (L) 08/04/2023      The 10-year ASCVD risk score (Arnett DK, et al., 2019) is: 22.8%    Assessment & Plan:   Recent hospitalization for complicated UTI with Pseudomonas following recent cystoscopy and placement of suprapubic catheter.  Patient has continued for months now to have severe intermittent bladder pains and suprapubic discomfort.  No relief with hyoscyamine .  Tried other antispasmodics without success.  We wrote for limited tramadol  50 mg 1 every 6 hours as needed  Follow-up promptly for any fever We did discuss the fact that his thyroid  was over replaced with TSH 0.25 and [facility] .  He also plans to have follow-up labs at the [facility] and will get chemistries and follow-up CBC then. No follow-ups on file.    Wolm Scarlet, MD

## 2023-08-15 ENCOUNTER — Other Ambulatory Visit (HOSPITAL_COMMUNITY): Payer: Self-pay

## 2023-08-15 DIAGNOSIS — R300 Dysuria: Secondary | ICD-10-CM | POA: Diagnosis not present

## 2023-08-15 DIAGNOSIS — C61 Malignant neoplasm of prostate: Secondary | ICD-10-CM | POA: Diagnosis not present

## 2023-08-16 ENCOUNTER — Other Ambulatory Visit (HOSPITAL_COMMUNITY): Payer: Self-pay

## 2023-08-16 DIAGNOSIS — T83010A Breakdown (mechanical) of cystostomy catheter, initial encounter: Secondary | ICD-10-CM | POA: Diagnosis not present

## 2023-08-16 MED ORDER — BUPIVACAINE HCL (PF) 0.5 % IJ SOLN
15.0000 mL | Freq: Two times a day (BID) | INTRAMUSCULAR | 99 refills | Status: DC
  Filled 2023-08-16: qty 1500, 25d supply, fill #0
  Filled 2023-09-12: qty 750, 25d supply, fill #1
  Filled 2023-09-15: qty 330, 11d supply, fill #1

## 2023-08-16 MED ORDER — HEPARIN SODIUM (PORCINE) 10000 UNIT/ML IJ SOLN
40000.0000 [IU] | Freq: Two times a day (BID) | INTRAMUSCULAR | 99 refills | Status: DC
Start: 2023-08-16 — End: 2024-04-26
  Filled 2023-08-16: qty 200, 25d supply, fill #0
  Filled 2023-08-17: qty 40, 5d supply, fill #0
  Filled 2023-09-12: qty 175, 22d supply, fill #1
  Filled 2023-09-15: qty 240, 30d supply, fill #1

## 2023-08-17 ENCOUNTER — Other Ambulatory Visit (HOSPITAL_COMMUNITY): Payer: Self-pay

## 2023-08-18 ENCOUNTER — Other Ambulatory Visit (HOSPITAL_COMMUNITY): Payer: Self-pay

## 2023-08-23 DIAGNOSIS — R339 Retention of urine, unspecified: Secondary | ICD-10-CM | POA: Diagnosis not present

## 2023-08-23 DIAGNOSIS — T83010A Breakdown (mechanical) of cystostomy catheter, initial encounter: Secondary | ICD-10-CM | POA: Diagnosis not present

## 2023-08-26 ENCOUNTER — Encounter: Payer: Self-pay | Admitting: Family Medicine

## 2023-08-29 MED ORDER — TRAMADOL HCL 50 MG PO TABS
50.0000 mg | ORAL_TABLET | Freq: Four times a day (QID) | ORAL | 0 refills | Status: AC | PRN
Start: 2023-08-29 — End: 2023-09-03

## 2023-08-29 NOTE — Telephone Encounter (Signed)
 I sent in a refill of the tramadol  Kristian Covey MD [facility]

## 2023-09-05 DIAGNOSIS — R339 Retention of urine, unspecified: Secondary | ICD-10-CM | POA: Diagnosis not present

## 2023-09-05 DIAGNOSIS — R300 Dysuria: Secondary | ICD-10-CM | POA: Diagnosis not present

## 2023-09-05 DIAGNOSIS — C61 Malignant neoplasm of prostate: Secondary | ICD-10-CM | POA: Diagnosis not present

## 2023-09-12 ENCOUNTER — Other Ambulatory Visit (HOSPITAL_COMMUNITY): Payer: Self-pay

## 2023-09-13 ENCOUNTER — Other Ambulatory Visit (HOSPITAL_COMMUNITY): Payer: Self-pay

## 2023-09-14 ENCOUNTER — Other Ambulatory Visit (HOSPITAL_COMMUNITY): Payer: Self-pay

## 2023-09-14 DIAGNOSIS — N3289 Other specified disorders of bladder: Secondary | ICD-10-CM | POA: Diagnosis not present

## 2023-09-14 DIAGNOSIS — Z961 Presence of intraocular lens: Secondary | ICD-10-CM | POA: Diagnosis not present

## 2023-09-14 DIAGNOSIS — H40012 Open angle with borderline findings, low risk, left eye: Secondary | ICD-10-CM | POA: Diagnosis not present

## 2023-09-15 ENCOUNTER — Other Ambulatory Visit (HOSPITAL_COMMUNITY): Payer: Self-pay

## 2023-09-16 ENCOUNTER — Other Ambulatory Visit (HOSPITAL_COMMUNITY): Payer: Self-pay

## 2023-09-26 DIAGNOSIS — N320 Bladder-neck obstruction: Secondary | ICD-10-CM | POA: Diagnosis not present

## 2023-09-26 DIAGNOSIS — N3289 Other specified disorders of bladder: Secondary | ICD-10-CM | POA: Diagnosis not present

## 2023-09-26 DIAGNOSIS — R300 Dysuria: Secondary | ICD-10-CM | POA: Diagnosis not present

## 2023-09-26 DIAGNOSIS — C61 Malignant neoplasm of prostate: Secondary | ICD-10-CM | POA: Diagnosis not present

## 2023-09-26 DIAGNOSIS — Z466 Encounter for fitting and adjustment of urinary device: Secondary | ICD-10-CM | POA: Diagnosis not present

## 2023-09-26 DIAGNOSIS — R339 Retention of urine, unspecified: Secondary | ICD-10-CM | POA: Diagnosis not present

## 2023-10-10 DIAGNOSIS — R300 Dysuria: Secondary | ICD-10-CM | POA: Diagnosis not present

## 2023-10-10 DIAGNOSIS — C61 Malignant neoplasm of prostate: Secondary | ICD-10-CM | POA: Diagnosis not present

## 2023-10-10 DIAGNOSIS — R339 Retention of urine, unspecified: Secondary | ICD-10-CM | POA: Diagnosis not present

## 2023-10-10 DIAGNOSIS — Z466 Encounter for fitting and adjustment of urinary device: Secondary | ICD-10-CM | POA: Diagnosis not present

## 2023-10-18 ENCOUNTER — Encounter: Payer: Self-pay | Admitting: Family Medicine

## 2023-10-18 DIAGNOSIS — N3289 Other specified disorders of bladder: Secondary | ICD-10-CM

## 2023-10-24 ENCOUNTER — Telehealth: Payer: Self-pay

## 2023-10-24 NOTE — Telephone Encounter (Signed)
 Copied from CRM [id number]. Topic: Referral - Request for Referral >> Oct 24, 2023  8:38 AM Clydene Darner H wrote: Did the patient discuss referral with their provider in the last year? Yes (If No - schedule appointment) (If Yes - send message)  Appointment offered? No  Type of order/referral and detailed reason for visit: Urology  Patient's wife called this morning to follow up on a MyChart message sent on Monday, 10/18/23, regarding a referral request to a [facility] urologist. She stated she has not received a response. I informed her that the provider's CMA responded to the message earlier this morning. She also inquired about the possibility of being seen sooner by Dr. Elidia Grout or Dr. Maxey Spangle, MD, for a second opinion.   Preference of office, provider, location: [facility] - [facility] [address] [place], [address]  [place], [place] [address]  [phone]  If referral order, have you been seen by this specialty before? Yes (If Yes, this issue or another issue? When? Where?  Can we respond through MyChart? Yes

## 2023-10-24 NOTE — Telephone Encounter (Signed)
 Please see Mychart encounter from 10/18/2023

## 2023-11-01 ENCOUNTER — Encounter: Payer: Self-pay | Admitting: Cardiovascular Disease

## 2023-11-07 DIAGNOSIS — M6289 Other specified disorders of muscle: Secondary | ICD-10-CM | POA: Diagnosis not present

## 2023-11-07 DIAGNOSIS — R339 Retention of urine, unspecified: Secondary | ICD-10-CM | POA: Diagnosis not present

## 2023-11-07 DIAGNOSIS — N3289 Other specified disorders of bladder: Secondary | ICD-10-CM | POA: Diagnosis not present

## 2023-11-09 ENCOUNTER — Telehealth: Payer: Self-pay

## 2023-11-09 DIAGNOSIS — M6289 Other specified disorders of muscle: Secondary | ICD-10-CM

## 2023-11-09 DIAGNOSIS — N3289 Other specified disorders of bladder: Secondary | ICD-10-CM

## 2023-11-09 NOTE — Addendum Note (Signed)
 Addended by: Aurelio Leer on: 11/09/2023 01:21 PM   Modules accepted: Orders

## 2023-11-09 NOTE — Telephone Encounter (Signed)
 Copied from CRM [id number]. Topic: Referral - Question >> Nov 09, 2023  8:43 AM Dimple Francis wrote: Reason for CRM: Patient needing to get a referral for pelvic floor physical therapy from [facility] [phone]

## 2023-11-09 NOTE — Telephone Encounter (Signed)
Left detailed message informing patient that referral was placed

## 2023-11-11 ENCOUNTER — Ambulatory Visit: Admitting: Family Medicine

## 2023-11-13 NOTE — Therapy (Signed)
 OUTPATIENT PHYSICAL THERAPY MALE PELVIC EVALUATION   Patient Name: Ryan [name] MRN: [id number] DOB:[birth year], 78 y.o., male Today's Date: 11/14/2023  END OF SESSION:  PT End of Session - 11/14/23 1813     Visit Number 1    Date for PT Re-Evaluation 02/14/24    Authorization Type MEDICARE PART A AND B    PT Start Time 1400    PT Stop Time 1505    PT Time Calculation (min) 65 min    Activity Tolerance Patient tolerated treatment well;Patient limited by fatigue;Patient limited by pain             Past Medical History:  Diagnosis Date   Arthritis 03/28/2013   Back pain    Cancer (HCC) 2014   prostate cancer   Cataracts, bilateral    Dysrhythmia    occasional PAC- asymptomatic, per MD pt cut back on caffeine   ED (erectile dysfunction) 12/18/2021   due to arterial insufficency   Fungal nail infection    GERD (gastroesophageal reflux disease)    Hearing loss    wear hearing aids   HSV infection 2016   Hyperlipidemia    Hypertension    no meds, dx yrs ago per patient but normal last several yrs   Hypertensive retinopathy    OU   Hypothyroidism    Personal history of other diseases of the digestive system    Personal history of other diseases of the nervous system and sense organs 2014   Personal history of other endocrine, nutritional and metabolic disease 6578   Pneumonia    x 1   Primary testicular hypogonadism 2017   Retarded ejaculation 2021   Sleep apnea 2014   history of, did not qualify for cpap   Past Surgical History:  Procedure Laterality Date   ankle surgery trauma Right    CATARACT EXTRACTION Left 11/29/2018   Dr. Gennie Kicks   COLONOSCOPY     COSMETIC SURGERY     loose skin removed around neck area   CYSTOSCOPY N/A 01/31/2023   Procedure: CYSTOSCOPY;  Surgeon: Lahoma Pigg, MD;  Location: [facility];  Service: Urology;  Laterality: N/A;   eye lid surgery     EYE SURGERY     fungal nail     LASIK Bilateral    PARS PLANA  VITRECTOMY Left 03/01/2019   Procedure: PARS PLANA VITRECTOMY WITH 25 GAUGE;  Surgeon: Ronelle Coffee, MD;  Location[facility] OR;  Service: Ophthalmology;  Laterality: Left;   PROSTATE BIOPSY  06/21/2022   2020, 2018   RADIOACTIVE SEED IMPLANT N/A 01/31/2023   Procedure: RADIOACTIVE SEED IMPLANT/BRACHYTHERAPY IMPLANT;  Surgeon: Lahoma Pigg, MD;  Location: [facility];  Service: Urology;  Laterality: N/A;  90 MINUTES NEEDED FOR CASE   REMOVAL RETAINED LENS Left 03/01/2019   Procedure: REMOVAL OF RETAINED LENS MATERIALS LEFT EYE;  Surgeon: Ronelle Coffee, MD;  Location: [facility] OR;  Service: Ophthalmology;  Laterality: Left;   SPACE OAR INSTILLATION N/A 01/31/2023   Procedure: SPACE OAR INSTILLATION;  Surgeon: Lahoma Pigg, MD;  Location: [facility];  Service: Urology;  Laterality: N/A;   VASECTOMY  1981   WISDOM TOOTH EXTRACTION     Patient Active Problem List   Diagnosis Date Noted   Acute cystitis with hematuria 08/08/2023   Generalized weakness 08/08/2023   AKI (acute kidney injury) (HCC) 08/04/2023   Complicated UTI (urinary tract infection) 08/03/2023   History of herpes genitalis 09/16/2020   Cataracts,  bilateral 09/05/2018   Incomplete right bundle branch block (RBBB) 09/05/2018   Prostate cancer (HCC) 02/14/2014   Hypogonadism male 06/10/2011   PROSTATE SPECIFIC ANTIGEN, ELEVATED 07/13/2010   Hypothyroidism 05/27/2008   CARCINOMA, BASAL CELL, NOSE 10/05/2007   ERECTILE DYSFUNCTION, MILD 10/05/2007   LOSS, MIXED HEARING, BILATERAL 02/01/2007   BACK PAIN 02/01/2007   Hyperlipidemia 01/20/2007   Essential hypertension 01/20/2007    PCP: Marquetta Sit, MD   REFERRING PROVIDER: Marquetta Sit, MD  REFERRING DIAG: M62.89 (ICD-10-CM) - Pelvic floor instability Bladder spasms +1 more      THERAPY DIAG:  Cramp and spasm  Muscle weakness (generalized)  Abnormal posture  Pelvic pain  Rationale for Evaluation and Treatment:  Rehabilitation  ONSET DATE: 01/2024  SUBJECTIVE:                                                                                                                                                                                           SUBJECTIVE STATEMENT: Pt reports that he had radiation seeds implanted in his prostate in July 2024, since then he has been in chronic pelvic pain. They burned his bladder. Has had bladder spasms, he has pain when he urinates, it's like liquid fire. Uses self catheter to completely drain his bladder.  He has catheterized on and off since August 2024, in April foley came out.  He was supposed to do installations with heparin  and a form of lidocaine  to numb and heal his bladder 1-2/ week now. He has bladder spasms night and day, up all night. When he has bladder spasms, he has leakage and can't control that, using man pads. His perineum is hurting, cannot sit on hard surfaces Dr changed his medication, they are out of tricks. He is seeking second opinion and doing PT now.  Wants to control urination when he gets the spasms. Goal is to eliminate the pain.  The next step would be eliminating the bladder Pt has never had PT before.  When he has a bladder spasm at home, he rushes to the bathroom Starts on his own, when he starts on his own, it closes.  Tries to relax, can take 10-15 mins Gets a better flow when he relaxes Done with treatment for prostate cancer He is one percentile of people with this complication.  Leaks at night, wear extra protection overnight Meds drain him of energy, not active now, [facility] He is up all night going to the bathroom (Wife [name] present at eval)   Fluid intake: water , decaf coffee  PAIN:  Are you having pain? Yes NPRS  scale: up to 10 /10 Pain location: radiates to tip of penis, burns when he urinates  Pain type: burning Pain description: constant , without spasm less pain  Aggravating factors:  spasm, urination Relieving factors: not having spasm  PRECAUTIONS: None  RED FLAGS: Bowel or bladder incontinence: Yes:     WEIGHT BEARING RESTRICTIONS: No  FALLS:  Has patient fallen in last 6 months? No  LIVING ENVIRONMENT: Lives with: wifelives with their spouse Lives in: House/apartment Stairs: No Has following equipment at home: treatmillno  OCCUPATION: retired  PLOF: Independent  PATIENT GOALS: to be continent and less spasm  PERTINENT HISTORY:  Prostate cancer Low back ablations Sexual abuse: no  BOWEL MOVEMENT: medications- some make him constipated and some give him diarrhea  URINATION: Pain with urination: Yes Fully empty bladder: No Stream: Weak- trickle Urgency: Yes: - when he has a spasm Frequency: yes- late aftrenoon- very 15-20 mins Leakage: Urge to void and Walking to the bathroom Pads: Yes: 2-3/24 hrs  INTERCOURSE: has not tried- was told he would never be able to do at  OBJECTIVE:  Note: Objective measures were completed at Evaluation unless otherwise noted.    PATIENT SURVEYS:    PFIQ-7 70  COGNITION: Overall cognitive status: Within functional limits for tasks assessed     SENSATION: Light touch: Appears intact Proprioception: Appears intact  MUSCLE LENGTH: Hamstrings: Right 70 deg; Left 70 deg LUMBAR SPECIAL TESTS:  Straight leg raise test: Positive for HS tightness    POSTURE: rounded shoulders, forward head, increased thoracic kyphosis, and flexed trunk   PELVIC ALIGNMENT: even  LUMBARAROM/PROM: stiffness throughout other than flexion  A/PROM A/PROM  eval  Flexion 90%   LOWER EXTREMITY AROM/PROM: limited bilateral hip ER and IR and left hip flexion reproduces  pain into tip of penis   LOWER EXTREMITY MMT: 4/5 grossly throughout PALPATION: GENERAL abdominal trigger points and tightness reproduced tip of penis pain              External Perineal Exam- tight and tender bilateral bulbospongiosus               Internal Pelvic Floor mild tenderness throughout internal and external anal sphincter Patient confirms identification and approves PT to assess internal pelvic floor and treatment Yes  PELVIC MMT:   MMT eval  Internal Anal Sphincter 4/5  External Anal Sphincter 4/5  Puborectalis   Diastasis Recti 1 -1-1 finger, no doming  (Blank rows = not tested)  TONE: high  TODAY'S TREATMENT:                                                                                                                              DATE: 11/14/2023  EVAL see  below Trigger Point Dry Needling  Initial Treatment: Pt instructed on Dry Needling rational, procedures, and possible side effects. Pt instructed to expect mild to moderate muscle soreness later in the day and/or into the next day.  Pt instructed  in methods to reduce muscle soreness. Pt instructed to continue prescribed HEP. Patient was educated on signs and symptoms of infection and other risk factors and advised to seek medical attention should they occur.  Patient verbalized understanding of these instructions and education.   Patient Verbal Consent Given: Yes Education Handout Provided: Yes Muscles Treated: rectus abdominis Electrical Stimulation Performed: No Treatment Response/Outcome: less spasm and pain, no pain into tip of penis post needling   Manual- abdominal STM and dry needling , bilateral  bulbospongiosus light STM  PATIENT EDUCATION/ there acts  Education details: relevant anatomy, expectations of PT, HEP Person educated: Patient and Spouse Education method: Explanation, Demonstration, Tactile cues, Verbal cues, and Handouts Education comprehension: verbalized understanding, returned demonstration, verbal cues required, tactile cues required, and needs further education  HOME EXERCISE PROGRAM: Access Code: [email or web address] URL: [email or web address] Date: 11/14/2023 Prepared by: Jameson Mcburney Estuardo Frisbee  Exercises - Supine Diaphragmatic  Breathing  - 1 x daily - 7 x weekly - 3 sets - 10 reps - Seated Abdominal Press into [employer]  - 1 x daily - 7 x weekly - 3 sets - 10 reps - Seated Shoulder Diagonal Pulls with Resistance  - 1 x daily - 7 x weekly - 3 sets - 10 reps - Butterfly Groin Stretch  - 1 x daily - 7 x weekly - 3 sets - 10 reps - Supported Child's Pose with Diaphragmatic Breathing with Pelvic Floor Relaxation  - 1 x daily - 7 x weekly - 3 sets - 10 reps  ASSESSMENT:  CLINICAL IMPRESSION: Patient is a 78 y.o. M who was seen today for physical therapy evaluation and treatment for urinary incontinence, chronic bladder spasms and pain since he got treated with radiation seeds for prostate cancer last July (2024). Pt with a lot of stiffness throughout trunk and hips, weakness of core, difficulty with pelvic floor contraction ( did not reproduce symptoms) and with relaxation and diaphragmatic breathing. Abdominal trigger points reproduced tip of penis pain, and he had spasm in bilateral bulbospongiosus as well.  Pt with chronic low back pain, mild scoliosis, has constipation/ diarrhea depending on meds, poor energy and nocturia. His goals are to reduce spasms and pain, be continent again and sleep at night without pads. Pt did well with trial of bilateral rectus abdominis dry needing and manual therapy today. He will benefit from PT interventions to reduce bladder spasm and pain, improve urine stream and regain continence and function. Wife [name] present today,   OBJECTIVE IMPAIRMENTS: Abnormal gait, decreased activity tolerance, decreased coordination, decreased endurance, difficulty walking, decreased ROM, decreased strength, increased fascial restrictions, increased muscle spasms, impaired UE functional use, and pain.   ACTIVITY LIMITATIONS: continence and toileting  PARTICIPATION LIMITATIONS: interpersonal relationship and community activity  PERSONAL FACTORS: Age, Fitness, and Time since onset of injury/illness/exacerbation  are also affecting patient's functional outcome.   REHAB POTENTIAL: Good  CLINICAL DECISION MAKING: Evolving/moderate complexity  EVALUATION COMPLEXITY: Moderate   GOALS: Goals reviewed with patient? Yes  SHORT TERM GOALS: Target date: 12/12/2023    Pt will be independent with HEP.   Baseline: Goal status: INITIAL  2. Pt will be independent with the knack, urge suppression technique, and double voiding in order to improve bladder habits and decrease urinary incontinence.   Baseline:  Goal status: INITIAL  3.  Pt will be independent with use of squatty potty, relaxed toileting mechanics, and improved bowel movement techniques in order to increase ease of bowel movements and complete evacuation.   Baseline:  Goal status: INITIAL  4.  Pt will demonstrate at least 45 mins of exercise without increased back and pelvic pain Baseline:  Goal status: INITIAL    LONG TERM GOALS: Target date: 02/06/2024    Pt will be independent with advanced HEP.   Baseline:  Goal status: INITIAL  2.  Pt will report max 2/10 pelvic pain in order to have better quality of life Baseline:  Goal status: INITIAL  3.  Pt will soak 0 pads/ day in order to participate in community and church without embarassement Baseline: 2-3 Goal status: INITIAL  4.  Pt will report 0 bladder spasms in 24 hrs  Baseline:  Goal status: INITIAL  5.  Pt will get up 1 time/ night to urinate in order to have increased energy during the day Baseline:  Goal status: INITIAL    PLAN:  PT FREQUENCY: 1-2x/week  PT DURATION: 12 weeks  PLANNED INTERVENTIONS: 97110-Therapeutic exercises, 97530- Therapeutic activity, 97112- Neuromuscular re-education, 97535- Self Care, 40102- Manual therapy, 97032- Electrical stimulation (manual), Taping, Dry Needling, Joint mobilization, Joint manipulation, Spinal manipulation, Spinal mobilization, Scar mobilization, Cryotherapy, Moist heat, and Biofeedback  PLAN FOR NEXT SESSION: urge  supression, TTNS, manual and dry needling to bulbo and abdomen, strengthen for continence when urgency better   Gabreal Worton, PT 11/14/2023, 6:16 PM

## 2023-11-14 ENCOUNTER — Encounter: Payer: Self-pay | Admitting: Physical Therapy

## 2023-11-14 ENCOUNTER — Other Ambulatory Visit: Payer: Self-pay

## 2023-11-14 ENCOUNTER — Ambulatory Visit: Attending: Family Medicine | Admitting: Physical Therapy

## 2023-11-14 DIAGNOSIS — M6289 Other specified disorders of muscle: Secondary | ICD-10-CM | POA: Diagnosis not present

## 2023-11-14 DIAGNOSIS — R102 Pelvic and perineal pain: Secondary | ICD-10-CM | POA: Insufficient documentation

## 2023-11-14 DIAGNOSIS — R293 Abnormal posture: Secondary | ICD-10-CM | POA: Insufficient documentation

## 2023-11-14 DIAGNOSIS — M6281 Muscle weakness (generalized): Secondary | ICD-10-CM | POA: Insufficient documentation

## 2023-11-14 DIAGNOSIS — R252 Cramp and spasm: Secondary | ICD-10-CM | POA: Insufficient documentation

## 2023-11-18 ENCOUNTER — Ambulatory Visit: Admitting: Physical Therapy

## 2023-11-18 ENCOUNTER — Encounter: Payer: Self-pay | Admitting: Physical Therapy

## 2023-11-18 DIAGNOSIS — R102 Pelvic and perineal pain: Secondary | ICD-10-CM | POA: Diagnosis not present

## 2023-11-18 DIAGNOSIS — R252 Cramp and spasm: Secondary | ICD-10-CM

## 2023-11-18 DIAGNOSIS — M6289 Other specified disorders of muscle: Secondary | ICD-10-CM | POA: Diagnosis not present

## 2023-11-18 DIAGNOSIS — R293 Abnormal posture: Secondary | ICD-10-CM | POA: Diagnosis not present

## 2023-11-18 DIAGNOSIS — M6281 Muscle weakness (generalized): Secondary | ICD-10-CM | POA: Diagnosis not present

## 2023-11-18 NOTE — Therapy (Signed)
 OUTPATIENT PHYSICAL THERAPY MALE PELVIC TREATMENT   Patient Name: Ryan [name] MRN: [id number] DOB:05-07-1946, 78 y.o., male Today's Date: 11/18/2023  END OF SESSION:  PT End of Session - 11/18/23 1019     Visit Number 2    Date for PT Re-Evaluation 02/14/24    Authorization Type MEDICARE PART A AND B    PT Start Time 1015    PT Stop Time 1102    PT Time Calculation (min) 47 min    Activity Tolerance Patient tolerated treatment well;Patient limited by fatigue;Patient limited by pain    Behavior During Therapy [facility] for tasks assessed/performed             Past Medical History:  Diagnosis Date   Arthritis 03/28/2013   Back pain    Cancer (HCC) 2014   prostate cancer   Cataracts, bilateral    Dysrhythmia    occasional PAC- asymptomatic, per MD pt cut back on caffeine   ED (erectile dysfunction) 12/18/2021   due to arterial insufficency   Fungal nail infection    GERD (gastroesophageal reflux disease)    Hearing loss    wear hearing aids   HSV infection 2016   Hyperlipidemia    Hypertension    no meds, dx yrs ago per patient but normal last several yrs   Hypertensive retinopathy    OU   Hypothyroidism    Personal history of other diseases of the digestive system    Personal history of other diseases of the nervous system and sense organs 2014   Personal history of other endocrine, nutritional and metabolic disease 4098   Pneumonia    x 1   Primary testicular hypogonadism 2017   Retarded ejaculation 2021   Sleep apnea 2014   history of, did not qualify for cpap   Past Surgical History:  Procedure Laterality Date   ankle surgery trauma Right    CATARACT EXTRACTION Left 11/29/2018   Dr. Gennie [name]   COLONOSCOPY     COSMETIC SURGERY     loose skin removed around neck area   CYSTOSCOPY N/A 01/31/2023   Procedure: CYSTOSCOPY;  Surgeon: Ryan Pigg, MD;  Location: [facility];  Service: Urology;  Laterality: N/A;   eye lid surgery     EYE  SURGERY     fungal nail     LASIK Bilateral    PARS PLANA VITRECTOMY Left 03/01/2019   Procedure: PARS PLANA VITRECTOMY WITH 25 GAUGE;  Surgeon: Ryan Coffee, MD;  Location: [facility] OR;  Service: Ophthalmology;  Laterality: Left;   PROSTATE BIOPSY  06/21/2022   2020, 2018   RADIOACTIVE SEED IMPLANT N/A 01/31/2023   Procedure: RADIOACTIVE SEED IMPLANT/BRACHYTHERAPY IMPLANT;  Surgeon: Ryan Pigg, MD;  Location: [facility];  Service: Urology;  Laterality: N/A;  90 MINUTES NEEDED FOR CASE   REMOVAL RETAINED LENS Left 03/01/2019   Procedure: REMOVAL OF RETAINED LENS MATERIALS LEFT EYE;  Surgeon: Ryan Coffee, MD;  Location: [facility];  Service: Ophthalmology;  Laterality: Left;   SPACE OAR INSTILLATION N/A 01/31/2023   Procedure: SPACE OAR INSTILLATION;  Surgeon: Ryan Pigg, MD;  Location: [facility];  Service: Urology;  Laterality: N/A;   VASECTOMY  1981   WISDOM TOOTH EXTRACTION     Patient Active Problem List   Diagnosis Date Noted   Acute cystitis with hematuria 08/08/2023   Generalized weakness 08/08/2023   AKI (acute kidney injury) (HCC) 08/04/2023   Complicated UTI (urinary tract infection) 08/03/2023  History of herpes genitalis 09/16/2020   Cataracts, bilateral 09/05/2018   Incomplete right bundle branch block (RBBB) 09/05/2018   Prostate cancer (HCC) 02/14/2014   Hypogonadism male 06/10/2011   PROSTATE SPECIFIC ANTIGEN, ELEVATED 07/13/2010   Hypothyroidism 05/27/2008   CARCINOMA, BASAL CELL, NOSE 10/05/2007   ERECTILE DYSFUNCTION, MILD 10/05/2007   LOSS, MIXED HEARING, BILATERAL 02/01/2007   BACK PAIN 02/01/2007   Hyperlipidemia 01/20/2007   Essential hypertension 01/20/2007    PCP: Ryan Sit, MD   REFERRING PROVIDER: Marquetta Sit, MD  REFERRING DIAG: M62.89 (ICD-10-CM) - Pelvic floor instability Bladder spasms +1 more      THERAPY DIAG:  Cramp and spasm  Muscle weakness (generalized)  Abnormal posture  Pelvic  pain  Rationale for Evaluation and Treatment: Rehabilitation  ONSET DATE: 01/2024  SUBJECTIVE:                                                                                                                                                                                           SUBJECTIVE STATEMENT: I have been having increased burning with urination and comes with bladder spasms. I have had this after the initial evaluation.  (Wife Ryan [name] present at eval)   Fluid intake: water , decaf [name]  PAIN:  Are you having pain? Yes NPRS scale: up to 10 /10 11/18/23; pain level 6/10 Pain location: radiates to tip of penis, burns when he urinates  Pain type: burning Pain description: constant , without spasm less pain  Aggravating factors: spasm, urination Relieving factors: not having spasm  PRECAUTIONS: None  RED FLAGS: Bowel or bladder incontinence: Yes:     WEIGHT BEARING RESTRICTIONS: No  FALLS:  Has patient fallen in last 6 months? No  LIVING ENVIRONMENT: Lives with: wifelives with their spouse Lives in: House/apartment Stairs: No Has following equipment at home: treatmillno  OCCUPATION: retired  PLOF: Independent  PATIENT GOALS: to be continent and less spasm  PERTINENT HISTORY:  Prostate cancer Low back ablations Sexual abuse: no  BOWEL MOVEMENT: medications- some make him constipated and some give him diarrhea  URINATION: Pain with urination: Yes Fully empty bladder: No Stream: Weak- trickle Urgency: Yes: - when he has a spasm Frequency: yes- late aftrenoon- very 15-20 mins Leakage: Urge to void and Walking to the bathroom Pads: Yes: 2-3/24 hrs  INTERCOURSE: has not tried- was told he would never be able to do at  OBJECTIVE:  Note: Objective measures were completed at Evaluation unless otherwise noted.    PATIENT SURVEYS:    [id number]  COGNITION: Overall cognitive status: Within functional limits for tasks assessed     SENSATION: Light  touch:  Appears intact Proprioception: Appears intact  MUSCLE LENGTH: Hamstrings: Right 70 deg; Left 70 deg LUMBAR SPECIAL TESTS:  Straight leg raise test: Positive for HS tightness    POSTURE: rounded shoulders, forward head, increased thoracic kyphosis, and flexed trunk   PELVIC ALIGNMENT: even  LUMBARAROM/PROM: stiffness throughout other than flexion  A/PROM A/PROM  eval  Flexion 90%   LOWER EXTREMITY AROM/PROM: limited bilateral hip ER and IR and left hip flexion reproduces  pain into tip of penis   LOWER EXTREMITY MMT: 4/5 grossly throughout PALPATION: GENERAL abdominal trigger points and tightness reproduced tip of penis pain              External Perineal Exam- tight and tender bilateral bulbospongiosus              Internal Pelvic Floor mild tenderness throughout internal and external anal sphincter Patient confirms identification and approves PT to assess internal pelvic floor and treatment Yes  PELVIC MMT:   MMT eval  Internal Anal Sphincter 4/5  External Anal Sphincter 4/5  Puborectalis   Diastasis Recti 1 -1-1 finger, no doming  (Blank rows = not tested)  TONE: high  TODAY'S TREATMENT:      11/18/2023 Manual: Soft tissue mobilization: Manual work to the diaphragm and abdominal muscles in supine and laying on side to elongate tissue Myofascial release: Laying on left side performing fascial release along the area of the bladder, ureters to indirectly release around them to reduce bladder pain Mobilization: Mobilization of the lateral rib cage with breath to improve expansion and contraction Neuromuscular re-education: Form correction: Open book with therapist guiding opening of the upper rib cage and having him breath outward with abdominal contraction and stretching the upper back 10 x each way Supine breathing out with therapist guiding the anterior upper rib cage downward Down training: Diaphragmatic breathing with weight on abdomin to expand the  pelvic floor and tactile cues to lower rib cage to open up Sitting with diaphragmatic breathing with tactile cues to relax shoulders and feel the pelvic floor relax into the mat Therapeutic activities: Functional strengthening activities: [name] to stand with upright posture and not using his hands Sitting on commode with diaphragmatic breathing and not reducing the distance between the rib cage and pubic bone, how to push the urine out Educated patient on standing with distance between the rib cage and pubic bone Self-care: Educated patient on using pelvic floor meditation to assist with management of his pelvic pain and sent him a you tube video by Vickii Grand                                                                                                                            DATE: 11/14/2023  EVAL see  below Trigger Point Dry Needling  Initial Treatment: Pt instructed on Dry Needling rational, procedures, and possible side effects. Pt instructed to expect mild to moderate muscle soreness later in the day and/or into the next day.  Pt instructed in methods to reduce muscle soreness. Pt instructed to continue prescribed HEP. Patient was educated on signs and symptoms of infection and other risk factors and advised to seek medical attention should they occur.  Patient verbalized understanding of these instructions and education.   Patient Verbal Consent Given: Yes Education Handout Provided: Yes Muscles Treated: rectus abdominis Electrical Stimulation Performed: No Treatment Response/Outcome: less spasm and pain, no pain into tip of penis post needling   Manual- abdominal STM and dry needling , bilateral  bulbospongiosus light STM   PATIENT EDUCATION/ there acts  11/18/23 Education details:  Access Code: [email or web address], educated on how to relax pelvic floor to urinate, [name] to stand with upright posture Person educated: Patient and Spouse Education method: Explanation, Demonstration,  Tactile cues, Verbal cues, and Handouts Education comprehension: verbalized understanding, returned demonstration, verbal cues required, tactile cues required, and needs further education  HOME EXERCISE PROGRAM: 11/18/23 Access Code: [email or web address] URL: [email or web address] Date: 11/18/2023 Prepared by: Marsha Skeen  Exercises - Supine Diaphragmatic Breathing  - 1 x daily - 7 x weekly - 3 sets - 10 reps - Seated Abdominal Press into [employer]  - 1 x daily - 7 x weekly - 3 sets - 10 reps - Seated Shoulder Diagonal Pulls with Resistance  - 1 x daily - 7 x weekly - 3 sets - 10 reps - Butterfly Groin Stretch  - 1 x daily - 7 x weekly - 3 sets - 10 reps - Supported Child's Pose with Diaphragmatic Breathing with Pelvic Floor Relaxation  - 1 x daily - 7 x weekly - 3 sets - 10 reps - Sidelying Thoracic Rotation with Open Book  - 1 x daily - 7 x weekly - 1 sets - 10 reps  ASSESSMENT:  CLINICAL IMPRESSION: Patient is a 78 y.o. M who was seen today for physical therapy  treatment for urinary incontinence, chronic bladder spasms and pain since he got treated with radiation seeds for prostate cancer last July (2024). Patient was able to feel the pelvic floor relax in supine and sitting by the end of the treatment. He was able to stand taller by end of treatment with elongation of rib cage and anterior trunk. His pain level decreased from 6/10 to 1/10 by the end of the treatment. He has contacted his doctor to be checked for UTI. He will benefit from PT interventions to reduce bladder spasm and pain, improve urine stream and regain continence and function. Wife Ryan [name] present today,   OBJECTIVE IMPAIRMENTS: Abnormal gait, decreased activity tolerance, decreased coordination, decreased endurance, difficulty walking, decreased ROM, decreased strength, increased fascial restrictions, increased muscle spasms, impaired UE functional use, and pain.   ACTIVITY LIMITATIONS: continence and  toileting  PARTICIPATION LIMITATIONS: interpersonal relationship and community activity  PERSONAL FACTORS: Age, Fitness, and Time since onset of injury/illness/exacerbation are also affecting patient's functional outcome.   REHAB POTENTIAL: Good  CLINICAL DECISION MAKING: Evolving/moderate complexity  EVALUATION COMPLEXITY: Moderate   GOALS: Goals reviewed with patient? Yes  SHORT TERM GOALS: Target date: 12/12/2023    Pt will be independent with HEP.   Baseline: Goal status: INITIAL  2. Pt will be independent with the knack, urge suppression technique, and double voiding in order to improve bladder habits and decrease urinary incontinence.   Baseline:  Goal status: INITIAL  3.  Pt will be independent with use of squatty potty, relaxed toileting mechanics, and improved bowel movement techniques in order to increase ease of bowel movements and complete evacuation.  Baseline:  Goal status: INITIAL  4.  Pt will demonstrate at least 45 mins of exercise without increased back and pelvic pain Baseline:  Goal status: INITIAL    LONG TERM GOALS: Target date: 02/06/2024    Pt will be independent with advanced HEP.   Baseline:  Goal status: INITIAL  2.  Pt will report max 2/10 pelvic pain in order to have better quality of life Baseline:  Goal status: INITIAL  3.  Pt will soak 0 pads/ day in order to participate in community and church without embarassement Baseline: 2-3 Goal status: INITIAL  4.  Pt will report 0 bladder spasms in 24 hrs  Baseline:  Goal status: INITIAL  5.  Pt will get up 1 time/ night to urinate in order to have increased energy during the day Baseline:  Goal status: INITIAL    PLAN:  PT FREQUENCY: 1-2x/week  PT DURATION: 12 weeks  PLANNED INTERVENTIONS: 97110-Therapeutic exercises, 97530- Therapeutic activity, 97112- Neuromuscular re-education, 97535- Self Care, 91478- Manual therapy, [phone]- Electrical stimulation (manual), Taping, Dry  Needling, Joint mobilization, Joint manipulation, Spinal manipulation, Spinal mobilization, Scar mobilization, Cryotherapy, Moist heat, and Biofeedback  PLAN FOR NEXT SESSION: urge supression, TTNS, manual and dry needling to bulbo and abdomen, strengthen for continence when urgency better; RUSI for pelvic floor drop; fascial release around the bladder and ureters  Marsha Skeen, PT 11/18/23 11:17 AM

## 2023-11-21 ENCOUNTER — Ambulatory Visit: Admitting: Physical Therapy

## 2023-11-21 DIAGNOSIS — R252 Cramp and spasm: Secondary | ICD-10-CM

## 2023-11-21 DIAGNOSIS — M6281 Muscle weakness (generalized): Secondary | ICD-10-CM

## 2023-11-21 DIAGNOSIS — R102 Pelvic and perineal pain: Secondary | ICD-10-CM | POA: Diagnosis not present

## 2023-11-21 DIAGNOSIS — R293 Abnormal posture: Secondary | ICD-10-CM | POA: Diagnosis not present

## 2023-11-21 DIAGNOSIS — M6289 Other specified disorders of muscle: Secondary | ICD-10-CM | POA: Diagnosis not present

## 2023-11-21 NOTE — Therapy (Addendum)
 OUTPATIENT PHYSICAL THERAPY MALE PELVIC TREATMENT   Patient Name: Ryan [name] MRN: [id number] DOB:1945-12-30, 78 y.o., male Today's Date: 11/21/2023  END OF SESSION:  PT End of Session - 11/21/23 0948     Visit Number 3    Date for PT Re-Evaluation 02/14/24    Authorization Type MEDICARE PART A AND B    PT Start Time 0932    PT Stop Time 1015    PT Time Calculation (min) 43 min    Activity Tolerance Patient tolerated treatment well;Patient limited by fatigue;Patient limited by pain    Behavior During Therapy [facility] for tasks assessed/performed              Past Medical History:  Diagnosis Date   Arthritis 03/28/2013   Back pain    Cancer (HCC) 2014   prostate cancer   Cataracts, bilateral    Dysrhythmia    occasional PAC- asymptomatic, per MD pt cut back on caffeine   ED (erectile dysfunction) 12/18/2021   due to arterial insufficency   Fungal nail infection    GERD (gastroesophageal reflux disease)    Hearing loss    wear hearing aids   HSV infection 2016   Hyperlipidemia    Hypertension    no meds, dx yrs ago per patient but normal last several yrs   Hypertensive retinopathy    OU   Hypothyroidism    Personal history of other diseases of the digestive system    Personal history of other diseases of the nervous system and sense organs 2014   Personal history of other endocrine, nutritional and metabolic disease 4098   Pneumonia    x 1   Primary testicular hypogonadism 2017   Retarded ejaculation 2021   Sleep apnea 2014   history of, did not qualify for cpap   Past Surgical History:  Procedure Laterality Date   ankle surgery trauma Right    CATARACT EXTRACTION Left 11/29/2018   Dr. Gennie Kicks   COLONOSCOPY     COSMETIC SURGERY     loose skin removed around neck area   CYSTOSCOPY N/A 01/31/2023   Procedure: CYSTOSCOPY;  Surgeon: Lahoma Pigg, MD;  Location: [facility];  Service: Urology;  Laterality: N/A;   eye lid surgery     EYE  SURGERY     fungal nail     LASIK Bilateral    PARS PLANA VITRECTOMY Left 03/01/2019   Procedure: PARS PLANA VITRECTOMY WITH 25 GAUGE;  Surgeon: Ronelle Coffee, MD;  Location: [facility] OR;  Service: Ophthalmology;  Laterality: Left;   PROSTATE BIOPSY  06/21/2022   2020, 2018   RADIOACTIVE SEED IMPLANT N/A 01/31/2023   Procedure: RADIOACTIVE SEED IMPLANT/BRACHYTHERAPY IMPLANT;  Surgeon: Lahoma Pigg, MD;  Location: [facility];  Service: Urology;  Laterality: N/A;  90 MINUTES NEEDED FOR CASE   REMOVAL RETAINED LENS Left 03/01/2019   Procedure: REMOVAL OF RETAINED LENS MATERIALS LEFT EYE;  Surgeon: Ronelle Coffee, MD;  Location: [facility] OR;  Service: Ophthalmology;  Laterality: Left;   SPACE OAR INSTILLATION N/A 01/31/2023   Procedure: SPACE OAR INSTILLATION;  Surgeon: Lahoma Pigg, MD;  Location: [facility];  Service: Urology;  Laterality: N/A;   VASECTOMY  1981   WISDOM TOOTH EXTRACTION     Patient Active Problem List   Diagnosis Date Noted   Acute cystitis with hematuria 08/08/2023   Generalized weakness 08/08/2023   AKI (acute kidney injury) (HCC) 08/04/2023   Complicated UTI (urinary tract infection)  08/03/2023   History of herpes genitalis 09/16/2020   Cataracts, bilateral 09/05/2018   Incomplete right bundle branch block (RBBB) 09/05/2018   Prostate cancer (HCC) 02/14/2014   Hypogonadism male 06/10/2011   PROSTATE SPECIFIC ANTIGEN, ELEVATED 07/13/2010   Hypothyroidism 05/27/2008   CARCINOMA, BASAL CELL, NOSE 10/05/2007   ERECTILE DYSFUNCTION, MILD 10/05/2007   LOSS, MIXED HEARING, BILATERAL 02/01/2007   BACK PAIN 02/01/2007   Hyperlipidemia 01/20/2007   Essential hypertension 01/20/2007    PCP: Marquetta Sit, MD   REFERRING PROVIDER: Marquetta Sit, MD  REFERRING DIAG: M62.89 (ICD-10-CM) - Pelvic floor instability Bladder spasms +1 more      THERAPY DIAG:  Cramp and spasm  Abnormal posture  Pelvic pain  Muscle weakness  (generalized)  Rationale for Evaluation and Treatment: Rehabilitation  ONSET DATE: 01/2024  SUBJECTIVE:                                                                                                                                                                                           SUBJECTIVE STATEMENT: Pt reports that he felt good after her last appt, did not do anything over the weekend, did a few exercises this morning. Did open books and breathing Does not use the ball Soreness in abdomen Still has pain into the tip of penis Spasms more frequent in the afternoon and evening 25 minutes apart Taking Flomax - helps him urinate, but does not have control, when he does not take it, he cannot start urine flow  He did have spasms on Friday, had a really good day Feels constipated today  Fluid intake: water , decaf coffee  PAIN:  Are you having pain? Yes NPRS scale: up to 10 /10 11/18/23; pain level 6/10 Pain location: radiates to tip of penis, burns when he urinates  Pain type: burning Pain description: constant , without spasm less pain  Aggravating factors: spasm, urination Relieving factors: not having spasm  PRECAUTIONS: None  RED FLAGS: Bowel or bladder incontinence: Yes:     WEIGHT BEARING RESTRICTIONS: No  FALLS:  Has patient fallen in last 6 months? No  LIVING ENVIRONMENT: Lives with: wifelives with their spouse Lives in: House/apartment Stairs: No Has following equipment at home: treatmillno  OCCUPATION: retired  PLOF: Independent  PATIENT GOALS: to be continent and less spasm  PERTINENT HISTORY:  Prostate cancer Low back ablations Sexual abuse: no  BOWEL MOVEMENT: medications- some make him constipated and some give him diarrhea  URINATION: Pain with urination: Yes Fully empty bladder: No Stream: Weak- trickle Urgency: Yes: - when he has a spasm Frequency: yes- late aftrenoon- very 15-20 mins Leakage: Urge to void  and Walking to the  bathroom Pads: Yes: 2-3/24 hrs  INTERCOURSE: has not tried- was told he would never be able to do at  OBJECTIVE:  Note: Objective measures were completed at Evaluation unless otherwise noted.    PATIENT SURVEYS:    PFIQ-7 16  COGNITION: Overall cognitive status: Within functional limits for tasks assessed     SENSATION: Light touch: Appears intact Proprioception: Appears intact  MUSCLE LENGTH: Hamstrings: Right 70 deg; Left 70 deg LUMBAR SPECIAL TESTS:  Straight leg raise test: Positive for HS tightness    POSTURE: rounded shoulders, forward head, increased thoracic kyphosis, and flexed trunk   PELVIC ALIGNMENT: even  LUMBARAROM/PROM: stiffness throughout other than flexion  A/PROM A/PROM  eval  Flexion 90%   LOWER EXTREMITY AROM/PROM: limited bilateral hip ER and IR and left hip flexion reproduces  pain into tip of penis   LOWER EXTREMITY MMT: 4/5 grossly throughout PALPATION: GENERAL abdominal trigger points and tightness reproduced tip of penis pain              External Perineal Exam- tight and tender bilateral bulbospongiosus              Internal Pelvic Floor mild tenderness throughout internal and external anal sphincter Patient confirms identification and approves PT to assess internal pelvic floor and treatment Yes  PELVIC MMT:   MMT eval  Internal Anal Sphincter 4/5  External Anal Sphincter 4/5  Puborectalis   Diastasis Recti 1 -1-1 finger, no doming  (Blank rows = not tested)  TONE: high  TODAY'S TREATMENT:   11/21/2023 Manual- abdominal massage Neuro reed- diaphragmatic breathing with theraband                      Open books  There acts- urge suppression techniques      11/18/2023 Manual: Soft tissue mobilization: Manual work to the diaphragm and abdominal muscles in supine and laying on side to elongate tissue Myofascial release: Laying on left side performing fascial release along the area of the bladder, ureters to indirectly  release around them to reduce bladder pain Mobilization: Mobilization of the lateral rib cage with breath to improve expansion and contraction Neuromuscular re-education: Form correction: Open book with therapist guiding opening of the upper rib cage and having him breath outward with abdominal contraction and stretching the upper back 10 x each way Supine breathing out with therapist guiding the anterior upper rib cage downward Down training: Diaphragmatic breathing with weight on abdomin to expand the pelvic floor and tactile cues to lower rib cage to open up Sitting with diaphragmatic breathing with tactile cues to relax shoulders and feel the pelvic floor relax into the mat Therapeutic activities: Functional strengthening activities: Sit to stand with upright posture and not using his hands Sitting on commode with diaphragmatic breathing and not reducing the distance between the rib cage and pubic bone, how to push the urine out Educated patient on standing with distance between the rib cage and pubic bone Self-care: Educated patient on using pelvic floor meditation to assist with management of his pelvic pain and sent him a you tube video by [employer]  DATE: 11/14/2023  EVAL see  below Trigger Point Dry Needling  Initial Treatment: Pt instructed on Dry Needling rational, procedures, and possible side effects. Pt instructed to expect mild to moderate muscle soreness later in the day and/or into the next day.  Pt instructed in methods to reduce muscle soreness. Pt instructed to continue prescribed HEP. Patient was educated on signs and symptoms of infection and other risk factors and advised to seek medical attention should they occur.  Patient verbalized understanding of these instructions and education.   Patient Verbal Consent Given: Yes Education Handout  Provided: Yes Muscles Treated: rectus abdominis Electrical Stimulation Performed: No Treatment Response/Outcome: less spasm and pain, no pain into tip of penis post needling   Manual- abdominal STM and dry needling , bilateral  bulbospongiosus light STM   PATIENT EDUCATION/ there acts  11/18/23 Education details:  Access Code: [email or web address], educated on how to relax pelvic floor to urinate, sit to stand with upright posture Person educated: Patient and Spouse Education method: Explanation, Demonstration, Tactile cues, Verbal cues, and Handouts Education comprehension: verbalized understanding, returned demonstration, verbal cues required, tactile cues required, and needs further education  HOME EXERCISE PROGRAM: 11/18/23 Access Code: [email or web address] URL: [email or web address] Date: 11/18/2023 Prepared by: Marsha Skeen  Exercises - Supine Diaphragmatic Breathing  - 1 x daily - 7 x weekly - 3 sets - 10 reps - Seated Abdominal Press into [employer]  - 1 x daily - 7 x weekly - 3 sets - 10 reps - Seated Shoulder Diagonal Pulls with Resistance  - 1 x daily - 7 x weekly - 3 sets - 10 reps - Butterfly Groin Stretch  - 1 x daily - 7 x weekly - 3 sets - 10 reps - Supported Child's Pose with Diaphragmatic Breathing with Pelvic Floor Relaxation  - 1 x daily - 7 x weekly - 3 sets - 10 reps - Sidelying Thoracic Rotation with Open Book  - 1 x daily - 7 x weekly - 1 sets - 10 reps  ASSESSMENT:  CLINICAL IMPRESSION: Pt did well with abdominal massage, urge suppression techniques and his exercises. Able to stand taller and feel his diaphragmatic breath coordinated a little more with pelvic floor movement. Wife [name] present. He will continue to benefit from PT     OBJECTIVE IMPAIRMENTS: Abnormal gait, decreased activity tolerance, decreased coordination, decreased endurance, difficulty walking, decreased ROM, decreased strength, increased fascial restrictions, increased muscle spasms, impaired UE  functional use, and pain.   ACTIVITY LIMITATIONS: continence and toileting  PARTICIPATION LIMITATIONS: interpersonal relationship and community activity  PERSONAL FACTORS: Age, Fitness, and Time since onset of injury/illness/exacerbation are also affecting patient's functional outcome.   REHAB POTENTIAL: Good  CLINICAL DECISION MAKING: Evolving/moderate complexity  EVALUATION COMPLEXITY: Moderate   GOALS: Goals reviewed with patient? Yes  SHORT TERM GOALS: Target date: 12/12/2023    Pt will be independent with HEP.   Baseline: Goal status: INITIAL  2. Pt will be independent with the knack, urge suppression technique, and double voiding in order to improve bladder habits and decrease urinary incontinence.   Baseline:  Goal status: INITIAL  3.  Pt will be independent with use of squatty potty, relaxed toileting mechanics, and improved bowel movement techniques in order to increase ease of bowel movements and complete evacuation.   Baseline:  Goal status: INITIAL  4.  Pt will demonstrate at least 45 mins of exercise without increased back and pelvic pain Baseline:  Goal status: INITIAL    LONG TERM GOALS: Target  date: 02/06/2024    Pt will be independent with advanced HEP.   Baseline:  Goal status: INITIAL  2.  Pt will report max 2/10 pelvic pain in order to have better quality of life Baseline:  Goal status: INITIAL  3.  Pt will soak 0 pads/ day in order to participate in community and church without embarassement Baseline: 2-3 Goal status: INITIAL  4.  Pt will report 0 bladder spasms in 24 hrs  Baseline:  Goal status: INITIAL  5.  Pt will get up 1 time/ night to urinate in order to have increased energy during the day Baseline:  Goal status: INITIAL    PLAN:  PT FREQUENCY: 1-2x/week  PT DURATION: 12 weeks  PLANNED INTERVENTIONS: 97110-Therapeutic exercises, 97530- Therapeutic activity, 97112- Neuromuscular re-education, 97535- Self Care, 40981- Manual  therapy, [phone]- Electrical stimulation (manual), Taping, Dry Needling, Joint mobilization, Joint manipulation, Spinal manipulation, Spinal mobilization, Scar mobilization, Cryotherapy, Moist heat, and Biofeedback  PLAN FOR NEXT SESSION: urge supression, TTNS, manual and dry needling to bulbo and abdomen, strengthen for continence when urgency better; RUSI for pelvic floor drop; fascial release around the bladder and ureters  Dellie Piasecki, PT 11/21/23 4:19 PM

## 2023-11-22 ENCOUNTER — Ambulatory Visit (INDEPENDENT_AMBULATORY_CARE_PROVIDER_SITE_OTHER): Admitting: Family Medicine

## 2023-11-22 ENCOUNTER — Encounter: Payer: Self-pay | Admitting: Family Medicine

## 2023-11-22 VITALS — BP 138/68 | HR 73 | Temp 98.0°F | Wt 156.5 lb

## 2023-11-22 DIAGNOSIS — R300 Dysuria: Secondary | ICD-10-CM | POA: Diagnosis not present

## 2023-11-22 LAB — POC URINALSYSI DIPSTICK (AUTOMATED)
Bilirubin, UA: POSITIVE
Blood, UA: POSITIVE
Glucose, UA: NEGATIVE
Ketones, UA: POSITIVE
Nitrite, UA: POSITIVE
Protein, UA: POSITIVE — AB
Spec Grav, UA: 1.02 (ref 1.010–1.025)
Urobilinogen, UA: 4 U/dL — AB
pH, UA: 7 (ref 5.0–8.0)

## 2023-11-22 MED ORDER — CIPROFLOXACIN HCL 500 MG PO TABS: 500.0000 mg | ORAL_TABLET | Freq: Two times a day (BID) | ORAL | 0 refills | Status: AC

## 2023-11-22 NOTE — Progress Notes (Signed)
 Established Patient Office Visit  Subjective   Patient ID: Ryan [name], male    DOB: 1945/12/01  Age: 78 y.o. MRN: [id number]  Chief Complaint  Patient presents with   Dysuria    HPI   Ryan [name] is seen for concerns for possible UTI.  He has past medical history significant for hypertension, hypothyroidism, low testosterone , prostate cancer, hyperlipidemia.  Underwent radiation seed implants last year for prostate cancer.  Has had several complications since then.  Developed severely ulcerated bladder and ended up with suprapubic catheter placement months ago.  Had severe pain for several months.  History of previous complicated UTI with Pseudomonas.  Has had intermittent problems with bladder spasms.  Followed closely by urology.  Had to do some In-N-Out caths for incomplete emptying.  Was seen by his urologist on the fifth and had urine dipstick which suggested possible infection.  Culture grew out Serratia marcescens.  Patient was treated with Cipro  500 mg twice daily for 7 days at that time.  Interestingly, did not have any urinary symptoms at that point.  He now presents with about 6-day history of some burning with urination.  No fever.  No flank pain.  Occasional chills.  He was also recently started on Myrbetriq and they have concerns whether that could have contributed to current symptoms.  Had been on oxybutynin  but they were concerned about interaction with gabapentin  and he stopped the oxybutynin  was switched to Myrbetriq.  No nausea or vomiting.  Urine has been very darkly colored and they feel like there has been some gross hematuria.  Past Medical History:  Diagnosis Date   Arthritis 03/28/2013   Back pain    Cancer (HCC) 2014   prostate cancer   Cataracts, bilateral    Dysrhythmia    occasional PAC- asymptomatic, per MD pt cut back on caffeine   ED (erectile dysfunction) 12/18/2021   due to arterial insufficency   Fungal nail infection    GERD (gastroesophageal  reflux disease)    Hearing loss    wear hearing aids   HSV infection 2016   Hyperlipidemia    Hypertension    no meds, dx yrs ago per patient but normal last several yrs   Hypertensive retinopathy    OU   Hypothyroidism    Personal history of other diseases of the digestive system    Personal history of other diseases of the nervous system and sense organs 2014   Personal history of other endocrine, nutritional and metabolic disease 6295   Pneumonia    x 1   Primary testicular hypogonadism 2017   Retarded ejaculation 2021   Sleep apnea 2014   history of, did not qualify for cpap   Past Surgical History:  Procedure Laterality Date   ankle surgery trauma Right    CATARACT EXTRACTION Left 11/29/2018   Dr. Gennie Kicks   COLONOSCOPY     COSMETIC SURGERY     loose skin removed around neck area   CYSTOSCOPY N/A 01/31/2023   Procedure: CYSTOSCOPY;  Surgeon: Lahoma Pigg, MD;  Location: [facility];  Service: Urology;  Laterality: N/A;   eye lid surgery     EYE SURGERY     fungal nail     LASIK Bilateral    PARS PLANA VITRECTOMY Left 03/01/2019   Procedure: PARS PLANA VITRECTOMY WITH 25 GAUGE;  Surgeon: Ronelle Coffee, MD;  Location: [facility] OR;  Service: Ophthalmology;  Laterality: Left;   PROSTATE BIOPSY  06/21/2022   2020,  2018   RADIOACTIVE SEED IMPLANT N/A 01/31/2023   Procedure: RADIOACTIVE SEED IMPLANT/BRACHYTHERAPY IMPLANT;  Surgeon: Lahoma Pigg, MD;  Location: [facility];  Service: Urology;  Laterality: N/A;  90 MINUTES NEEDED FOR CASE   REMOVAL RETAINED LENS Left 03/01/2019   Procedure: REMOVAL OF RETAINED LENS MATERIALS LEFT EYE;  Surgeon: Ronelle Coffee, MD;  Location: [facility] OR;  Service: Ophthalmology;  Laterality: Left;   SPACE OAR INSTILLATION N/A 01/31/2023   Procedure: SPACE OAR INSTILLATION;  Surgeon: Lahoma Pigg, MD;  Location: [facility];  Service: Urology;  Laterality: N/A;   VASECTOMY  1981   WISDOM TOOTH EXTRACTION       reports that he quit smoking about 42 years ago. His smoking use included cigarettes. He has never used smokeless tobacco. He reports current alcohol  use of about 3.0 - 4.0 standard drinks of alcohol  per week. He reports that he does not use drugs. family history includes Alcohol  abuse in his father; Arthritis in his sister; COPD in his sister; Cancer in his father; Heart disease in his mother; Liver disease in his father. Allergies  Allergen Reactions   Bee Venom Anaphylaxis and Other (See Comments)    Drowsiness, too   Dust Mite Extract Itching and Other (See Comments)    Sneezing, runny nose, itchy eyes   Grass Extracts [Gramineae Pollens] Itching and Other (See Comments)    Sneezing, runny nose, itchy eyes    Review of Systems  Constitutional:  Negative for chills and fever.  Cardiovascular:  Negative for chest pain.  Gastrointestinal:  Negative for nausea and vomiting.  Genitourinary:  Positive for dysuria, frequency and hematuria. Negative for flank pain.      Objective:      BP 138/68 (BP Location: Left Arm, Patient Position: Sitting, Cuff Size: Normal)   Pulse 73   Temp 98 F (36.7 C) (Oral)   Wt 156 lb 8 oz (71 kg)   SpO2 97%   BMI 23.80 kg/m  BP Readings from Last 3 Encounters:  11/22/23 138/68  08/12/23 120/60  08/08/23 116/70   Wt Readings from Last 3 Encounters:  11/22/23 156 lb 8 oz (71 kg)  08/12/23 160 lb 11.2 oz (72.9 kg)  08/03/23 137 lb 6.4 oz (62.3 kg)      Physical Exam Vitals reviewed.  Constitutional:      General: He is not in acute distress.    Appearance: He is not ill-appearing or toxic-appearing.  Cardiovascular:     Rate and Rhythm: Normal rate and regular rhythm.  Pulmonary:     Effort: Pulmonary effort is normal.     Breath sounds: Normal breath sounds.  Neurological:     Mental Status: He is alert.      Results for orders placed or performed in visit on 11/22/23  POCT Urinalysis Dipstick (Automated)  Result Value Ref  Range   Color, UA Red    Clarity, UA Cloudy    Glucose, UA Negative Negative   Bilirubin, UA Positive    Ketones, UA Positive    Spec Grav, UA 1.020 1.010 - 1.025   Blood, UA Positive    pH, UA 7.0 5.0 - 8.0   Protein, UA Positive (A) Negative   Urobilinogen, UA 4.0 (A) 0.2 or 1.0 E.U./dL   Nitrite, UA Positive    Leukocytes, UA Large (3+) (A) Negative      The 10-year ASCVD risk score (Arnett DK, et al., 2019) is: 30.3%    Assessment &  Plan:   Problem List Items Addressed This Visit   None Visit Diagnoses       Dysuria    -  Primary   Relevant Orders   POCT Urinalysis Dipstick (Automated) (Completed)   Urine Culture     Ryan [name] presents with approximately 1 week history of burning with urination.  Recent positive culture May 5 for Serratia marcescens.  Just recently came off Cipro .  Currently afebrile.  Nontoxic in appearance.  Urine dipstick here today strongly suggest possible infection.  -Urine culture sent - Stay well-hydrated - Start back Cipro  500 mg twice daily for 10 days pending culture results - We did review recent urine culture and Serratia species was resistant to several antibiotics but not Cipro   No follow-ups on file.    Glean Lamy, MD

## 2023-11-23 LAB — URINE CULTURE
MICRO NUMBER:: 16478594
Result:: NO GROWTH
SPECIMEN QUALITY:: ADEQUATE

## 2023-11-24 ENCOUNTER — Ambulatory Visit: Payer: Self-pay | Admitting: Family Medicine

## 2023-12-01 ENCOUNTER — Encounter: Payer: Self-pay | Admitting: Physical Therapy

## 2023-12-01 ENCOUNTER — Ambulatory Visit: Admitting: Physical Therapy

## 2023-12-01 DIAGNOSIS — R102 Pelvic and perineal pain unspecified side: Secondary | ICD-10-CM

## 2023-12-01 DIAGNOSIS — M6281 Muscle weakness (generalized): Secondary | ICD-10-CM

## 2023-12-01 DIAGNOSIS — M6289 Other specified disorders of muscle: Secondary | ICD-10-CM | POA: Diagnosis not present

## 2023-12-01 DIAGNOSIS — R293 Abnormal posture: Secondary | ICD-10-CM

## 2023-12-01 DIAGNOSIS — R252 Cramp and spasm: Secondary | ICD-10-CM

## 2023-12-01 NOTE — Therapy (Addendum)
 OUTPATIENT PHYSICAL THERAPY MALE PELVIC TREATMENT   Patient Name: Ryan [name] MRN: [id number] DOB:06-Aug-1945, 78 y.o., male Today's Date: 12/01/2023  END OF SESSION:  PT End of Session - 12/01/23 1402     Visit Number 4    Date for PT Re-Evaluation 02/14/24    Authorization Type MEDICARE PART A AND B    PT Start Time 1400    PT Stop Time 1440    PT Time Calculation (min) 40 min    Activity Tolerance Patient tolerated treatment well;Patient limited by fatigue;Patient limited by pain    Behavior During Therapy [facility] for tasks assessed/performed               Past Medical History:  Diagnosis Date   Arthritis 03/28/2013   Back pain    Cancer (HCC) 2014   prostate cancer   Cataracts, bilateral    Dysrhythmia    occasional PAC- asymptomatic, per MD pt cut back on caffeine   ED (erectile dysfunction) 12/18/2021   due to arterial insufficency   Fungal nail infection    GERD (gastroesophageal reflux disease)    Hearing loss    wear hearing aids   HSV infection 2016   Hyperlipidemia    Hypertension    no meds, dx yrs ago per patient but normal last several yrs   Hypertensive retinopathy    OU   Hypothyroidism    Personal history of other diseases of the digestive system    Personal history of other diseases of the nervous system and sense organs 2014   Personal history of other endocrine, nutritional and metabolic disease 7829   Pneumonia    x 1   Primary testicular hypogonadism 2017   Retarded ejaculation 2021   Sleep apnea 2014   history of, did not qualify for cpap   Past Surgical History:  Procedure Laterality Date   ankle surgery trauma Right    CATARACT EXTRACTION Left 11/29/2018   Dr. Gennie Kicks   COLONOSCOPY     COSMETIC SURGERY     loose skin removed around neck area   CYSTOSCOPY N/A 01/31/2023   Procedure: CYSTOSCOPY;  Surgeon: Lahoma Pigg, MD;  Location[facility];  Service: Urology;  Laterality: N/A;   eye lid surgery     EYE  SURGERY     fungal nail     LASIK Bilateral    PARS PLANA VITRECTOMY Left 03/01/2019   Procedure: PARS PLANA VITRECTOMY WITH 25 GAUGE;  Surgeon: Ronelle Coffee, MD;  Location: [facility];  Service: Ophthalmology;  Laterality: Left;   PROSTATE BIOPSY  06/21/2022   2020, 2018   RADIOACTIVE SEED IMPLANT N/A 01/31/2023   Procedure: RADIOACTIVE SEED IMPLANT/BRACHYTHERAPY IMPLANT;  Surgeon: Lahoma Pigg, MD;  Location: [facility];  Service: Urology;  Laterality: N/A;  90 MINUTES NEEDED FOR CASE   REMOVAL RETAINED LENS Left 03/01/2019   Procedure: REMOVAL OF RETAINED LENS MATERIALS LEFT EYE;  Surgeon: Ronelle Coffee, MD;  Location: [facility] OR;  Service: Ophthalmology;  Laterality: Left;   SPACE OAR INSTILLATION N/A 01/31/2023   Procedure: SPACE OAR INSTILLATION;  Surgeon: Lahoma Pigg, MD;  Location: [facility];  Service: Urology;  Laterality: N/A;   VASECTOMY  1981   WISDOM TOOTH EXTRACTION     Patient Active Problem List   Diagnosis Date Noted   Acute cystitis with hematuria 08/08/2023   Generalized weakness 08/08/2023   AKI (acute kidney injury) (HCC) 08/04/2023   Complicated UTI (urinary tract  infection) 08/03/2023   History of herpes genitalis 09/16/2020   Cataracts, bilateral 09/05/2018   Incomplete right bundle branch block (RBBB) 09/05/2018   Prostate cancer (HCC) 02/14/2014   Hypogonadism male 06/10/2011   PROSTATE SPECIFIC ANTIGEN, ELEVATED 07/13/2010   Hypothyroidism 05/27/2008   CARCINOMA, BASAL CELL, NOSE 10/05/2007   ERECTILE DYSFUNCTION, MILD 10/05/2007   LOSS, MIXED HEARING, BILATERAL 02/01/2007   BACK PAIN 02/01/2007   Hyperlipidemia 01/20/2007   Essential hypertension 01/20/2007    PCP: Marquetta Sit, MD   REFERRING PROVIDER: Marquetta Sit, MD  REFERRING DIAG: M62.89 (ICD-10-CM) - Pelvic floor instability Bladder spasms +1 more      THERAPY DIAG:  Cramp and spasm  Muscle weakness (generalized)  Abnormal posture  Pelvic  pain  Rationale for Evaluation and Treatment: Rehabilitation  ONSET DATE: 01/2024  SUBJECTIVE:                                                                                                                                                                                           SUBJECTIVE STATEMENT: Pt reports that he is tired and has not been able to do his exercises.  Bladder spasms, usually when he washes his hands. Feels like urgency and he leaks.  He is on an antibiotic for his UTI He is feeling under the weather, spends his days in bed, up at night a lot.  The only thing he has done is his breathing. He had blood in his urine last week.  No blood in urine now Pt reports that he has a theory - tip of the catheter irritated the wall when he was catheterizing himself, could have caused the beeding  Fluid intake: water , decaf coffee  PAIN:  Are you having pain? Yes NPRS scale: up to 10 /10 11/18/23; pain level 6/10 Pain location: radiates to tip of penis, burns when he urinates  Pain type: burning Pain description: constant , without spasm less pain  Aggravating factors: spasm, urination Relieving factors: not having spasm  PRECAUTIONS: None  RED FLAGS: Bowel or bladder incontinence: Yes:     WEIGHT BEARING RESTRICTIONS: No  FALLS:  Has patient fallen in last 6 months? No  LIVING ENVIRONMENT: Lives with: wifelives with their spouse Lives in: House/apartment Stairs: No Has following equipment at home: treatmillno  OCCUPATION: retired  PLOF: Independent  PATIENT GOALS: to be continent and less spasm  PERTINENT HISTORY:  Prostate cancer Low back ablations Sexual abuse: no  BOWEL MOVEMENT: medications- some make him constipated and some give him diarrhea  URINATION: Pain with urination: Yes Fully empty bladder: No Stream: Weak- trickle Urgency: Yes: - when he  has a spasm Frequency: yes- late aftrenoon- very 15-20 mins Leakage: Urge to void and Walking  to the bathroom Pads: Yes: 2-3/24 hrs  INTERCOURSE: has not tried- was told he would never be able to do at  OBJECTIVE:  Note: Objective measures were completed at Evaluation unless otherwise noted.    PATIENT SURVEYS:    [id number]  COGNITION: Overall cognitive status: Within functional limits for tasks assessed     SENSATION: Light touch: Appears intact Proprioception: Appears intact  MUSCLE LENGTH: Hamstrings: Right 70 deg; Left 70 deg LUMBAR SPECIAL TESTS:  Straight leg raise test: Positive for HS tightness    POSTURE: rounded shoulders, forward head, increased thoracic kyphosis, and flexed trunk   PELVIC ALIGNMENT: even  LUMBARAROM/PROM: stiffness throughout other than flexion  A/PROM A/PROM  eval  Flexion 90%   LOWER EXTREMITY AROM/PROM: limited bilateral hip ER and IR and left hip flexion reproduces  pain into tip of penis   LOWER EXTREMITY MMT: 4/5 grossly throughout PALPATION: GENERAL abdominal trigger points and tightness reproduced tip of penis pain              External Perineal Exam- tight and tender bilateral bulbospongiosus              Internal Pelvic Floor mild tenderness throughout internal and external anal sphincter Patient confirms identification and approves PT to assess internal pelvic floor and treatment Yes  PELVIC MMT:   MMT eval  Internal Anal Sphincter 4/5  External Anal Sphincter 4/5  Puborectalis   Diastasis Recti 1 -1-1 finger, no doming  (Blank rows = not tested)  TONE: high  TODAY'S TREATMENT:  12/01/2023 There acts- Trial of transcutaneous tibial nerve stimulation for urinary urgency. Pt educated on electrode placement near medial malleoli bilateral feet and electrodes placed. Tens unit turned up until strong and comfortable sensation was felt in bilateral feet and sensation of toes curling and then turned down. Current ran for 20 mins. Pt was educated on turning off unit, electrode cleaning and storing and protocol was  given for self care at home. Pt to set up unit per protocol and reach out if help is needed with adjusting parameters to 200 HZ and 20 ns.                        Pt was educated on urge suppression techniques      There ex- open books for improved Th mobility and breathing  Diaphragmatic breathing with theraband tied around lower ribs to facilitate rib expansion                            11/21/2023 Manual- abdominal massage Neuro reed- diaphragmatic breathing with theraband                      Open books  There acts- urge suppression techniques      11/18/2023 Manual: Soft tissue mobilization: Manual work to the diaphragm and abdominal muscles in supine and laying on side to elongate tissue Myofascial release: Laying on left side performing fascial release along the area of the bladder, ureters to indirectly release around them to reduce bladder pain Mobilization: Mobilization of the lateral rib cage with breath to improve expansion and contraction Neuromuscular re-education: Form correction: Open book with therapist guiding opening of the upper rib cage and having him breath outward with abdominal contraction and stretching the upper back 10 x  each way Supine breathing out with therapist guiding the anterior upper rib cage downward Down training: Diaphragmatic breathing with weight on abdomin to expand the pelvic floor and tactile cues to lower rib cage to open up Sitting with diaphragmatic breathing with tactile cues to relax shoulders and feel the pelvic floor relax into the mat Therapeutic activities: Functional strengthening activities: Sit to stand with upright posture and not using his hands Sitting on commode with diaphragmatic breathing and not reducing the distance between the rib cage and pubic bone, how to push the urine out Educated patient on standing with distance between the rib cage and pubic bone Self-care: Educated patient on using pelvic floor meditation to  assist with management of his pelvic pain and sent him a you tube video by Vickii Grand                                                                                                                            DATE: 11/14/2023  EVAL see  below Trigger Point Dry Needling  Initial Treatment: Pt instructed on Dry Needling rational, procedures, and possible side effects. Pt instructed to expect mild to moderate muscle soreness later in the day and/or into the next day.  Pt instructed in methods to reduce muscle soreness. Pt instructed to continue prescribed HEP. Patient was educated on signs and symptoms of infection and other risk factors and advised to seek medical attention should they occur.  Patient verbalized understanding of these instructions and education.   Patient Verbal Consent Given: Yes Education Handout Provided: Yes Muscles Treated: rectus abdominis Electrical Stimulation Performed: No Treatment Response/Outcome: less spasm and pain, no pain into tip of penis post needling   Manual- abdominal STM and dry needling , bilateral  bulbospongiosus light STM   PATIENT EDUCATION/ there acts  11/18/23 Education details:  Access Code: [email or web address], educated on how to relax pelvic floor to urinate, sit to stand with upright posture Person educated: Patient and Spouse Education method: Explanation, Demonstration, Tactile cues, Verbal cues, and Handouts Education comprehension: verbalized understanding, returned demonstration, verbal cues required, tactile cues required, and needs further education  HOME EXERCISE PROGRAM: 11/18/23 Access Code: [email or web address] URL: [email or web address] Date: 11/18/2023 Prepared by: Marsha Skeen  Exercises - Supine Diaphragmatic Breathing  - 1 x daily - 7 x weekly - 3 sets - 10 reps - Seated Abdominal Press into [employer]  - 1 x daily - 7 x weekly - 3 sets - 10 reps - Seated Shoulder Diagonal Pulls with Resistance  - 1 x daily - 7 x weekly - 3  sets - 10 reps - Butterfly Groin Stretch  - 1 x daily - 7 x weekly - 3 sets - 10 reps - Supported Child's Pose with Diaphragmatic Breathing with Pelvic Floor Relaxation  - 1 x daily - 7 x weekly - 3 sets - 10 reps - Sidelying Thoracic Rotation with Open Book  - 1 x daily - 7 x weekly - 1  sets - 10 reps  ASSESSMENT:  CLINICAL IMPRESSION: Pt did fairly well with his exercises d/t fatigue and UTI. Educated him on transcutaneous tibial nerve stimulation bilateral feet for 20 mins for urgency and bladder spasm. Had bladder spasm at the end of session when putting shoes and socks on. Reported that bladder was filling up. Bladder spasms more in the aftrenoon when he is more active and drinks more water . Bladder medicine help.  Wife [name] present. He will continue to benefit from PT     OBJECTIVE IMPAIRMENTS: Abnormal gait, decreased activity tolerance, decreased coordination, decreased endurance, difficulty walking, decreased ROM, decreased strength, increased fascial restrictions, increased muscle spasms, impaired UE functional use, and pain.   ACTIVITY LIMITATIONS: continence and toileting  PARTICIPATION LIMITATIONS: interpersonal relationship and community activity  PERSONAL FACTORS: Age, Fitness, and Time since onset of injury/illness/exacerbation are also affecting patient's functional outcome.   REHAB POTENTIAL: Good  CLINICAL DECISION MAKING: Evolving/moderate complexity  EVALUATION COMPLEXITY: Moderate   GOALS: Goals reviewed with patient? Yes  SHORT TERM GOALS: Target date: 12/12/2023    Pt will be independent with HEP.   Baseline: Goal status: INITIAL  2. Pt will be independent with the knack, urge suppression technique, and double voiding in order to improve bladder habits and decrease urinary incontinence.   Baseline:  Goal status: INITIAL  3.  Pt will be independent with use of squatty potty, relaxed toileting mechanics, and improved bowel movement techniques in order to  increase ease of bowel movements and complete evacuation.   Baseline:  Goal status: INITIAL  4.  Pt will demonstrate at least 45 mins of exercise without increased back and pelvic pain Baseline:  Goal status: INITIAL    LONG TERM GOALS: Target date: 02/06/2024    Pt will be independent with advanced HEP.   Baseline:  Goal status: INITIAL  2.  Pt will report max 2/10 pelvic pain in order to have better quality of life Baseline:  Goal status: INITIAL  3.  Pt will soak 0 pads/ day in order to participate in community and church without embarassement Baseline: 2-3 Goal status: INITIAL  4.  Pt will report 0 bladder spasms in 24 hrs  Baseline:  Goal status: INITIAL  5.  Pt will get up 1 time/ night to urinate in order to have increased energy during the day Baseline:  Goal status: INITIAL    PLAN:  PT FREQUENCY: 1-2x/week  PT DURATION: 12 weeks  PLANNED INTERVENTIONS: 97110-Therapeutic exercises, 97530- Therapeutic activity, 97112- Neuromuscular re-education, 97535- Self Care, 16109- Manual therapy, [id number]- Electrical stimulation (manual), Taping, Dry Needling, Joint mobilization, Joint manipulation, Spinal manipulation, Spinal mobilization, Scar mobilization, Cryotherapy, Moist heat, and Biofeedback  PLAN FOR NEXT SESSION: urge supression, TTNS, manual and dry needling to bulbo and abdomen, strengthen for continence when urgency better; RUSI for pelvic floor drop; fascial release around the bladder and ureters  Jakie Debow, PT 12/01/23 2:32 PM

## 2023-12-09 ENCOUNTER — Ambulatory Visit: Payer: Self-pay | Attending: Family Medicine | Admitting: Physical Therapy

## 2023-12-09 ENCOUNTER — Encounter: Payer: Self-pay | Admitting: Physical Therapy

## 2023-12-09 DIAGNOSIS — M6281 Muscle weakness (generalized): Secondary | ICD-10-CM | POA: Insufficient documentation

## 2023-12-09 DIAGNOSIS — R102 Pelvic and perineal pain: Secondary | ICD-10-CM | POA: Insufficient documentation

## 2023-12-09 DIAGNOSIS — R252 Cramp and spasm: Secondary | ICD-10-CM | POA: Insufficient documentation

## 2023-12-09 DIAGNOSIS — R293 Abnormal posture: Secondary | ICD-10-CM | POA: Insufficient documentation

## 2023-12-09 NOTE — Therapy (Signed)
 OUTPATIENT PHYSICAL THERAPY MALE PELVIC TREATMENT   Patient Name: Ryan [name] MRN: [id number] DOB:Jan 21, 1946, 78 y.o., male Today's Date: 12/09/2023  END OF SESSION:  PT End of Session - 12/09/23 0846     Visit Number 5    Date for PT Re-Evaluation 02/14/24    Authorization Type MEDICARE PART A AND B    Authorization - Visit Number 5    Authorization - Number of Visits 10    PT Start Time 0845    PT Stop Time 0925    PT Time Calculation (min) 40 min    Activity Tolerance Patient tolerated treatment well;Patient limited by fatigue;Patient limited by pain    Behavior During Therapy [facility] for tasks assessed/performed               Past Medical History:  Diagnosis Date   Arthritis 03/28/2013   Back pain    Cancer (HCC) 2014   prostate cancer   Cataracts, bilateral    Dysrhythmia    occasional PAC- asymptomatic, per MD pt cut back on caffeine   ED (erectile dysfunction) 12/18/2021   due to arterial insufficency   Fungal nail infection    GERD (gastroesophageal reflux disease)    Hearing loss    wear hearing aids   HSV infection 2016   Hyperlipidemia    Hypertension    no meds, dx yrs ago per patient but normal last several yrs   Hypertensive retinopathy    OU   Hypothyroidism    Personal history of other diseases of the digestive system    Personal history of other diseases of the nervous system and sense organs 2014   Personal history of other endocrine, nutritional and metabolic disease 7829   Pneumonia    x 1   Primary testicular hypogonadism 2017   Retarded ejaculation 2021   Sleep apnea 2014   history of, did not qualify for cpap   Past Surgical History:  Procedure Laterality Date   ankle surgery trauma Right    CATARACT EXTRACTION Left 11/29/2018   Dr. Gennie Kicks   COLONOSCOPY     COSMETIC SURGERY     loose skin removed around neck area   CYSTOSCOPY N/A 01/31/2023   Procedure: CYSTOSCOPY;  Surgeon: Lahoma Pigg, MD;  Location: [facility];  Service: Urology;  Laterality: N/A;   eye lid surgery     EYE SURGERY     fungal nail     LASIK Bilateral    PARS PLANA VITRECTOMY Left 03/01/2019   Procedure: PARS PLANA VITRECTOMY WITH 25 GAUGE;  Surgeon: Ronelle Coffee, MD;  Location: [facility] OR;  Service: Ophthalmology;  Laterality: Left;   PROSTATE BIOPSY  06/21/2022   2020, 2018   RADIOACTIVE SEED IMPLANT N/A 01/31/2023   Procedure: RADIOACTIVE SEED IMPLANT/BRACHYTHERAPY IMPLANT;  Surgeon: Lahoma Pigg, MD;  Location: [facility];  Service: Urology;  Laterality: N/A;  90 MINUTES NEEDED FOR CASE   REMOVAL RETAINED LENS Left 03/01/2019   Procedure: REMOVAL OF RETAINED LENS MATERIALS LEFT EYE;  Surgeon: Ronelle Coffee, MD;  Location: [facility] OR;  Service: Ophthalmology;  Laterality: Left;   SPACE OAR INSTILLATION N/A 01/31/2023   Procedure: SPACE OAR INSTILLATION;  Surgeon: Lahoma Pigg, MD;  Location: [facility];  Service: Urology;  Laterality: N/A;   VASECTOMY  1981   WISDOM TOOTH EXTRACTION     Patient Active Problem List   Diagnosis Date Noted   Acute cystitis with hematuria 08/08/2023  Generalized weakness 08/08/2023   AKI (acute kidney injury) (HCC) 08/04/2023   Complicated UTI (urinary tract infection) 08/03/2023   History of herpes genitalis 09/16/2020   Cataracts, bilateral 09/05/2018   Incomplete right bundle branch block (RBBB) 09/05/2018   Prostate cancer (HCC) 02/14/2014   Hypogonadism male 06/10/2011   PROSTATE SPECIFIC ANTIGEN, ELEVATED 07/13/2010   Hypothyroidism 05/27/2008   CARCINOMA, BASAL CELL, NOSE 10/05/2007   ERECTILE DYSFUNCTION, MILD 10/05/2007   LOSS, MIXED HEARING, BILATERAL 02/01/2007   BACK PAIN 02/01/2007   Hyperlipidemia 01/20/2007   Essential hypertension 01/20/2007    PCP: Marquetta Sit, MD   REFERRING PROVIDER: Marquetta Sit, MD  REFERRING DIAG: M62.89 (ICD-10-CM) - Pelvic floor instability Bladder spasms +1 more      THERAPY DIAG:   Cramp and spasm  Muscle weakness (generalized)  Abnormal posture  Pelvic pain  Rationale for Evaluation and Treatment: Rehabilitation  ONSET DATE: 01/2024  SUBJECTIVE:                                                                                                                                                                                           SUBJECTIVE STATEMENT: Bladder spasms are reducing and not as intense. I have ordered the TENS unit. I used the TENS at night and slept through the night but was not able to urinate in the morning so I had to self catheter. Last night I got up 2 times. This morning I had a slow trickle so I catheter.   PAIN:  Are you having pain? Yes NPRS scale: up to 10 /10 11/18/23; pain level 6/10 12/09/23: pain level 6-7/10  Pain location: radiates to tip of penis, burns when he urinates Pain type: burning Pain description: constant , without spasm less pain  Aggravating factors: spasm, urination Relieving factors: not having spasm  PRECAUTIONS: None  RED FLAGS: Bowel or bladder incontinence: Yes:     WEIGHT BEARING RESTRICTIONS: No  FALLS:  Has patient fallen in last 6 months? No  LIVING ENVIRONMENT: Lives with: wifelives with their spouse Lives in: House/apartment Stairs: No Has following equipment at home: treatmillno  OCCUPATION: retired  PLOF: Independent  PATIENT GOALS: to be continent and less spasm  PERTINENT HISTORY:  Prostate cancer Low back ablations Sexual abuse: no  BOWEL MOVEMENT: medications- some make him constipated and some give him diarrhea  URINATION: Pain with urination: Yes Fully empty bladder: No Stream: Weak- trickle Urgency: Yes: - when he has a spasm Frequency: yes- late aftrenoon- very 15-20 mins Leakage: Urge to void and Walking to the bathroom Pads: Yes: 2-3/24 hrs  INTERCOURSE: has not tried- was told he  would never be able to do at  OBJECTIVE:  Note: Objective measures were completed  at Evaluation unless otherwise noted.  Pudendal nerve goes to the penis  PATIENT SURVEYS:    PFIQ-7 [id number]  COGNITION: Overall cognitive status: Within functional limits for tasks assessed     SENSATION: Light touch: Appears intact Proprioception: Appears intact  MUSCLE LENGTH: Hamstrings: Right 70 deg; Left 70 deg LUMBAR SPECIAL TESTS:  Straight leg raise test: Positive for HS tightness    POSTURE: rounded shoulders, forward head, increased thoracic kyphosis, and flexed trunk   PELVIC ALIGNMENT: even  LUMBARAROM/PROM: stiffness throughout other than flexion  A/PROM A/PROM  eval  Flexion 90%   LOWER EXTREMITY AROM/PROM: limited bilateral hip ER and IR and left hip flexion reproduces  pain into tip of penis   LOWER EXTREMITY MMT: 4/5 grossly throughout PALPATION: GENERAL abdominal trigger points and tightness reproduced tip of penis pain              External Perineal Exam- tight and tender bilateral bulbospongiosus              Internal Pelvic Floor mild tenderness throughout internal and external anal sphincter Patient confirms identification and approves PT to assess internal pelvic floor and treatment Yes  PELVIC MMT:   MMT eval  Internal Anal Sphincter 4/5  External Anal Sphincter 4/5  Puborectalis   Diastasis Recti 1 -1-1 finger, no doming  (Blank rows = not tested)  TONE: high  TODAY'S TREATMENT:  12/09/23 Manual: Internal pelvic floor techniques: No emotional/communication barriers or cognitive limitation. Patient is motivated to learn. Patient understands and agrees with treatment goals and plan. PT explains patient will be examined in standing, sitting, and lying down to see how their muscles and joints work. When they are ready, they will be asked to remove their underwear so PT can examine their perineum. The patient is also given the option of providing their own chaperone as one is not provided in our facility. The patient also has the right and is  explained the right to defer or refuse any part of the evaluation or treatment including the internal exam. With the patient's consent, PT will use one gloved finger to gently assess the muscles of the pelvic floor, seeing how well it contracts and relaxes and if there is muscle symmetry. After, the patient will get dressed and PT and patient will discuss exam findings and plan of care. PT and patient discuss plan of care, schedule, attendance policy and HEP activities.  Using a gloved finger going into the rectum working on the anococcygeal, levator ani, bulbocavernosus, ischiocavernosus, obturator internist, coccyx distraction, puboretalis to lengthen the muscles and reduce bladder spasms Using gloved finger in the rectum and distraction of the penis to elongate the urethra and mobilize the nerves to reduce the burning  Neuromuscular re-education: Down training: Diaphragmatic breathing with therapist feeling the pelvic floor drop  Diaphragmatic breathing then breathing out to push therapist finger out of the rectum Exercises: Stretches/mobility: Open book 10 x on each side to open the chest.  Self-care: Educated patient on the pelvic floor muscles, how elongation of the muscles can help with trigger points and release around the pudendal nerve to reduce burning in the penis Educated patient on working on masturbation to improve blood flow and elongation of tissue Educated patient on walking program to work on endurance    12/01/2023 There acts- trial of TTNS- 20 mins bilateral feet  Urge suppression techniques     There ex- open books  Diaphragmatic breathing with theraband                            11/21/2023 Manual- abdominal massage Neuro reed- diaphragmatic breathing with theraband                      Open books  There acts- urge suppression techniques      11/18/2023 Manual: Soft tissue mobilization: Manual work to the diaphragm and abdominal muscles in  supine and laying on side to elongate tissue Myofascial release: Laying on left side performing fascial release along the area of the bladder, ureters to indirectly release around them to reduce bladder pain Mobilization: Mobilization of the lateral rib cage with breath to improve expansion and contraction Neuromuscular re-education: Form correction: Open book with therapist guiding opening of the upper rib cage and having him breath outward with abdominal contraction and stretching the upper back 10 x each way Supine breathing out with therapist guiding the anterior upper rib cage downward Down training: Diaphragmatic breathing with weight on abdomin to expand the pelvic floor and tactile cues to lower rib cage to open up Sitting with diaphragmatic breathing with tactile cues to relax shoulders and feel the pelvic floor relax into the mat Therapeutic activities: Functional strengthening activities: Sit to stand with upright posture and not using his hands Sitting on commode with diaphragmatic breathing and not reducing the distance between the rib cage and pubic bone, how to push the urine out Educated patient on standing with distance between the rib cage and pubic bone Self-care: Educated patient on using pelvic floor meditation to assist with management of his pelvic pain and sent him a you tube video by Vickii Grand                                                                                                                            DATE: 11/14/2023  EVAL see  below Trigger Point Dry Needling  Initial Treatment: Pt instructed on Dry Needling rational, procedures, and possible side effects. Pt instructed to expect mild to moderate muscle soreness later in the day and/or into the next day.  Pt instructed in methods to reduce muscle soreness. Pt instructed to continue prescribed HEP. Patient was educated on signs and symptoms of infection and other risk factors and advised to seek  medical attention should they occur.  Patient verbalized understanding of these instructions and education.   Patient Verbal Consent Given: Yes Education Handout Provided: Yes Muscles Treated: rectus abdominis Electrical Stimulation Performed: No Treatment Response/Outcome: less spasm and pain, no pain into tip of penis post needling   Manual- abdominal STM and dry needling , bilateral  bulbospongiosus light STM   PATIENT EDUCATION/ there acts  11/18/23 Education details:  Access Code: [email or web address], educated on how to relax pelvic floor to urinate, sit to stand  with upright posture Person educated: Patient and Spouse Education method: Explanation, Demonstration, Tactile cues, Verbal cues, and Handouts Education comprehension: verbalized understanding, returned demonstration, verbal cues required, tactile cues required, and needs further education  HOME EXERCISE PROGRAM: 11/18/23 Access Code: [email or web address] URL: [email or web address] Date: 11/18/2023 Prepared by: Marsha Skeen  Exercises - Supine Diaphragmatic Breathing  - 1 x daily - 7 x weekly - 3 sets - 10 reps - Seated Abdominal Press into [employer]  - 1 x daily - 7 x weekly - 3 sets - 10 reps - Seated Shoulder Diagonal Pulls with Resistance  - 1 x daily - 7 x weekly - 3 sets - 10 reps - Butterfly Groin Stretch  - 1 x daily - 7 x weekly - 3 sets - 10 reps - Supported Child's Pose with Diaphragmatic Breathing with Pelvic Floor Relaxation  - 1 x daily - 7 x weekly - 3 sets - 10 reps - Sidelying Thoracic Rotation with Open Book  - 1 x daily - 7 x weekly - 1 sets - 10 reps  ASSESSMENT:  CLINICAL IMPRESSION: Pt did fairly well with his exercises d/t fatigue and UTI. Educated him on transcutaneous tibial nerve stimulation bilateral feet for 20 mins for urgency and bladder spasm. Had bladder spasm at the end of session when putting shoes and socks on. Reported that bladder was filling up. Patient was able to do a pelvic floor drop.  He has difficulty with pushing the therapist finger out of the rectum. He is now taking 1 gabapentin  compared to 3. Wife Ryan [name] present. He will continue to benefit from PT to work on pelvic floor relaxation, coordination, reduction of bladder spasms and improve quality of life.      OBJECTIVE IMPAIRMENTS: Abnormal gait, decreased activity tolerance, decreased coordination, decreased endurance, difficulty walking, decreased ROM, decreased strength, increased fascial restrictions, increased muscle spasms, impaired UE functional use, and pain.   ACTIVITY LIMITATIONS: continence and toileting  PARTICIPATION LIMITATIONS: interpersonal relationship and community activity  PERSONAL FACTORS: Age, Fitness, and Time since onset of injury/illness/exacerbation are also affecting patient's functional outcome.   REHAB POTENTIAL: Good  CLINICAL DECISION MAKING: Evolving/moderate complexity  EVALUATION COMPLEXITY: Moderate   GOALS: Goals reviewed with patient? Yes  SHORT TERM GOALS: Target date: 12/12/2023    Pt will be independent with HEP.   Baseline: Goal status: INITIAL  2. Pt will be independent with the knack, urge suppression technique, and double voiding in order to improve bladder habits and decrease urinary incontinence.   Baseline:  Goal status: INITIAL  3.  Pt will be independent with use of squatty potty, relaxed toileting mechanics, and improved bowel movement techniques in order to increase ease of bowel movements and complete evacuation.   Baseline:  Goal status: INITIAL  4.  Pt will demonstrate at least 45 mins of exercise without increased back and pelvic pain Baseline:  Goal status: INITIAL    LONG TERM GOALS: Target date: 02/06/2024    Pt will be independent with advanced HEP.   Baseline:  Goal status: INITIAL  2.  Pt will report max 2/10 pelvic pain in order to have better quality of life Baseline:  Goal status: INITIAL  3.  Pt will soak 0 pads/ day in order to  participate in community and church without embarassement Baseline: 2-3 Goal status: INITIAL  4.  Pt will report 0 bladder spasms in 24 hrs  Baseline:  Goal status: INITIAL  5.  Pt will get up 1 time/ night to urinate  in order to have increased energy during the day Baseline:  Goal status: INITIAL    PLAN:  PT FREQUENCY: 1-2x/week  PT DURATION: 12 weeks  PLANNED INTERVENTIONS: 97110-Therapeutic exercises, 97530- Therapeutic activity, 97112- Neuromuscular re-education, 97535- Self Care, 40981- Manual therapy, [phone]- Electrical stimulation (manual), Taping, Dry Needling, Joint mobilization, Joint manipulation, Spinal manipulation, Spinal mobilization, Scar mobilization, Cryotherapy, Moist heat, and Biofeedback  PLAN FOR NEXT SESSION:  TTNS, manual and dry needling to bulbo and abdomen, strengthen for continence when urgency better; RUSI for pelvic floor drop; fascial release around the bladder and ureters; internal work  Marsha Skeen, PT 12/09/23 9:34 AM

## 2023-12-12 ENCOUNTER — Ambulatory Visit: Admitting: Physical Therapy

## 2023-12-12 ENCOUNTER — Encounter: Payer: Self-pay | Admitting: Physical Therapy

## 2023-12-12 DIAGNOSIS — R102 Pelvic and perineal pain: Secondary | ICD-10-CM

## 2023-12-12 DIAGNOSIS — R293 Abnormal posture: Secondary | ICD-10-CM

## 2023-12-12 DIAGNOSIS — R252 Cramp and spasm: Secondary | ICD-10-CM | POA: Diagnosis not present

## 2023-12-12 DIAGNOSIS — M6281 Muscle weakness (generalized): Secondary | ICD-10-CM

## 2023-12-12 NOTE — Therapy (Signed)
 OUTPATIENT PHYSICAL THERAPY MALE PELVIC TREATMENT   Patient Name: Ryan [name] MRN: [id number] DOB:09-29-1945, 78 y.o., male Today's Date: 12/12/2023  END OF SESSION:  PT End of Session - 12/12/23 1020     Visit Number 6    Date for PT Re-Evaluation 02/14/24    Authorization Type MEDICARE PART A AND B    Authorization - Visit Number 6    Authorization - Number of Visits 10    PT Start Time 1015    PT Stop Time 1055    PT Time Calculation (min) 40 min    Activity Tolerance Patient tolerated treatment well    Behavior During Therapy WFL for tasks assessed/performed               Past Medical History:  Diagnosis Date   Arthritis 03/28/2013   Back pain    Cancer (HCC) 2014   prostate cancer   Cataracts, bilateral    Dysrhythmia    occasional PAC- asymptomatic, per MD pt cut back on caffeine   ED (erectile dysfunction) 12/18/2021   due to arterial insufficency   Fungal nail infection    GERD (gastroesophageal reflux disease)    Hearing loss    wear hearing aids   HSV infection 2016   Hyperlipidemia    Hypertension    no meds, dx yrs ago per patient but normal last several yrs   Hypertensive retinopathy    OU   Hypothyroidism    Personal history of other diseases of the digestive system    Personal history of other diseases of the nervous system and sense organs 2014   Personal history of other endocrine, nutritional and metabolic disease 4098   Pneumonia    x 1   Primary testicular hypogonadism 2017   Retarded ejaculation 2021   Sleep apnea 2014   history of, did not qualify for cpap   Past Surgical History:  Procedure Laterality Date   ankle surgery trauma Right    CATARACT EXTRACTION Left 11/29/2018   Dr. Gennie Kicks   COLONOSCOPY     COSMETIC SURGERY     loose skin removed around neck area   CYSTOSCOPY N/A 01/31/2023   Procedure: CYSTOSCOPY;  Surgeon: Lahoma Pigg, MD;  Location: [facility];  Service: Urology;  Laterality: N/A;    eye lid surgery     EYE SURGERY     fungal nail     LASIK Bilateral    PARS PLANA VITRECTOMY Left 03/01/2019   Procedure: PARS PLANA VITRECTOMY WITH 25 GAUGE;  Surgeon: Ronelle Coffee, MD;  Location: [facility];  Service: Ophthalmology;  Laterality: Left;   PROSTATE BIOPSY  06/21/2022   2020, 2018   RADIOACTIVE SEED IMPLANT N/A 01/31/2023   Procedure: RADIOACTIVE SEED IMPLANT/BRACHYTHERAPY IMPLANT;  Surgeon: Lahoma Pigg, MD;  Location: [facility];  Service: Urology;  Laterality: N/A;  90 MINUTES NEEDED FOR CASE   REMOVAL RETAINED LENS Left 03/01/2019   Procedure: REMOVAL OF RETAINED LENS MATERIALS LEFT EYE;  Surgeon: Ronelle Coffee, MD;  Location: [facility] OR;  Service: Ophthalmology;  Laterality: Left;   SPACE OAR INSTILLATION N/A 01/31/2023   Procedure: SPACE OAR INSTILLATION;  Surgeon: Lahoma Pigg, MD;  Location: [facility];  Service: Urology;  Laterality: N/A;   VASECTOMY  1981   WISDOM TOOTH EXTRACTION     Patient Active Problem List   Diagnosis Date Noted   Acute cystitis with hematuria 08/08/2023   Generalized weakness 08/08/2023   AKI (  acute kidney injury) (HCC) 08/04/2023   Complicated UTI (urinary tract infection) 08/03/2023   History of herpes genitalis 09/16/2020   Cataracts, bilateral 09/05/2018   Incomplete right bundle branch block (RBBB) 09/05/2018   Prostate cancer (HCC) 02/14/2014   Hypogonadism male 06/10/2011   PROSTATE SPECIFIC ANTIGEN, ELEVATED 07/13/2010   Hypothyroidism 05/27/2008   CARCINOMA, BASAL CELL, NOSE 10/05/2007   ERECTILE DYSFUNCTION, MILD 10/05/2007   LOSS, MIXED HEARING, BILATERAL 02/01/2007   BACK PAIN 02/01/2007   Hyperlipidemia 01/20/2007   Essential hypertension 01/20/2007    PCP: Marquetta Sit, MD   REFERRING PROVIDER: Marquetta Sit, MD  REFERRING DIAG: M62.89 (ICD-10-CM) - Pelvic floor instability Bladder spasms +1 more   THERAPY DIAG:  Cramp and spasm  Muscle weakness  (generalized)  Abnormal posture  Pelvic pain  Rationale for Evaluation and Treatment: Rehabilitation  ONSET DATE: 01/2024  SUBJECTIVE:                                                                                                                                                                                           SUBJECTIVE STATEMENT: I still need to take the Flomax . I had less spasm. I went to the park to walk. I see the urologist tomorrow. Still has the burning with urination. Urinary leakage happens with bladder spasms. Bladder spasms are reduced. I had 5 spasm since Friday and I leaked 2 times.   PAIN:  Are you having pain? Yes NPRS scale: up to 10 /10 11/18/23; pain level 6/10 12/09/23: pain level 6-7/10  Pain location: radiates to tip of penis, burns when he urinates Pain type: burning Pain description: constant , without spasm less pain  Aggravating factors: spasm, urination Relieving factors: not having spasm  PRECAUTIONS: None  RED FLAGS: Bowel or bladder incontinence: Yes:     WEIGHT BEARING RESTRICTIONS: No  FALLS:  Has patient fallen in last 6 months? No  LIVING ENVIRONMENT: Lives with: wifelives with their spouse Lives in: House/apartment Stairs: No Has following equipment at home: treatmillno  OCCUPATION: retired  PLOF: Independent  PATIENT GOALS: to be continent and less spasm  PERTINENT HISTORY:  Prostate cancer Low back ablations Sexual abuse: no  BOWEL MOVEMENT: medications- some make him constipated and some give him diarrhea  URINATION: Pain with urination: Yes Fully empty bladder: No Stream: Weak- trickle Urgency: Yes: - when he has a spasm Frequency: yes- late aftrenoon- very 15-20 mins 12/12/23: can wait 1 to 1.5 hours to urinate.  Leakage: Urge to void and Walking to the bathroom Pads: Yes: 2-3/24 hrs 12/12/23: 1 pad during the day and 1 at night.   INTERCOURSE: has  not tried- was told he would never be able to do  at  OBJECTIVE:  Note: Objective measures were completed at Evaluation unless otherwise noted.  Pudendal nerve goes to the penis  PATIENT SURVEYS:  PFIQ-7 [id number]  COGNITION: Overall cognitive status: Within functional limits for tasks assessed     SENSATION: Light touch: Appears intact Proprioception: Appears intact  MUSCLE LENGTH: Hamstrings: Right 70 deg; Left 70 deg LUMBAR SPECIAL TESTS:  Straight leg raise test: Positive for HS tightness    POSTURE: rounded shoulders, forward head, increased thoracic kyphosis, and flexed trunk   PELVIC ALIGNMENT: even  LUMBARAROM/PROM: stiffness throughout other than flexion  A/PROM A/PROM  eval  Flexion 90%   LOWER EXTREMITY AROM/PROM: limited bilateral hip ER and IR and left hip flexion reproduces  pain into tip of penis   LOWER EXTREMITY MMT: 4/5 grossly throughout PALPATION: GENERAL abdominal trigger points and tightness reproduced tip of penis pain              External Perineal Exam- tight and tender bilateral bulbospongiosus              Internal Pelvic Floor mild tenderness throughout internal and external anal sphincter Patient confirms identification and approves PT to assess internal pelvic floor and treatment Yes  PELVIC MMT:   MMT eval 12/12/23  Internal Anal Sphincter 4/5 5/5  External Anal Sphincter 4/5 5/5  Puborectalis  5/5  Diastasis Recti 1 -1-1 finger, no doming   (Blank rows = not tested)  TONE: high  TODAY'S TREATMENT:  12/12/23:  Manual: Internal pelvic floor techniques: No emotional/communication barriers or cognitive limitation. Patient is motivated to learn. Patient understands and agrees with treatment goals and plan. PT explains patient will be examined in standing, sitting, and lying down to see how their muscles and joints work. When they are ready, they will be asked to remove their underwear so PT can examine their perineum. The patient is also given the option of providing their own chaperone  as one is not provided in our facility. The patient also has the right and is explained the right to defer or refuse any part of the evaluation or treatment including the internal exam. With the patient's consent, PT will use one gloved finger to gently assess the muscles of the pelvic floor, seeing how well it contracts and relaxes and if there is muscle symmetry. After, the patient will get dressed and PT and patient will discuss exam findings and plan of care. PT and patient discuss plan of care, schedule, attendance policy and HEP activities.  Using a gloved finger going into the rectum working on the anococcygeal, levator ani, bulbocavernosus, ischiocavernosus, obturator internist, coccyx distraction, puboretalis to lengthen the muscles and reduce bladder spasms Using gloved finger in the rectum and distraction of the penis to elongate the urethra and mobilize the nerves to reduce the burning  Exercises: Stretches/mobility: Hamstring stretch in sitting holding 30 sec 2 x each Piriformis stretch in sitting holding 30 sec 2 x each    12/09/23 Manual: Internal pelvic floor techniques: No emotional/communication barriers or cognitive limitation. Patient is motivated to learn. Patient understands and agrees with treatment goals and plan. PT explains patient will be examined in standing, sitting, and lying down to see how their muscles and joints work. When they are ready, they will be asked to remove their underwear so PT can examine their perineum. The patient is also given the option of providing their own chaperone as one is not provided  in our facility. The patient also has the right and is explained the right to defer or refuse any part of the evaluation or treatment including the internal exam. With the patient's consent, PT will use one gloved finger to gently assess the muscles of the pelvic floor, seeing how well it contracts and relaxes and if there is muscle symmetry. After, the patient will get  dressed and PT and patient will discuss exam findings and plan of care. PT and patient discuss plan of care, schedule, attendance policy and HEP activities.  Using a gloved finger going into the rectum working on the anococcygeal, levator ani, bulbocavernosus, ischiocavernosus, obturator internist, coccyx distraction, puboretalis to lengthen the muscles and reduce bladder spasms Using gloved finger in the rectum and distraction of the penis to elongate the urethra and mobilize the nerves to reduce the burning  Neuromuscular re-education: Down training: Diaphragmatic breathing with therapist feeling the pelvic floor drop  Diaphragmatic breathing then breathing out to push therapist finger out of the rectum Exercises: Stretches/mobility: Open book 10 x on each side to open the chest.  Self-care: Educated patient on the pelvic floor muscles, how elongation of the muscles can help with trigger points and release around the pudendal nerve to reduce burning in the penis Educated patient on working on masturbation to improve blood flow and elongation of tissue Educated patient on walking program to work on endurance    12/01/2023 There acts- trial of TTNS- 20 mins bilateral feet                     Urge suppression techniques     There ex- open books  Diaphragmatic breathing with theraband                            11/21/2023 Manual- abdominal massage Neuro reed- diaphragmatic breathing with theraband                      Open books  There acts- urge suppression techniques      11/18/2023 Manual: Soft tissue mobilization: Manual work to the diaphragm and abdominal muscles in supine and laying on side to elongate tissue Myofascial release: Laying on left side performing fascial release along the area of the bladder, ureters to indirectly release around them to reduce bladder pain Mobilization: Mobilization of the lateral rib cage with breath to improve expansion and  contraction Neuromuscular re-education: Form correction: Open book with therapist guiding opening of the upper rib cage and having him breath outward with abdominal contraction and stretching the upper back 10 x each way Supine breathing out with therapist guiding the anterior upper rib cage downward Down training: Diaphragmatic breathing with weight on abdomin to expand the pelvic floor and tactile cues to lower rib cage to open up Sitting with diaphragmatic breathing with tactile cues to relax shoulders and feel the pelvic floor relax into the mat Therapeutic activities: Functional strengthening activities: Sit to stand with upright posture and not using his hands Sitting on commode with diaphragmatic breathing and not reducing the distance between the rib cage and pubic bone, how to push the urine out Educated patient on standing with distance between the rib cage and pubic bone Self-care: Educated patient on using pelvic floor meditation to assist with management of his pelvic pain and sent him a you tube video by Vickii Grand     PATIENT EDUCATION/ there acts  12/12/23 Education details:  Access Code: [email or web address], educated on how to relax pelvic floor to urinate, sit to stand with upright posture Person educated: Patient and Spouse Education method: Explanation, Demonstration, Tactile cues, Verbal cues, and Handouts Education comprehension: verbalized understanding, returned demonstration, verbal cues required, tactile cues required, and needs further education  HOME EXERCISE PROGRAM: 12/12/23 Access Code: [email or web address] URL: [email or web address] Date: 12/12/2023 Prepared by: Marsha Skeen  Exercises - Supine Diaphragmatic Breathing  - 1 x daily - 7 x weekly - 3 sets - 10 reps - Seated Abdominal Press into [employer]  - 1 x daily - 7 x weekly - 1 sets - 10 reps - Seated Shoulder Diagonal Pulls with Resistance  - 1 x daily - 7 x weekly - 3 sets - 10 reps - Sidelying Thoracic  Rotation with Open Book  - 1 x daily - 7 x weekly - 1 sets - 10 reps - Seated Piriformis Stretch with Trunk Bend  - 1 x daily - 7 x weekly - 1 sets - 2 reps - 30 sec hold - Seated Hamstring Stretch  - 1 x daily - 7 x weekly - 1 sets - 2 reps - 30 sec hold  Patient Education - Urinary Urge Control Techniques - Urinary Urge Control Techniques ASSESSMENT:  CLINICAL IMPRESSION: Patient therapy focused on elongation of his pelvic floor and hip flexibility.  Urinary leakage happens with bladder spasms but they have reduced. He  had 5 spasm since Friday and I leaked 2 times. The day after treatment he had easier urine flow. He is sleeping better. He wears 1 pad during the day and 1 at night. He is able to wait 1 to 1.5 hours to urinate compared to every 20 minutes. Pelvic floor strength is 5/5 with good lift. He will continue to benefit from PT to work on pelvic floor relaxation, coordination, reduction of bladder spasms and improve quality of life.      OBJECTIVE IMPAIRMENTS: Abnormal gait, decreased activity tolerance, decreased coordination, decreased endurance, difficulty walking, decreased ROM, decreased strength, increased fascial restrictions, increased muscle spasms, impaired UE functional use, and pain.   ACTIVITY LIMITATIONS: continence and toileting  PARTICIPATION LIMITATIONS: interpersonal relationship and community activity  PERSONAL FACTORS: Age, Fitness, and Time since onset of injury/illness/exacerbation are also affecting patient's functional outcome.   REHAB POTENTIAL: Good  CLINICAL DECISION MAKING: Evolving/moderate complexity  EVALUATION COMPLEXITY: Moderate   GOALS: Goals reviewed with patient? Yes  SHORT TERM GOALS: Target date: 12/12/2023    Pt will be independent with HEP.   Baseline: Goal status: Met 12/12/23  2. Pt will be independent with the knack, urge suppression technique, and double voiding in order to improve bladder habits and decrease urinary  incontinence.   Baseline:  Goal status: INITIAL  3.  Pt will be independent with use of squatty potty, relaxed toileting mechanics, and improved bowel movement techniques in order to increase ease of bowel movements and complete evacuation.   Baseline:  Goal status: INITIAL  4.  Pt will demonstrate at least 45 mins of exercise without increased back and pelvic pain Baseline:  Goal status: INITIAL    LONG TERM GOALS: Target date: 02/06/2024    Pt will be independent with advanced HEP.   Baseline:  Goal status: INITIAL  2.  Pt will report max 2/10 pelvic pain in order to have better quality of life Baseline:  Goal status: INITIAL  3.  Pt will soak 0 pads/ day in order to participate in community and church without embarassement Baseline:  2-3 Goal status: INITIAL  4.  Pt will report 0 bladder spasms in 24 hrs  Baseline:  Goal status: INITIAL  5.  Pt will get up 1 time/ night to urinate in order to have increased energy during the day Baseline:  Goal status: INITIAL    PLAN:  PT FREQUENCY: 1-2x/week  PT DURATION: 12 weeks  PLANNED INTERVENTIONS: 97110-Therapeutic exercises, 97530- Therapeutic activity, 97112- Neuromuscular re-education, 97535- Self Care, 11914- Manual therapy, 97032- Electrical stimulation (manual), Taping, Dry Needling, Joint mobilization, Joint manipulation, Spinal manipulation, Spinal mobilization, Scar mobilization, Cryotherapy, Moist heat, and Biofeedback  PLAN FOR NEXT SESSION:  work on urinating every 2 hours, back strength, hip ROM, back extension, see how the doctor appointment went. Foam roll  Marsha Skeen, PT 12/12/23 11:05 AM

## 2023-12-13 DIAGNOSIS — R350 Frequency of micturition: Secondary | ICD-10-CM | POA: Diagnosis not present

## 2023-12-13 DIAGNOSIS — C61 Malignant neoplasm of prostate: Secondary | ICD-10-CM | POA: Diagnosis not present

## 2023-12-16 ENCOUNTER — Encounter: Admitting: Physical Therapy

## 2023-12-20 ENCOUNTER — Ambulatory Visit: Admitting: Physical Therapy

## 2023-12-20 ENCOUNTER — Encounter: Payer: Self-pay | Admitting: Physical Therapy

## 2023-12-20 DIAGNOSIS — R293 Abnormal posture: Secondary | ICD-10-CM

## 2023-12-20 DIAGNOSIS — R252 Cramp and spasm: Secondary | ICD-10-CM

## 2023-12-20 DIAGNOSIS — M6281 Muscle weakness (generalized): Secondary | ICD-10-CM

## 2023-12-20 DIAGNOSIS — R102 Pelvic and perineal pain: Secondary | ICD-10-CM | POA: Diagnosis not present

## 2023-12-20 NOTE — Therapy (Signed)
 OUTPATIENT PHYSICAL THERAPY MALE PELVIC TREATMENT   Patient Name: Ryan [name] MRN: [id number] DOB:[birth year], 78 y.o., male Today's Date: 12/20/2023  END OF SESSION:  PT End of Session - 12/20/23 1108     Visit Number 7    Date for PT Re-Evaluation 02/14/24    Authorization Type MEDICARE PART A AND B    Authorization - Number of Visits 10    PT Start Time 1103    PT Stop Time 1145    PT Time Calculation (min) 42 min    Activity Tolerance Patient tolerated treatment well    Behavior During Therapy WFL for tasks assessed/performed             Past Medical History:  Diagnosis Date   Arthritis 03/28/2013   Back pain    Cancer (HCC) 2014   prostate cancer   Cataracts, bilateral    Dysrhythmia    occasional PAC- asymptomatic, per MD pt cut back on caffeine   ED (erectile dysfunction) 12/18/2021   due to arterial insufficency   Fungal nail infection    GERD (gastroesophageal reflux disease)    Hearing loss    wear hearing aids   HSV infection 2016   Hyperlipidemia    Hypertension    no meds, dx yrs ago per patient but normal last several yrs   Hypertensive retinopathy    OU   Hypothyroidism    Personal history of other diseases of the digestive system    Personal history of other diseases of the nervous system and sense organs 2014   Personal history of other endocrine, nutritional and metabolic disease 4098   Pneumonia    x 1   Primary testicular hypogonadism 2017   Retarded ejaculation 2021   Sleep apnea 2014   history of, did not qualify for cpap   Past Surgical History:  Procedure Laterality Date   ankle surgery trauma Right    CATARACT EXTRACTION Left 11/29/2018   Dr. Gennie Kicks   COLONOSCOPY     COSMETIC SURGERY     loose skin removed around neck area   CYSTOSCOPY N/A 01/31/2023   Procedure: CYSTOSCOPY;  Surgeon: Lahoma Pigg, MD;  Location: [facility];  Service: Urology;  Laterality: N/A;   eye lid surgery     EYE SURGERY      fungal nail     LASIK Bilateral    PARS PLANA VITRECTOMY Left 03/01/2019   Procedure: PARS PLANA VITRECTOMY WITH 25 GAUGE;  Surgeon: Ronelle Coffee, MD;  Location: [facility] OR;  Service: Ophthalmology;  Laterality: Left;   PROSTATE BIOPSY  06/21/2022   2020, 2018   RADIOACTIVE SEED IMPLANT N/A 01/31/2023   Procedure: RADIOACTIVE SEED IMPLANT/BRACHYTHERAPY IMPLANT;  Surgeon: Lahoma Pigg, MD;  Location: [facility];  Service: Urology;  Laterality: N/A;  90 MINUTES NEEDED FOR CASE   REMOVAL RETAINED LENS Left 03/01/2019   Procedure: REMOVAL OF RETAINED LENS MATERIALS LEFT EYE;  Surgeon: Ronelle Coffee, MD;  Location: [facility] OR;  Service: Ophthalmology;  Laterality: Left;   SPACE OAR INSTILLATION N/A 01/31/2023   Procedure: SPACE OAR INSTILLATION;  Surgeon: Lahoma Pigg, MD;  Location: [facility];  Service: Urology;  Laterality: N/A;   VASECTOMY  1981   WISDOM TOOTH EXTRACTION     Patient Active Problem List   Diagnosis Date Noted   Acute cystitis with hematuria 08/08/2023   Generalized weakness 08/08/2023   AKI (acute kidney injury) (HCC) 08/04/2023   Complicated UTI (urinary  tract infection) 08/03/2023   History of herpes genitalis 09/16/2020   Cataracts, bilateral 09/05/2018   Incomplete right bundle branch block (RBBB) 09/05/2018   Prostate cancer (HCC) 02/14/2014   Hypogonadism male 06/10/2011   PROSTATE SPECIFIC ANTIGEN, ELEVATED 07/13/2010   Hypothyroidism 05/27/2008   CARCINOMA, BASAL CELL, NOSE 10/05/2007   ERECTILE DYSFUNCTION, MILD 10/05/2007   LOSS, MIXED HEARING, BILATERAL 02/01/2007   BACK PAIN 02/01/2007   Hyperlipidemia 01/20/2007   Essential hypertension 01/20/2007    PCP: Marquetta Sit, MD   REFERRING PROVIDER: Marquetta Sit, MD  REFERRING DIAG: M62.89 (ICD-10-CM) - Pelvic floor instability Bladder spasms +1 more   THERAPY DIAG:  Cramp and spasm  Abnormal posture  Pelvic pain  Muscle weakness (generalized)  Rationale  for Evaluation and Treatment: Rehabilitation  ONSET DATE: 01/2024  SUBJECTIVE:                                                                                                                                                                                           SUBJECTIVE STATEMENT: Pt reports that he is doing better each day. Does self catheter before bed, usually at night to make sure he is empty. Spasms much fewer and less intense Reports that when he has a spasm, he will leak Got up only one time at night, slept 10;30- 5:30 , which is huge Takes one gabapentin  at night, used to take 3 Walks every day for 30 mins.  Pt feels like the internal muscle massage helps him release Saw the PA at urology, PA did not think he needs  a cystoctoscope at this time. Pt getting a second opinion soon.  Wife [name] uses TTNS for urgency Pt reports that his low back pain wakes him up at night at times, does not get much deep sleep.  Has erectile dysfunction, needs to address that.  Not ready for masturbation or a pump that Bartholomew Light mentioned PAIN:  Are you having pain? Yes NPRS scale: up to 10 /10 11/18/23; pain level 6/10 12/09/23: pain level 6-7/10  Pain location: radiates to tip of penis, burns when he urinates Pain type: burning Pain description: constant , without spasm less pain  Aggravating factors: spasm, urination Relieving factors: not having spasm  PRECAUTIONS: None  RED FLAGS: Bowel or bladder incontinence: Yes:     WEIGHT BEARING RESTRICTIONS: No  FALLS:  Has patient fallen in last 6 months? No  LIVING ENVIRONMENT: Lives with: wifelives with their spouse Lives in: House/apartment Stairs: No Has following equipment at home: treatmillno  OCCUPATION: retired  PLOF: Independent  PATIENT GOALS: to be continent and less spasm  PERTINENT HISTORY:  Prostate cancer Low back ablations Sexual abuse: no  BOWEL MOVEMENT: medications- some make him constipated and some give him  diarrhea  URINATION: Pain with urination: Yes Fully empty bladder: No Stream: Weak- trickle Urgency: Yes: - when he has a spasm Frequency: yes- late aftrenoon- very 15-20 mins 12/12/23: can wait 1 to 1.5 hours to urinate.  Leakage: Urge to void and Walking to the bathroom Pads: Yes: 2-3/24 hrs 12/12/23: 1 pad during the day and 1 at night.   INTERCOURSE: has not tried- was told he would never be able to do at  OBJECTIVE:  Note: Objective measures were completed at Evaluation unless otherwise noted.  Pudendal nerve goes to the penis  PATIENT SURVEYS:  PFIQ-7 67  COGNITION: Overall cognitive status: Within functional limits for tasks assessed     SENSATION: Light touch: Appears intact Proprioception: Appears intact  MUSCLE LENGTH: Hamstrings: Right 70 deg; Left 70 deg LUMBAR SPECIAL TESTS:  Straight leg raise test: Positive for HS tightness    POSTURE: rounded shoulders, forward head, increased thoracic kyphosis, and flexed trunk   PELVIC ALIGNMENT: even  LUMBARAROM/PROM: stiffness throughout other than flexion  A/PROM A/PROM  eval  Flexion 90%   LOWER EXTREMITY AROM/PROM: limited bilateral hip ER and IR and left hip flexion reproduces  pain into tip of penis   LOWER EXTREMITY MMT: 4/5 grossly throughout PALPATION: GENERAL abdominal trigger points and tightness reproduced tip of penis pain              External Perineal Exam- tight and tender bilateral bulbospongiosus              Internal Pelvic Floor mild tenderness throughout internal and external anal sphincter Patient confirms identification and approves PT to assess internal pelvic floor and treatment Yes  PELVIC MMT:   MMT eval 12/12/23  Internal Anal Sphincter 4/5 5/5  External Anal Sphincter 4/5 5/5  Puborectalis  5/5  Diastasis Recti 1 -1-1 finger, no doming   (Blank rows = not tested)  TONE: high  TODAY'S TREATMENT:   12/20/2023    Manual- internal pelvic floor release internal pelvic  floor techniques: No emotional/communication barriers or cognitive limitation. Patient is motivated to learn. Patient understands and agrees with treatment goals and plan. PT explains patient will be examined in standing, sitting, and lying down to see how their muscles and joints work. When they are ready, they will be asked to remove their underwear so PT can examine their perineum. The patient is also given the option of providing their own chaperone as one is not provided in our facility. The patient also has the right and is explained the right to defer or refuse any part of the evaluation or treatment including the internal exam. With the patient's consent, PT will use one gloved finger to gently assess the muscles of the pelvic floor, seeing how well it contracts and relaxes and if there is muscle symmetry. After, the patient will get dressed and PT and patient will discuss exam findings and plan of care. PT and patient discuss plan of care, schedule, attendance policy and HEP activities.  Using a gloved finger going into the rectum working on the anococcygeal, levator ani, bulbocavernosus, ischiocavernosus, obturator internist, coccyx distraction, puboretalis to lengthen the muscles and reduce bladder spasms Using gloved finger in the rectum and distraction of the penis to elongate the urethra and mobilize the nerves to reduce the burning   Neuro reed- piriformis stretch bilateral with diaphragmatic breathing  Hamstring stretch- bilateral  with diaphragmatic breathing Manual feedback for diaphragmatic breathing and pelvic floor lengthening    12/12/23:      Manual: Internal pelvic floor techniques: No emotional/communication barriers or cognitive limitation. Patient is motivated to learn. Patient understands and agrees with treatment goals and plan. PT explains patient will be examined in standing, sitting, and lying down to see how their muscles and joints work. When they are ready, they will  be asked to remove their underwear so PT can examine their perineum. The patient is also given the option of providing their own chaperone as one is not provided in our facility. The patient also has the right and is explained the right to defer or refuse any part of the evaluation or treatment including the internal exam. With the patient's consent, PT will use one gloved finger to gently assess the muscles of the pelvic floor, seeing how well it contracts and relaxes and if there is muscle symmetry. After, the patient will get dressed and PT and patient will discuss exam findings and plan of care. PT and patient discuss plan of care, schedule, attendance policy and HEP activities.  Using a gloved finger going into the rectum working on the anococcygeal, levator ani, bulbocavernosus, ischiocavernosus, obturator internist, coccyx distraction, puboretalis to lengthen the muscles and reduce bladder spasms Using gloved finger in the rectum and distraction of the penis to elongate the urethra and mobilize the nerves to reduce the burning  Exercises: Stretches/mobility: Hamstring stretch in sitting holding 30 sec 2 x each Piriformis stretch in sitting holding 30 sec 2 x each    12/09/23 Manual: Internal pelvic floor techniques: No emotional/communication barriers or cognitive limitation. Patient is motivated to learn. Patient understands and agrees with treatment goals and plan. PT explains patient will be examined in standing, sitting, and lying down to see how their muscles and joints work. When they are ready, they will be asked to remove their underwear so PT can examine their perineum. The patient is also given the option of providing their own chaperone as one is not provided in our facility. The patient also has the right and is explained the right to defer or refuse any part of the evaluation or treatment including the internal exam. With the patient's consent, PT will use one gloved finger to gently  assess the muscles of the pelvic floor, seeing how well it contracts and relaxes and if there is muscle symmetry. After, the patient will get dressed and PT and patient will discuss exam findings and plan of care. PT and patient discuss plan of care, schedule, attendance policy and HEP activities.  Using a gloved finger going into the rectum working on the anococcygeal, levator ani, bulbocavernosus, ischiocavernosus, obturator internist, coccyx distraction, puboretalis to lengthen the muscles and reduce bladder spasms Using gloved finger in the rectum and distraction of the penis to elongate the urethra and mobilize the nerves to reduce the burning  Neuromuscular re-education: Down training: Diaphragmatic breathing with therapist feeling the pelvic floor drop  Diaphragmatic breathing then breathing out to push therapist finger out of the rectum Exercises: Stretches/mobility: Open book 10 x on each side to open the chest.  Self-care: Educated patient on the pelvic floor muscles, how elongation of the muscles can help with trigger points and release around the pudendal nerve to reduce burning in the penis Educated patient on working on masturbation to improve blood flow and elongation of tissue Educated patient on walking program to work on endurance    12/01/2023 There  acts- trial of TTNS- 20 mins bilateral feet                     Urge suppression techniques     There ex- open books  Diaphragmatic breathing with theraband                            11/21/2023 Manual- abdominal massage Neuro reed- diaphragmatic breathing with theraband                      Open books  There acts- urge suppression techniques      11/18/2023 Manual: Soft tissue mobilization: Manual work to the diaphragm and abdominal muscles in supine and laying on side to elongate tissue Myofascial release: Laying on left side performing fascial release along the area of the bladder, ureters to indirectly release  around them to reduce bladder pain Mobilization: Mobilization of the lateral rib cage with breath to improve expansion and contraction Neuromuscular re-education: Form correction: Open book with therapist guiding opening of the upper rib cage and having him breath outward with abdominal contraction and stretching the upper back 10 x each way Supine breathing out with therapist guiding the anterior upper rib cage downward Down training: Diaphragmatic breathing with weight on abdomin to expand the pelvic floor and tactile cues to lower rib cage to open up Sitting with diaphragmatic breathing with tactile cues to relax shoulders and feel the pelvic floor relax into the mat Therapeutic activities: Functional strengthening activities: Sit to stand with upright posture and not using his hands Sitting on commode with diaphragmatic breathing and not reducing the distance between the rib cage and pubic bone, how to push the urine out Educated patient on standing with distance between the rib cage and pubic bone Self-care: Educated patient on using pelvic floor meditation to assist with management of his pelvic pain and sent him a you tube video by Vickii Grand     PATIENT EDUCATION/ there acts  12/12/23 Education details:  Access Code: [email or web address], educated on how to relax pelvic floor to urinate, sit to stand with upright posture Person educated: Patient and Spouse Education method: Explanation, Demonstration, Tactile cues, Verbal cues, and Handouts Education comprehension: verbalized understanding, returned demonstration, verbal cues required, tactile cues required, and needs further education  HOME EXERCISE PROGRAM: 12/12/23 Access Code: [email or web address] URL: [email or web address] Date: 12/12/2023 Prepared by: Marsha Skeen  Exercises - Supine Diaphragmatic Breathing  - 1 x daily - 7 x weekly - 3 sets - 10 reps - Seated Abdominal Press into [employer]  - 1 x daily - 7 x weekly - 1 sets -  10 reps - Seated Shoulder Diagonal Pulls with Resistance  - 1 x daily - 7 x weekly - 3 sets - 10 reps - Sidelying Thoracic Rotation with Open Book  - 1 x daily - 7 x weekly - 1 sets - 10 reps - Seated Piriformis Stretch with Trunk Bend  - 1 x daily - 7 x weekly - 1 sets - 2 reps - 30 sec hold - Seated Hamstring Stretch  - 1 x daily - 7 x weekly - 1 sets - 2 reps - 30 sec hold  Patient Education - Urinary Urge Control Techniques - Urinary Urge Control Techniques ASSESSMENT:  CLINICAL IMPRESSION: Pt progressing well towards his goals. Has had 1 bladder spasm recently when he could not make it to the house aftre yard work, spasm intensity  is much reduced 4-6/10, used to be 9-10,  pt has been able to sleep better at night and  be more rested and active. He did well with internal rectal pelvic floor muscle release. Wants to get stronger and [name]. Discussed dosing and stating with  maybe 20 mins. Wife reports she has his husband back, his sense of humor has returned as well  He will continue to benefit from PT to work on pelvic floor relaxation, coordination, reduction of bladder spasms and improve quality of life.         OBJECTIVE IMPAIRMENTS: Abnormal gait, decreased activity tolerance, decreased coordination, decreased endurance, difficulty walking, decreased ROM, decreased strength, increased fascial restrictions, increased muscle spasms, impaired UE functional use, and pain.   ACTIVITY LIMITATIONS: continence and toileting  PARTICIPATION LIMITATIONS: interpersonal relationship and community activity  PERSONAL FACTORS: Age, Fitness, and Time since onset of injury/illness/exacerbation are also affecting patient's functional outcome.   REHAB POTENTIAL: Good  CLINICAL DECISION MAKING: Evolving/moderate complexity  EVALUATION COMPLEXITY: Moderate   GOALS: Goals reviewed with patient? Yes  SHORT TERM GOALS: Target date: 12/12/2023     Pt will be independent with HEP.   Baseline: Goal status: Met 12/12/23  2. Pt will be independent with the knack, urge suppression technique, and double voiding in order to improve bladder habits and decrease urinary incontinence.   Baseline:  Goal status: INITIAL  3.  Pt will be independent with use of squatty potty, relaxed toileting mechanics, and improved bowel movement techniques in order to increase ease of bowel movements and complete evacuation.   Baseline:  Goal status: INITIAL  4.  Pt will demonstrate at least 45 mins of exercise without increased back and pelvic pain Baseline:  Goal status: INITIAL    LONG TERM GOALS: Target date: 02/06/2024    Pt will be independent with advanced HEP.   Baseline:  Goal status: INITIAL  2.  Pt will report max 2/10 pelvic pain in order to have better quality of life Baseline:  Goal status: INITIAL  3.  Pt will soak 0 pads/ day in order to participate in community and church without embarassement Baseline: 2-3 Goal status: INITIAL  4.  Pt will report 0 bladder spasms in 24 hrs  Baseline:  Goal status: progressing  5.  Pt will get up 1 time/ night to urinate in order to have increased energy during the day Baseline:  Goal status: INITIAL    PLAN:  PT FREQUENCY: 1-2x/week  PT DURATION: 12 weeks  PLANNED INTERVENTIONS: 97110-Therapeutic exercises, 97530- Therapeutic activity, 97112- Neuromuscular re-education, 97535- Self Care, [id number]- Manual therapy, 97032- Electrical stimulation (manual), Taping, Dry Needling, Joint mobilization, Joint manipulation, Spinal manipulation, Spinal mobilization, Scar mobilization, Cryotherapy, Moist heat, and Biofeedback  PLAN FOR NEXT SESSION:  work on urinating every 2 hours, back strength, hip ROM, back extension, see how the doctor appointment went. Foam roll  Marsha Skeen, PT 12/20/23 11:32 AM

## 2023-12-29 ENCOUNTER — Encounter: Payer: Self-pay | Admitting: Physical Therapy

## 2023-12-29 ENCOUNTER — Ambulatory Visit: Admitting: Physical Therapy

## 2023-12-29 DIAGNOSIS — M6281 Muscle weakness (generalized): Secondary | ICD-10-CM

## 2023-12-29 DIAGNOSIS — R102 Pelvic and perineal pain: Secondary | ICD-10-CM

## 2023-12-29 DIAGNOSIS — R252 Cramp and spasm: Secondary | ICD-10-CM

## 2023-12-29 DIAGNOSIS — R293 Abnormal posture: Secondary | ICD-10-CM

## 2023-12-29 NOTE — Therapy (Signed)
 OUTPATIENT PHYSICAL THERAPY MALE PELVIC TREATMENT   Patient Name: Ryan [name] MRN: [id number] DOB:Feb 04, 1946, 78 y.o., male Today's Date: 12/29/2023  END OF SESSION:  PT End of Session - 12/29/23 1105     Visit Number 8    Date for PT Re-Evaluation 02/14/24    Authorization Type MEDICARE PART A AND B    Authorization - Visit Number 7    Authorization - Number of Visits 11    PT Start Time 1100    PT Stop Time 1145    PT Time Calculation (min) 45 min    Activity Tolerance Patient tolerated treatment well    Behavior During Therapy WFL for tasks assessed/performed              Past Medical History:  Diagnosis Date   Arthritis 03/28/2013   Back pain    Cancer (HCC) 2014   prostate cancer   Cataracts, bilateral    Dysrhythmia    occasional PAC- asymptomatic, per MD pt cut back on caffeine   ED (erectile dysfunction) 12/18/2021   due to arterial insufficency   Fungal nail infection    GERD (gastroesophageal reflux disease)    Hearing loss    wear hearing aids   HSV infection 2016   Hyperlipidemia    Hypertension    no meds, dx yrs ago per patient but normal last several yrs   Hypertensive retinopathy    OU   Hypothyroidism    Personal history of other diseases of the digestive system    Personal history of other diseases of the nervous system and sense organs 2014   Personal history of other endocrine, nutritional and metabolic disease 7985   Pneumonia    x 1   Primary testicular hypogonadism 2017   Retarded ejaculation 2021   Sleep apnea 2014   history of, did not qualify for cpap   Past Surgical History:  Procedure Laterality Date   ankle surgery trauma Right    CATARACT EXTRACTION Left 11/29/2018   Dr. Roz   COLONOSCOPY     COSMETIC SURGERY     loose skin removed around neck area   CYSTOSCOPY N/A 01/31/2023   Procedure: CYSTOSCOPY;  Surgeon: Selma Donnice SAUNDERS, MD;  Location: [facility];  Service: Urology;  Laterality: N/A;    eye lid surgery     EYE SURGERY     fungal nail     LASIK Bilateral    PARS PLANA VITRECTOMY Left 03/01/2019   Procedure: PARS PLANA VITRECTOMY WITH 25 GAUGE;  Surgeon: Valdemar Rogue, MD;  Location: [facility] OR;  Service: Ophthalmology;  Laterality: Left;   PROSTATE BIOPSY  06/21/2022   2020, 2018   RADIOACTIVE SEED IMPLANT N/A 01/31/2023   Procedure: RADIOACTIVE SEED IMPLANT/BRACHYTHERAPY IMPLANT;  Surgeon: Selma Donnice SAUNDERS, MD;  Location: [facility];  Service: Urology;  Laterality: N/A;  90 MINUTES NEEDED FOR CASE   REMOVAL RETAINED LENS Left 03/01/2019   Procedure: REMOVAL OF RETAINED LENS MATERIALS LEFT EYE;  Surgeon: Valdemar Rogue, MD;  Location: [facility] OR;  Service: Ophthalmology;  Laterality: Left;   SPACE OAR INSTILLATION N/A 01/31/2023   Procedure: SPACE OAR INSTILLATION;  Surgeon: Selma Donnice SAUNDERS, MD;  Location: [facility];  Service: Urology;  Laterality: N/A;   VASECTOMY  1981   WISDOM TOOTH EXTRACTION     Patient Active Problem List   Diagnosis Date Noted   Acute cystitis with hematuria 08/08/2023   Generalized weakness 08/08/2023   AKI (acute  kidney injury) (HCC) 08/04/2023   Complicated UTI (urinary tract infection) 08/03/2023   History of herpes genitalis 09/16/2020   Cataracts, bilateral 09/05/2018   Incomplete right bundle branch block (RBBB) 09/05/2018   Prostate cancer (HCC) 02/14/2014   Hypogonadism male 06/10/2011   PROSTATE SPECIFIC ANTIGEN, ELEVATED 07/13/2010   Hypothyroidism 05/27/2008   CARCINOMA, BASAL CELL, NOSE 10/05/2007   ERECTILE DYSFUNCTION, MILD 10/05/2007   LOSS, MIXED HEARING, BILATERAL 02/01/2007   BACK PAIN 02/01/2007   Hyperlipidemia 01/20/2007   Essential hypertension 01/20/2007    PCP: Micheal Wolm ORN, MD   REFERRING PROVIDER: Micheal Wolm ORN, MD  REFERRING DIAG: M62.89 (ICD-10-CM) - Pelvic floor instability Bladder spasms +1 more   THERAPY DIAG:  Cramp and spasm  Pelvic pain  Muscle weakness  (generalized)  Abnormal posture  Rationale for Evaluation and Treatment: Rehabilitation  ONSET DATE: 01/2024  SUBJECTIVE:                                                                                                                                                                                           SUBJECTIVE STATEMENT: Pt reports that he is doing much better. Has much less bladder spasms.  Catheterizes once/ day at night Still has burning when he urinates all the way to the tip of his penis. Feel like rectal mobs help, his flow is stronger.  He walks for exercise, deals with the tree in the back yard, does the breathing.  He can push out the pee at the end of peeing more, did not use to be able to do that.    PAIN:  Are you having pain? Yes NPRS scale: up to 10 /10 11/18/23; pain level 6/10 12/09/23: pain level 6-7/10  Pain location: radiates to tip of penis, burns when he urinates Pain type: burning Pain description: constant , without spasm less pain  Aggravating factors: spasm, urination Relieving factors: not having spasm  PRECAUTIONS: None  RED FLAGS: Bowel or bladder incontinence: Yes:     WEIGHT BEARING RESTRICTIONS: No  FALLS:  Has patient fallen in last 6 months? No  LIVING ENVIRONMENT: Lives with: wifelives with their spouse Lives in: House/apartment Stairs: No Has following equipment at home: treatmillno  OCCUPATION: retired  PLOF: Independent  PATIENT GOALS: to be continent and less spasm  PERTINENT HISTORY:  Prostate cancer Low back ablations Sexual abuse: no  BOWEL MOVEMENT: medications- some make him constipated and some give him diarrhea  URINATION: Pain with urination: Yes Fully empty bladder: No Stream: Weak- trickle Urgency: Yes: - when he has a spasm Frequency: yes- late aftrenoon- very 15-20 mins 12/12/23: can wait 1 to 1.5  hours to urinate.  Leakage: Urge to void and Walking to the bathroom Pads: Yes: 2-3/24 hrs 12/12/23: 1  pad during the day and 1 at night.   INTERCOURSE: has not tried- was told he would never be able to do at  OBJECTIVE:  Note: Objective measures were completed at Evaluation unless otherwise noted.  Pudendal nerve goes to the penis  PATIENT SURVEYS:  PFIQ-7 67  COGNITION: Overall cognitive status: Within functional limits for tasks assessed     SENSATION: Light touch: Appears intact Proprioception: Appears intact  MUSCLE LENGTH: Hamstrings: Right 70 deg; Left 70 deg LUMBAR SPECIAL TESTS:  Straight leg raise test: Positive for HS tightness    POSTURE: rounded shoulders, forward head, increased thoracic kyphosis, and flexed trunk   PELVIC ALIGNMENT: even  LUMBARAROM/PROM: stiffness throughout other than flexion  A/PROM A/PROM  eval  Flexion 90%   LOWER EXTREMITY AROM/PROM: limited bilateral hip ER and IR and left hip flexion reproduces  pain into tip of penis   LOWER EXTREMITY MMT: 4/5 grossly throughout PALPATION: GENERAL abdominal trigger points and tightness reproduced tip of penis pain              External Perineal Exam- tight and tender bilateral bulbospongiosus              Internal Pelvic Floor mild tenderness throughout internal and external anal sphincter Patient confirms identification and approves PT to assess internal pelvic floor and treatment Yes  PELVIC MMT:   MMT eval 12/12/23  Internal Anal Sphincter 4/5 5/5  External Anal Sphincter 4/5 5/5  Puborectalis  5/5  Diastasis Recti 1 -1-1 finger, no doming   (Blank rows = not tested)  TONE: high  TODAY'S TREATMENT:  12/29/2023 There acts- review of progress, HEP and goals Manual work- internal rectal pelvic floor release No emotional/communication barriers or cognitive limitation. Patient is motivated to learn. Patient understands and agrees with treatment goals and plan. PT explains patient will be examined in standing, sitting, and lying down to see how their muscles and joints work. When they  are ready, they will be asked to remove their underwear so PT can examine their perineum. The patient is also given the option of providing their own chaperone as one is not provided in our facility. The patient also has the right and is explained the right to defer or refuse any part of the evaluation or treatment including the internal exam. With the patient's consent, PT will use one gloved finger to gently assess the muscles of the pelvic floor, seeing how well it contracts and relaxes and if there is muscle symmetry. After, the patient will get dressed and PT and patient will discuss exam findings and plan of care. PT and patient discuss plan of care, schedule, attendance policy and HEP activities.  Using a gloved finger going into the rectum working on the anococcygeal, levator ani, bulbocavernosus, ischiocavernosus, obturator internist, coccyx distraction, puboretalis to lengthen the muscles and reduce bladder spasms  Neuro reed-  open books 10 reps                      Child' s pose                      Cat/ cow 15 reps  Piriformis stretch- gentle- difficult      12/20/2023    Manual- internal pelvic floor release internal pelvic floor techniques: No emotional/communication barriers or cognitive limitation. Patient is motivated to learn. Patient understands and agrees with treatment goals and plan. PT explains patient will be examined in standing, sitting, and lying down to see how their muscles and joints work. When they are ready, they will be asked to remove their underwear so PT can examine their perineum. The patient is also given the option of providing their own chaperone as one is not provided in our facility. The patient also has the right and is explained the right to defer or refuse any part of the evaluation or treatment including the internal exam. With the patient's consent, PT will use one gloved finger to gently assess the muscles of  the pelvic floor, seeing how well it contracts and relaxes and if there is muscle symmetry. After, the patient will get dressed and PT and patient will discuss exam findings and plan of care. PT and patient discuss plan of care, schedule, attendance policy and HEP activities.  Using a gloved finger going into the rectum working on the anococcygeal, levator ani, bulbocavernosus, ischiocavernosus, obturator internist, coccyx distraction, puboretalis to lengthen the muscles and reduce bladder spasms Using gloved finger in the rectum and distraction of the penis to elongate the urethra and mobilize the nerves to reduce the burning   Neuro reed- piriformis stretch bilateral with diaphragmatic breathing  Hamstring stretch- bilateral with diaphragmatic breathing Manual feedback for diaphragmatic breathing and pelvic floor lengthening    12/12/23:      Manual: Internal pelvic floor techniques: No emotional/communication barriers or cognitive limitation. Patient is motivated to learn. Patient understands and agrees with treatment goals and plan. PT explains patient will be examined in standing, sitting, and lying down to see how their muscles and joints work. When they are ready, they will be asked to remove their underwear so PT can examine their perineum. The patient is also given the option of providing their own chaperone as one is not provided in our facility. The patient also has the right and is explained the right to defer or refuse any part of the evaluation or treatment including the internal exam. With the patient's consent, PT will use one gloved finger to gently assess the muscles of the pelvic floor, seeing how well it contracts and relaxes and if there is muscle symmetry. After, the patient will get dressed and PT and patient will discuss exam findings and plan of care. PT and patient discuss plan of care, schedule, attendance policy and HEP activities.  Using a gloved finger going into the  rectum working on the anococcygeal, levator ani, bulbocavernosus, ischiocavernosus, obturator internist, coccyx distraction, puboretalis to lengthen the muscles and reduce bladder spasms Using gloved finger in the rectum and distraction of the penis to elongate the urethra and mobilize the nerves to reduce the burning  Exercises: Stretches/mobility: Hamstring stretch in sitting holding 30 sec 2 x each Piriformis stretch in sitting holding 30 sec 2 x each    12/09/23 Manual: Internal pelvic floor techniques: No emotional/communication barriers or cognitive limitation. Patient is motivated to learn. Patient understands and agrees with treatment goals and plan. PT explains patient will be examined in standing, sitting, and lying down to see how their muscles and joints work. When they are ready, they will be asked to remove their underwear so PT can examine their perineum. The patient is also given the  option of providing their own chaperone as one is not provided in our facility. The patient also has the right and is explained the right to defer or refuse any part of the evaluation or treatment including the internal exam. With the patient's consent, PT will use one gloved finger to gently assess the muscles of the pelvic floor, seeing how well it contracts and relaxes and if there is muscle symmetry. After, the patient will get dressed and PT and patient will discuss exam findings and plan of care. PT and patient discuss plan of care, schedule, attendance policy and HEP activities.  Using a gloved finger going into the rectum working on the anococcygeal, levator ani, bulbocavernosus, ischiocavernosus, obturator internist, coccyx distraction, puboretalis to lengthen the muscles and reduce bladder spasms Using gloved finger in the rectum and distraction of the penis to elongate the urethra and mobilize the nerves to reduce the burning  Neuromuscular re-education: Down training: Diaphragmatic breathing  with therapist feeling the pelvic floor drop  Diaphragmatic breathing then breathing out to push therapist finger out of the rectum Exercises: Stretches/mobility: Open book 10 x on each side to open the chest.  Self-care: Educated patient on the pelvic floor muscles, how elongation of the muscles can help with trigger points and release around the pudendal nerve to reduce burning in the penis Educated patient on working on masturbation to improve blood flow and elongation of tissue Educated patient on walking program to work on endurance    12/01/2023 There acts- trial of TTNS- 20 mins bilateral feet                     Urge suppression techniques     There ex- open books  Diaphragmatic breathing with theraband                            11/21/2023 Manual- abdominal massage Neuro reed- diaphragmatic breathing with theraband                      Open books  There acts- urge suppression techniques      11/18/2023 Manual: Soft tissue mobilization: Manual work to the diaphragm and abdominal muscles in supine and laying on side to elongate tissue Myofascial release: Laying on left side performing fascial release along the area of the bladder, ureters to indirectly release around them to reduce bladder pain Mobilization: Mobilization of the lateral rib cage with breath to improve expansion and contraction Neuromuscular re-education: Form correction: Open book with therapist guiding opening of the upper rib cage and having him breath outward with abdominal contraction and stretching the upper back 10 x each way Supine breathing out with therapist guiding the anterior upper rib cage downward Down training: Diaphragmatic breathing with weight on abdomin to expand the pelvic floor and tactile cues to lower rib cage to open up Sitting with diaphragmatic breathing with tactile cues to relax shoulders and feel the pelvic floor relax into the mat Therapeutic activities: Functional  strengthening activities: Sit to stand with upright posture and not using his hands Sitting on commode with diaphragmatic breathing and not reducing the distance between the rib cage and pubic bone, how to push the urine out Educated patient on standing with distance between the rib cage and pubic bone Self-care: Educated patient on using pelvic floor meditation to assist with management of his pelvic pain and sent him a you tube video by [employer]  PATIENT EDUCATION/ there acts  12/12/23 Education details:  Access Code: [email or web address], educated on how to relax pelvic floor to urinate, sit to stand with upright posture Person educated: Patient and Spouse Education method: Explanation, Demonstration, Tactile cues, Verbal cues, and Handouts Education comprehension: verbalized understanding, returned demonstration, verbal cues required, tactile cues required, and needs further education  HOME EXERCISE PROGRAM: 12/12/23 Access Code: [email or web address] URL: [email or web address] Date: 12/12/2023 Prepared by: Channing Pereyra  Exercises - Supine Diaphragmatic Breathing  - 1 x daily - 7 x weekly - 3 sets - 10 reps - Seated Abdominal Press into [employer]  - 1 x daily - 7 x weekly - 1 sets - 10 reps - Seated Shoulder Diagonal Pulls with Resistance  - 1 x daily - 7 x weekly - 3 sets - 10 reps - Sidelying Thoracic Rotation with Open Book  - 1 x daily - 7 x weekly - 1 sets - 10 reps - Seated Piriformis Stretch with Trunk Bend  - 1 x daily - 7 x weekly - 1 sets - 2 reps - 30 sec hold - Seated Hamstring Stretch  - 1 x daily - 7 x weekly - 1 sets - 2 reps - 30 sec hold  Patient Education - Urinary Urge Control Techniques - Urinary Urge Control Techniques ASSESSMENT:  CLINICAL IMPRESSION: Pt progressing well towards his goals He did well with internal rectal pelvic floor muscle release and stretches today. He has more energy, can do more yard work. Wife reports she has his husband back, his sense  of humor has returned as well  He will continue to benefit from PT to work on pelvic floor relaxation, coordination, reduction of bladder spasms and improve quality of life.         OBJECTIVE IMPAIRMENTS: Abnormal gait, decreased activity tolerance, decreased coordination, decreased endurance, difficulty walking, decreased ROM, decreased strength, increased fascial restrictions, increased muscle spasms, impaired UE functional use, and pain.   ACTIVITY LIMITATIONS: continence and toileting  PARTICIPATION LIMITATIONS: interpersonal relationship and community activity  PERSONAL FACTORS: Age, Fitness, and Time since onset of injury/illness/exacerbation are also affecting patient's functional outcome.   REHAB POTENTIAL: Good  CLINICAL DECISION MAKING: Evolving/moderate complexity  EVALUATION COMPLEXITY: Moderate   GOALS: Goals reviewed with patient? Yes  SHORT TERM GOALS: Target date: 12/12/2023    Pt will be independent with HEP.   Baseline: Goal status: Met 12/12/23  2. Pt will be independent with the knack, urge suppression technique, and double voiding in order to improve bladder habits and decrease urinary incontinence.   Baseline:  Goal status: INITIAL  3.  Pt will be independent with use of squatty potty, relaxed toileting mechanics, and improved bowel movement techniques in order to increase ease of bowel movements and complete evacuation.   Baseline:  Goal status: INITIAL  4.  Pt will demonstrate at least 45 mins of exercise without increased back and pelvic pain Baseline:  Goal status: INITIAL    LONG TERM GOALS: Target date: 02/06/2024    Pt will be independent with advanced HEP.   Baseline:  Goal status: INITIAL  2.  Pt will report max 2/10 pelvic pain in order to have better quality of life Baseline:  Goal status: INITIAL  3.  Pt will soak 0 pads/ day in order to participate in community and church without embarassement Baseline: 2-3 Goal status:  INITIAL  4.  Pt will report 0 bladder spasms in 24 hrs  Baseline:  Goal status: progressing  5.  Pt will  get up 1 time/ night to urinate in order to have increased energy during the day Baseline:  Goal status: INITIAL    PLAN:  PT FREQUENCY: 1-2x/week  PT DURATION: 12 weeks  PLANNED INTERVENTIONS: 97110-Therapeutic exercises, 97530- Therapeutic activity, 97112- Neuromuscular re-education, 97535- Self Care, [id number]- Manual therapy, [phone]- Electrical stimulation (manual), Taping, Dry Needling, Joint mobilization, Joint manipulation, Spinal manipulation, Spinal mobilization, Scar mobilization, Cryotherapy, Moist heat, and Biofeedback  PLAN FOR NEXT SESSION:  work on urinating every 2 hours, back strength, hip ROM, back extension, Foam roll  Marisha Renier, PT 12/29/23 11:41 AM

## 2024-01-02 ENCOUNTER — Ambulatory Visit: Admitting: Physical Therapy

## 2024-01-02 ENCOUNTER — Encounter: Payer: Self-pay | Admitting: Physical Therapy

## 2024-01-02 DIAGNOSIS — R293 Abnormal posture: Secondary | ICD-10-CM | POA: Diagnosis not present

## 2024-01-02 DIAGNOSIS — M6281 Muscle weakness (generalized): Secondary | ICD-10-CM

## 2024-01-02 DIAGNOSIS — R252 Cramp and spasm: Secondary | ICD-10-CM | POA: Diagnosis not present

## 2024-01-02 DIAGNOSIS — R102 Pelvic and perineal pain: Secondary | ICD-10-CM | POA: Diagnosis not present

## 2024-01-02 NOTE — Therapy (Signed)
 OUTPATIENT PHYSICAL THERAPY MALE PELVIC TREATMENT   Patient Name: Ryan [name] MRN: [id number] DOB:06/13/1946, 78 y.o., male Today's Date: 01/02/2024  END OF SESSION:  PT End of Session - 01/02/24 1107     Visit Number 9    Date for PT Re-Evaluation 02/14/24    Authorization Type MEDICARE PART A AND B    Authorization - Visit Number 8    PT Start Time 1100    PT Stop Time 1140    PT Time Calculation (min) 40 min    Activity Tolerance Patient tolerated treatment well    Behavior During Therapy WFL for tasks assessed/performed               Past Medical History:  Diagnosis Date   Arthritis 03/28/2013   Back pain    Cancer (HCC) 2014   prostate cancer   Cataracts, bilateral    Dysrhythmia    occasional PAC- asymptomatic, per MD pt cut back on caffeine   ED (erectile dysfunction) 12/18/2021   due to arterial insufficency   Fungal nail infection    GERD (gastroesophageal reflux disease)    Hearing loss    wear hearing aids   HSV infection 2016   Hyperlipidemia    Hypertension    no meds, dx yrs ago per patient but normal last several yrs   Hypertensive retinopathy    OU   Hypothyroidism    Personal history of other diseases of the digestive system    Personal history of other diseases of the nervous system and sense organs 2014   Personal history of other endocrine, nutritional and metabolic disease 7985   Pneumonia    x 1   Primary testicular hypogonadism 2017   Retarded ejaculation 2021   Sleep apnea 2014   history of, did not qualify for cpap   Past Surgical History:  Procedure Laterality Date   ankle surgery trauma Right    CATARACT EXTRACTION Left 11/29/2018   Dr. Roz   COLONOSCOPY     COSMETIC SURGERY     loose skin removed around neck area   CYSTOSCOPY N/A 01/31/2023   Procedure: CYSTOSCOPY;  Surgeon: Selma Donnice SAUNDERS, MD;  Location: [facility];  Service: Urology;  Laterality: N/A;   eye lid surgery     EYE SURGERY      fungal nail     LASIK Bilateral    PARS PLANA VITRECTOMY Left 03/01/2019   Procedure: PARS PLANA VITRECTOMY WITH 25 GAUGE;  Surgeon: Valdemar Rogue, MD;  Location: [facility] OR;  Service: Ophthalmology;  Laterality: Left;   PROSTATE BIOPSY  06/21/2022   2020, 2018   RADIOACTIVE SEED IMPLANT N/A 01/31/2023   Procedure: RADIOACTIVE SEED IMPLANT/BRACHYTHERAPY IMPLANT;  Surgeon: Selma Donnice SAUNDERS, MD;  Location: [facility];  Service: Urology;  Laterality: N/A;  90 MINUTES NEEDED FOR CASE   REMOVAL RETAINED LENS Left 03/01/2019   Procedure: REMOVAL OF RETAINED LENS MATERIALS LEFT EYE;  Surgeon: Valdemar Rogue, MD;  Location: [facility] OR;  Service: Ophthalmology;  Laterality: Left;   SPACE OAR INSTILLATION N/A 01/31/2023   Procedure: SPACE OAR INSTILLATION;  Surgeon: Selma Donnice SAUNDERS, MD;  Location: [facility];  Service: Urology;  Laterality: N/A;   VASECTOMY  1981   WISDOM TOOTH EXTRACTION     Patient Active Problem List   Diagnosis Date Noted   Acute cystitis with hematuria 08/08/2023   Generalized weakness 08/08/2023   AKI (acute kidney injury) (HCC) 08/04/2023   Complicated UTI (  urinary tract infection) 08/03/2023   History of herpes genitalis 09/16/2020   Cataracts, bilateral 09/05/2018   Incomplete right bundle branch block (RBBB) 09/05/2018   Prostate cancer (HCC) 02/14/2014   Hypogonadism male 06/10/2011   PROSTATE SPECIFIC ANTIGEN, ELEVATED 07/13/2010   Hypothyroidism 05/27/2008   CARCINOMA, BASAL CELL, NOSE 10/05/2007   ERECTILE DYSFUNCTION, MILD 10/05/2007   LOSS, MIXED HEARING, BILATERAL 02/01/2007   BACK PAIN 02/01/2007   Hyperlipidemia 01/20/2007   Essential hypertension 01/20/2007    PCP: Micheal Wolm ORN, MD   REFERRING PROVIDER: Micheal Wolm ORN, MD  REFERRING DIAG: M62.89 (ICD-10-CM) - Pelvic floor instability Bladder spasms +1 more   THERAPY DIAG:  Cramp and spasm  Muscle weakness (generalized)  Abnormal posture  Pelvic pain  Rationale  for Evaluation and Treatment: Rehabilitation  ONSET DATE: 01/2024  SUBJECTIVE:                                                                                                                                                                                           SUBJECTIVE STATEMENT: Pt reports that he is doing better, last night was a rough night. Catheterizes once/ day at night Had a really busy weekend, their cat is sick, was not able to do all his exercises   PAIN:  Are you having pain? Yes NPRS scale: up to 10 /10 11/18/23; pain level 6/10 12/09/23: pain level 6-7/10  Pain location: radiates to tip of penis, burns when he urinates Pain type: burning Pain description: constant , without spasm less pain  Aggravating factors: spasm, urination Relieving factors: not having spasm  PRECAUTIONS: None  RED FLAGS: Bowel or bladder incontinence: Yes:     WEIGHT BEARING RESTRICTIONS: No  FALLS:  Has patient fallen in last 6 months? No  LIVING ENVIRONMENT: Lives with: wifelives with their spouse Lives in: House/apartment Stairs: No Has following equipment at home: treatmillno  OCCUPATION: retired  PLOF: Independent  PATIENT GOALS: to be continent and less spasm  PERTINENT HISTORY:  Prostate cancer Low back ablations Sexual abuse: no  BOWEL MOVEMENT: medications- some make him constipated and some give him diarrhea  URINATION: Pain with urination: Yes Fully empty bladder: No Stream: Weak- trickle Urgency: Yes: - when he has a spasm Frequency: yes- late aftrenoon- very 15-20 mins 12/12/23: can wait 1 to 1.5 hours to urinate.  Leakage: Urge to void and Walking to the bathroom Pads: Yes: 2-3/24 hrs 12/12/23: 1 pad during the day and 1 at night.   INTERCOURSE: has not tried- was told he would never be able to do at  OBJECTIVE:  Note: Objective measures were completed at Evaluation unless otherwise  noted.  Pudendal nerve goes to the penis  PATIENT SURVEYS:  PFIQ-7  67  COGNITION: Overall cognitive status: Within functional limits for tasks assessed     SENSATION: Light touch: Appears intact Proprioception: Appears intact  MUSCLE LENGTH: Hamstrings: Right 70 deg; Left 70 deg LUMBAR SPECIAL TESTS:  Straight leg raise test: Positive for HS tightness    POSTURE: rounded shoulders, forward head, increased thoracic kyphosis, and flexed trunk   PELVIC ALIGNMENT: even  LUMBARAROM/PROM: stiffness throughout other than flexion  A/PROM A/PROM  eval  Flexion 90%   LOWER EXTREMITY AROM/PROM: limited bilateral hip ER and IR and left hip flexion reproduces  pain into tip of penis   LOWER EXTREMITY MMT: 4/5 grossly throughout PALPATION: GENERAL abdominal trigger points and tightness reproduced tip of penis pain              External Perineal Exam- tight and tender bilateral bulbospongiosus              Internal Pelvic Floor mild tenderness throughout internal and external anal sphincter Patient confirms identification and approves PT to assess internal pelvic floor and treatment Yes  PELVIC MMT:   MMT eval 12/12/23  Internal Anal Sphincter 4/5 5/5  External Anal Sphincter 4/5 5/5  Puborectalis  5/5  Diastasis Recti 1 -1-1 finger, no doming   (Blank rows = not tested)  TONE: high  TODAY'S TREATMENT:  6/30  Neuro reed- cat/ cow with diaphragmatic breathing 10 reps                      Side bending with diaphragmatic breathing 5 reps                     Child's pose with foam roller stretch 5 reps                     Foam roller routine                     Snow angels on the wall with foam roller  Patient confirms identification and approves physical therapist to perform internal soft tissue work                         Manual- pelvic floor release         12/29/2023  There acts- review of progress, HEP and goals Manual work- internal rectal pelvic floor release No emotional/communication barriers or cognitive limitation.  Patient is motivated to learn. Patient understands and agrees with treatment goals and plan. PT explains patient will be examined in standing, sitting, and lying down to see how their muscles and joints work. When they are ready, they will be asked to remove their underwear so PT can examine their perineum. The patient is also given the option of providing their own chaperone as one is not provided in our facility. The patient also has the right and is explained the right to defer or refuse any part of the evaluation or treatment including the internal exam. With the patient's consent, PT will use one gloved finger to gently assess the muscles of the pelvic floor, seeing how well it contracts and relaxes and if there is muscle symmetry. After, the patient will get dressed and PT and patient will discuss exam findings and plan of care. PT and patient discuss plan of care, schedule, attendance policy and HEP activities.  Using a gloved finger going  into the rectum working on the anococcygeal, levator ani, bulbocavernosus, ischiocavernosus, obturator internist, coccyx distraction, puboretalis to lengthen the muscles and reduce bladder spasms  Neuro reed-  open books 10 reps                      Child' s pose                      Cat/ cow 15 reps                                         Piriformis stretch- gentle- difficult      12/20/2023    Manual- internal pelvic floor release internal pelvic floor techniques: No emotional/communication barriers or cognitive limitation. Patient is motivated to learn. Patient understands and agrees with treatment goals and plan. PT explains patient will be examined in standing, sitting, and lying down to see how their muscles and joints work. When they are ready, they will be asked to remove their underwear so PT can examine their perineum. The patient is also given the option of providing their own chaperone as one is not provided in our facility. The patient also  has the right and is explained the right to defer or refuse any part of the evaluation or treatment including the internal exam. With the patient's consent, PT will use one gloved finger to gently assess the muscles of the pelvic floor, seeing how well it contracts and relaxes and if there is muscle symmetry. After, the patient will get dressed and PT and patient will discuss exam findings and plan of care. PT and patient discuss plan of care, schedule, attendance policy and HEP activities.  Using a gloved finger going into the rectum working on the anococcygeal, levator ani, bulbocavernosus, ischiocavernosus, obturator internist, coccyx distraction, puboretalis to lengthen the muscles and reduce bladder spasms Using gloved finger in the rectum and distraction of the penis to elongate the urethra and mobilize the nerves to reduce the burning   Neuro reed- piriformis stretch bilateral with diaphragmatic breathing  Hamstring stretch- bilateral with diaphragmatic breathing Manual feedback for diaphragmatic breathing and pelvic floor lengthening    12/12/23:      Manual: Internal pelvic floor techniques: No emotional/communication barriers or cognitive limitation. Patient is motivated to learn. Patient understands and agrees with treatment goals and plan. PT explains patient will be examined in standing, sitting, and lying down to see how their muscles and joints work. When they are ready, they will be asked to remove their underwear so PT can examine their perineum. The patient is also given the option of providing their own chaperone as one is not provided in our facility. The patient also has the right and is explained the right to defer or refuse any part of the evaluation or treatment including the internal exam. With the patient's consent, PT will use one gloved finger to gently assess the muscles of the pelvic floor, seeing how well it contracts and relaxes and if there is muscle symmetry. After,  the patient will get dressed and PT and patient will discuss exam findings and plan of care. PT and patient discuss plan of care, schedule, attendance policy and HEP activities.  Using a gloved finger going into the rectum working on the anococcygeal, levator ani, bulbocavernosus, ischiocavernosus, obturator internist, coccyx distraction, puboretalis to lengthen the muscles and  reduce bladder spasms Using gloved finger in the rectum and distraction of the penis to elongate the urethra and mobilize the nerves to reduce the burning  Exercises: Stretches/mobility: Hamstring stretch in sitting holding 30 sec 2 x each Piriformis stretch in sitting holding 30 sec 2 x each    12/09/23 Manual: Internal pelvic floor techniques: No emotional/communication barriers or cognitive limitation. Patient is motivated to learn. Patient understands and agrees with treatment goals and plan. PT explains patient will be examined in standing, sitting, and lying down to see how their muscles and joints work. When they are ready, they will be asked to remove their underwear so PT can examine their perineum. The patient is also given the option of providing their own chaperone as one is not provided in our facility. The patient also has the right and is explained the right to defer or refuse any part of the evaluation or treatment including the internal exam. With the patient's consent, PT will use one gloved finger to gently assess the muscles of the pelvic floor, seeing how well it contracts and relaxes and if there is muscle symmetry. After, the patient will get dressed and PT and patient will discuss exam findings and plan of care. PT and patient discuss plan of care, schedule, attendance policy and HEP activities.  Using a gloved finger going into the rectum working on the anococcygeal, levator ani, bulbocavernosus, ischiocavernosus, obturator internist, coccyx distraction, puboretalis to lengthen the muscles and reduce  bladder spasms Using gloved finger in the rectum and distraction of the penis to elongate the urethra and mobilize the nerves to reduce the burning  Neuromuscular re-education: Down training: Diaphragmatic breathing with therapist feeling the pelvic floor drop  Diaphragmatic breathing then breathing out to push therapist finger out of the rectum Exercises: Stretches/mobility: Open book 10 x on each side to open the chest.  Self-care: Educated patient on the pelvic floor muscles, how elongation of the muscles can help with trigger points and release around the pudendal nerve to reduce burning in the penis Educated patient on working on masturbation to improve blood flow and elongation of tissue Educated patient on walking program to work on endurance    12/01/2023 There acts- trial of TTNS- 20 mins bilateral feet                     Urge suppression techniques     There ex- open books  Diaphragmatic breathing with theraband                            11/21/2023 Manual- abdominal massage Neuro reed- diaphragmatic breathing with theraband                      Open books  There acts- urge suppression techniques      11/18/2023 Manual: Soft tissue mobilization: Manual work to the diaphragm and abdominal muscles in supine and laying on side to elongate tissue Myofascial release: Laying on left side performing fascial release along the area of the bladder, ureters to indirectly release around them to reduce bladder pain Mobilization: Mobilization of the lateral rib cage with breath to improve expansion and contraction Neuromuscular re-education: Form correction: Open book with therapist guiding opening of the upper rib cage and having him breath outward with abdominal contraction and stretching the upper back 10 x each way Supine breathing out with therapist guiding the anterior upper rib cage downward Down  training: Diaphragmatic breathing with weight on abdomin to expand the  pelvic floor and tactile cues to lower rib cage to open up Sitting with diaphragmatic breathing with tactile cues to relax shoulders and feel the pelvic floor relax into the mat Therapeutic activities: Functional strengthening activities: Sit to stand with upright posture and not using his hands Sitting on commode with diaphragmatic breathing and not reducing the distance between the rib cage and pubic bone, how to push the urine out Educated patient on standing with distance between the rib cage and pubic bone Self-care: Educated patient on using pelvic floor meditation to assist with management of his pelvic pain and sent him a you tube video by Loney Kettering     PATIENT EDUCATION/ there acts  12/12/23 Education details:  Access Code: [email or web address], educated on how to relax pelvic floor to urinate, sit to stand with upright posture Person educated: Patient and Spouse Education method: Explanation, Demonstration, Tactile cues, Verbal cues, and Handouts Education comprehension: verbalized understanding, returned demonstration, verbal cues required, tactile cues required, and needs further education  HOME EXERCISE PROGRAM: 12/12/23 Access Code: [email or web address] URL: [email or web address] Date: 12/12/2023 Prepared by: Channing Pereyra  Exercises - Supine Diaphragmatic Breathing  - 1 x daily - 7 x weekly - 3 sets - 10 reps - Seated Abdominal Press into [employer]  - 1 x daily - 7 x weekly - 1 sets - 10 reps - Seated Shoulder Diagonal Pulls with Resistance  - 1 x daily - 7 x weekly - 3 sets - 10 reps - Sidelying Thoracic Rotation with Open Book  - 1 x daily - 7 x weekly - 1 sets - 10 reps - Seated Piriformis Stretch with Trunk Bend  - 1 x daily - 7 x weekly - 1 sets - 2 reps - 30 sec hold - Seated Hamstring Stretch  - 1 x daily - 7 x weekly - 1 sets - 2 reps - 30 sec hold  Patient Education - Urinary Urge Control Techniques - Urinary Urge Control Techniques ASSESSMENT:  CLINICAL  IMPRESSION: Pt progressing well towards his goals. Reports that he has more control and less spasms. He did well with internal rectal pelvic floor muscle release and stretches with foam roller today. He has more energy. Needed VC's and TC's for improved diaphragmatic breathing  Wife reports she has his husband back, his sense of humor has returned as well  He will continue to benefit from PT to work on pelvic floor relaxation, coordination, reduction of bladder spasms and improve quality of life.         OBJECTIVE IMPAIRMENTS: Abnormal gait, decreased activity tolerance, decreased coordination, decreased endurance, difficulty walking, decreased ROM, decreased strength, increased fascial restrictions, increased muscle spasms, impaired UE functional use, and pain.   ACTIVITY LIMITATIONS: continence and toileting  PARTICIPATION LIMITATIONS: interpersonal relationship and community activity  PERSONAL FACTORS: Age, Fitness, and Time since onset of injury/illness/exacerbation are also affecting patient's functional outcome.   REHAB POTENTIAL: Good  CLINICAL DECISION MAKING: Evolving/moderate complexity  EVALUATION COMPLEXITY: Moderate   GOALS: Goals reviewed with patient? Yes  SHORT TERM GOALS: Target date: 12/12/2023    Pt will be independent with HEP.   Baseline: Goal status: Met 12/12/23  2. Pt will be independent with the knack, urge suppression technique, and double voiding in order to improve bladder habits and decrease urinary incontinence.   Baseline:  Goal status: INITIAL  3.  Pt will be independent with use of squatty potty, relaxed toileting mechanics, and improved  bowel movement techniques in order to increase ease of bowel movements and complete evacuation.   Baseline:  Goal status: INITIAL  4.  Pt will demonstrate at least 45 mins of exercise without increased back and pelvic pain Baseline:  Goal status: met    LONG TERM GOALS: Target date: 02/06/2024    Pt will  be independent with advanced HEP.   Baseline:  Goal status: INITIAL  2.  Pt will report max 2/10 pelvic pain in order to have better quality of life Baseline:  Goal status: INITIAL  3.  Pt will soak 0 pads/ day in order to participate in community and church without embarassement Baseline: 2-3 Goal status: INITIAL  4.  Pt will report 0 bladder spasms in 24 hrs  Baseline:  Goal status: progressing  5.  Pt will get up 1 time/ night to urinate in order to have increased energy during the day Baseline:  Goal status: progressing    PLAN:  PT FREQUENCY: 1-2x/week  PT DURATION: 12 weeks  PLANNED INTERVENTIONS: 97110-Therapeutic exercises, 97530- Therapeutic activity, 97112- Neuromuscular re-education, 97535- Self Care, [id number]- Manual therapy, [phone]- Electrical stimulation (manual), Taping, Dry Needling, Joint mobilization, Joint manipulation, Spinal manipulation, Spinal mobilization, Scar mobilization, Cryotherapy, Moist heat, and Biofeedback  PLAN FOR NEXT SESSION:  work on urinating every 2 hours, back strength, hip ROM, back extension, Foam roll  Clare Fennimore, PT 01/02/24 11:46 AM

## 2024-01-12 ENCOUNTER — Encounter: Admitting: Physical Therapy

## 2024-01-13 ENCOUNTER — Ambulatory Visit: Attending: Family Medicine | Admitting: Physical Therapy

## 2024-01-13 ENCOUNTER — Encounter: Payer: Self-pay | Admitting: Physical Therapy

## 2024-01-13 DIAGNOSIS — R252 Cramp and spasm: Secondary | ICD-10-CM | POA: Diagnosis not present

## 2024-01-13 DIAGNOSIS — R293 Abnormal posture: Secondary | ICD-10-CM | POA: Insufficient documentation

## 2024-01-13 DIAGNOSIS — R102 Pelvic and perineal pain: Secondary | ICD-10-CM | POA: Diagnosis not present

## 2024-01-13 DIAGNOSIS — M6281 Muscle weakness (generalized): Secondary | ICD-10-CM | POA: Diagnosis not present

## 2024-01-13 NOTE — Therapy (Addendum)
 OUTPATIENT PHYSICAL THERAPY MALE PELVIC TREATMENT   Patient Name: Ryan [name] MRN: [id number] DOB:02-29-1946, 78 y.o., male Today's Date: 01/13/2024 Progress Note Reporting Period 11/14/2023 to 01/13/2024  See note below for Objective Data and Assessment of Progress/Goals.     END OF SESSION:  PT End of Session - 01/13/24 1107     Visit Number 10    Date for PT Re-Evaluation 02/14/24    Authorization Type MEDICARE PART A AND B    Authorization - Visit Number 9    Authorization - Number of Visits 11    PT Start Time 1100    PT Stop Time 1140    PT Time Calculation (min) 40 min    Activity Tolerance Patient tolerated treatment well    Behavior During Therapy WFL for tasks assessed/performed                Past Medical History:  Diagnosis Date   Arthritis 03/28/2013   Back pain    Cancer (HCC) 2014   prostate cancer   Cataracts, bilateral    Dysrhythmia    occasional PAC- asymptomatic, per MD pt cut back on caffeine   ED (erectile dysfunction) 12/18/2021   due to arterial insufficency   Fungal nail infection    GERD (gastroesophageal reflux disease)    Hearing loss    wear hearing aids   HSV infection 2016   Hyperlipidemia    Hypertension    no meds, dx yrs ago per patient but normal last several yrs   Hypertensive retinopathy    OU   Hypothyroidism    Personal history of other diseases of the digestive system    Personal history of other diseases of the nervous system and sense organs 2014   Personal history of other endocrine, nutritional and metabolic disease 7985   Pneumonia    x 1   Primary testicular hypogonadism 2017   Retarded ejaculation 2021   Sleep apnea 2014   history of, did not qualify for cpap   Past Surgical History:  Procedure Laterality Date   ankle surgery trauma Right    CATARACT EXTRACTION Left 11/29/2018   Dr. Roz   COLONOSCOPY     COSMETIC SURGERY     loose skin removed around neck area   CYSTOSCOPY N/A 01/31/2023    Procedure: CYSTOSCOPY;  Surgeon: Selma Donnice SAUNDERS, MD;  Location: [facility];  Service: Urology;  Laterality: N/A;   eye lid surgery     EYE SURGERY     fungal nail     LASIK Bilateral    PARS PLANA VITRECTOMY Left 03/01/2019   Procedure: PARS PLANA VITRECTOMY WITH 25 GAUGE;  Surgeon: Valdemar Rogue, MD;  Location: [facility] OR;  Service: Ophthalmology;  Laterality: Left;   PROSTATE BIOPSY  06/21/2022   2020, 2018   RADIOACTIVE SEED IMPLANT N/A 01/31/2023   Procedure: RADIOACTIVE SEED IMPLANT/BRACHYTHERAPY IMPLANT;  Surgeon: Selma Donnice SAUNDERS, MD;  Location[facility];  Service: Urology;  Laterality: N/A;  90 MINUTES NEEDED FOR CASE   REMOVAL RETAINED LENS Left 03/01/2019   Procedure: REMOVAL OF RETAINED LENS MATERIALS LEFT EYE;  Surgeon: Valdemar Rogue, MD;  Location: [facility];  Service: Ophthalmology;  Laterality: Left;   SPACE OAR INSTILLATION N/A 01/31/2023   Procedure: SPACE OAR INSTILLATION;  Surgeon: Selma Donnice SAUNDERS, MD;  Location: [facility];  Service: Urology;  Laterality: N/A;   VASECTOMY  1981   WISDOM TOOTH EXTRACTION     Patient Active  Problem List   Diagnosis Date Noted   Acute cystitis with hematuria 08/08/2023   Generalized weakness 08/08/2023   AKI (acute kidney injury) (HCC) 08/04/2023   Complicated UTI (urinary tract infection) 08/03/2023   History of herpes genitalis 09/16/2020   Cataracts, bilateral 09/05/2018   Incomplete right bundle branch block (RBBB) 09/05/2018   Prostate cancer (HCC) 02/14/2014   Hypogonadism male 06/10/2011   PROSTATE SPECIFIC ANTIGEN, ELEVATED 07/13/2010   Hypothyroidism 05/27/2008   CARCINOMA, BASAL CELL, NOSE 10/05/2007   ERECTILE DYSFUNCTION, MILD 10/05/2007   LOSS, MIXED HEARING, BILATERAL 02/01/2007   BACK PAIN 02/01/2007   Hyperlipidemia 01/20/2007   Essential hypertension 01/20/2007    PCP: Micheal Wolm ORN, MD   REFERRING PROVIDER: Micheal Wolm ORN, MD  REFERRING DIAG: M62.89  (ICD-10-CM) - Pelvic floor instability Bladder spasms +1 more   THERAPY DIAG:  Cramp and spasm  Abnormal posture  Muscle weakness (generalized)  Pelvic pain  Rationale for Evaluation and Treatment: Rehabilitation  ONSET DATE: 01/2024  SUBJECTIVE:                                                                                                                                                                                           SUBJECTIVE STATEMENT: Patient reports that he is doing better and better, has had more control, caths at bedtime, lasts 3.5 hrs- 4 hrs. After that 2.5-3 has to get up. Feels like can't completely empt then. During the day  he is ok, has control, urinates standing up and feels like he can empty well. [facility], has been on the elliptical 15 mins each time to build his endurance. He is pleased, his wife as well.   Rectal mobs help. He feels like they helped the most.  Patient is thinking about weaning off cathing at nigh. Seeing a new urologist next week.     PAIN:  Are you having pain? Yes NPRS scale: up to 10 /10 11/18/23; pain level 6/10 12/09/23: pain level 6-7/10  Pain location: radiates to tip of penis, burns when he urinates Pain type: burning Pain description: constant , without spasm less pain  Aggravating factors: spasm, urination Relieving factors: not having spasm  PRECAUTIONS: None  RED FLAGS: Bowel or bladder incontinence: Yes:     WEIGHT BEARING RESTRICTIONS: No  FALLS:  Has patient fallen in last 6 months? No  LIVING ENVIRONMENT: Lives with: wifelives with their spouse Lives in: House/apartment Stairs: No Has following equipment at home: treatmillno  OCCUPATION: retired  PLOF: Independent  PATIENT GOALS: to be continent and less spasm  PERTINENT HISTORY:  Prostate cancer Low  back ablations Sexual abuse: no  BOWEL MOVEMENT: medications- some make him constipated and some give him  diarrhea  URINATION: Pain with urination: Yes Fully empty bladder: No Stream: Weak- trickle Urgency: Yes: - when he has a spasm Frequency: yes- late aftrenoon- very 15-20 mins 12/12/23: can wait 1 to 1.5 hours to urinate.  Leakage: Urge to void and Walking to the bathroom Pads: Yes: 2-3/24 hrs 12/12/23: 1 pad during the day and 1 at night.   INTERCOURSE: has not tried- was told he would never be able to do at  OBJECTIVE:  Note: Objective measures were completed at Evaluation unless otherwise noted.  Pudendal nerve goes to the penis  PATIENT SURVEYS:  PFIQ-7 67  COGNITION: Overall cognitive status: Within functional limits for tasks assessed     SENSATION: Light touch: Appears intact Proprioception: Appears intact  MUSCLE LENGTH: Hamstrings: Right 70 deg; Left 70 deg LUMBAR SPECIAL TESTS:  Straight leg raise test: Positive for HS tightness    POSTURE: rounded shoulders, forward head, increased thoracic kyphosis, and flexed trunk   PELVIC ALIGNMENT: even  LUMBARAROM/PROM: stiffness throughout other than flexion  A/PROM A/PROM  eval  Flexion 90%   LOWER EXTREMITY AROM/PROM: limited bilateral hip ER and IR and left hip flexion reproduces  pain into tip of penis   LOWER EXTREMITY MMT: 4/5 grossly throughout PALPATION: GENERAL abdominal trigger points and tightness reproduced tip of penis pain              External Perineal Exam- tight and tender bilateral bulbospongiosus              Internal Pelvic Floor mild tenderness throughout internal and external anal sphincter Patient confirms identification and approves PT to assess internal pelvic floor and treatment Yes  PELVIC MMT:   MMT eval 12/12/23  Internal Anal Sphincter 4/5 5/5  External Anal Sphincter 4/5 5/5  Puborectalis  5/5  Diastasis Recti 1 -1-1 finger, no doming   (Blank rows = not tested)  TONE: high  TODAY'S TREATMENT:  01/13/2024 Patient confirms identification and approves physical therapist  to perform internal soft tissue work    Neuro reeducation-open books 10 reps bilateral                                 Diaphragmatic breathing is supine and sidelying  Manual therapy- rectal pelvic floor mobilization, pelvic floor release with diaphragmatic breathing    Therapeutic activities- education on bladder diary, bladder irritants, weaning of catheter strategies, progress review and goals        6/30  Neuro reed- cat/ cow with diaphragmatic breathing 10 reps                      Side bending with diaphragmatic breathing 5 reps                     Child's pose with foam roller stretch 5 reps                     Foam roller routine                     Snow angels on the wall with foam roller  Patient confirms identification and approves physical therapist to perform internal soft tissue work  Manual- pelvic floor release         12/29/2023  There acts- review of progress, HEP and goals Manual work- internal rectal pelvic floor release No emotional/communication barriers or cognitive limitation. Patient is motivated to learn. Patient understands and agrees with treatment goals and plan. PT explains patient will be examined in standing, sitting, and lying down to see how their muscles and joints work. When they are ready, they will be asked to remove their underwear so PT can examine their perineum. The patient is also given the option of providing their own chaperone as one is not provided in our facility. The patient also has the right and is explained the right to defer or refuse any part of the evaluation or treatment including the internal exam. With the patient's consent, PT will use one gloved finger to gently assess the muscles of the pelvic floor, seeing how well it contracts and relaxes and if there is muscle symmetry. After, the patient will get dressed and PT and patient will discuss exam findings and plan of care. PT and patient discuss plan  of care, schedule, attendance policy and HEP activities.  Using a gloved finger going into the rectum working on the anococcygeal, levator ani, bulbocavernosus, ischiocavernosus, obturator internist, coccyx distraction, puboretalis to lengthen the muscles and reduce bladder spasms  Neuro reed-  open books 10 reps                      Child' s pose                      Cat/ cow 15 reps                                         Piriformis stretch- gentle- difficult      12/20/2023    Manual- internal pelvic floor release internal pelvic floor techniques: No emotional/communication barriers or cognitive limitation. Patient is motivated to learn. Patient understands and agrees with treatment goals and plan. PT explains patient will be examined in standing, sitting, and lying down to see how their muscles and joints work. When they are ready, they will be asked to remove their underwear so PT can examine their perineum. The patient is also given the option of providing their own chaperone as one is not provided in our facility. The patient also has the right and is explained the right to defer or refuse any part of the evaluation or treatment including the internal exam. With the patient's consent, PT will use one gloved finger to gently assess the muscles of the pelvic floor, seeing how well it contracts and relaxes and if there is muscle symmetry. After, the patient will get dressed and PT and patient will discuss exam findings and plan of care. PT and patient discuss plan of care, schedule, attendance policy and HEP activities.  Using a gloved finger going into the rectum working on the anococcygeal, levator ani, bulbocavernosus, ischiocavernosus, obturator internist, coccyx distraction, puboretalis to lengthen the muscles and reduce bladder spasms Using gloved finger in the rectum and distraction of the penis to elongate the urethra and mobilize the nerves to reduce the burning   Neuro reed-  piriformis stretch bilateral with diaphragmatic breathing  Hamstring stretch- bilateral with diaphragmatic breathing Manual feedback for diaphragmatic breathing and pelvic floor lengthening    12/12/23:  Manual: Internal pelvic floor techniques: No emotional/communication barriers or cognitive limitation. Patient is motivated to learn. Patient understands and agrees with treatment goals and plan. PT explains patient will be examined in standing, sitting, and lying down to see how their muscles and joints work. When they are ready, they will be asked to remove their underwear so PT can examine their perineum. The patient is also given the option of providing their own chaperone as one is not provided in our facility. The patient also has the right and is explained the right to defer or refuse any part of the evaluation or treatment including the internal exam. With the patient's consent, PT will use one gloved finger to gently assess the muscles of the pelvic floor, seeing how well it contracts and relaxes and if there is muscle symmetry. After, the patient will get dressed and PT and patient will discuss exam findings and plan of care. PT and patient discuss plan of care, schedule, attendance policy and HEP activities.  Using a gloved finger going into the rectum working on the anococcygeal, levator ani, bulbocavernosus, ischiocavernosus, obturator internist, coccyx distraction, puboretalis to lengthen the muscles and reduce bladder spasms Using gloved finger in the rectum and distraction of the penis to elongate the urethra and mobilize the nerves to reduce the burning  Exercises: Stretches/mobility: Hamstring stretch in sitting holding 30 sec 2 x each Piriformis stretch in sitting holding 30 sec 2 x each    12/09/23 Manual: Internal pelvic floor techniques: No emotional/communication barriers or cognitive limitation. Patient is motivated to learn. Patient understands and agrees with  treatment goals and plan. PT explains patient will be examined in standing, sitting, and lying down to see how their muscles and joints work. When they are ready, they will be asked to remove their underwear so PT can examine their perineum. The patient is also given the option of providing their own chaperone as one is not provided in our facility. The patient also has the right and is explained the right to defer or refuse any part of the evaluation or treatment including the internal exam. With the patient's consent, PT will use one gloved finger to gently assess the muscles of the pelvic floor, seeing how well it contracts and relaxes and if there is muscle symmetry. After, the patient will get dressed and PT and patient will discuss exam findings and plan of care. PT and patient discuss plan of care, schedule, attendance policy and HEP activities.  Using a gloved finger going into the rectum working on the anococcygeal, levator ani, bulbocavernosus, ischiocavernosus, obturator internist, coccyx distraction, puboretalis to lengthen the muscles and reduce bladder spasms Using gloved finger in the rectum and distraction of the penis to elongate the urethra and mobilize the nerves to reduce the burning  Neuromuscular re-education: Down training: Diaphragmatic breathing with therapist feeling the pelvic floor drop  Diaphragmatic breathing then breathing out to push therapist finger out of the rectum Exercises: Stretches/mobility: Open book 10 x on each side to open the chest.  Self-care: Educated patient on the pelvic floor muscles, how elongation of the muscles can help with trigger points and release around the pudendal nerve to reduce burning in the penis Educated patient on working on masturbation to improve blood flow and elongation of tissue Educated patient on walking program to work on endurance    12/01/2023 There acts- trial of TTNS- 20 mins bilateral feet  Urge  suppression techniques     There ex- open books  Diaphragmatic breathing with theraband                            11/21/2023 Manual- abdominal massage Neuro reed- diaphragmatic breathing with theraband                      Open books  There acts- urge suppression techniques      11/18/2023 Manual: Soft tissue mobilization: Manual work to the diaphragm and abdominal muscles in supine and laying on side to elongate tissue Myofascial release: Laying on left side performing fascial release along the area of the bladder, ureters to indirectly release around them to reduce bladder pain Mobilization: Mobilization of the lateral rib cage with breath to improve expansion and contraction Neuromuscular re-education: Form correction: Open book with therapist guiding opening of the upper rib cage and having him breath outward with abdominal contraction and stretching the upper back 10 x each way Supine breathing out with therapist guiding the anterior upper rib cage downward Down training: Diaphragmatic breathing with weight on abdomin to expand the pelvic floor and tactile cues to lower rib cage to open up Sitting with diaphragmatic breathing with tactile cues to relax shoulders and feel the pelvic floor relax into the mat Therapeutic activities: Functional strengthening activities: Sit to stand with upright posture and not using his hands Sitting on commode with diaphragmatic breathing and not reducing the distance between the rib cage and pubic bone, how to push the urine out Educated patient on standing with distance between the rib cage and pubic bone Self-care: Educated patient on using pelvic floor meditation to assist with management of his pelvic pain and sent him a you tube video by Loney Kettering     PATIENT EDUCATION/ there acts  12/12/23 Education details:  Access Code: [email or web address], educated on how to relax pelvic floor to urinate, sit to stand with upright posture Person  educated: Patient and Spouse Education method: Explanation, Demonstration, Tactile cues, Verbal cues, and Handouts Education comprehension: verbalized understanding, returned demonstration, verbal cues required, tactile cues required, and needs further education  HOME EXERCISE PROGRAM: 12/12/23 Access Code: [email or web address] URL: [email or web address] Date: 12/12/2023 Prepared by: Channing Pereyra  Exercises - Supine Diaphragmatic Breathing  - 1 x daily - 7 x weekly - 3 sets - 10 reps - Seated Abdominal Press into [employer]  - 1 x daily - 7 x weekly - 1 sets - 10 reps - Seated Shoulder Diagonal Pulls with Resistance  - 1 x daily - 7 x weekly - 3 sets - 10 reps - Sidelying Thoracic Rotation with Open Book  - 1 x daily - 7 x weekly - 1 sets - 10 reps - Seated Piriformis Stretch with Trunk Bend  - 1 x daily - 7 x weekly - 1 sets - 2 reps - 30 sec hold - Seated Hamstring Stretch  - 1 x daily - 7 x weekly - 1 sets - 2 reps - 30 sec hold  Patient Education - Urinary Urge Control Techniques - Urinary Urge Control Techniques ASSESSMENT:  CLINICAL IMPRESSION: Pt progressing well towards his goals. Reports that he has more control and less bladder spasms. He did well with internal rectal pelvic floor muscle release and stretches today. He has had more energy. Needed VC's and TC's for improved diaphragmatic breathing.  Wife reports that he is doing well, is ready to wean  himself off catheterizing at night. Discussed filling out bladder diary for 3 days to identify bladder habits. He still has bladder spasms and has to get up at night to urinate but less. He still has some leaking and pelvic pain.  He will continue to benefit from PT to work on pelvic floor relaxation, coordination, reduction of bladder spasms and improve quality of life.         OBJECTIVE IMPAIRMENTS: Abnormal gait, decreased activity tolerance, decreased coordination, decreased endurance, difficulty walking, decreased ROM,  decreased strength, increased fascial restrictions, increased muscle spasms, impaired UE functional use, and pain.   ACTIVITY LIMITATIONS: continence and toileting  PARTICIPATION LIMITATIONS: interpersonal relationship and community activity  PERSONAL FACTORS: Age, Fitness, and Time since onset of injury/illness/exacerbation are also affecting patient's functional outcome.   REHAB POTENTIAL: Good  CLINICAL DECISION MAKING: Evolving/moderate complexity  EVALUATION COMPLEXITY: Moderate   GOALS: Goals reviewed with patient? Yes  SHORT TERM GOALS: Target date: 12/12/2023    Pt will be independent with HEP.   Baseline: Goal status: Met 12/12/23  2. Pt will be independent with the knack, urge suppression technique, and double voiding in order to improve bladder habits and decrease urinary incontinence.   Baseline:  Goal status: progressing 01/13/2024  3.  Pt will be independent with use of squatty potty, relaxed toileting mechanics, and improved bowel movement techniques in order to increase ease of bowel movements and complete evacuation.   Baseline:  Goal status: progressing 01/13/2024  4.  Pt will demonstrate at least 45 mins of exercise without increased back and pelvic pain Baseline:  Goal status: met 01/13/2024    LONG TERM GOALS: Target date: 02/06/2024    Pt will be independent with advanced HEP.   Baseline:  Goal status: progressing 01/13/2024  2.  Pt will report max 2/10 pelvic pain in order to have better quality of life Baseline:  Goal status: progressing 2-3/10 on 01/13/2024  3.  Pt will soak 0 pads/ day in order to participate in community and church without embarassement Baseline: 2-3 Goal status: progressing 01/13/2024  4.  Pt will report 0 bladder spasms in 24 hrs  Baseline:  Goal status: progressing 01/13/2024  5.  Pt will get up 1 time/ night to urinate in order to have increased energy during the day Baseline:  Goal status: progressing  01/13/2024    PLAN:  PT FREQUENCY: 1-2x/week  PT DURATION: 12 weeks  PLANNED INTERVENTIONS: 97110-Therapeutic exercises, 97530- Therapeutic activity, 97112- Neuromuscular re-education, 97535- Self Care, [id number]- Manual therapy, [phone]- Electrical stimulation (manual), Taping, Dry Needling, Joint mobilization, Joint manipulation, Spinal manipulation, Spinal mobilization, Scar mobilization, Cryotherapy, Moist heat, and Biofeedback  PLAN FOR NEXT SESSION:  work on urinating every 2 hours, back strength, hip ROM, back extension, Foam roll  Gordon Vandunk, PT 01/13/24 11:41 AM

## 2024-01-16 ENCOUNTER — Ambulatory Visit: Admitting: Physical Therapy

## 2024-01-17 ENCOUNTER — Encounter: Admitting: Physical Therapy

## 2024-01-17 DIAGNOSIS — N3289 Other specified disorders of bladder: Secondary | ICD-10-CM | POA: Diagnosis not present

## 2024-01-17 DIAGNOSIS — N400 Enlarged prostate without lower urinary tract symptoms: Secondary | ICD-10-CM | POA: Diagnosis not present

## 2024-01-17 DIAGNOSIS — C61 Malignant neoplasm of prostate: Secondary | ICD-10-CM | POA: Diagnosis not present

## 2024-01-17 DIAGNOSIS — R3129 Other microscopic hematuria: Secondary | ICD-10-CM | POA: Diagnosis not present

## 2024-01-19 ENCOUNTER — Encounter: Payer: Self-pay | Admitting: Physical Therapy

## 2024-01-19 ENCOUNTER — Ambulatory Visit: Admitting: Physical Therapy

## 2024-01-19 DIAGNOSIS — R252 Cramp and spasm: Secondary | ICD-10-CM | POA: Diagnosis not present

## 2024-01-19 DIAGNOSIS — M6281 Muscle weakness (generalized): Secondary | ICD-10-CM

## 2024-01-19 DIAGNOSIS — R102 Pelvic and perineal pain: Secondary | ICD-10-CM

## 2024-01-19 DIAGNOSIS — R293 Abnormal posture: Secondary | ICD-10-CM

## 2024-01-19 NOTE — Therapy (Addendum)
 OUTPATIENT PHYSICAL THERAPY MALE PELVIC TREATMENT   Patient Name: Ryan [name] MRN: [id number] DOB:1946-05-18, 78 y.o., male Today's Date: 01/19/2024  END OF SESSION:  PT End of Session - 01/19/24 1227     Visit Number 11    Date for PT Re-Evaluation 02/14/24    Authorization Type MEDICARE PART A AND B    Authorization - Visit Number 10    Authorization - Number of Visits 12    PT Start Time 1224    PT Stop Time 1315    PT Time Calculation (min) 51 min    Activity Tolerance Patient tolerated treatment well    Behavior During Therapy WFL for tasks assessed/performed                 Past Medical History:  Diagnosis Date   Arthritis 03/28/2013   Back pain    Cancer (HCC) 2014   prostate cancer   Cataracts, bilateral    Dysrhythmia    occasional PAC- asymptomatic, per MD pt cut back on caffeine   ED (erectile dysfunction) 12/18/2021   due to arterial insufficency   Fungal nail infection    GERD (gastroesophageal reflux disease)    Hearing loss    wear hearing aids   HSV infection 2016   Hyperlipidemia    Hypertension    no meds, dx yrs ago per patient but normal last several yrs   Hypertensive retinopathy    OU   Hypothyroidism    Personal history of other diseases of the digestive system    Personal history of other diseases of the nervous system and sense organs 2014   Personal history of other endocrine, nutritional and metabolic disease 7985   Pneumonia    x 1   Primary testicular hypogonadism 2017   Retarded ejaculation 2021   Sleep apnea 2014   history of, did not qualify for cpap   Past Surgical History:  Procedure Laterality Date   ankle surgery trauma Right    CATARACT EXTRACTION Left 11/29/2018   Dr. Roz   COLONOSCOPY     COSMETIC SURGERY     loose skin removed around neck area   CYSTOSCOPY N/A 01/31/2023   Procedure: CYSTOSCOPY;  Surgeon: Selma Donnice SAUNDERS, MD;  Location: [facility];  Service: Urology;  Laterality:  N/A;   eye lid surgery     EYE SURGERY     fungal nail     LASIK Bilateral    PARS PLANA VITRECTOMY Left 03/01/2019   Procedure: PARS PLANA VITRECTOMY WITH 25 GAUGE;  Surgeon: Valdemar Rogue, MD;  Location: [facility] OR;  Service: Ophthalmology;  Laterality: Left;   PROSTATE BIOPSY  06/21/2022   2020, 2018   RADIOACTIVE SEED IMPLANT N/A 01/31/2023   Procedure: RADIOACTIVE SEED IMPLANT/BRACHYTHERAPY IMPLANT;  Surgeon: Selma Donnice SAUNDERS, MD;  Location: [facility];  Service: Urology;  Laterality: N/A;  90 MINUTES NEEDED FOR CASE   REMOVAL RETAINED LENS Left 03/01/2019   Procedure: REMOVAL OF RETAINED LENS MATERIALS LEFT EYE;  Surgeon: Valdemar Rogue, MD;  Location: [facility] OR;  Service: Ophthalmology;  Laterality: Left;   SPACE OAR INSTILLATION N/A 01/31/2023   Procedure: SPACE OAR INSTILLATION;  Surgeon: Selma Donnice SAUNDERS, MD;  Location: [facility];  Service: Urology;  Laterality: N/A;   VASECTOMY  1981   WISDOM TOOTH EXTRACTION     Patient Active Problem List   Diagnosis Date Noted   Acute cystitis with hematuria 08/08/2023   Generalized weakness 08/08/2023  AKI (acute kidney injury) (HCC) 08/04/2023   Complicated UTI (urinary tract infection) 08/03/2023   History of herpes genitalis 09/16/2020   Cataracts, bilateral 09/05/2018   Incomplete right bundle branch block (RBBB) 09/05/2018   Prostate cancer (HCC) 02/14/2014   Hypogonadism male 06/10/2011   PROSTATE SPECIFIC ANTIGEN, ELEVATED 07/13/2010   Hypothyroidism 05/27/2008   CARCINOMA, BASAL CELL, NOSE 10/05/2007   ERECTILE DYSFUNCTION, MILD 10/05/2007   LOSS, MIXED HEARING, BILATERAL 02/01/2007   BACK PAIN 02/01/2007   Hyperlipidemia 01/20/2007   Essential hypertension 01/20/2007    PCP: Micheal Wolm ORN, MD   REFERRING PROVIDER: Micheal Wolm ORN, MD  REFERRING DIAG: M62.89 (ICD-10-CM) - Pelvic floor instability Bladder spasms +1 more   THERAPY DIAG:  Cramp and spasm  Muscle weakness  (generalized)  Abnormal posture  Pelvic pain  Rationale for Evaluation and Treatment: Rehabilitation  ONSET DATE: 01/2024  SUBJECTIVE:                                                                                                                                                                                           SUBJECTIVE STATEMENT: Patient reports that his progress has slowed down a little.  Has had his first urinalysis since the this whole thing started Taking Cialis now to increase urine output Awaiting cystoscopy Last night was 4 hours of sleep Will try weaning off catheter on Friday During the day he goes every 2 hours  PAIN:  Are you having pain? Yes NPRS scale: up to 10 /10 11/18/23; pain level 6/10 12/09/23: pain level 6-7/10  Pain location: radiates to tip of penis, burns when he urinates Pain type: burning Pain description: constant , without spasm less pain  Aggravating factors: spasm, urination Relieving factors: not having spasm  PRECAUTIONS: None  RED FLAGS: Bowel or bladder incontinence: Yes:     WEIGHT BEARING RESTRICTIONS: No  FALLS:  Has patient fallen in last 6 months? No  LIVING ENVIRONMENT: Lives with: wifelives with their spouse Lives in: House/apartment Stairs: No Has following equipment at home: treatmillno  OCCUPATION: retired  PLOF: Independent  PATIENT GOALS: to be continent and less spasm  PERTINENT HISTORY:  Prostate cancer Low back ablations Sexual abuse: no  BOWEL MOVEMENT: medications- some make him constipated and some give him diarrhea  URINATION: Pain with urination: Yes Fully empty bladder: No Stream: Weak- trickle Urgency: Yes: - when he has a spasm Frequency: yes- late aftrenoon- very 15-20 mins 12/12/23: can wait 1 to 1.5 hours to urinate.  Leakage: Urge to void and Walking to the bathroom Pads: Yes: 2-3/24 hrs 12/12/23: 1 pad during the day and 1 at night.  INTERCOURSE: has not tried- was told he would  never be able to do at  OBJECTIVE:  Note: Objective measures were completed at Evaluation unless otherwise noted.  Pudendal nerve goes to the penis  PATIENT SURVEYS:  PFIQ-7 67  COGNITION: Overall cognitive status: Within functional limits for tasks assessed     SENSATION: Light touch: Appears intact Proprioception: Appears intact  MUSCLE LENGTH: Hamstrings: Right 70 deg; Left 70 deg LUMBAR SPECIAL TESTS:  Straight leg raise test: Positive for HS tightness    POSTURE: rounded shoulders, forward head, increased thoracic kyphosis, and flexed trunk   PELVIC ALIGNMENT: even  LUMBARAROM/PROM: stiffness throughout other than flexion  A/PROM A/PROM  eval  Flexion 90%   LOWER EXTREMITY AROM/PROM: limited bilateral hip ER and IR and left hip flexion reproduces  pain into tip of penis   LOWER EXTREMITY MMT: 4/5 grossly throughout PALPATION: GENERAL abdominal trigger points and tightness reproduced tip of penis pain              External Perineal Exam- tight and tender bilateral bulbospongiosus              Internal Pelvic Floor mild tenderness throughout internal and external anal sphincter Patient confirms identification and approves PT to assess internal pelvic floor and treatment Yes  PELVIC MMT:   MMT eval 12/12/23  Internal Anal Sphincter 4/5 5/5  External Anal Sphincter 4/5 5/5  Puborectalis  5/5  Diastasis Recti 1 -1-1 finger, no doming   (Blank rows = not tested)  TONE: high  TODAY'S TREATMENT:  01/19/2024 Neuro reed- open books with foam roller, child's pose 1 min                     Foam roller stretch from child's pose 10 reps                    Seated back stretches and side stretch 10 reps with foam roller Cat/ cow 10 reps with diaphragmatic breathing Figure four stretch bilateral 5 reps with DB                     Manual- rectal work for pelvic floor release  There acts- review of progress, HEP, self cathing      01/13/2024 Patient confirms  identification and approves physical therapist to perform internal soft tissue work    Neuro reeducation-open books 10 reps bilateral                                 Diaphragmatic breathing is supine and sidelying  Manual therapy- rectal pelvic floor mobilization, pelvic floor release with diaphragmatic breathing    Therapeutic activities- education on bladder diary, bladder irritants, weaning of catheter strategies, progress review and goals        6/30  Neuro reed- cat/ cow with diaphragmatic breathing 10 reps                      Side bending with diaphragmatic breathing 5 reps                     Child's pose with foam roller stretch 5 reps                     Foam roller routine  Snow angels on the wall with foam roller  Patient confirms identification and approves physical therapist to perform internal soft tissue work                         Manual- pelvic floor release         12/29/2023  There acts- review of progress, HEP and goals Manual work- internal rectal pelvic floor release No emotional/communication barriers or cognitive limitation. Patient is motivated to learn. Patient understands and agrees with treatment goals and plan. PT explains patient will be examined in standing, sitting, and lying down to see how their muscles and joints work. When they are ready, they will be asked to remove their underwear so PT can examine their perineum. The patient is also given the option of providing their own chaperone as one is not provided in our facility. The patient also has the right and is explained the right to defer or refuse any part of the evaluation or treatment including the internal exam. With the patient's consent, PT will use one gloved finger to gently assess the muscles of the pelvic floor, seeing how well it contracts and relaxes and if there is muscle symmetry. After, the patient will get dressed and PT and patient will discuss exam  findings and plan of care. PT and patient discuss plan of care, schedule, attendance policy and HEP activities.  Using a gloved finger going into the rectum working on the anococcygeal, levator ani, bulbocavernosus, ischiocavernosus, obturator internist, coccyx distraction, puboretalis to lengthen the muscles and reduce bladder spasms  Neuro reed-  open books 10 reps                      Child' s pose                      Cat/ cow 15 reps                                         Piriformis stretch- gentle- difficult      12/20/2023    Manual- internal pelvic floor release internal pelvic floor techniques: No emotional/communication barriers or cognitive limitation. Patient is motivated to learn. Patient understands and agrees with treatment goals and plan. PT explains patient will be examined in standing, sitting, and lying down to see how their muscles and joints work. When they are ready, they will be asked to remove their underwear so PT can examine their perineum. The patient is also given the option of providing their own chaperone as one is not provided in our facility. The patient also has the right and is explained the right to defer or refuse any part of the evaluation or treatment including the internal exam. With the patient's consent, PT will use one gloved finger to gently assess the muscles of the pelvic floor, seeing how well it contracts and relaxes and if there is muscle symmetry. After, the patient will get dressed and PT and patient will discuss exam findings and plan of care. PT and patient discuss plan of care, schedule, attendance policy and HEP activities.  Using a gloved finger going into the rectum working on the anococcygeal, levator ani, bulbocavernosus, ischiocavernosus, obturator internist, coccyx distraction, puboretalis to lengthen the muscles and reduce bladder spasms Using gloved finger in the rectum and distraction of the  penis to elongate the urethra and  mobilize the nerves to reduce the burning   Neuro reed- piriformis stretch bilateral with diaphragmatic breathing  Hamstring stretch- bilateral with diaphragmatic breathing Manual feedback for diaphragmatic breathing and pelvic floor lengthening    12/12/23:      Manual: Internal pelvic floor techniques: No emotional/communication barriers or cognitive limitation. Patient is motivated to learn. Patient understands and agrees with treatment goals and plan. PT explains patient will be examined in standing, sitting, and lying down to see how their muscles and joints work. When they are ready, they will be asked to remove their underwear so PT can examine their perineum. The patient is also given the option of providing their own chaperone as one is not provided in our facility. The patient also has the right and is explained the right to defer or refuse any part of the evaluation or treatment including the internal exam. With the patient's consent, PT will use one gloved finger to gently assess the muscles of the pelvic floor, seeing how well it contracts and relaxes and if there is muscle symmetry. After, the patient will get dressed and PT and patient will discuss exam findings and plan of care. PT and patient discuss plan of care, schedule, attendance policy and HEP activities.  Using a gloved finger going into the rectum working on the anococcygeal, levator ani, bulbocavernosus, ischiocavernosus, obturator internist, coccyx distraction, puboretalis to lengthen the muscles and reduce bladder spasms Using gloved finger in the rectum and distraction of the penis to elongate the urethra and mobilize the nerves to reduce the burning  Exercises: Stretches/mobility: Hamstring stretch in sitting holding 30 sec 2 x each Piriformis stretch in sitting holding 30 sec 2 x each    12/09/23 Manual: Internal pelvic floor techniques: No emotional/communication barriers or cognitive limitation. Patient is  motivated to learn. Patient understands and agrees with treatment goals and plan. PT explains patient will be examined in standing, sitting, and lying down to see how their muscles and joints work. When they are ready, they will be asked to remove their underwear so PT can examine their perineum. The patient is also given the option of providing their own chaperone as one is not provided in our facility. The patient also has the right and is explained the right to defer or refuse any part of the evaluation or treatment including the internal exam. With the patient's consent, PT will use one gloved finger to gently assess the muscles of the pelvic floor, seeing how well it contracts and relaxes and if there is muscle symmetry. After, the patient will get dressed and PT and patient will discuss exam findings and plan of care. PT and patient discuss plan of care, schedule, attendance policy and HEP activities.  Using a gloved finger going into the rectum working on the anococcygeal, levator ani, bulbocavernosus, ischiocavernosus, obturator internist, coccyx distraction, puboretalis to lengthen the muscles and reduce bladder spasms Using gloved finger in the rectum and distraction of the penis to elongate the urethra and mobilize the nerves to reduce the burning  Neuromuscular re-education: Down training: Diaphragmatic breathing with therapist feeling the pelvic floor drop  Diaphragmatic breathing then breathing out to push therapist finger out of the rectum Exercises: Stretches/mobility: Open book 10 x on each side to open the chest.  Self-care: Educated patient on the pelvic floor muscles, how elongation of the muscles can help with trigger points and release around the pudendal nerve to reduce burning in the penis Educated  patient on working on masturbation to improve blood flow and elongation of tissue Educated patient on walking program to work on endurance    12/01/2023 There acts- trial of TTNS-  20 mins bilateral feet                     Urge suppression techniques     There ex- open books  Diaphragmatic breathing with theraband                            11/21/2023 Manual- abdominal massage Neuro reed- diaphragmatic breathing with theraband                      Open books  There acts- urge suppression techniques      11/18/2023 Manual: Soft tissue mobilization: Manual work to the diaphragm and abdominal muscles in supine and laying on side to elongate tissue Myofascial release: Laying on left side performing fascial release along the area of the bladder, ureters to indirectly release around them to reduce bladder pain Mobilization: Mobilization of the lateral rib cage with breath to improve expansion and contraction Neuromuscular re-education: Form correction: Open book with therapist guiding opening of the upper rib cage and having him breath outward with abdominal contraction and stretching the upper back 10 x each way Supine breathing out with therapist guiding the anterior upper rib cage downward Down training: Diaphragmatic breathing with weight on abdomin to expand the pelvic floor and tactile cues to lower rib cage to open up Sitting with diaphragmatic breathing with tactile cues to relax shoulders and feel the pelvic floor relax into the mat Therapeutic activities: Functional strengthening activities: Sit to stand with upright posture and not using his hands Sitting on commode with diaphragmatic breathing and not reducing the distance between the rib cage and pubic bone, how to push the urine out Educated patient on standing with distance between the rib cage and pubic bone Self-care: Educated patient on using pelvic floor meditation to assist with management of his pelvic pain and sent him a you tube video by Loney Kettering     PATIENT EDUCATION/ there acts  12/12/23 Education details:  Access Code: [email or web address], educated on how to relax pelvic floor to urinate,  sit to stand with upright posture Person educated: Patient and Spouse Education method: Explanation, Demonstration, Tactile cues, Verbal cues, and Handouts Education comprehension: verbalized understanding, returned demonstration, verbal cues required, tactile cues required, and needs further education  HOME EXERCISE PROGRAM: 12/12/23 Access Code: [email or web address] URL: [email or web address] Date: 12/12/2023 Prepared by: Channing Pereyra  Exercises - Supine Diaphragmatic Breathing  - 1 x daily - 7 x weekly - 3 sets - 10 reps - Seated Abdominal Press into [employer]  - 1 x daily - 7 x weekly - 1 sets - 10 reps - Seated Shoulder Diagonal Pulls with Resistance  - 1 x daily - 7 x weekly - 3 sets - 10 reps - Sidelying Thoracic Rotation with Open Book  - 1 x daily - 7 x weekly - 1 sets - 10 reps - Seated Piriformis Stretch with Trunk Bend  - 1 x daily - 7 x weekly - 1 sets - 2 reps - 30 sec hold - Seated Hamstring Stretch  - 1 x daily - 7 x weekly - 1 sets - 2 reps - 30 sec hold  Patient Education - Urinary Urge Control Techniques - Urinary Urge Control Techniques ASSESSMENT:  CLINICAL  IMPRESSION:  Pt progressing well towards his goals. Reports that he has more control and less spasms. He did well with internal rectal pelvic floor muscle release and stretches today. Needed VC's and TC's for improved diaphragmatic breathing.  Wife reports that he is doing well, is ready to wean himself off catheterizing at night. Discussed filling out bladder diary for 3 days to identify habits. Discussed importance of consistency with HEP, they have been going to the gym too to strengthen.He will continue to benefit from PT to work on pelvic floor relaxation, coordination, reduction of bladder spasms and improve quality of life.         OBJECTIVE IMPAIRMENTS: Abnormal gait, decreased activity tolerance, decreased coordination, decreased endurance, difficulty walking, decreased ROM, decreased strength,  increased fascial restrictions, increased muscle spasms, impaired UE functional use, and pain.   ACTIVITY LIMITATIONS: continence and toileting  PARTICIPATION LIMITATIONS: interpersonal relationship and community activity  PERSONAL FACTORS: Age, Fitness, and Time since onset of injury/illness/exacerbation are also affecting patient's functional outcome.   REHAB POTENTIAL: Good  CLINICAL DECISION MAKING: Evolving/moderate complexity  EVALUATION COMPLEXITY: Moderate   GOALS: Goals reviewed with patient? Yes  SHORT TERM GOALS: Target date: 12/12/2023    Pt will be independent with HEP.   Baseline: Goal status: Met 12/12/23  2. Pt will be independent with the knack, urge suppression technique, and double voiding in order to improve bladder habits and decrease urinary incontinence.   Baseline:  Goal status: progressing  3.  Pt will be independent with use of squatty potty, relaxed toileting mechanics, and improved bowel movement techniques in order to increase ease of bowel movements and complete evacuation.   Baseline:  Goal status: progressing  4.  Pt will demonstrate at least 45 mins of exercise without increased back and pelvic pain Baseline:  Goal status: met    LONG TERM GOALS: Target date: 02/06/2024    Pt will be independent with advanced HEP.   Baseline:  Goal status: progressing  2.  Pt will report max 2/10 pelvic pain in order to have better quality of life Baseline:  Goal status: progressing 2-3/10 on 01/13/2024  3.  Pt will soak 0 pads/ day in order to participate in community and church without embarassement Baseline: 2-3 Goal status: progressing  4.  Pt will report 0 bladder spasms in 24 hrs  Baseline:  Goal status: progressing  5.  Pt will get up 1 time/ night to urinate in order to have increased energy during the day Baseline:  Goal status: progressing    PLAN:  PT FREQUENCY: 1-2x/week  PT DURATION: 12 weeks  PLANNED INTERVENTIONS:  97110-Therapeutic exercises, 97530- Therapeutic activity, 97112- Neuromuscular re-education, 97535- Self Care, [id number]- Manual therapy, [phone]- Electrical stimulation (manual), Taping, Dry Needling, Joint mobilization, Joint manipulation, Spinal manipulation, Spinal mobilization, Scar mobilization, Cryotherapy, Moist heat, and Biofeedback  PLAN FOR NEXT SESSION:  work on urinating every 2 hours, back strength, hip ROM, back extension, Foam roll  Arshawn Valdez, PT 01/19/24 1:16 PM

## 2024-01-23 ENCOUNTER — Ambulatory Visit: Admitting: Physical Therapy

## 2024-01-23 ENCOUNTER — Encounter: Payer: Self-pay | Admitting: Physical Therapy

## 2024-01-23 DIAGNOSIS — R102 Pelvic and perineal pain: Secondary | ICD-10-CM | POA: Diagnosis not present

## 2024-01-23 DIAGNOSIS — M6281 Muscle weakness (generalized): Secondary | ICD-10-CM | POA: Diagnosis not present

## 2024-01-23 DIAGNOSIS — R252 Cramp and spasm: Secondary | ICD-10-CM

## 2024-01-23 DIAGNOSIS — R293 Abnormal posture: Secondary | ICD-10-CM

## 2024-01-23 NOTE — Therapy (Signed)
 OUTPATIENT PHYSICAL THERAPY MALE PELVIC TREATMENT   Patient Name: Ryan [name] MRN: [id number] DOB:1945-08-13, 78 y.o., male Today's Date: 01/23/2024  END OF SESSION:  PT End of Session - 01/23/24 1400     Visit Number 12    Date for PT Re-Evaluation 02/14/24    Authorization Type MEDICARE PART A AND B    Authorization - Visit Number 11    Authorization - Number of Visits 20    PT Start Time 1400    PT Stop Time 1440    PT Time Calculation (min) 40 min    Activity Tolerance Patient tolerated treatment well    Behavior During Therapy WFL for tasks assessed/performed                 Past Medical History:  Diagnosis Date   Arthritis 03/28/2013   Back pain    Cancer (HCC) 2014   prostate cancer   Cataracts, bilateral    Dysrhythmia    occasional PAC- asymptomatic, per MD pt cut back on caffeine   ED (erectile dysfunction) 12/18/2021   due to arterial insufficency   Fungal nail infection    GERD (gastroesophageal reflux disease)    Hearing loss    wear hearing aids   HSV infection 2016   Hyperlipidemia    Hypertension    no meds, dx yrs ago per patient but normal last several yrs   Hypertensive retinopathy    OU   Hypothyroidism    Personal history of other diseases of the digestive system    Personal history of other diseases of the nervous system and sense organs 2014   Personal history of other endocrine, nutritional and metabolic disease 7985   Pneumonia    x 1   Primary testicular hypogonadism 2017   Retarded ejaculation 2021   Sleep apnea 2014   history of, did not qualify for cpap   Past Surgical History:  Procedure Laterality Date   ankle surgery trauma Right    CATARACT EXTRACTION Left 11/29/2018   Dr. Roz   COLONOSCOPY     COSMETIC SURGERY     loose skin removed around neck area   CYSTOSCOPY N/A 01/31/2023   Procedure: CYSTOSCOPY;  Surgeon: Selma Donnice SAUNDERS, MD;  Location: [facility];  Service: Urology;  Laterality:  N/A;   eye lid surgery     EYE SURGERY     fungal nail     LASIK Bilateral    PARS PLANA VITRECTOMY Left 03/01/2019   Procedure: PARS PLANA VITRECTOMY WITH 25 GAUGE;  Surgeon: Valdemar Rogue, MD;  Location: [facility];  Service: Ophthalmology;  Laterality: Left;   PROSTATE BIOPSY  06/21/2022   2020, 2018   RADIOACTIVE SEED IMPLANT N/A 01/31/2023   Procedure: RADIOACTIVE SEED IMPLANT/BRACHYTHERAPY IMPLANT;  Surgeon: Selma Donnice SAUNDERS, MD;  Location: [facility];  Service: Urology;  Laterality: N/A;  90 MINUTES NEEDED FOR CASE   REMOVAL RETAINED LENS Left 03/01/2019   Procedure: REMOVAL OF RETAINED LENS MATERIALS LEFT EYE;  Surgeon: Valdemar Rogue, MD;  Location: [facility] OR;  Service: Ophthalmology;  Laterality: Left;   SPACE OAR INSTILLATION N/A 01/31/2023   Procedure: SPACE OAR INSTILLATION;  Surgeon: Selma Donnice SAUNDERS, MD;  Location: [facility];  Service: Urology;  Laterality: N/A;   VASECTOMY  1981   WISDOM TOOTH EXTRACTION     Patient Active Problem List   Diagnosis Date Noted   Acute cystitis with hematuria 08/08/2023   Generalized weakness 08/08/2023  AKI (acute kidney injury) (HCC) 08/04/2023   Complicated UTI (urinary tract infection) 08/03/2023   History of herpes genitalis 09/16/2020   Cataracts, bilateral 09/05/2018   Incomplete right bundle branch block (RBBB) 09/05/2018   Prostate cancer (HCC) 02/14/2014   Hypogonadism male 06/10/2011   PROSTATE SPECIFIC ANTIGEN, ELEVATED 07/13/2010   Hypothyroidism 05/27/2008   CARCINOMA, BASAL CELL, NOSE 10/05/2007   ERECTILE DYSFUNCTION, MILD 10/05/2007   LOSS, MIXED HEARING, BILATERAL 02/01/2007   BACK PAIN 02/01/2007   Hyperlipidemia 01/20/2007   Essential hypertension 01/20/2007    PCP: Micheal Wolm ORN, MD   REFERRING PROVIDER: Micheal Wolm ORN, MD  REFERRING DIAG: M62.89 (ICD-10-CM) - Pelvic floor instability Bladder spasms +1 more   THERAPY DIAG:  Cramp and spasm  Muscle weakness  (generalized)  Abnormal posture  Pelvic pain  Rationale for Evaluation and Treatment: Rehabilitation  ONSET DATE: 01/2024  SUBJECTIVE:                                                                                                                                                                                           SUBJECTIVE STATEMENT: I have not had a bowel movement today. Tried to not use the catheter one night and had to get up in 2 hours. I am not emptying on his own. Last night I got more urine out  compared to the catheter. I get the urge to urinate when I stand up. Patient can hold the urine 2-2.5 hours. Sometimes maybe 3 hours. I am still wearing 1 pad per day. There is a small drops of urine. He had 192 cc of urine left in bladder after he urinated with urine output test. Cystoscopy is October 28th. When first start to urinate he has penis pain and as urine comes out it decreases.   PAIN:  Are you having pain? Yes NPRS scale: up to 10 /10 11/18/23; pain level 6/10 12/09/23: pain level 6-7/10  Pain location: radiates to tip of penis, burns when he urinates Pain type: burning Pain description: constant , without spasm less pain  Aggravating factors: spasm, urination Relieving factors: not having spasm  PRECAUTIONS: None  RED FLAGS: Bowel or bladder incontinence: Yes:     WEIGHT BEARING RESTRICTIONS: No  FALLS:  Has patient fallen in last 6 months? No  LIVING ENVIRONMENT: Lives with: wifelives with their spouse Lives in: House/apartment Stairs: No Has following equipment at home: treatmillno  OCCUPATION: retired  PLOF: Independent  PATIENT GOALS: to be continent and less spasm  PERTINENT HISTORY:  Prostate cancer Low back ablations Sexual abuse: no  BOWEL MOVEMENT: medications- some make him constipated and some give him  diarrhea  URINATION: Pain with urination: Yes Fully empty bladder: No Stream: Weak- trickle Urgency: Yes: - when he has a  spasm Frequency: yes- late aftrenoon- very 15-20 mins 12/12/23: can wait 1 to 1.5 hours to urinate.  01/23/24: wait 2-2.5 hours Leakage: Urge to void and Walking to the bathroom Pads: Yes: 2-3/24 hrs 12/12/23: 1 pad during the day and 1 at night.   INTERCOURSE: has not tried- was told he would never be able to do at  OBJECTIVE:  Note: Objective measures were completed at Evaluation unless otherwise noted.  Pudendal nerve goes to the penis  PATIENT SURVEYS:  PFIQ-7 67  COGNITION: Overall cognitive status: Within functional limits for tasks assessed     SENSATION: Light touch: Appears intact Proprioception: Appears intact  MUSCLE LENGTH: Hamstrings: Right 70 deg; Left 70 deg LUMBAR SPECIAL TESTS:  Straight leg raise test: Positive for HS tightness    POSTURE: rounded shoulders, forward head, increased thoracic kyphosis, and flexed trunk   PELVIC ALIGNMENT: even  LUMBARAROM/PROM: stiffness throughout other than flexion  A/PROM A/PROM  eval  Flexion 90%   LOWER EXTREMITY AROM/PROM: limited bilateral hip ER and IR and left hip flexion reproduces  pain into tip of penis   LOWER EXTREMITY MMT: 4/5 grossly throughout PALPATION: GENERAL abdominal trigger points and tightness reproduced tip of penis pain              External Perineal Exam- tight and tender bilateral bulbospongiosus              Internal Pelvic Floor mild tenderness throughout internal and external anal sphincter Patient confirms identification and approves PT to assess internal pelvic floor and treatment Yes  PELVIC MMT:   MMT eval 12/12/23  Internal Anal Sphincter 4/5 5/5  External Anal Sphincter 4/5 5/5  Puborectalis  5/5  Diastasis Recti 1 -1-1 finger, no doming   (Blank rows = not tested)  TONE: high  TODAY'S TREATMENT: 01/23/24 Manual: Soft tissue mobilization: Release of the urogenital diaphragm going through the layers and no tenderness.  Myofascial release: Fascial release around the  round ligament bilaterally Fascial release of the mons pubis and urethra together Fascial release of the area around the bladder.  Self-care: Educated patient on how to use a penile pump. Different places he is to purchase one, what to look for in a pump     01/19/2024 Neuro reed- open books with foam roller, child's pose 1 min                     Foam roller stretch from child's pose 10 reps                    Seated back stretches and side stretch 10 reps with foam roller Cat/ cow 10 reps with diaphragmatic breathing Figure four stretch bilateral 5 reps with DB                     Manual- rectal work for pelvic floor release  There acts- review of progress, HEP, self cathing      01/13/2024 Patient confirms identification and approves physical therapist to perform internal soft tissue work    Neuro reeducation-open books 10 reps bilateral                                 Diaphragmatic breathing is supine and sidelying  Manual therapy- rectal pelvic  floor mobilization, pelvic floor release with diaphragmatic breathing    Therapeutic activities- education on bladder diary, bladder irritants, weaning of catheter strategies, progress review and goals     PATIENT EDUCATION/ there acts  12/12/23 Education details:  Access Code: [email or web address], educated on how to relax pelvic floor to urinate, sit to stand with upright posture Person educated: Patient and Spouse Education method: Explanation, Demonstration, Tactile cues, Verbal cues, and Handouts Education comprehension: verbalized understanding, returned demonstration, verbal cues required, tactile cues required, and needs further education  HOME EXERCISE PROGRAM: 12/12/23 Access Code: [email or web address] URL: [email or web address] Date: 12/12/2023 Prepared by: Channing Pereyra  Exercises - Supine Diaphragmatic Breathing  - 1 x daily - 7 x weekly - 3 sets - 10 reps - Seated Abdominal Press into [employer]  - 1 x daily - 7 x weekly - 1  sets - 10 reps - Seated Shoulder Diagonal Pulls with Resistance  - 1 x daily - 7 x weekly - 3 sets - 10 reps - Sidelying Thoracic Rotation with Open Book  - 1 x daily - 7 x weekly - 1 sets - 10 reps - Seated Piriformis Stretch with Trunk Bend  - 1 x daily - 7 x weekly - 1 sets - 2 reps - 30 sec hold - Seated Hamstring Stretch  - 1 x daily - 7 x weekly - 1 sets - 2 reps - 30 sec hold  Patient Education - Urinary Urge Control Techniques - Urinary Urge Control Techniques ASSESSMENT:  CLINICAL IMPRESSION:  Pt progressing well towards his goals. Reports that he has more control and less spasms.  Patient has a few drops of urine on his pads. She does not have pain in the perineal body, Hs pelvic floor is tighter on the left. He understands where to look for a penile pump and how to use it. He is still having trouble emptying his bladder. I is able to wait 2-2.5 hours to urinate. He tried to not use the catheter prior to bed but woke up in 2 hours to urinate. He gets the urge to urinate when he stand up. He will continue to benefit from PT to work on pelvic floor relaxation, coordination, reduction of bladder spasms and improve quality of life.         OBJECTIVE IMPAIRMENTS: Abnormal gait, decreased activity tolerance, decreased coordination, decreased endurance, difficulty walking, decreased ROM, decreased strength, increased fascial restrictions, increased muscle spasms, impaired UE functional use, and pain.   ACTIVITY LIMITATIONS: continence and toileting  PARTICIPATION LIMITATIONS: interpersonal relationship and community activity  PERSONAL FACTORS: Age, Fitness, and Time since onset of injury/illness/exacerbation are also affecting patient's functional outcome.   REHAB POTENTIAL: Good  CLINICAL DECISION MAKING: Evolving/moderate complexity  EVALUATION COMPLEXITY: Moderate   GOALS: Goals reviewed with patient? Yes  SHORT TERM GOALS: Target date: 12/12/2023    Pt will be  independent with HEP.   Baseline: Goal status: Met 12/12/23  2. Pt will be independent with the knack, urge suppression technique, and double voiding in order to improve bladder habits and decrease urinary incontinence.   Baseline:  Goal status: progressing  3.  Pt will be independent with use of squatty potty, relaxed toileting mechanics, and improved bowel movement techniques in order to increase ease of bowel movements and complete evacuation.   Baseline:  Goal status: progressing  4.  Pt will demonstrate at least 45 mins of exercise without increased back and pelvic pain Baseline:  Goal status: met    LONG TERM GOALS:  Target date: 02/06/2024    Pt will be independent with advanced HEP.   Baseline:  Goal status: progressing  2.  Pt will report max 2/10 pelvic pain in order to have better quality of life Baseline:  Goal status: progressing 2-3/10 on 01/13/2024  3.  Pt will soak 0 pads/ day in order to participate in community and church without embarassement Baseline: 2-3 Goal status: progressing  4.  Pt will report 0 bladder spasms in 24 hrs  Baseline:  Goal status: progressing  5.  Pt will get up 1 time/ night to urinate in order to have increased energy during the day Baseline:  Goal status: progressing    PLAN:  PT FREQUENCY: 1-2x/week  PT DURATION: 12 weeks  PLANNED INTERVENTIONS: 97110-Therapeutic exercises, 97530- Therapeutic activity, 97112- Neuromuscular re-education, 97535- Self Care, [id number]- Manual therapy, 97032- Electrical stimulation (manual), Taping, Dry Needling, Joint mobilization, Joint manipulation, Spinal manipulation, Spinal mobilization, Scar mobilization, Cryotherapy, Moist heat, and Biofeedback  PLAN FOR NEXT SESSION:  work on urachus ligament, sit to stand with urge, see how the pump is going  Channing Pereyra, PT 01/23/24 2:51 PM

## 2024-01-23 NOTE — Patient Instructions (Signed)
Penile Pump Protocol Prior to placing pump on penis, retract the foreskin and shave the pubic hair Place water based lubricant only on the ring.  Place pump over the penis to the base.  Use slow gentle compressions till erection Maintain erection for 5 seconds then release, So for 10-20 erections for 10 min per day for 4 weeks Then progress to  3 minutes on, 1 minute off, 3 repetitions of this sequence for 3 times per week up to 12 months For post-prostatectomy, use as early as 4 weeks post-operatively, up to 12 months  Device may be covered by insurance if billed for "penile rehabilitation"  The device is to stretch and increase strength of penis You Tube vides "Physiotherapy Penile Rehab for Erectile Dysfunction ( use of vacuum pump)  Types of devices Penile pumps  Vacurect Vacuum Therapy Erectile Dysfunction Device  by Vacurect Can get on Amazon  Active Erection System [email or web address]     [facility] [address], [address] [place], [place] [address] Phone # [phone] Fax [phone]  

## 2024-01-24 ENCOUNTER — Ambulatory Visit: Admitting: Physical Therapy

## 2024-01-26 ENCOUNTER — Ambulatory Visit: Admitting: Physical Therapy

## 2024-01-27 ENCOUNTER — Ambulatory Visit: Admitting: Physical Therapy

## 2024-01-27 ENCOUNTER — Encounter: Payer: Self-pay | Admitting: Physical Therapy

## 2024-01-27 DIAGNOSIS — R293 Abnormal posture: Secondary | ICD-10-CM | POA: Diagnosis not present

## 2024-01-27 DIAGNOSIS — R102 Pelvic and perineal pain: Secondary | ICD-10-CM | POA: Diagnosis not present

## 2024-01-27 DIAGNOSIS — R252 Cramp and spasm: Secondary | ICD-10-CM | POA: Diagnosis not present

## 2024-01-27 DIAGNOSIS — M6281 Muscle weakness (generalized): Secondary | ICD-10-CM

## 2024-01-27 NOTE — Therapy (Signed)
 OUTPATIENT PHYSICAL THERAPY MALE PELVIC TREATMENT   Patient Name: Ryan [name] MRN: [id number] DOB:1946/01/12, 78 y.o., male Today's Date: 01/27/2024  END OF SESSION:  PT End of Session - 01/27/24 0843     Visit Number 13    Date for PT Re-Evaluation 02/14/24    Authorization Type MEDICARE PART A AND B    Authorization - Visit Number 12    PT Start Time [phone]    PT Stop Time 0925    PT Time Calculation (min) 43 min    Activity Tolerance Patient tolerated treatment well    Behavior During Therapy [facility] for tasks assessed/performed                  Past Medical History:  Diagnosis Date   Arthritis 03/28/2013   Back pain    Cancer (HCC) 2014   prostate cancer   Cataracts, bilateral    Dysrhythmia    occasional PAC- asymptomatic, per MD pt cut back on caffeine   ED (erectile dysfunction) 12/18/2021   due to arterial insufficency   Fungal nail infection    GERD (gastroesophageal reflux disease)    Hearing loss    wear hearing aids   HSV infection 2016   Hyperlipidemia    Hypertension    no meds, dx yrs ago per patient but normal last several yrs   Hypertensive retinopathy    OU   Hypothyroidism    Personal history of other diseases of the digestive system    Personal history of other diseases of the nervous system and sense organs 2014   Personal history of other endocrine, nutritional and metabolic disease 7985   Pneumonia    x 1   Primary testicular hypogonadism 2017   Retarded ejaculation 2021   Sleep apnea 2014   history of, did not qualify for cpap   Past Surgical History:  Procedure Laterality Date   ankle surgery trauma Right    CATARACT EXTRACTION Left 11/29/2018   Dr. Roz   COLONOSCOPY     COSMETIC SURGERY     loose skin removed around neck area   CYSTOSCOPY N/A 01/31/2023   Procedure: CYSTOSCOPY;  Surgeon: Selma Donnice SAUNDERS, MD;  Location: [facility];  Service: Urology;  Laterality: N/A;   eye lid surgery     EYE  SURGERY     fungal nail     LASIK Bilateral    PARS PLANA VITRECTOMY Left 03/01/2019   Procedure: PARS PLANA VITRECTOMY WITH 25 GAUGE;  Surgeon: Valdemar Rogue, MD;  Location: [facility] OR;  Service: Ophthalmology;  Laterality: Left;   PROSTATE BIOPSY  06/21/2022   2020, 2018   RADIOACTIVE SEED IMPLANT N/A 01/31/2023   Procedure: RADIOACTIVE SEED IMPLANT/BRACHYTHERAPY IMPLANT;  Surgeon: Selma Donnice SAUNDERS, MD;  Location: [facility];  Service: Urology;  Laterality: N/A;  90 MINUTES NEEDED FOR CASE   REMOVAL RETAINED LENS Left 03/01/2019   Procedure: REMOVAL OF RETAINED LENS MATERIALS LEFT EYE;  Surgeon: Valdemar Rogue, MD;  Location: [facility] OR;  Service: Ophthalmology;  Laterality: Left;   SPACE OAR INSTILLATION N/A 01/31/2023   Procedure: SPACE OAR INSTILLATION;  Surgeon: Selma Donnice SAUNDERS, MD;  Location: [facility];  Service: Urology;  Laterality: N/A;   VASECTOMY  1981   WISDOM TOOTH EXTRACTION     Patient Active Problem List   Diagnosis Date Noted   Acute cystitis with hematuria 08/08/2023   Generalized weakness 08/08/2023   AKI (acute kidney injury) (HCC) 08/04/2023  Complicated UTI (urinary tract infection) 08/03/2023   History of herpes genitalis 09/16/2020   Cataracts, bilateral 09/05/2018   Incomplete right bundle branch block (RBBB) 09/05/2018   Prostate cancer (HCC) 02/14/2014   Hypogonadism male 06/10/2011   PROSTATE SPECIFIC ANTIGEN, ELEVATED 07/13/2010   Hypothyroidism 05/27/2008   CARCINOMA, BASAL CELL, NOSE 10/05/2007   ERECTILE DYSFUNCTION, MILD 10/05/2007   LOSS, MIXED HEARING, BILATERAL 02/01/2007   BACK PAIN 02/01/2007   Hyperlipidemia 01/20/2007   Essential hypertension 01/20/2007    PCP: Micheal Wolm ORN, MD   REFERRING PROVIDER: Micheal Wolm ORN, MD  REFERRING DIAG: M62.89 (ICD-10-CM) - Pelvic floor instability Bladder spasms +1 more   THERAPY DIAG:  Cramp and spasm  Abnormal posture  Muscle weakness (generalized)  Pelvic  pain  Rationale for Evaluation and Treatment: Rehabilitation  ONSET DATE: 01/2024  SUBJECTIVE:                                                                                                                                                                                           SUBJECTIVE STATEMENT: Patient reports that he has been weaning himself off catheter in the evenings and has been able to sleep up to 6 hrs when he was tired and took Aleve. Before that it was 2-3 hours in a stretch.  Patient reports that he found a pump on [employer] for $35. He used it, it was a little messy to use the pump with a lubricant. He used it a few times. He is using the largest part.  He has had no spasms, does not want rectal work since his wife is not here today Urinates sitting down at home and standing up when he is out      Last visit I have not had a bowel movement today. Tried to not use the catheter one night and had to get up in 2 hours. I am not emptying on his own. Last night I got more urine out  compared to the catheter. I get the urge to urinate when I stand up. Patient can hold the urine 2-2.5 hours. Sometimes maybe 3 hours. I am still wearing 1 pad per day. There is a small drops of urine. He had 192 cc of urine left in bladder after he urinated with urine output test. Cystoscopy is October 28th. When first start to urinate he has penis pain and as urine comes out it decreases.   PAIN:  Are you having pain? Yes NPRS scale: up to 10 /10 11/18/23; pain level 6/10 12/09/23: pain level 6-7/10  Pain location: radiates to tip of penis, burns when he urinates Pain type: burning  Pain description: constant , without spasm less pain  Aggravating factors: spasm, urination Relieving factors: not having spasm  PRECAUTIONS: None  RED FLAGS: Bowel or bladder incontinence: Yes:     WEIGHT BEARING RESTRICTIONS: No  FALLS:  Has patient fallen in last 6 months? No  LIVING ENVIRONMENT: Lives with:  wifelives with their spouse Lives in: House/apartment Stairs: No Has following equipment at home: treatmillno  OCCUPATION: retired  PLOF: Independent  PATIENT GOALS: to be continent and less spasm  PERTINENT HISTORY:  Prostate cancer Low back ablations Sexual abuse: no  BOWEL MOVEMENT: medications- some make him constipated and some give him diarrhea  URINATION: Pain with urination: Yes Fully empty bladder: No Stream: Weak- trickle Urgency: Yes: - when he has a spasm Frequency: yes- late aftrenoon- very 15-20 mins 12/12/23: can wait 1 to 1.5 hours to urinate.  01/23/24: wait 2-2.5 hours Leakage: Urge to void and Walking to the bathroom Pads: Yes: 2-3/24 hrs 12/12/23: 1 pad during the day and 1 at night.   INTERCOURSE: has not tried- was told he would never be able to do at  OBJECTIVE:  Note: Objective measures were completed at Evaluation unless otherwise noted.  Pudendal nerve goes to the penis  PATIENT SURVEYS:  PFIQ-7 67  COGNITION: Overall cognitive status: Within functional limits for tasks assessed     SENSATION: Light touch: Appears intact Proprioception: Appears intact  MUSCLE LENGTH: Hamstrings: Right 70 deg; Left 70 deg LUMBAR SPECIAL TESTS:  Straight leg raise test: Positive for HS tightness    POSTURE: rounded shoulders, forward head, increased thoracic kyphosis, and flexed trunk   PELVIC ALIGNMENT: even  LUMBARAROM/PROM: stiffness throughout other than flexion  A/PROM A/PROM  eval  Flexion 90%   LOWER EXTREMITY AROM/PROM: limited bilateral hip ER and IR and left hip flexion reproduces  pain into tip of penis   LOWER EXTREMITY MMT: 4/5 grossly throughout PALPATION: GENERAL abdominal trigger points and tightness reproduced tip of penis pain              External Perineal Exam- tight and tender bilateral bulbospongiosus              Internal Pelvic Floor mild tenderness throughout internal and external anal sphincter Patient confirms  identification and approves PT to assess internal pelvic floor and treatment Yes  PELVIC MMT:   MMT eval 12/12/23  Internal Anal Sphincter 4/5 5/5  External Anal Sphincter 4/5 5/5  Puborectalis  5/5  Diastasis Recti 1 -1-1 finger, no doming   (Blank rows = not tested)  TONE: high  TODAY'S TREATMENT: 01/27/2024 There acts- review of HEP, progress Neurom reed- diaphragmatic breathing with TC's, VC's, Trial of pelvic floor exercise- hip adduction with ball with transverse abdominis breath and horizontal abduction  with green and red thera band -10 reps- tiring Hip adduction with block 10 reps with transverse abdominis breath supine  Seated hip adduction with yoga block with transverse abdominis breath 10 reps  Seated hamstring stretch bilateral 2 reps Child's pose with diaphragmatic breathing    01/23/24 Manual: Soft tissue mobilization: Release of the urogenital diaphragm going through the layers and no tenderness.  Myofascial release: Fascial release around the round ligament bilaterally Fascial release of the mons pubis and urethra together Fascial release of the area around the bladder.  Self-care: Educated patient on how to use a penile pump. Different places he is to purchase one, what to look for in a pump     01/19/2024 Neuro reed- open books with  foam roller, child's pose 1 min                     Foam roller stretch from child's pose 10 reps                    Seated back stretches and side stretch 10 reps with foam roller Cat/ cow 10 reps with diaphragmatic breathing Figure four stretch bilateral 5 reps with DB                     Manual- rectal work for pelvic floor release  There acts- review of progress, HEP, self cathing      01/13/2024 Patient confirms identification and approves physical therapist to perform internal soft tissue work    Neuro reeducation-open books 10 reps bilateral                                 Diaphragmatic breathing is supine and  sidelying  Manual therapy- rectal pelvic floor mobilization, pelvic floor release with diaphragmatic breathing    Therapeutic activities- education on bladder diary, bladder irritants, weaning of catheter strategies, progress review and goals     PATIENT EDUCATION/ there acts  12/12/23 Education details:  Access Code: [email or web address], educated on how to relax pelvic floor to urinate, sit to stand with upright posture Person educated: Patient and Spouse Education method: Explanation, Demonstration, Tactile cues, Verbal cues, and Handouts Education comprehension: verbalized understanding, returned demonstration, verbal cues required, tactile cues required, and needs further education  HOME EXERCISE PROGRAM: 12/12/23 Access Code: [email or web address] URL: [email or web address] Date: 12/12/2023 Prepared by: Channing Pereyra  Exercises - Supine Diaphragmatic Breathing  - 1 x daily - 7 x weekly - 3 sets - 10 reps - Seated Abdominal Press into [employer]  - 1 x daily - 7 x weekly - 1 sets - 10 reps - Seated Shoulder Diagonal Pulls with Resistance  - 1 x daily - 7 x weekly - 3 sets - 10 reps - Sidelying Thoracic Rotation with Open Book  - 1 x daily - 7 x weekly - 1 sets - 10 reps - Seated Piriformis Stretch with Trunk Bend  - 1 x daily - 7 x weekly - 1 sets - 2 reps - 30 sec hold - Seated Hamstring Stretch  - 1 x daily - 7 x weekly - 1 sets - 2 reps - 30 sec hold  Patient Education - Urinary Urge Control Techniques - Urinary Urge Control Techniques ASSESSMENT:  CLINICAL IMPRESSION:  Pt progressing well towards his goals. Reports that he has more control and less spasms.  He still wears a pad just in case but does not leak. Tx focus on pelvic floor activation and stretches, reviewed use of pump. Patient with poor posture, educated on neutral posture for improved relationship with respiratory diaphragm and pelvic floor. Patient with good tolerance, fatigued a little. He He will continue to benefit from  PT to work on pelvic floor relaxation, coordination, reduction of bladder spasms and improve quality of life.         OBJECTIVE IMPAIRMENTS: Abnormal gait, decreased activity tolerance, decreased coordination, decreased endurance, difficulty walking, decreased ROM, decreased strength, increased fascial restrictions, increased muscle spasms, impaired UE functional use, and pain.   ACTIVITY LIMITATIONS: continence and toileting  PARTICIPATION LIMITATIONS: interpersonal relationship and community activity  PERSONAL FACTORS: Age, Fitness, and Time since onset of injury/illness/exacerbation are also  affecting patient's functional outcome.   REHAB POTENTIAL: Good  CLINICAL DECISION MAKING: Evolving/moderate complexity  EVALUATION COMPLEXITY: Moderate   GOALS: Goals reviewed with patient? Yes  SHORT TERM GOALS: Target date: 12/12/2023    Pt will be independent with HEP.   Baseline: Goal status: Met 12/12/23  2. Pt will be independent with the knack, urge suppression technique, and double voiding in order to improve bladder habits and decrease urinary incontinence.   Baseline:  Goal status: progressing  3.  Pt will be independent with use of squatty potty, relaxed toileting mechanics, and improved bowel movement techniques in order to increase ease of bowel movements and complete evacuation.   Baseline:  Goal status: progressing  4.  Pt will demonstrate at least 45 mins of exercise without increased back and pelvic pain Baseline:  Goal status: met    LONG TERM GOALS: Target date: 02/06/2024    Pt will be independent with advanced HEP.   Baseline:  Goal status: progressing  2.  Pt will report max 2/10 pelvic pain in order to have better quality of life Baseline:  Goal status: progressing 2-3/10 on 01/13/2024  3.  Pt will soak 0 pads/ day in order to participate in community and church without embarassement Baseline: 2-3 Goal status: progressing  4.  Pt will report 0  bladder spasms in 24 hrs  Baseline:  Goal status: progressing  5.  Pt will get up 1 time/ night to urinate in order to have increased energy during the day Baseline:  Goal status: progressing    PLAN:  PT FREQUENCY: 1-2x/week  PT DURATION: 12 weeks  PLANNED INTERVENTIONS: 97110-Therapeutic exercises, 97530- Therapeutic activity, 97112- Neuromuscular re-education, 97535- Self Care, [id number]- Manual therapy, 97032- Electrical stimulation (manual), Taping, Dry Needling, Joint mobilization, Joint manipulation, Spinal manipulation, Spinal mobilization, Scar mobilization, Cryotherapy, Moist heat, and Biofeedback  PLAN FOR NEXT SESSION:  work on urachus ligament, sit to stand with urge, see how the pump is going  Lamija Besse, PT 01/27/24 8:44 AM

## 2024-01-31 ENCOUNTER — Encounter: Admitting: Physical Therapy

## 2024-02-02 ENCOUNTER — Encounter: Payer: Self-pay | Admitting: Physical Therapy

## 2024-02-02 ENCOUNTER — Ambulatory Visit: Admitting: Physical Therapy

## 2024-02-02 DIAGNOSIS — R252 Cramp and spasm: Secondary | ICD-10-CM | POA: Diagnosis not present

## 2024-02-02 DIAGNOSIS — R102 Pelvic and perineal pain: Secondary | ICD-10-CM

## 2024-02-02 DIAGNOSIS — M6281 Muscle weakness (generalized): Secondary | ICD-10-CM | POA: Diagnosis not present

## 2024-02-02 DIAGNOSIS — R293 Abnormal posture: Secondary | ICD-10-CM

## 2024-02-02 NOTE — Therapy (Addendum)
 OUTPATIENT PHYSICAL THERAPY MALE PELVIC TREATMENT   Patient Name: Ryan [name] MRN: [id number] DOB:06/14/1946, 78 y.o., male Today's Date: 02/02/2024  END OF SESSION:  PT End of Session - 02/02/24 1236     Visit Number 14    Date for PT Re-Evaluation 02/14/24    Authorization Type MEDICARE PART A AND B    Authorization - Visit Number 13    Authorization - Number of Visits 20    PT Start Time 1230    PT Stop Time 1315    PT Time Calculation (min) 45 min    Activity Tolerance Patient tolerated treatment well    Behavior During Therapy WFL for tasks assessed/performed                   Past Medical History:  Diagnosis Date   Arthritis 03/28/2013   Back pain    Cancer (HCC) 2014   prostate cancer   Cataracts, bilateral    Dysrhythmia    occasional PAC- asymptomatic, per MD pt cut back on caffeine   ED (erectile dysfunction) 12/18/2021   due to arterial insufficency   Fungal nail infection    GERD (gastroesophageal reflux disease)    Hearing loss    wear hearing aids   HSV infection 2016   Hyperlipidemia    Hypertension    no meds, dx yrs ago per patient but normal last several yrs   Hypertensive retinopathy    OU   Hypothyroidism    Personal history of other diseases of the digestive system    Personal history of other diseases of the nervous system and sense organs 2014   Personal history of other endocrine, nutritional and metabolic disease 7985   Pneumonia    x 1   Primary testicular hypogonadism 2017   Retarded ejaculation 2021   Sleep apnea 2014   history of, did not qualify for cpap   Past Surgical History:  Procedure Laterality Date   ankle surgery trauma Right    CATARACT EXTRACTION Left 11/29/2018   Dr. Roz   COLONOSCOPY     COSMETIC SURGERY     loose skin removed around neck area   CYSTOSCOPY N/A 01/31/2023   Procedure: CYSTOSCOPY;  Surgeon: Selma Donnice SAUNDERS, MD;  Location: [facility];  Service: Urology;   Laterality: N/A;   eye lid surgery     EYE SURGERY     fungal nail     LASIK Bilateral    PARS PLANA VITRECTOMY Left 03/01/2019   Procedure: PARS PLANA VITRECTOMY WITH 25 GAUGE;  Surgeon: Valdemar Rogue, MD;  Location: [facility] OR;  Service: Ophthalmology;  Laterality: Left;   PROSTATE BIOPSY  06/21/2022   2020, 2018   RADIOACTIVE SEED IMPLANT N/A 01/31/2023   Procedure: RADIOACTIVE SEED IMPLANT/BRACHYTHERAPY IMPLANT;  Surgeon: Selma Donnice SAUNDERS, MD;  Location: [facility];  Service: Urology;  Laterality: N/A;  90 MINUTES NEEDED FOR CASE   REMOVAL RETAINED LENS Left 03/01/2019   Procedure: REMOVAL OF RETAINED LENS MATERIALS LEFT EYE;  Surgeon: Valdemar Rogue, MD;  Location: [facility];  Service: Ophthalmology;  Laterality: Left;   SPACE OAR INSTILLATION N/A 01/31/2023   Procedure: SPACE OAR INSTILLATION;  Surgeon: Selma Donnice SAUNDERS, MD;  Location: [facility];  Service: Urology;  Laterality: N/A;   VASECTOMY  1981   WISDOM TOOTH EXTRACTION     Patient Active Problem List   Diagnosis Date Noted   Acute cystitis with hematuria 08/08/2023   Generalized weakness  08/08/2023   AKI (acute kidney injury) (HCC) 08/04/2023   Complicated UTI (urinary tract infection) 08/03/2023   History of herpes genitalis 09/16/2020   Cataracts, bilateral 09/05/2018   Incomplete right bundle branch block (RBBB) 09/05/2018   Prostate cancer (HCC) 02/14/2014   Hypogonadism male 06/10/2011   PROSTATE SPECIFIC ANTIGEN, ELEVATED 07/13/2010   Hypothyroidism 05/27/2008   CARCINOMA, BASAL CELL, NOSE 10/05/2007   ERECTILE DYSFUNCTION, MILD 10/05/2007   LOSS, MIXED HEARING, BILATERAL 02/01/2007   BACK PAIN 02/01/2007   Hyperlipidemia 01/20/2007   Essential hypertension 01/20/2007    PCP: Micheal Wolm ORN, MD   REFERRING PROVIDER: Micheal Wolm ORN, MD  REFERRING DIAG: M62.89 (ICD-10-CM) - Pelvic floor instability Bladder spasms +1 more   THERAPY DIAG:  Cramp and spasm  Muscle weakness  (generalized)  Abnormal posture  Pelvic pain  Rationale for Evaluation and Treatment: Rehabilitation  ONSET DATE: 01/2024  SUBJECTIVE:                                                                                                                                                                                           SUBJECTIVE STATEMENT: Slow process but better each day Did a lot of yard work and washed his truck, has been a years. He has had more energy Has not cathed for 2 weeks, has to get up 3 times at night With aleve he sleeps longer, has had chronic low back pain Has been drinking a lot of water .Working outside Using a pump- it is different, works to bring the blood flow in, doing second level for 15 mins.  Feels 93% percent improved Feels more of of a difference with rectal work Still has some urgency, no leaking Takes him 10 mins to empty bladder Has more flow  Awaiting radioablation for lumbar spine nerves    Last visit I have not had a bowel movement today. Tried to not use the catheter one night and had to get up in 2 hours. I am not emptying on his own. Last night I got more urine out  compared to the catheter. I get the urge to urinate when I stand up. Patient can hold the urine 2-2.5 hours. Sometimes maybe 3 hours. I am still wearing 1 pad per day. There is a small drops of urine. He had 192 cc of urine left in bladder after he urinated with urine output test. Cystoscopy is October 28th. When first start to urinate he has penis pain and as urine comes out it decreases.   PAIN:  Are you having pain? Yes NPRS scale: up to 10 /10 11/18/23; pain level 6/10 12/09/23: pain level 6-7/10  Pain location: radiates to tip of penis, burns when he urinates Pain type: burning Pain description: constant , without spasm less pain  Aggravating factors: spasm, urination Relieving factors: not having spasm  PRECAUTIONS: None  RED FLAGS: Bowel or bladder incontinence: Yes:      WEIGHT BEARING RESTRICTIONS: No  FALLS:  Has patient fallen in last 6 months? No  LIVING ENVIRONMENT: Lives with: wifelives with their spouse Lives in: House/apartment Stairs: No Has following equipment at home: treatmillno  OCCUPATION: retired  PLOF: Independent  PATIENT GOALS: to be continent and less spasm  PERTINENT HISTORY:  Prostate cancer Low back ablations Sexual abuse: no  BOWEL MOVEMENT: medications- some make him constipated and some give him diarrhea  URINATION: Pain with urination: Yes Fully empty bladder: No Stream: Weak- trickle Urgency: Yes: - when he has a spasm Frequency: yes- late aftrenoon- very 15-20 mins 12/12/23: can wait 1 to 1.5 hours to urinate.  01/23/24: wait 2-2.5 hours Leakage: Urge to void and Walking to the bathroom Pads: Yes: 2-3/24 hrs 12/12/23: 1 pad during the day and 1 at night.   INTERCOURSE: has not tried- was told he would never be able to do at  OBJECTIVE:  Note: Objective measures were completed at Evaluation unless otherwise noted.  Pudendal nerve goes to the penis  PATIENT SURVEYS:  PFIQ-7 67  COGNITION: Overall cognitive status: Within functional limits for tasks assessed     SENSATION: Light touch: Appears intact Proprioception: Appears intact  MUSCLE LENGTH: Hamstrings: Right 70 deg; Left 70 deg LUMBAR SPECIAL TESTS:  Straight leg raise test: Positive for HS tightness    POSTURE: rounded shoulders, forward head, increased thoracic kyphosis, and flexed trunk   PELVIC ALIGNMENT: even  LUMBARAROM/PROM: stiffness throughout other than flexion  A/PROM A/PROM  eval  Flexion 90%   LOWER EXTREMITY AROM/PROM: limited bilateral hip ER and IR and left hip flexion reproduces  pain into tip of penis   LOWER EXTREMITY MMT: 4/5 grossly throughout PALPATION: GENERAL abdominal trigger points and tightness reproduced tip of penis pain              External Perineal Exam- tight and tender bilateral  bulbospongiosus              Internal Pelvic Floor mild tenderness throughout internal and external anal sphincter Patient confirms identification and approves PT to assess internal pelvic floor and treatment Yes  PELVIC MMT:   MMT eval 12/12/23  Internal Anal Sphincter 4/5 5/5  External Anal Sphincter 4/5 5/5  Puborectalis  5/5  Diastasis Recti 1 -1-1 finger, no doming   (Blank rows = not tested)  TONE: high  TODAY'S TREATMENT: 02/02/2024 There acts- review of HEP, progress, goals Manual- rectal release Neuro reed- diaphragmatic breathing  Back extensions over foam roller Open books bilateral 10 reps with block between knees Prayer stretch 3 reps , left and right stretch 3 reps/ 3 breaths       01/27/2024 There acts- review of HEP, progress Neurom reed- diaphragmatic breathing with TC's, VC's, Trial of pelvic floor exercise- hip adduction with ball with transverse abdominis breath and horizontal abduction  with green and red thera band -10 reps- tiring Hip adduction with block 10 reps with transverse abdominis breath supine  Seated hip adduction with yoga block with transverse abdominis breath 10 reps  Seated hamstring stretch bilateral 2 reps Child's pose with diaphragmatic breathing    01/23/24 Manual: Soft tissue mobilization: Release of the urogenital diaphragm going through the layers and no  tenderness.  Myofascial release: Fascial release around the round ligament bilaterally Fascial release of the mons pubis and urethra together Fascial release of the area around the bladder.  Self-care: Educated patient on how to use a penile pump. Different places he is to purchase one, what to look for in a pump     01/19/2024 Neuro reed- open books with foam roller, child's pose 1 min                     Foam roller stretch from child's pose 10 reps                    Seated back stretches and side stretch 10 reps with foam roller Cat/ cow 10 reps with diaphragmatic  breathing Figure four stretch bilateral 5 reps with DB                     Manual- rectal work for pelvic floor release  There acts- review of progress, HEP, self cathing      01/13/2024 Patient confirms identification and approves physical therapist to perform internal soft tissue work    Neuro reeducation-open books 10 reps bilateral                                 Diaphragmatic breathing is supine and sidelying  Manual therapy- rectal pelvic floor mobilization, pelvic floor release with diaphragmatic breathing    Therapeutic activities- education on bladder diary, bladder irritants, weaning of catheter strategies, progress review and goals     PATIENT EDUCATION/ there acts  12/12/23 Education details:  Access Code: [email or web address], educated on how to relax pelvic floor to urinate, sit to stand with upright posture Person educated: Patient and Spouse Education method: Explanation, Demonstration, Tactile cues, Verbal cues, and Handouts Education comprehension: verbalized understanding, returned demonstration, verbal cues required, tactile cues required, and needs further education  HOME EXERCISE PROGRAM: 12/12/23 Access Code: [email or web address] URL: [email or web address] Date: 12/12/2023 Prepared by: Channing Pereyra  Exercises - Supine Diaphragmatic Breathing  - 1 x daily - 7 x weekly - 3 sets - 10 reps - Seated Abdominal Press into [employer]  - 1 x daily - 7 x weekly - 1 sets - 10 reps - Seated Shoulder Diagonal Pulls with Resistance  - 1 x daily - 7 x weekly - 3 sets - 10 reps - Sidelying Thoracic Rotation with Open Book  - 1 x daily - 7 x weekly - 1 sets - 10 reps - Seated Piriformis Stretch with Trunk Bend  - 1 x daily - 7 x weekly - 1 sets - 2 reps - 30 sec hold - Seated Hamstring Stretch  - 1 x daily - 7 x weekly - 1 sets - 2 reps - 30 sec hold  Patient Education - Urinary Urge Control Techniques - Urinary Urge Control Techniques ASSESSMENT:  CLINICAL IMPRESSION: Pt  progressing well towards his goals. Reports that he has more control and less spasms. Does not leak. Tx focus on pelvic floor stretches and back stretches reviewed use of pump. Patient with poor posture, educated on neutral posture for improved relationship with respiratory diaphragm and pelvic floor. Patient with good tolerance, fatigued a little. He will continue to benefit from PT to work on pelvic floor relaxation, coordination, reduction of bladder spasms and improve quality of life.         OBJECTIVE IMPAIRMENTS: Abnormal gait, decreased  activity tolerance, decreased coordination, decreased endurance, difficulty walking, decreased ROM, decreased strength, increased fascial restrictions, increased muscle spasms, impaired UE functional use, and pain.   ACTIVITY LIMITATIONS: continence and toileting  PARTICIPATION LIMITATIONS: interpersonal relationship and community activity  PERSONAL FACTORS: Age, Fitness, and Time since onset of injury/illness/exacerbation are also affecting patient's functional outcome.   REHAB POTENTIAL: Good  CLINICAL DECISION MAKING: Evolving/moderate complexity  EVALUATION COMPLEXITY: Moderate   GOALS: Goals reviewed with patient? Yes  SHORT TERM GOALS: Target date: 12/12/2023    Pt will be independent with HEP.   Baseline: Goal status: Met 12/12/23  2. Pt will be independent with the knack, urge suppression technique, and double voiding in order to improve bladder habits and decrease urinary incontinence.   Baseline:  Goal status: progressing  3.  Pt will be independent with use of squatty potty, relaxed toileting mechanics, and improved bowel movement techniques in order to increase ease of bowel movements and complete evacuation.   Baseline:  Goal status: progressing  4.  Pt will demonstrate at least 45 mins of exercise without increased back and pelvic pain Baseline:  Goal status: met    LONG TERM GOALS: Target date: 02/06/2024    Pt will be  independent with advanced HEP.   Baseline:  Goal status: progressing  2.  Pt will report max 2/10 pelvic pain in order to have better quality of life Baseline:  Goal status: progressing 1/10  3.  Pt will soak 0 pads/ day in order to participate in community and church without embarassement Baseline: 2-3 pads Goal status: met  4.  Pt will report 0 bladder spasms in 24 hrs  Baseline:  Goal status: met  5.  Pt will get up 1 time/ night to urinate in order to have increased energy during the day Baseline: 2-3 Goal status: progressing    PLAN:  PT FREQUENCY: 1-2x/week  PT DURATION: 12 weeks  PLANNED INTERVENTIONS: 97110-Therapeutic exercises, 97530- Therapeutic activity, 97112- Neuromuscular re-education, 97535- Self Care, [id number]- Manual therapy, 97032- Electrical stimulation (manual), Taping, Dry Needling, Joint mobilization, Joint manipulation, Spinal manipulation, Spinal mobilization, Scar mobilization, Cryotherapy, Moist heat, and Biofeedback  PLAN FOR NEXT SESSION:  work on urachus ligament, sit to stand with urge, see how the pump is going  Jazmyn Offner, PT 02/02/24 12:50 PM

## 2024-02-05 ENCOUNTER — Other Ambulatory Visit: Payer: Self-pay | Admitting: Cardiovascular Disease

## 2024-02-08 ENCOUNTER — Ambulatory Visit: Attending: Family Medicine | Admitting: Physical Therapy

## 2024-02-08 ENCOUNTER — Encounter: Payer: Self-pay | Admitting: Physical Therapy

## 2024-02-08 DIAGNOSIS — R102 Pelvic and perineal pain: Secondary | ICD-10-CM | POA: Insufficient documentation

## 2024-02-08 DIAGNOSIS — R293 Abnormal posture: Secondary | ICD-10-CM | POA: Diagnosis not present

## 2024-02-08 DIAGNOSIS — M6281 Muscle weakness (generalized): Secondary | ICD-10-CM | POA: Diagnosis not present

## 2024-02-08 DIAGNOSIS — R252 Cramp and spasm: Secondary | ICD-10-CM | POA: Diagnosis not present

## 2024-02-08 NOTE — Therapy (Signed)
 OUTPATIENT PHYSICAL THERAPY MALE PELVIC TREATMENT   Patient Name: Ryan [name] MRN: [id number] DOB:1945/11/04, 78 y.o., male Today's Date: 02/08/2024  END OF SESSION:  PT End of Session - 02/08/24 1532     Visit Number 15    Date for PT Re-Evaluation 02/14/24    Authorization - Visit Number 14    Authorization - Number of Visits 20    PT Start Time 1530    PT Stop Time 1610    PT Time Calculation (min) 40 min    Activity Tolerance Patient tolerated treatment well    Behavior During Therapy [facility] for tasks assessed/performed                   Past Medical History:  Diagnosis Date   Arthritis 03/28/2013   Back pain    Cancer (HCC) 2014   prostate cancer   Cataracts, bilateral    Dysrhythmia    occasional PAC- asymptomatic, per MD pt cut back on caffeine   ED (erectile dysfunction) 12/18/2021   due to arterial insufficency   Fungal nail infection    GERD (gastroesophageal reflux disease)    Hearing loss    wear hearing aids   HSV infection 2016   Hyperlipidemia    Hypertension    no meds, dx yrs ago per patient but normal last several yrs   Hypertensive retinopathy    OU   Hypothyroidism    Personal history of other diseases of the digestive system    Personal history of other diseases of the nervous system and sense organs 2014   Personal history of other endocrine, nutritional and metabolic disease 7985   Pneumonia    x 1   Primary testicular hypogonadism 2017   Retarded ejaculation 2021   Sleep apnea 2014   history of, did not qualify for cpap   Past Surgical History:  Procedure Laterality Date   ankle surgery trauma Right    CATARACT EXTRACTION Left 11/29/2018   Dr. Roz   COLONOSCOPY     COSMETIC SURGERY     loose skin removed around neck area   CYSTOSCOPY N/A 01/31/2023   Procedure: CYSTOSCOPY;  Surgeon: Selma Donnice SAUNDERS, MD;  Location: [facility];  Service: Urology;  Laterality: N/A;   eye lid surgery     EYE SURGERY      fungal nail     LASIK Bilateral    PARS PLANA VITRECTOMY Left 03/01/2019   Procedure: PARS PLANA VITRECTOMY WITH 25 GAUGE;  Surgeon: Valdemar Rogue, MD;  Location: [facility];  Service: Ophthalmology;  Laterality: Left;   PROSTATE BIOPSY  06/21/2022   2020, 2018   RADIOACTIVE SEED IMPLANT N/A 01/31/2023   Procedure: RADIOACTIVE SEED IMPLANT/BRACHYTHERAPY IMPLANT;  Surgeon: Selma Donnice SAUNDERS, MD;  Location: [facility];  Service: Urology;  Laterality: N/A;  90 MINUTES NEEDED FOR CASE   REMOVAL RETAINED LENS Left 03/01/2019   Procedure: REMOVAL OF RETAINED LENS MATERIALS LEFT EYE;  Surgeon: Valdemar Rogue, MD;  Location: [facility] OR;  Service: Ophthalmology;  Laterality: Left;   SPACE OAR INSTILLATION N/A 01/31/2023   Procedure: SPACE OAR INSTILLATION;  Surgeon: Selma Donnice SAUNDERS, MD;  Location: [facility];  Service: Urology;  Laterality: N/A;   VASECTOMY  1981   WISDOM TOOTH EXTRACTION     Patient Active Problem List   Diagnosis Date Noted   Acute cystitis with hematuria 08/08/2023   Generalized weakness 08/08/2023   AKI (acute kidney injury) (HCC) 08/04/2023  Complicated UTI (urinary tract infection) 08/03/2023   History of herpes genitalis 09/16/2020   Cataracts, bilateral 09/05/2018   Incomplete right bundle branch block (RBBB) 09/05/2018   Prostate cancer (HCC) 02/14/2014   Hypogonadism male 06/10/2011   PROSTATE SPECIFIC ANTIGEN, ELEVATED 07/13/2010   Hypothyroidism 05/27/2008   CARCINOMA, BASAL CELL, NOSE 10/05/2007   ERECTILE DYSFUNCTION, MILD 10/05/2007   LOSS, MIXED HEARING, BILATERAL 02/01/2007   BACK PAIN 02/01/2007   Hyperlipidemia 01/20/2007   Essential hypertension 01/20/2007    PCP: Micheal Wolm ORN, MD   REFERRING PROVIDER: Micheal Wolm ORN, MD  REFERRING DIAG: M62.89 (ICD-10-CM) - Pelvic floor instability Bladder spasms +1 more   THERAPY DIAG:  Cramp and spasm  Muscle weakness (generalized)  Abnormal posture  Pelvic  pain  Rationale for Evaluation and Treatment: Rehabilitation  ONSET DATE: 01/2024  SUBJECTIVE:                                                                                                                                                                                           SUBJECTIVE STATEMENT: I can control my urine. Occasionally has pain radiate down the penis 6 times per week. No spasms. I still wear a pad just in case. Has not has uncontrolled leakage in the past few weeks. No catherize  in 3 weeks. We got a pump. I have gone to the largest size of the pump.    PAIN:  Are you having pain? Yes NPRS scale: up to 10 /10 11/18/23; pain level 6/10 12/09/23: pain level 6-7/10  Pain location: radiates to tip of penis, burns when he urinates Pain type: burning Pain description: constant , without spasm less pain  Aggravating factors: spasm, urination Relieving factors: not having spasm  PRECAUTIONS: None  RED FLAGS: Bowel or bladder incontinence: Yes:     WEIGHT BEARING RESTRICTIONS: No  FALLS:  Has patient fallen in last 6 months? No  LIVING ENVIRONMENT: Lives with: wifelives with their spouse Lives in: House/apartment Stairs: No Has following equipment at home: treatmillno  OCCUPATION: retired  PLOF: Independent  PATIENT GOALS: to be continent and less spasm  PERTINENT HISTORY:  Prostate cancer Low back ablations Sexual abuse: no  BOWEL MOVEMENT: medications- some make him constipated and some give him diarrhea  URINATION: Pain with urination: Yes Fully empty bladder: No Stream: Weak- trickle Urgency: Yes: - when he has a spasm Frequency: yes- late aftrenoon- very 15-20 mins 12/12/23: can wait 1 to 1.5 hours to urinate.  01/23/24: wait 2-2.5 hours Leakage: Urge to void and Walking to the bathroom 02/08/24: no urinary leakage Pads: Yes: 2-3/24 hrs 12/12/23: 1 pad during the day  and 1 at night.  02/08/24; just in case and are dry.   INTERCOURSE: has not tried-  was told he would never be able to do at  OBJECTIVE:  Note: Objective measures were completed at Evaluation unless otherwise noted.  Pudendal nerve goes to the penis  PATIENT SURVEYS:  PFIQ-7 67 02/08/24: PFIQ-7 6  COGNITION: Overall cognitive status: Within functional limits for tasks assessed     SENSATION: Light touch: Appears intact Proprioception: Appears intact  MUSCLE LENGTH: Hamstrings: Right 70 deg; Left 70 deg LUMBAR SPECIAL TESTS:  Straight leg raise test: Positive for HS tightness    POSTURE: rounded shoulders, forward head, increased thoracic kyphosis, and flexed trunk   PELVIC ALIGNMENT: even  LUMBARAROM/PROM: stiffness throughout other than flexion  A/PROM A/PROM  eval  Flexion 90%   LOWER EXTREMITY AROM/PROM: limited bilateral hip ER and IR and left hip flexion reproduces  pain into tip of penis   LOWER EXTREMITY MMT: 4/5 grossly throughout PALPATION: GENERAL abdominal trigger points and tightness reproduced tip of penis pain              External Perineal Exam- tight and tender bilateral bulbospongiosus              Internal Pelvic Floor mild tenderness throughout internal and external anal sphincter Patient confirms identification and approves PT to assess internal pelvic floor and treatment Yes  PELVIC MMT:   MMT eval 12/12/23  Internal Anal Sphincter 4/5 5/5  External Anal Sphincter 4/5 5/5  Puborectalis  5/5  Diastasis Recti 1 -1-1 finger, no doming   (Blank rows = not tested)  TONE: high  TODAY'S TREATMENT: 02/08/24 Manual: Soft tissue mobilization: Scar tissue mobilization: Myofascial release: Spinal mobilization: Internal pelvic floor techniques: Dry needling: Neuromuscular re-education: Core retraining: Core facilitation: Form correction: Pelvic floor contraction training: Supine with red band moving legs from 90/90 and opposite shoulder flexion 15 x each Bridge with criss crossed bands with bilateral shoulder flexion 20 x   Down training: Exercises: Stretches/mobility: Strengthening: Therapeutic activities: Functional strengthening activities: Self-care: Discussed with patient on the use of the penile pump, progressing himself, and how it works.     02/02/2024 There acts- review of HEP, progress, goals Manual- rectal release Neuro reed- diaphragmatic breathing  Back extensions over foam roller Open books bilateral 10 reps with block between knees Prayer stretch 3 reps , left and right stretch 3 reps/ 3 breaths       01/27/2024 There acts- review of HEP, progress Neurom reed- diaphragmatic breathing with TC's, VC's, Trial of pelvic floor exercise- hip adduction with ball with transverse abdominis breath and horizontal abduction  with green and red thera band -10 reps- tiring Hip adduction with block 10 reps with transverse abdominis breath supine  Seated hip adduction with yoga block with transverse abdominis breath 10 reps  Seated hamstring stretch bilateral 2 reps Child's pose with diaphragmatic breathing    01/23/24 Manual: Soft tissue mobilization: Release of the urogenital diaphragm going through the layers and no tenderness.  Myofascial release: Fascial release around the round ligament bilaterally Fascial release of the mons pubis and urethra together Fascial release of the area around the bladder.  Self-care: Educated patient on how to use a penile pump. Different places he is to purchase one, what to look for in a pump     01/19/2024 Neuro reed- open books with foam roller, child's pose 1 min  Foam roller stretch from child's pose 10 reps                    Seated back stretches and side stretch 10 reps with foam roller Cat/ cow 10 reps with diaphragmatic breathing Figure four stretch bilateral 5 reps with DB                     Manual- rectal work for pelvic floor release  There acts- review of progress, HEP, self cathing      01/13/2024 Patient  confirms identification and approves physical therapist to perform internal soft tissue work    Neuro reeducation-open books 10 reps bilateral                                 Diaphragmatic breathing is supine and sidelying  Manual therapy- rectal pelvic floor mobilization, pelvic floor release with diaphragmatic breathing    Therapeutic activities- education on bladder diary, bladder irritants, weaning of catheter strategies, progress review and goals     PATIENT EDUCATION/ there acts  02/08/24 Education details:  Access Code: [email or web address], educated on how to relax pelvic floor to urinate, sit to stand with upright posture, reviewed how to use the penile pump and progress himself Person educated: Patient and Spouse Education method: Explanation, Demonstration, Tactile cues, Verbal cues, and Handouts Education comprehension: verbalized understanding, returned demonstration, verbal cues required, tactile cues required, and needs further education  HOME EXERCISE PROGRAM: 02/08/24 Access Code: [email or web address] URL: [email or web address] Date: 02/08/2024 Prepared by: Channing Pereyra  Exercises - Supine Dead Bug with Leg Extension  - 1 x daily - 2 x weekly - 2 sets - 10 reps - Bridge with Arms Raised  - 1 x daily - 2 x weekly - 1 sets - 20 reps - Quadruped Pelvic Floor Contraction with Opposite Arm and Leg Lift  - 1 x daily - 2 x weekly - 2 sets - 10 reps  Patient Education - Urinary Urge Control Techniques - Urinary Urge Control Techniques  ASSESSMENT:  CLINICAL IMPRESSION: Patient is not having pain. He has not had urinary leakage for several weeks. He is using the penile pump and has moved up to the larger size.  His pelvic floor strength is 5/5. He is able to wear 1 pad for just in case due to them being dry. He is independent with HEP. No issues with bowel movements. He understands the urge suppression drill for during night. PFIQ-7  score went from 67 to 6 due to improvement.       OBJECTIVE IMPAIRMENTS: Abnormal gait, decreased activity tolerance, decreased coordination, decreased endurance, difficulty walking, decreased ROM, decreased strength, increased fascial restrictions, increased muscle spasms, impaired UE functional use, and pain.   ACTIVITY LIMITATIONS: continence and toileting  PARTICIPATION LIMITATIONS: interpersonal relationship and community activity  PERSONAL FACTORS: Age, Fitness, and Time since onset of injury/illness/exacerbation are also affecting patient's functional outcome.   REHAB POTENTIAL: Good  CLINICAL DECISION MAKING: Evolving/moderate complexity  EVALUATION COMPLEXITY: Moderate   GOALS: Goals reviewed with patient? Yes  SHORT TERM GOALS: Target date: 12/12/2023    Pt will be independent with HEP.   Baseline: Goal status: Met 12/12/23  2. Pt will be independent with the knack, urge suppression technique, and double voiding in order to improve bladder habits and decrease urinary incontinence.   Baseline:  Goal status: Met 02/08/24  3.  Pt will be independent with use  of squatty potty, relaxed toileting mechanics, and improved bowel movement techniques in order to increase ease of bowel movements and complete evacuation.   Baseline:  Goal status: Met 02/08/24  4.  Pt will demonstrate at least 45 mins of exercise without increased back and pelvic pain Baseline:  Goal status: met 02/08/24    LONG TERM GOALS: Target date: 02/06/2024    Pt will be independent with advanced HEP.   Baseline:  Goal status: Met 02/08/24  2.  Pt will report max 2/10 pelvic pain in order to have better quality of life Baseline:  Goal status: Met 02/08/24  3.  Pt will soak 0 pads/ day in order to participate in community and church without embarassement Baseline: 2-3 pads Goal status: met 02/08/24  4.  Pt will report 0 bladder spasms in 24 hrs  Baseline:  Goal status: met 02/08/24  5.  Pt will get up 1 time/ night to urinate in order to have  increased energy during the day Baseline: 2-3 Goal status: Met 02/08/24    PLAN:Discharge to HEP this visit   Channing Pereyra, PT 02/08/24 4:11 PM  PHYSICAL THERAPY DISCHARGE SUMMARY  Visits from Start of Care: 15  Current functional level related to goals / functional outcomes: See above   Remaining deficits: See above.    Education / Equipment: HEP   Patient agrees to discharge. Patient goals were met. Patient is being discharged due to meeting the stated rehab goals. Thank you for the referral.   Channing Pereyra, PT 02/08/24 4:11 PM'

## 2024-02-13 ENCOUNTER — Ambulatory Visit: Admitting: Physical Therapy

## 2024-02-13 ENCOUNTER — Encounter: Payer: Self-pay | Admitting: Physical Therapy

## 2024-02-14 DIAGNOSIS — M47816 Spondylosis without myelopathy or radiculopathy, lumbar region: Secondary | ICD-10-CM | POA: Diagnosis not present

## 2024-02-21 DIAGNOSIS — M47816 Spondylosis without myelopathy or radiculopathy, lumbar region: Secondary | ICD-10-CM | POA: Diagnosis not present

## 2024-03-30 DIAGNOSIS — M47816 Spondylosis without myelopathy or radiculopathy, lumbar region: Secondary | ICD-10-CM | POA: Diagnosis not present

## 2024-03-30 DIAGNOSIS — M546 Pain in thoracic spine: Secondary | ICD-10-CM | POA: Diagnosis not present

## 2024-04-06 DIAGNOSIS — Z23 Encounter for immunization: Secondary | ICD-10-CM | POA: Diagnosis not present

## 2024-04-20 NOTE — Progress Notes (Unsigned)
 CARDIOLOGY CONSULT NOTE       Patient ID: Ryan [name] MRN: [id number] DOB/AGE: 126-Jan-1947 78 y.o.   Referring Physician: Beverley Primary Physician: Micheal Wolm ORN, MD Primary Cardiologist: Delford Reason for Consultation: Tachycardia   HPI:  78 y.o. referred by Dr Aaoron for tachycardia. First seen by me 08/04/22 Patient has noted elevated HRls for a few months. He works out at gym and notes HR can remain elevated for 2-4 hours. Zio monitor ordered by primary reviewed and no dangerous arrhythmias. Some SVT but longest only 11 seconds and ? Atrial tachycardia. TTE done 06/09/22 showed normal EF and mild AR. ETT showed no pre excitation and no ischemia. He is retired from [employer] where he met his current wife.  He sees Dr Matias but gets a lot of care at [facility] He is retired Retail banker. Denies chest pain dyspnea, or syncope Active at gym and doing yard work   Concerned about beta blockers and EF Was started on lopressor    Coronary calcium  score 267 which was 54 th percentile for age/sex Has some myalgias with lipitor tolerating 40 mg LDL was 94 07/22/23 needs f/u labs on higher dose  Still working out at [employer] Can get HR up to 148 but baseline nice at 67. He has 3 children and wife had two when they met all are fairly local   Prostate cancer. Rx brachyRx July 2024 Urinary retention with need for foley and Pseudomonas infection Rx Cipro  Using flomax ,oxybutynin  and Pyridium  Switched from [facility] to Dr Nicholaus with [facility] and had adhesions lysed   His LDL is 87 which I think is fine given Rx only for elevated calcium  score.   Frustrated at getting f/u with [facility] now seeing [facility]   ROS All other systems reviewed and negative except as noted above  Past Medical History:  Diagnosis Date   Arthritis 03/28/2013   Back pain    Cancer (HCC) 2014   prostate cancer   Cataracts, bilateral    Dysrhythmia    occasional PAC- asymptomatic,  per MD pt cut back on caffeine   ED (erectile dysfunction) 12/18/2021   due to arterial insufficency   Fungal nail infection    GERD (gastroesophageal reflux disease)    Hearing loss    wear hearing aids   HSV infection 2016   Hyperlipidemia    Hypertension    no meds, dx yrs ago per patient but normal last several yrs   Hypertensive retinopathy    OU   Hypothyroidism    Personal history of other diseases of the digestive system    Personal history of other diseases of the nervous system and sense organs 2014   Personal history of other endocrine, nutritional and metabolic disease 7985   Pneumonia    x 1   Primary testicular hypogonadism 2017   Retarded ejaculation 2021   Sleep apnea 2014   history of, did not qualify for cpap    Family History  Problem Relation Age of Onset   Heart disease Mother    Liver disease Father    Alcohol  abuse Father    Cancer Father        hepatic cancer ?primary   Arthritis Sister    COPD Sister     Social History   Socioeconomic History   Marital status: Married    Spouse name: Not on file   Number of children: Not on file   Years of education: Not on file  Highest education level: Associate degree: occupational, Scientist, product/process development, or vocational program  Occupational History   Not on file  Tobacco Use   Smoking status: Former    Current packs/day: 0.00    Types: Cigarettes    Quit date: 07/05/1981    Years since quitting: 42.8   Smokeless tobacco: Never  Vaping Use   Vaping status: Never Used  Substance and Sexual Activity   Alcohol  use: Yes    Alcohol /week: 3.0 - 4.0 standard drinks of alcohol     Types: 3 - 4 Glasses of wine per week   Drug use: No   Sexual activity: Not on file  Other Topics Concern   Not on file  Social History Narrative   Not on file   Social Drivers of Health   Financial Resource Strain: Low Risk  (01/15/2024)   Received from [facility]   Overall Financial Resource Strain (CARDIA)    How hard is it for  you to pay for the very basics like food, housing, medical care, and heating?: Not hard at all  Food Insecurity: No Food Insecurity (01/15/2024)   Received from [facility]   Hunger Vital Sign    Within the past 12 months, you worried that your food would run out before you got the money to buy more.: Never true    Within the past 12 months, the food you bought just didn't last and you didn't have money to get more.: Never true  Transportation Needs: No Transportation Needs (01/15/2024)   Received from [facility] - Transportation    In the past 12 months, has lack of transportation kept you from medical appointments or from getting medications?: No    In the past 12 months, has lack of transportation kept you from meetings, work, or from getting things needed for daily living?: No  Physical Activity: Sufficiently Active (01/15/2024)   Received from [facility]   Exercise Vital Sign    On average, how many days per week do you engage in moderate to strenuous exercise (like a brisk walk)?: 3 days    On average, how many minutes do you engage in exercise at this level?: 60 min  Recent Concern: Physical Activity - Inactive (11/21/2023)   Exercise Vital Sign    Days of Exercise per Week: 0 days    Minutes of Exercise per Session: 90 min  Stress: No Stress Concern Present (01/15/2024)   Received from [facility] - Occupational Stress Questionnaire    Do you feel stress - tense, restless, nervous, or anxious, or unable to sleep at night because your mind is troubled all the time - these days?: Not at all  Social Connections: Moderately Integrated (01/15/2024)   Received from [facility]   Social Network    How would you rate your social network (family, work, friends)?: Adequate participation with social networks  Intimate Partner Violence: Not At Risk (01/15/2024)   Received from [facility]   HITS    Over the last 12 months how  often did your partner physically hurt you?: Never    Over the last 12 months how often did your partner insult you or talk down to you?: Never    Over the last 12 months how often did your partner threaten you with physical harm?: Never    Over the last 12 months how often did your partner scream or curse at you?: Never    Past Surgical History:  Procedure Laterality Date   ankle surgery trauma Right    CATARACT EXTRACTION Left 11/29/2018   Dr. Roz   COLONOSCOPY     COSMETIC SURGERY     loose skin removed around neck area   CYSTOSCOPY N/A 01/31/2023   Procedure: CYSTOSCOPY;  Surgeon: Selma Donnice SAUNDERS, MD;  Location[facility];  Service: Urology;  Laterality: N/A;   eye lid surgery     EYE SURGERY     fungal nail     LASIK Bilateral    PARS PLANA VITRECTOMY Left 03/01/2019   Procedure: PARS PLANA VITRECTOMY WITH 25 GAUGE;  Surgeon: Valdemar Rogue, MD;  Location: [facility] OR;  Service: Ophthalmology;  Laterality: Left;   PROSTATE BIOPSY  06/21/2022   2020, 2018   RADIOACTIVE SEED IMPLANT N/A 01/31/2023   Procedure: RADIOACTIVE SEED IMPLANT/BRACHYTHERAPY IMPLANT;  Surgeon: Selma Donnice SAUNDERS, MD;  Location: [facility];  Service: Urology;  Laterality: N/A;  90 MINUTES NEEDED FOR CASE   REMOVAL RETAINED LENS Left 03/01/2019   Procedure: REMOVAL OF RETAINED LENS MATERIALS LEFT EYE;  Surgeon: Valdemar Rogue, MD;  Location: [facility] OR;  Service: Ophthalmology;  Laterality: Left;   SPACE OAR INSTILLATION N/A 01/31/2023   Procedure: SPACE OAR INSTILLATION;  Surgeon: Selma Donnice SAUNDERS, MD;  Location: [facility];  Service: Urology;  Laterality: N/A;   VASECTOMY  1981   WISDOM TOOTH EXTRACTION        Current Outpatient Medications:    ADVIL 200 MG tablet, Take 400 mg by mouth 2 (two) times daily as needed (for pain- rotating with 650 mg of EC aspirin)., Disp: , Rfl:    atorvastatin  (LIPITOR) 40 MG tablet, Take 1 tablet (40 mg total) by mouth daily. (Patient taking  differently: Take 40 mg by mouth at bedtime.), Disp: 90 tablet, Rfl: 3   EPINEPHrine  0.3 mg/0.3 mL IJ SOAJ injection, Inject 0.3 mg into the muscle as needed for anaphylaxis (AS DIRECTED)., Disp: , Rfl:    fluticasone (FLONASE) 50 MCG/ACT nasal spray, Place 1 spray into both nostrils daily as needed (FOR SEASONAL ALLERGIES)., Disp: , Rfl:    gabapentin  (NEURONTIN ) 300 MG capsule, Take 300 mg by mouth 3 (three) times daily., Disp: , Rfl:    levothyroxine  (SYNTHROID ) 50 MCG tablet, Take 50 mcg by mouth daily before breakfast., Disp: , Rfl:    loratadine (CLARITIN) 10 MG tablet, Take 10 mg by mouth daily as needed for allergies. , Disp: , Rfl:    metoprolol  tartrate (LOPRESSOR ) 25 MG tablet, TAKE 1 TABLET BY MOUTH TWICE A DAY, Disp: 180 tablet, Rfl: 3   pantoprazole  (PROTONIX ) 40 MG tablet, TAKE 1 TABLET BY MOUTH EVERY DAY (Patient taking differently: Take 40 mg by mouth See admin instructions. Take 40 mg by mouth MIDDAY), Disp: 90 tablet, Rfl: 1   RESTASIS  0.05 % ophthalmic emulsion, Place 1 drop into both eyes 2 (two) times daily., Disp: , Rfl:    tamsulosin  (FLOMAX ) 0.4 MG CAPS capsule, Take 1 capsule (0.4 mg total) by mouth daily. (Patient taking differently: Take 0.4 mg by mouth 2 (two) times daily.), Disp: 30 capsule, Rfl: 0    Physical Exam: BP (!) 145/75 (BP Location: Left Arm, Patient Position: Sitting, Cuff Size: Normal)   Pulse 62   Resp 16   Ht 5' 8 (1.727 m)   Wt 163 lb 9.6 oz (74.2 kg)   SpO2 98%   BMI 24.88 kg/m    Affect appropriate Healthy:  appears stated age HEENT: hearing  aids in place  Neck supple with no adenopathy JVP normal no bruits no thyromegaly Lungs clear with no wheezing and good diaphragmatic motion Heart:  S1/S2 no murmur, no rub, gallop or click PMI normal Abdomen: benighn, BS positve, no tenderness, no AAA no bruit.  No HSM or HJR Distal pulses intact with no bruits No edema Neuro non-focal Skin warm and dry No muscular weakness   Labs:   Lab  Results  Component Value Date   WBC 5.5 08/07/2023   HGB 11.2 (L) 08/07/2023   HCT 34.3 (L) 08/07/2023   MCV 88.4 08/07/2023   PLT 132 (L) 08/07/2023   No results for input(s): NA, K, CL, CO2, BUN, CREATININE, CALCIUM , PROT, BILITOT, ALKPHOS, ALT, AST, GLUCOSE in the last 168 hours.  Invalid input(s): LABALBU No results found for: CKTOTAL, CKMB, CKMBINDEX, TROPONINI  Lab Results  Component Value Date   CHOL 173 07/22/2023   CHOL 197 11/04/2022   CHOL 193 08/22/2020   Lab Results  Component Value Date   HDL 64 07/22/2023   HDL 68 11/04/2022   HDL 77 (A) 08/22/2020   Lab Results  Component Value Date   LDLCALC 94 07/22/2023   LDLCALC 110 (H) 11/04/2022   LDLCALC 90 08/22/2020   Lab Results  Component Value Date   TRIG 78 07/22/2023   TRIG 107 11/04/2022   TRIG 130 08/22/2020   Lab Results  Component Value Date   CHOLHDL 2.7 07/22/2023   CHOLHDL 2.9 11/04/2022   CHOLHDL 3 03/01/2018   Lab Results  Component Value Date   LDLDIRECT 122.5 07/02/2013   LDLDIRECT 122.8 01/01/2013   LDLDIRECT 173.8 04/28/2012      Radiology: No results found.  EKG: NSR normal    ASSESSMENT AND PLAN:   Tachcyardia:  benign monitor with self limited SVT/atrial tachycardia. Continue lopressor  TE with normal EF and normal ETT HLD:  on statin high calcium  score for age on lipitor 40 mg update labs  Synthroid :  on synthroid  replacement f/u labs with primary  Prostate:  cancer post brachyRx complicated by retention and infection  F/U with  [facility] now Continue flomax , Myrbetriq and gabapentin  at night    F/U in a year   Signed: Maude Emmer 04/26/2024, 10:41 AM

## 2024-04-26 ENCOUNTER — Ambulatory Visit: Payer: Self-pay | Attending: Internal Medicine | Admitting: Cardiovascular Disease

## 2024-04-26 ENCOUNTER — Encounter: Payer: Self-pay | Admitting: Cardiovascular Disease

## 2024-04-26 VITALS — BP 145/75 | HR 62 | Resp 16 | Ht 68.0 in | Wt 163.6 lb

## 2024-04-26 DIAGNOSIS — E785 Hyperlipidemia, unspecified: Secondary | ICD-10-CM | POA: Diagnosis present

## 2024-04-26 DIAGNOSIS — I471 Supraventricular tachycardia, unspecified: Secondary | ICD-10-CM | POA: Insufficient documentation

## 2024-04-26 DIAGNOSIS — I10 Essential (primary) hypertension: Secondary | ICD-10-CM | POA: Insufficient documentation

## 2024-04-26 NOTE — Patient Instructions (Addendum)
 Medication Instructions:   No changes *If you need a refill on your cardiac medications before your next appointment, please call your pharmacy*   Lab Work: Not needed If you have labs (blood work) drawn today and your tests are completely normal, you will receive your results only by: MyChart Message (if you have MyChart) OR A paper copy in the mail If you have any lab test that is abnormal or we need to change your treatment, we will call you to review the results.   Testing/Procedures: Not needed   Follow-Up: At [facility], you and your health needs are our priority.  As part of our continuing mission to provide you with exceptional heart care, we have created designated Provider Care Teams.  These Care Teams include your primary Cardiologist (physician) and Advanced Practice Providers (APPs -  Physician Assistants and Nurse Practitioners) who all work together to provide you with the care you need, when you need it.     Your next appointment:   12 month(s)  The format for your next appointment:   In Person  Provider:   Maude Emmer, MD

## 2024-05-01 DIAGNOSIS — R3129 Other microscopic hematuria: Secondary | ICD-10-CM | POA: Diagnosis not present

## 2024-05-01 DIAGNOSIS — N400 Enlarged prostate without lower urinary tract symptoms: Secondary | ICD-10-CM | POA: Diagnosis not present

## 2024-06-30 ENCOUNTER — Other Ambulatory Visit: Payer: Self-pay | Admitting: Cardiovascular Disease
